# Patient Record
Sex: Male | Born: 1960
Health system: Southern US, Community
[De-identification: ages and names within clinical notes are randomized; demographics above are authoritative.]

## PROBLEM LIST (undated history)

## (undated) DIAGNOSIS — I509 Heart failure, unspecified: Secondary | ICD-10-CM

## (undated) DIAGNOSIS — I2510 Atherosclerotic heart disease of native coronary artery without angina pectoris: Secondary | ICD-10-CM

## (undated) DIAGNOSIS — R739 Hyperglycemia, unspecified: Secondary | ICD-10-CM

## (undated) DIAGNOSIS — I10 Essential (primary) hypertension: Secondary | ICD-10-CM

## (undated) DIAGNOSIS — N182 Chronic kidney disease, stage 2 (mild): Secondary | ICD-10-CM

## (undated) DIAGNOSIS — I82409 Acute embolism and thrombosis of unspecified deep veins of unspecified lower extremity: Secondary | ICD-10-CM

## (undated) DIAGNOSIS — E7800 Pure hypercholesterolemia, unspecified: Secondary | ICD-10-CM

## (undated) HISTORY — DX: Acute embolism and thrombosis of unspecified deep veins of unspecified lower extremity: I82.409

## (undated) HISTORY — DX: Heart failure, unspecified: I50.9

---

## 1979-05-22 DIAGNOSIS — I82409 Acute embolism and thrombosis of unspecified deep veins of unspecified lower extremity: Secondary | ICD-10-CM

## 1979-05-22 HISTORY — DX: Acute embolism and thrombosis of unspecified deep veins of unspecified lower extremity: I82.409

## 1997-09-10 ENCOUNTER — Encounter: Admission: RE | Admit: 1997-09-10 | Discharge: 1997-09-10 | Payer: Self-pay | Admitting: Internal Medicine

## 1998-10-18 ENCOUNTER — Encounter: Admission: RE | Admit: 1998-10-18 | Discharge: 1998-10-18 | Payer: Self-pay | Admitting: Internal Medicine

## 1999-05-11 ENCOUNTER — Encounter: Admission: RE | Admit: 1999-05-11 | Discharge: 1999-05-11 | Payer: Self-pay | Admitting: Internal Medicine

## 2007-04-10 ENCOUNTER — Ambulatory Visit (HOSPITAL_BASED_OUTPATIENT_CLINIC_OR_DEPARTMENT_OTHER): Admission: RE | Admit: 2007-04-10 | Discharge: 2007-04-10 | Payer: Self-pay | Admitting: Orthopedic Surgery

## 2007-04-10 ENCOUNTER — Encounter (INDEPENDENT_AMBULATORY_CARE_PROVIDER_SITE_OTHER): Payer: Self-pay | Admitting: Orthopedic Surgery

## 2007-05-22 HISTORY — PX: KNEE SURGERY: SHX244

## 2009-03-22 ENCOUNTER — Ambulatory Visit: Payer: Self-pay | Admitting: Family Medicine

## 2009-05-19 ENCOUNTER — Ambulatory Visit: Payer: Self-pay | Admitting: Family Medicine

## 2009-05-19 DIAGNOSIS — E785 Hyperlipidemia, unspecified: Secondary | ICD-10-CM | POA: Insufficient documentation

## 2009-05-19 DIAGNOSIS — M2170 Unequal limb length (acquired), unspecified site: Secondary | ICD-10-CM

## 2009-05-19 DIAGNOSIS — F17200 Nicotine dependence, unspecified, uncomplicated: Secondary | ICD-10-CM | POA: Insufficient documentation

## 2009-05-19 DIAGNOSIS — F79 Unspecified intellectual disabilities: Secondary | ICD-10-CM

## 2009-05-19 DIAGNOSIS — F411 Generalized anxiety disorder: Secondary | ICD-10-CM

## 2009-05-19 DIAGNOSIS — I10 Essential (primary) hypertension: Secondary | ICD-10-CM | POA: Insufficient documentation

## 2009-05-19 DIAGNOSIS — F988 Other specified behavioral and emotional disorders with onset usually occurring in childhood and adolescence: Secondary | ICD-10-CM | POA: Insufficient documentation

## 2009-05-19 HISTORY — DX: Nicotine dependence, unspecified, uncomplicated: F17.200

## 2009-05-19 LAB — CONVERTED CEMR LAB
ALT: 30 units/L (ref 0–53)
AST: 28 units/L (ref 0–37)
Albumin: 3.9 g/dL (ref 3.5–5.2)
Eosinophils Relative: 1.5 % (ref 0.0–5.0)
GFR calc non Af Amer: 57.22 mL/min (ref >60)
Glucose, Bld: 103 mg/dL — ABNORMAL HIGH (ref 70–99)
HCT: 46.1 % (ref 39.0–52.0)
Hemoglobin: 15.5 g/dL (ref 13.0–17.0)
Lymphs Abs: 2.3 10*3/uL (ref 0.7–4.0)
Monocytes Relative: 7.4 % (ref 3.0–12.0)
Neutro Abs: 4 10*3/uL (ref 1.4–7.7)
Potassium: 4.4 meq/L (ref 3.5–5.1)
RDW: 12.1 % (ref 11.5–14.6)
Sodium: 140 meq/L (ref 135–145)
TSH: 3.05 microintl units/mL (ref 0.35–5.50)
Total Protein: 7.4 g/dL (ref 6.0–8.3)
VLDL: 23.4 mg/dL (ref 0.0–40.0)
WBC: 7 10*3/uL (ref 4.5–10.5)

## 2009-07-06 ENCOUNTER — Telehealth (INDEPENDENT_AMBULATORY_CARE_PROVIDER_SITE_OTHER): Payer: Self-pay | Admitting: *Deleted

## 2009-07-18 ENCOUNTER — Telehealth (INDEPENDENT_AMBULATORY_CARE_PROVIDER_SITE_OTHER): Payer: Self-pay | Admitting: *Deleted

## 2009-07-18 ENCOUNTER — Encounter (INDEPENDENT_AMBULATORY_CARE_PROVIDER_SITE_OTHER): Payer: Self-pay | Admitting: *Deleted

## 2009-12-26 ENCOUNTER — Encounter (INDEPENDENT_AMBULATORY_CARE_PROVIDER_SITE_OTHER): Payer: Self-pay | Admitting: *Deleted

## 2010-02-16 ENCOUNTER — Ambulatory Visit (HOSPITAL_BASED_OUTPATIENT_CLINIC_OR_DEPARTMENT_OTHER): Admission: RE | Admit: 2010-02-16 | Discharge: 2010-02-16 | Payer: Self-pay | Admitting: Orthopedic Surgery

## 2010-06-20 NOTE — Letter (Signed)
Summary: Discharge Letter  [place] at [facility]  [address] [place], [place] [address]   Phone: [phone]  Fax: [phone]       07/18/2009 MRN: [id number]  [place] [address] [place], [place]  [address]  Dear Mr. Brostrom,   I find it necessary to inform you that I will not be able to provide medical care to you, due to numerous cancelled appointments and inappropriate behavior towards staff.  Delivering timely, professional, and accurate health care is our goal and we are committed to maintaining these standards at our office.  Since your condition requires medical attention and you are dismissed from my personal practice and all physicians at [facility] at [facility], I suggest that you place your self under the care of another physician without delay. If you desire, I will be available for emergency care for 30 days after you receive this letter.  This should give you ample time to select a physician of your choice from the many competent providers in this area. You may want to call the local medical society or [facility]'s physician referral service [phone]) for their assistance in locating a new physician. With your written authorization, I will make a copy of your medical record available to your new physician.   Sincerely,    Laurita Quint, M.D.   Appended Document: Discharge Letter Notifications have been placed in IDX and EMR to reflect Dismissal. Letter sent out by certified mail.  Appended Document: Discharge Letter Receipt returned by USPS verifying delivery of Certified letter. Signed by patient on 07/20/09.

## 2010-06-20 NOTE — Letter (Signed)
Summary: Nadara Eaton letter  [place] at [facility]  [address] [place], [place] [address]   Phone: [phone]  Fax: [phone]       12/26/2009 MRN: [id number]  [place] [address] [place], [place]  [address]  Dear Mr. [facility] [place], and [place] announce the retirement of Arta Silence, M.D., from full-time practice at the [facility] office effective November 17, 2009 and his plans of returning part-time.  It is important to Dr. Hetty Ely and to our practice that you understand that [facility] has seven physicians in our office for your health care needs.  We will continue to offer the same exceptional care that you have today.    Dr. Hetty Ely has spoken to many of you about his plans for retirement and returning part-time in the fall.   We will continue to work with you through the transition to schedule appointments for you in the office and meet the high standards that [place] is committed to.   Again, it is with great pleasure that we share the news that Dr. Hetty Ely will return to [facility] in October of 2011 with a reduced schedule.    If you have any questions, or would like to request an appointment with one of our physicians, please call us at [phone] and press the option for Scheduling an appointment.  We take pleasure in providing you with excellent patient care and look forward to seeing you at your next office visit.  Our [facility] Physicians are:  Tillman Abide, M.D. Laurita Quint, M.D. Roxy Manns, M.D. Kerby Nora, M.D. Hannah Beat, M.D. Ruthe Mannan, M.D. We proudly welcomed Raechel Ache, M.D. and Eustaquio Boyden, M.D. to the practice in July/August 2011.  Sincerely,  [place] [facility]

## 2010-06-20 NOTE — Progress Notes (Signed)
Summary: Dismissal Info  Phone Note Other Incoming   Caller: Clarisa Schools Details for Reason: Pt being dismissed from Ambulance person of Call: Pt was scheduled for appts several times and cancelled.  On one occassion, patient was in the office and was yelling and inappropriate with office staff.  Site manager heard the yelling and the patient being confrontational and stepped out of the back office to speak with the patient.  Patient was removed from front office and scheduling issue was discussed.  Patient was demanding an appointment on a day/time that was not available and it was explained to patient what days/times were available.  Patient's behavior was also inappropriate with the office manager and required extensive explanation of our scheduling guidelines and policy.  Manager talked with physician and offered an appointment and explained that physician stated that the patient could find another physician if this scheduling option was not suitable.   He declined the first available appointment that was offered within the same week due to it not accomodating his schedule and scheduled another appointment time on 1/4.   Patient has since cancelled a total of 3 times and on his most recent cancellation he was demanding and had inappropriate behavior over the telephone with an office staff member and the office team lead.  His behavior continued to escalate and the office team lead had to dismiss the call and reported the behavior to the manager and physician.  The patient called back and demanded his appointment time back.  He claimed he had not called the office and cancelled the appointment and he threatened to come to the office for the appointment time although it had been explained to the patient that the appointment time had already been rescheduled for another patient after he had called earlier and cancelled his original visit time.  Due to the escalated behavior and threats, law enforcement  was called and on site. Protocol has been followed to dismiss the patient from [facility] at [facility].  Initial call taken by: Clarisa Schools,  July 18, 2009 4:30 PM

## 2010-06-20 NOTE — Progress Notes (Signed)
Summary: Pt cancelled appt 3x cpx   Phone Note Call from Patient   Caller: Patient Summary of Call: [name] has scheduled 3 cpx appts and has cancelled each one of them on the same day as the appointments. Pt calls are  always  rude and persistent w/ his request. Pt called on Monday, 07-04-2009, cancelled and cpx appt.  I discussed the no show charge w/ pt, he became very upset. "stated he will go somewhere else before he pay a no show fee".  Pt says the appt was originally scheduede by [employer], pts ins. Appt was rescheduled to 07-06-2009.Marland KitchenPt called today and cancelled again and requested  another appt in April. FYI to Dr. Hetty Ely.Daine Gip  July 06, 2009 12:40 PM  Initial call taken by: Daine Gip,  July 06, 2009 12:40 PM  Follow-up for Phone Call        I will not continue to allow this pt to monopolize my chart and then cancel at the last minute. This pt is no longer welcome in my practice. In addition due to his rude and abusive behavior, he will not be permitted to see anyone else in this office. Follow-up by: Shaune Leeks MD,  July 06, 2009 2:04 PM  Additional Follow-up for Phone Call Additional follow up Details #1::        In the process of Discharging pt per Dr. Hetty Ely request..Daine Gip  July 06, 2009 3:17 PM  Additional Follow-up by: Daine Gip,  July 06, 2009 3:17 PM

## 2010-08-03 LAB — POCT HEMOGLOBIN-HEMACUE: Hemoglobin: 15.2 g/dL (ref 13.0–17.0)

## 2010-08-03 LAB — BASIC METABOLIC PANEL
BUN: 14 mg/dL (ref 6–23)
Creatinine, Ser: 1.22 mg/dL (ref 0.4–1.5)
GFR calc non Af Amer: >60 mL/min (ref >60)

## 2010-10-03 NOTE — Op Note (Signed)
Lonnie Skinner, [place] NO.:  [id number]   MEDICAL RECORD NO.:  [id number]          PATIENT TYPE:  AMB   LOCATION:  DSC                          FACILITY:  [facility]   PHYSICIAN:  Loreta Ave, M.D. DATE OF BIRTH:  [birth year]   DATE OF PROCEDURE:  04/10/2007  DATE OF DISCHARGE:                               OPERATIVE REPORT   PREOPERATIVE DIAGNOSIS:  Left knee anterior meniscal cyst with  underlying lateral meniscus tear.   POSTOPERATIVE DIAGNOSIS:  Left knee anterior meniscal cyst with  underlying lateral meniscus tear with an extremely large extra-articular  cyst extending anterolaterally at the front of the knee and measuring 5-  7 cm in diameter.   PROCEDURE:  Left knee exam under anesthesia, arthroscopy, partial  lateral meniscectomy, open and arthroscopic excision of the meniscal  cyst.   SURGEON:  Loreta Ave, M.D.   ASSISTANT:  Genene Churn. Barry Dienes, P.A.-C.   ANESTHESIA:  General   BLOOD LOSS:  Minimal.   TOURNIQUET TIME:  45 minutes.   SPECIMENS:  None.   CULTURES:  None.   COMPLICATIONS:  None.   DRESSING:  Soft compressive.   DESCRIPTION OF PROCEDURE:  The patient was brought to the operating room  and placed on the operating table in a supine position. After adequate  anesthesia had been obtained, left knee examined.  Full motion with full  extension and full flexion.  There was a very large very palpable cyst  just adjacent to the tibial tubercle off the front of the anterolateral  joint line.  This extended well down over the front of the tibia.  Attention was first turned to the extra-articular component of the cyst.  A longitudinal incision right over the cyst.  Skin and subcutaneous  tissue divided.  Care taken to avoid the anterior compartment and  peroneal nerve.  The cyst was a dumbbell shaped cyst extending out from  the front of the lateral compartment going distally and then wrapping  around the margin of the patellar tendon  deep and superficially.  This  was identified, excised in its entirety, traveling all the way down to  the anterolateral aspect of the knee where it communicated to the front  of the lateral meniscus.  After the entire portion of that cyst was  excised, I made three portals in the knee, one superolateral and one  each medial and lateral parapatellar.  Inflow catheter induced, the knee  distended, the arthroscope induced, the knee inspected.  Articular  cartilage intact patellofemoral joint.  Some grade 2 changes medial  component.  Medial meniscus intact.  Cruciate ligaments intact.  Anterior horn medial meniscus absolutely normal.  Anterolateral meniscus  extensive complex tearing with still some remnants of the cyst within  the knee itself.  There was really nothing salvageable over the entire  anterior half of the lateral meniscus.  Entire anterior half removed,  tapered to meniscus, salvaging the posterior half.  I could put a spinal  needle into the cyst excision directly into this site.  Care was taken  to remove all of the  remnants of the cyst both intra and extra-  articular.  The entire knee examined and no other the findings  appreciated.  Instruments and fluid removed.  I then went back to the  incision and did a closure with Vicryl of the opening where the cyst had  come out anterolaterally.  When that was complete, the portals were  closed with nylon.  The incision was closed with subcutaneous and  subcuticular Vicryl.  Steri-Strips applied.  Sterile compressive  dressing applied.  Tourniquet deflated and removed.  Anesthesia  reversed.  Brought to the recovery room. Tolerated the surgery well, no  complications.      Loreta Ave, M.D.  Electronically Signed     DFM/MEDQ  D:  04/10/2007  T:  04/10/2007  Job:  [id number]

## 2011-02-27 LAB — BASIC METABOLIC PANEL
BUN: 20
Creatinine, Ser: 1.31
GFR calc non Af Amer: 59 — ABNORMAL LOW

## 2011-02-27 LAB — POCT HEMOGLOBIN-HEMACUE: Operator id: 12362

## 2013-05-21 HISTORY — PX: CORONARY STENT PLACEMENT: SHX1402

## 2013-05-21 HISTORY — PX: CORONARY/GRAFT ACUTE MI REVASCULARIZATION: CATH118305

## 2013-06-01 ENCOUNTER — Emergency Department (HOSPITAL_COMMUNITY): Payer: Medicare Other

## 2013-06-01 ENCOUNTER — Encounter (HOSPITAL_COMMUNITY): Payer: Self-pay | Admitting: Emergency Medicine

## 2013-06-01 ENCOUNTER — Inpatient Hospital Stay (HOSPITAL_COMMUNITY)
Admission: EM | Admit: 2013-06-01 | Discharge: 2013-06-03 | DRG: 247 | Disposition: A | Discharge status: Home / Self Care | Payer: Medicare Other | Attending: Internal Medicine | Admitting: Internal Medicine

## 2013-06-01 DIAGNOSIS — N183 Chronic kidney disease, stage 3 unspecified: Secondary | ICD-10-CM | POA: Diagnosis present

## 2013-06-01 DIAGNOSIS — I214 Non-ST elevation (NSTEMI) myocardial infarction: Principal | ICD-10-CM

## 2013-06-01 DIAGNOSIS — I2510 Atherosclerotic heart disease of native coronary artery without angina pectoris: Secondary | ICD-10-CM

## 2013-06-01 DIAGNOSIS — N182 Chronic kidney disease, stage 2 (mild): Secondary | ICD-10-CM

## 2013-06-01 DIAGNOSIS — E785 Hyperlipidemia, unspecified: Secondary | ICD-10-CM | POA: Diagnosis present

## 2013-06-01 DIAGNOSIS — Z955 Presence of coronary angioplasty implant and graft: Secondary | ICD-10-CM

## 2013-06-01 DIAGNOSIS — R079 Chest pain, unspecified: Secondary | ICD-10-CM

## 2013-06-01 DIAGNOSIS — Z7982 Long term (current) use of aspirin: Secondary | ICD-10-CM

## 2013-06-01 DIAGNOSIS — Z8249 Family history of ischemic heart disease and other diseases of the circulatory system: Secondary | ICD-10-CM

## 2013-06-01 DIAGNOSIS — E7800 Pure hypercholesterolemia, unspecified: Secondary | ICD-10-CM | POA: Diagnosis present

## 2013-06-01 DIAGNOSIS — R011 Cardiac murmur, unspecified: Secondary | ICD-10-CM | POA: Diagnosis present

## 2013-06-01 DIAGNOSIS — E876 Hypokalemia: Secondary | ICD-10-CM | POA: Diagnosis not present

## 2013-06-01 DIAGNOSIS — I2119 ST elevation (STEMI) myocardial infarction involving other coronary artery of inferior wall: Secondary | ICD-10-CM

## 2013-06-01 DIAGNOSIS — R7309 Other abnormal glucose: Secondary | ICD-10-CM | POA: Diagnosis present

## 2013-06-01 DIAGNOSIS — Z87891 Personal history of nicotine dependence: Secondary | ICD-10-CM

## 2013-06-01 DIAGNOSIS — F411 Generalized anxiety disorder: Secondary | ICD-10-CM | POA: Diagnosis present

## 2013-06-01 DIAGNOSIS — I10 Essential (primary) hypertension: Secondary | ICD-10-CM | POA: Diagnosis present

## 2013-06-01 DIAGNOSIS — I129 Hypertensive chronic kidney disease with stage 1 through stage 4 chronic kidney disease, or unspecified chronic kidney disease: Secondary | ICD-10-CM | POA: Diagnosis present

## 2013-06-01 DIAGNOSIS — F17200 Nicotine dependence, unspecified, uncomplicated: Secondary | ICD-10-CM | POA: Diagnosis present

## 2013-06-01 HISTORY — DX: Atherosclerotic heart disease of native coronary artery without angina pectoris: I25.10

## 2013-06-01 HISTORY — DX: Pure hypercholesterolemia, unspecified: E78.00

## 2013-06-01 HISTORY — DX: Essential (primary) hypertension: I10

## 2013-06-01 HISTORY — DX: Hyperglycemia, unspecified: R73.9

## 2013-06-01 HISTORY — DX: Chronic kidney disease, stage 2 (mild): N18.2

## 2013-06-01 LAB — PROTIME-INR
INR: 1.05 (ref 0.00–1.49)
PROTHROMBIN TIME: 13.5 s (ref 11.6–15.2)

## 2013-06-01 LAB — BASIC METABOLIC PANEL
BUN: 15 mg/dL (ref 6–23)
CO2: 26 mEq/L (ref 19–32)
Calcium: 9.3 mg/dL (ref 8.4–10.5)
Chloride: 100 mEq/L (ref 96–112)
Creatinine, Ser: 1.33 mg/dL (ref 0.50–1.35)
GFR calc Af Amer: 69 mL/min — ABNORMAL LOW (ref >90)
GFR calc non Af Amer: 60 mL/min — ABNORMAL LOW (ref >90)
Glucose, Bld: 120 mg/dL — ABNORMAL HIGH (ref 70–99)
Potassium: 3.5 mEq/L — ABNORMAL LOW (ref 3.7–5.3)
Sodium: 140 mEq/L (ref 137–147)

## 2013-06-01 LAB — URINALYSIS, ROUTINE W REFLEX MICROSCOPIC
Bilirubin Urine: NEGATIVE
GLUCOSE, UA: NEGATIVE mg/dL
HGB URINE DIPSTICK: NEGATIVE
KETONES UR: NEGATIVE mg/dL
LEUKOCYTES UA: NEGATIVE
Nitrite: NEGATIVE
PH: 7.5 (ref 5.0–8.0)
PROTEIN: NEGATIVE mg/dL
Specific Gravity, Urine: 1.013 (ref 1.005–1.030)
Urobilinogen, UA: 0.2 mg/dL (ref 0.0–1.0)

## 2013-06-01 LAB — CBC
HCT: 45.6 % (ref 39.0–52.0)
Hemoglobin: 16.5 g/dL (ref 13.0–17.0)
MCH: 31.6 pg (ref 26.0–34.0)
MCHC: 36.2 g/dL — ABNORMAL HIGH (ref 30.0–36.0)
MCV: 87.4 fL (ref 78.0–100.0)
Platelets: 256 10*3/uL (ref 150–400)
RBC: 5.22 MIL/uL (ref 4.22–5.81)
RDW: 13.1 % (ref 11.5–15.5)
WBC: 9.7 10*3/uL (ref 4.0–10.5)

## 2013-06-01 LAB — POCT I-STAT TROPONIN I: Troponin i, poc: 0.5 ng/mL (ref 0.00–0.08)

## 2013-06-01 LAB — TROPONIN I: Troponin I: 0.83 ng/mL (ref <0.30)

## 2013-06-01 LAB — APTT: aPTT: 28 seconds (ref 24–37)

## 2013-06-01 MED ORDER — POTASSIUM CHLORIDE CRYS ER 20 MEQ PO TBCR
40.0000 meq | EXTENDED_RELEASE_TABLET | Freq: Once | ORAL | Status: AC
Start: 2013-06-01 — End: 2013-06-01
  Administered 2013-06-01: 40 meq via ORAL
  Filled 2013-06-01: qty 2

## 2013-06-01 MED ORDER — HEPARIN BOLUS VIA INFUSION
4000.0000 [IU] | Freq: Once | INTRAVENOUS | Status: AC
  Administered 2013-06-01 (×2): 4000 [IU] via INTRAVENOUS
  Filled 2013-06-01: qty 4000

## 2013-06-01 MED ORDER — HEPARIN (PORCINE) IN NACL 100-0.45 UNIT/ML-% IJ SOLN
1100.0000 [IU]/h | INTRAMUSCULAR | Status: DC
  Administered 2013-06-01: 1100 [IU]/h via INTRAVENOUS
  Filled 2013-06-01 (×2): qty 250

## 2013-06-01 MED ORDER — ASPIRIN 81 MG PO CHEW
324.0000 mg | CHEWABLE_TABLET | Freq: Once | ORAL | Status: AC
  Administered 2013-06-01: 324 mg via ORAL
  Filled 2013-06-01: qty 4

## 2013-06-01 NOTE — ED Provider Notes (Signed)
CSN: [id number]     Arrival date & time 06/01/13  1704 History   First MD Initiated Contact with Patient 06/01/13 2026     Chief Complaint  Patient presents with  . Chest Pain   (Consider location/radiation/quality/duration/timing/severity/associated sxs/prior Treatment) HPI Lonnie Skinner is a 53 y.o. male who presents emergency department complaining of chest pain. Patient states that he has had intermittent chest pain for the last 5 days. He states he only feels the pain when he goes outside to the mailbox. He admits that he thinks that the cold air is when making his pain worse. He denies walking longer distances than to the mailbox the last 5 days so he is not sure if walking a certain distance makes his pain worse. He denies going anywhere out of the house recently. He states he has had associated shortness of breath with this pain but denies any dizziness, nausea, diaphoresis. Patient states that he has history of hypertension and hypercholesterolemia but he does not take his medications as prescribed. He states 4 days ago when he had chest pressure and it measured his blood pressure and was elevated so he decided to restart his medications then. [facility]. He states he had a stress test done but that was multiple years ago. He denies any current chest pain. He is unable to recall how long the pain lasts. He states it feels like pressure in her center chest. Pt states "I am not sure what it is, but something is just not right."   Past Medical History  Diagnosis Date  . Hypertension   . Hypercholesteremia    History reviewed. No pertinent past surgical history. History reviewed. No pertinent family history. History  Substance Use Topics  . Smoking status: Never Smoker   . Smokeless tobacco: Not on file  . Alcohol Use: No    Review of Systems  Constitutional: Negative for fever and chills.  Respiratory: Positive for chest  tightness and shortness of breath. Negative for cough.   Cardiovascular: Positive for chest pain. Negative for palpitations and leg swelling.  Gastrointestinal: Negative for nausea, vomiting, abdominal pain, diarrhea and abdominal distention.  Genitourinary: Negative for dysuria, urgency, frequency and hematuria.  Musculoskeletal: Negative for arthralgias, myalgias, neck pain and neck stiffness.  Skin: Negative for rash.  Allergic/Immunologic: Negative for immunocompromised state.  Neurological: Negative for dizziness, weakness, light-headedness, numbness and headaches.    Allergies  Review of patient's allergies indicates no known allergies.  Home Medications   Current Outpatient Rx  Name  Route  Sig  Dispense  Refill  . atenolol (TENORMIN) 50 MG tablet   Oral   Take 50 mg by mouth daily.         Marland Kitchen gemfibrozil (LOPID) 600 MG tablet   Oral   Take 600 mg by mouth 2 (two) times daily before a meal.         . hydrochlorothiazide (HYDRODIURIL) 25 MG tablet   Oral   Take 25 mg by mouth daily.          BP 163/97  Pulse 77  Temp(Src) 97.5 F (36.4 C) (Oral)  Resp 16  SpO2 98% Physical Exam  Nursing note and vitals reviewed. Constitutional: He is oriented to person, place, and time. He appears well-developed and well-nourished. No distress.  HENT:  Head: Normocephalic and atraumatic.  Eyes: Conjunctivae are normal.  Neck: Neck supple.  Cardiovascular: Normal rate, regular rhythm and normal  heart sounds.   Pulmonary/Chest: Effort normal. No respiratory distress. He has no wheezes. He has no rales.  Abdominal: Soft. Bowel sounds are normal. He exhibits no distension. There is no tenderness. There is no rebound.  Musculoskeletal: He exhibits no edema.  Neurological: He is alert and oriented to person, place, and time.  Skin: Skin is warm and dry.    ED Course  Procedures (including critical care time) Labs Review Labs Reviewed  CBC - Abnormal; Notable for the  following:    MCHC 36.2 (*)    All other components within normal limits  BASIC METABOLIC PANEL - Abnormal; Notable for the following:    Potassium 3.5 (*)    Glucose, Bld 120 (*)    GFR calc non Af Amer 60 (*)    GFR calc Af Amer 69 (*)    All other components within normal limits  POCT I-STAT TROPONIN I - Abnormal; Notable for the following:    Troponin i, poc 0.50 (*)    All other components within normal limits  TROPONIN I  PROTIME-INR  APTT   Imaging Review Dg Chest 2 View  06/01/2013   CLINICAL DATA:  Chest pain  EXAM: CHEST  2 VIEW  COMPARISON:  None available  FINDINGS: The cardiac and mediastinal silhouettes are within normal limits.  The lungs are normally inflated. A few scattered linear opacity seen within the lingula are most consistent with atelectasis. No airspace consolidation, pleural effusion, or pulmonary edema is identified. There is no pneumothorax.  No acute osseous abnormality identified.  IMPRESSION: Mild lingular atelectasis.  No acute cardiopulmonary process.   Electronically Signed   By: Jeannine Boga M.D.   On: 06/01/2013 18:57    EKG Interpretation   None       MDM   1. Chest pain     9:12 PM Troponin 0.5. Pt is chest pain free. Aspirin ordered, 325mg . Started on heparin bolus and drip per pharmacy.   Discussed with cardiology, will come by and see. Repeat main lab troponin sent.   Filed Vitals:   06/01/13 1713 06/01/13 2045 06/01/13 2058 06/01/13 2100  BP: 163/97 148/99 148/99 151/89  Pulse: 77 65 66 63  Temp: 97.5 F (36.4 C)     TempSrc: Oral     Resp: 16 18 20 15   SpO2: 98% 99% 99% 98%      Renold Genta, PA-C 06/01/13 2309

## 2013-06-01 NOTE — H&P (Signed)
History and Physical  Patient ID: Lonnie Skinner MRN: [id number], DOB: 01-19-1961 Date of Encounter: 06/01/2013, 10:01 PM Primary Physician: No primary provider on file. Primary Cardiologist: Nill   Chief Complaint: Precodial pain  Reason for Admission: Precordial pain   HPI:52 yr old Male with hx of HLD , HTN , FH of CAD, anxiety  here with new onset precordial chest pain   Pt states that about 2 days ago when he was walking in the yard when it was cold he noticed chest pain . He describes this as substernal pressure non radiating that resolved on its own with rest. Today it recurred with exertion in the yard when he was carrying trash outside and decided to come in.  Pt denies SOB , orthopnea, PND , LE edema , Syncope ,claudcation , focal weakness, or bleeding diathesis .  No prior cardiac workup  Is a Veteran and follows at the [place]  Denies smoking, not active at baseline  Not fully compliant with his BP and cholesterol medications  States that both mother and father had heart disease but does not know what age they were dx with CAD      Past Medical History  Diagnosis Date  . Hypertension   . Hypercholesteremia      Surgical History: History reviewed. No pertinent past surgical history.   Home Meds: Prior to Admission medications   Medication Sig Start Date End Date Taking? Authorizing Provider  atenolol (TENORMIN) 50 MG tablet Take 50 mg by mouth daily.   Yes Historical Provider, MD  gemfibrozil (LOPID) 600 MG tablet Take 600 mg by mouth 2 (two) times daily before a meal.   Yes Historical Provider, MD  hydrochlorothiazide (HYDRODIURIL) 25 MG tablet Take 25 mg by mouth daily.   Yes Historical Provider, MD    Allergies: No Known Allergies  History   Social History  . Marital Status: Married    Spouse Name: N/A    Number of Children: N/A  . Years of Education: N/A   Occupational History  . Not on file.   Social History Main Topics  . Smoking status: Never Smoker    . Smokeless tobacco: Not on file  . Alcohol Use: No  . Drug Use: No  . Sexual Activity: Not on file   Other Topics Concern  . Not on file   Social History Narrative  . No narrative on file     History reviewed. No pertinent family history.  Review of Systems:per HPI  Labs:   Lab Results  Component Value Date   WBC 9.7 06/01/2013   HGB 16.5 06/01/2013   HCT 45.6 06/01/2013   MCV 87.4 06/01/2013   PLT 256 06/01/2013    Recent Labs Lab 06/01/13 1718  NA 140  K 3.5*  CL 100  CO2 26  BUN 15  CREATININE 1.33  CALCIUM 9.3  GLUCOSE 120*    Recent Labs  06/01/13 2052  TROPONINI 0.83*   Lab Results  Component Value Date   CHOL 163 05/19/2009   HDL 36.90* 05/19/2009   LDLCALC 103* 05/19/2009   TRIG 117.0 05/19/2009   No results found for this basename: DDIMER    Radiology/Studies:  Dg Chest 2 View  06/01/2013   CLINICAL DATA:  Chest pain  EXAM: CHEST  2 VIEW  COMPARISON:  None available  FINDINGS: The cardiac and mediastinal silhouettes are within normal limits.  The lungs are normally inflated. A few scattered linear opacity seen within the lingula are  most consistent with atelectasis. No airspace consolidation, pleural effusion, or pulmonary edema is identified. There is no pneumothorax.  No acute osseous abnormality identified.  IMPRESSION: Mild lingular atelectasis.  No acute cardiopulmonary process.   Electronically Signed   By: Jeannine Boga M.D.   On: 06/01/2013 18:57     EKG:  NSR, half mm ST depression in V4-V6   Physical Exam: Blood pressure 151/89, pulse 63, temperature 97.5 F (36.4 C), temperature source Oral, resp. rate 15, SpO2 98.00%. General: Well developed, well nourished, in no acute distress. Head: Normocephalic, atraumatic, sclera non-icteric, no xanthomas, nares are without discharge.  Neck: Negative for carotid bruits. JVD not elevated. Lungs: Clear bilaterally to auscultation without wheezes, rales, or rhonchi. Breathing is  unlabored. Heart: RRR with S1 S2. No murmurs, rubs, or gallops appreciated. Abdomen: Soft, non-tender, non-distended with normoactive bowel sounds. No hepatomegaly. No rebound/guarding. No obvious abdominal masses. Msk:  Strength and tone appear normal for age. Extremities: No clubbing or cyanosis. No edema.  Distal pedal pulses are 2+ and equal bilaterally. Neuro: Alert and oriented X 3. No focal deficit. No facial asymmetry. Moves all extremities spontaneously. Psych:  Appears anxious     ASSESSMENT AND PLAN:  NSTE- ACS HTN  HLD  Hypokalemia   Plan  Anticoagulate , start Aspirin , statin , b-blocker  Trend CE , admit to Tele  Give 40PO potassium  Check Hg A1c and lipids  Likely LHC in am  Keep NPO overnight    Signed, Arnoldo Lenis, A M.D  06/01/2013, 10:01 PM

## 2013-06-01 NOTE — ED Notes (Signed)
Troponin results reported to Nurse Calais and Dr.Kohut

## 2013-06-01 NOTE — ED Notes (Addendum)
Presents with intermittent chest pain began today while outside, pain only occurs while outside, located in left side of chest described as pressure, does not radiate, denies SOB, denies nausea. Rest makes pain better, exertion makes pain worse. Denies pain at this time, pain lasted a few minutes.

## 2013-06-01 NOTE — Progress Notes (Signed)
ANTICOAGULATION CONSULT NOTE - Initial Consult  Pharmacy Consult for heparin Indication: chest pain/ACS  No Known Allergies  Patient Measurements:   Heparin Dosing Weight: 85.1kg  Vital Signs: Temp: 97.5 F (36.4 C) (01/12 1713) Temp src: Oral (01/12 1713) BP: 163/97 mmHg (01/12 1713) Pulse Rate: 77 (01/12 1713)  Labs:  Recent Labs  06/01/13 1718 06/01/13 1840  HGB  --  16.5  HCT  --  45.6  PLT  --  256  CREATININE 1.33  --     CrCl is unknown because there is no height on file for the current visit.   Medical History: Past Medical History  Diagnosis Date  . Hypertension   . Hypercholesteremia     Medications:  Infusions:  . heparin    . heparin      Assessment: 76 yom presented to the ED with CP. Troponin is elevated. To start IV heparin for anticoagulation. CBC is WNL. He is not taking any blood thinners PTA.   Goal of Therapy:  Heparin level 0.3-0.7 units/ml Monitor platelets by anticoagulation protocol: Yes   Plan:  1. Heparin bolus 4000 units IV x 1 2. Heparin gtt 1100 units/hr 3. Check a 6 hour heparin level 4. Daily heparin level and CBC  Lenward Able, Rande Lawman 06/01/2013,8:47 PM

## 2013-06-02 ENCOUNTER — Encounter (HOSPITAL_COMMUNITY): Payer: Self-pay | Admitting: Interventional Cardiology

## 2013-06-02 ENCOUNTER — Encounter (HOSPITAL_COMMUNITY)
Admission: EM | Disposition: A | Discharge status: Home / Self Care | Payer: Medicare Other | Source: Home / Self Care | Attending: Internal Medicine

## 2013-06-02 ENCOUNTER — Ambulatory Visit (HOSPITAL_COMMUNITY): Admit: 2013-06-02 | Payer: Medicare Other | Admitting: Interventional Cardiology

## 2013-06-02 DIAGNOSIS — I2119 ST elevation (STEMI) myocardial infarction involving other coronary artery of inferior wall: Secondary | ICD-10-CM | POA: Insufficient documentation

## 2013-06-02 DIAGNOSIS — E785 Hyperlipidemia, unspecified: Secondary | ICD-10-CM

## 2013-06-02 DIAGNOSIS — I10 Essential (primary) hypertension: Secondary | ICD-10-CM

## 2013-06-02 DIAGNOSIS — I214 Non-ST elevation (NSTEMI) myocardial infarction: Secondary | ICD-10-CM

## 2013-06-02 DIAGNOSIS — Z87891 Personal history of nicotine dependence: Secondary | ICD-10-CM

## 2013-06-02 DIAGNOSIS — I2510 Atherosclerotic heart disease of native coronary artery without angina pectoris: Secondary | ICD-10-CM

## 2013-06-02 HISTORY — PX: LEFT HEART CATHETERIZATION WITH CORONARY ANGIOGRAM: SHX5451

## 2013-06-02 HISTORY — PX: PERCUTANEOUS CORONARY STENT INTERVENTION (PCI-S): SHX5485

## 2013-06-02 LAB — TROPONIN I
Troponin I: 1.11 ng/mL (ref <0.30)
Troponin I: 0.8 ng/mL (ref <0.30)
Troponin I: 0.54 ng/mL (ref <0.30)

## 2013-06-02 LAB — CBC
HCT: 43 % (ref 39.0–52.0)
Hemoglobin: 15.6 g/dL (ref 13.0–17.0)
MCH: 30.8 pg (ref 26.0–34.0)
MCHC: 36.3 g/dL — ABNORMAL HIGH (ref 30.0–36.0)
MCV: 84.8 fL (ref 78.0–100.0)
Platelets: 219 10*3/uL (ref 150–400)
RBC: 5.07 MIL/uL (ref 4.22–5.81)
RDW: 13.2 % (ref 11.5–15.5)
WBC: 8.4 10*3/uL (ref 4.0–10.5)

## 2013-06-02 LAB — COMPREHENSIVE METABOLIC PANEL
ALBUMIN: 3.7 g/dL (ref 3.5–5.2)
ALK PHOS: 93 U/L (ref 39–117)
ALT: 27 U/L (ref 0–53)
AST: 24 U/L (ref 0–37)
BILIRUBIN TOTAL: 0.6 mg/dL (ref 0.3–1.2)
BUN: 14 mg/dL (ref 6–23)
CO2: 29 meq/L (ref 19–32)
CREATININE: 1.38 mg/dL — AB (ref 0.50–1.35)
Calcium: 9.1 mg/dL (ref 8.4–10.5)
Chloride: 100 mEq/L (ref 96–112)
GFR calc Af Amer: 66 mL/min — ABNORMAL LOW (ref >90)
GFR calc non Af Amer: 57 mL/min — ABNORMAL LOW (ref >90)
Glucose, Bld: 110 mg/dL — ABNORMAL HIGH (ref 70–99)
Potassium: 4 mEq/L (ref 3.7–5.3)
Sodium: 140 mEq/L (ref 137–147)
Total Protein: 6.9 g/dL (ref 6.0–8.3)

## 2013-06-02 LAB — HEPARIN LEVEL (UNFRACTIONATED): Heparin Unfractionated: 0.35 IU/mL (ref 0.30–0.70)

## 2013-06-02 SURGERY — LEFT HEART CATHETERIZATION WITH CORONARY ANGIOGRAM
Anesthesia: LOCAL

## 2013-06-02 MED ORDER — TIROFIBAN HCL IV 5 MG/100ML
INTRAVENOUS | Status: AC
  Filled 2013-06-02: qty 100

## 2013-06-02 MED ORDER — TICAGRELOR 90 MG PO TABS
ORAL_TABLET | ORAL | Status: AC
  Filled 2013-06-02: qty 1

## 2013-06-02 MED ORDER — LORAZEPAM 2 MG/ML IJ SOLN
0.5000 mg | Freq: Once | INTRAMUSCULAR | Status: AC
  Administered 2013-06-02: 0.5 mg via INTRAVENOUS
  Filled 2013-06-02: qty 1

## 2013-06-02 MED ORDER — MIDAZOLAM HCL 2 MG/2ML IJ SOLN
INTRAMUSCULAR | Status: AC
  Filled 2013-06-02: qty 2

## 2013-06-02 MED ORDER — BIVALIRUDIN 250 MG IV SOLR
INTRAVENOUS | Status: AC
  Filled 2013-06-02: qty 250

## 2013-06-02 MED ORDER — SODIUM CHLORIDE 0.9 % IJ SOLN: 3.0000 mL | INTRAMUSCULAR | Status: DC | PRN

## 2013-06-02 MED ORDER — ASPIRIN 300 MG RE SUPP: 300.0000 mg | RECTAL | Status: DC

## 2013-06-02 MED ORDER — INFLUENZA VAC SPLIT QUAD 0.5 ML IM SUSP
0.5000 mL | INTRAMUSCULAR | Status: AC
  Administered 2013-06-03: 0.5 mL via INTRAMUSCULAR
  Filled 2013-06-02: qty 0.5

## 2013-06-02 MED ORDER — HEPARIN (PORCINE) IN NACL 2-0.9 UNIT/ML-% IJ SOLN
INTRAMUSCULAR | Status: AC
  Filled 2013-06-02: qty 500

## 2013-06-02 MED ORDER — FENTANYL CITRATE 0.05 MG/ML IJ SOLN
INTRAMUSCULAR | Status: AC
  Filled 2013-06-02: qty 2

## 2013-06-02 MED ORDER — SODIUM CHLORIDE 0.9 % IJ SOLN: 3.0000 mL | Freq: Two times a day (BID) | INTRAMUSCULAR | Status: DC

## 2013-06-02 MED ORDER — NITROGLYCERIN 0.4 MG SL SUBL: 0.4000 mg | SUBLINGUAL_TABLET | SUBLINGUAL | Status: DC | PRN

## 2013-06-02 MED ORDER — SODIUM CHLORIDE 0.9 % IV SOLN: 250.0000 mL | INTRAVENOUS | Status: DC | PRN

## 2013-06-02 MED ORDER — ATENOLOL 50 MG PO TABS
50.0000 mg | ORAL_TABLET | Freq: Every day | ORAL | Status: DC
  Administered 2013-06-02: 50 mg via ORAL
  Filled 2013-06-02 (×2): qty 1

## 2013-06-02 MED ORDER — PRASUGREL HCL 10 MG PO TABS
10.0000 mg | ORAL_TABLET | Freq: Every day | ORAL | Status: DC
  Administered 2013-06-03: 10 mg via ORAL
  Filled 2013-06-02: qty 1

## 2013-06-02 MED ORDER — NITROGLYCERIN 0.2 MG/ML ON CALL CATH LAB
INTRAVENOUS | Status: AC
  Filled 2013-06-02: qty 1

## 2013-06-02 MED ORDER — VERAPAMIL HCL 2.5 MG/ML IV SOLN
INTRAVENOUS | Status: AC
  Filled 2013-06-02: qty 2

## 2013-06-02 MED ORDER — ASPIRIN EC 81 MG PO TBEC
81.0000 mg | DELAYED_RELEASE_TABLET | Freq: Every day | ORAL | Status: DC
  Administered 2013-06-02 – 2013-06-03 (×2): 81 mg via ORAL
  Filled 2013-06-02 (×2): qty 1

## 2013-06-02 MED ORDER — ASPIRIN 81 MG PO CHEW: 81.0000 mg | CHEWABLE_TABLET | Freq: Every day | ORAL | Status: DC

## 2013-06-02 MED ORDER — ACETAMINOPHEN 325 MG PO TABS: 650.0000 mg | ORAL_TABLET | ORAL | Status: DC | PRN

## 2013-06-02 MED ORDER — ONDANSETRON HCL 4 MG/2ML IJ SOLN
4.0000 mg | Freq: Four times a day (QID) | INTRAMUSCULAR | Status: DC | PRN
Start: 2013-06-02 — End: 2013-06-03
  Administered 2013-06-02: 4 mg via INTRAVENOUS
  Filled 2013-06-02: qty 2

## 2013-06-02 MED ORDER — SODIUM CHLORIDE 0.9 % IV SOLN: 1.0000 mL/kg/h | INTRAVENOUS | Status: DC

## 2013-06-02 MED ORDER — HEPARIN SODIUM (PORCINE) 1000 UNIT/ML IJ SOLN
INTRAMUSCULAR | Status: AC
  Filled 2013-06-02: qty 1

## 2013-06-02 MED ORDER — SIMVASTATIN 40 MG PO TABS
40.0000 mg | ORAL_TABLET | Freq: Every day | ORAL | Status: DC
  Administered 2013-06-02: 40 mg via ORAL
  Filled 2013-06-02 (×2): qty 1

## 2013-06-02 MED ORDER — ONDANSETRON HCL 4 MG/2ML IJ SOLN: 4.0000 mg | Freq: Four times a day (QID) | INTRAMUSCULAR | Status: DC | PRN

## 2013-06-02 MED ORDER — SODIUM CHLORIDE 0.9 % IJ SOLN
3.0000 mL | INTRAMUSCULAR | Status: DC | PRN
Start: 2013-06-02 — End: 2013-06-02

## 2013-06-02 MED ORDER — HYDROCHLOROTHIAZIDE 25 MG PO TABS
25.0000 mg | ORAL_TABLET | Freq: Every day | ORAL | Status: DC
  Administered 2013-06-02: 25 mg via ORAL
  Filled 2013-06-02 (×2): qty 1

## 2013-06-02 MED ORDER — SODIUM CHLORIDE 0.9 % IV SOLN
1.0000 mL/kg/h | INTRAVENOUS | Status: AC
  Administered 2013-06-02: 22:00:00 1 mL/kg/h via INTRAVENOUS

## 2013-06-02 MED ORDER — ASPIRIN 81 MG PO CHEW: 324.0000 mg | CHEWABLE_TABLET | ORAL | Status: DC

## 2013-06-02 NOTE — Progress Notes (Signed)
ANTICOAGULATION CONSULT NOTE   Pharmacy Consult for heparin Indication: chest pain/ACS  No Known Allergies  Patient Measurements: Height: 5\' 7"  (170.2 cm) Weight: 213 lb 11.2 oz (96.934 kg) IBW/kg (Calculated) : 66.1 Heparin Dosing Weight: 85.1kg  Vital Signs: Temp: 97.4 F (36.3 C) (01/13 0502) Temp src: Oral (01/13 0502) BP: 111/69 mmHg (01/13 0502) Pulse Rate: 59 (01/13 0502)  Labs:  Recent Labs  06/01/13 1718 06/01/13 1840 06/01/13 2052 06/02/13 0050 06/02/13 0336  HGB  --  16.5  --   --   --   HCT  --  45.6  --   --   --   PLT  --  256  --   --   --   APTT  --   --  28  --   --   LABPROT  --   --  13.5  --   --   INR  --   --  1.05  --   --   HEPARINUNFRC  --   --   --   --  0.35  CREATININE 1.33  --   --   --  1.38*  TROPONINI  --   --  0.83* 1.11*  --     Estimated Creatinine Clearance: 68.6 ml/min (by C-G formula based on Cr of 1.38).  Assessment: 53 y.o. male with chest pain for heparin  Goal of Therapy:  Heparin level 0.3-0.7 units/ml Monitor platelets by anticoagulation protocol: Yes   Plan:  Continue Heparin at current rate    Jaysiah Marchetta, Bronson Curb 06/02/2013,5:39 AM

## 2013-06-02 NOTE — Progress Notes (Signed)
Utilization review completed.  

## 2013-06-02 NOTE — CV Procedure (Signed)
PROCEDURE:  Left heart catheterization with selective coronary angiography.  PCI RCA.  INDICATIONS:  NSTEMI  The risks, benefits, and details of the procedure were explained to the patient.  The patient verbalized understanding and wanted to proceed.  Informed written consent was obtained.  PROCEDURE TECHNIQUE:  After Xylocaine anesthesia a 37F slender sheath was placed in the right radial artery with a single anterior needle wall stick.   Right coronary angiography was done using a Judkins R4 guide catheter.  Left coronary angiography was done using a Judkins L3.5 guide catheter.  The circumflex was selectively engage. We attempted using an EBU 3.0 guiding catheter. This was not long enough to reach. The JL 3.0 guide catheter then did selectively engage the LAD. It appeared there is no left main. Left heart catheterization was done using a pigtail catheter.  A TR band was used for hemostasis.   CONTRAST:  Total of 150 cc.  COMPLICATIONS:  None.    HEMODYNAMICS:  Aortic pressure was 121/77; LV pressure was 117/8; LVEDP 13.  There was no gradient between the left ventricle and aorta.    ANGIOGRAPHIC DATA:   The left main coronary artery is absent. There appear to be separate ostia of the LAD and circumflex.  The left anterior descending artery is a large vessel which reaches the apex. In the mid vessel, there is a 50-70% lesion in the mid LAD. There is mild to moderate diffuse disease. There 2 large diagonals which are widely patent. The apical LAD is small but patent.  The left circumflex artery is a medium size vessel. There appears to be some vasospasm proximally. There is a large first marginal which is patent. The second marginal is a larger vessel which branches across the lateral wall. This appears widely patent.  The right coronary artery is a large dominant vessel. In the mid vessel, there is a focal 99% stenosis with visible thrombus. The posterior lateral artery is very large  and widely patent. The posterior descending artery is widely patent.    LEFT VENTRICULOGRAM:  Left ventricular angiogram was not done.  LVEDP was 13 mmHg.  PCI NARRATIVE: A JR 4 guiding catheters u used to engage the RCA. Angiomax was used for anticoagulation. Initially, a pro-water wire was advanced but would not cross the lesion. A Fielder XT density cross the lesion in the mid right coronary artery. A 2.5 x 12 balloon was used to predilate the lesion. A 2.75 x 16 promise drug-eluting stent was then deployed. The stent was post dilated with a 3.25 x 12 noncompliant balloon. There is an excellent angiographic result. There is no residual stenosis. There were several areas of vasospasm during intervention noted in the distal right and posterior lateral artery. Both resolved with intracoronary nitroglycerin.  IMPRESSIONS:  1. Absent left main coronary artery.  There are separate ostia of the LAD and circumflex 2. Moderate lesion in the mid left anterior descending artery.  If the patient fails medical therapy and this was found to be significant, this would be amenable to percutaneous revascularization. 3. Mild disease in the left circumflex artery and its branches. 4. Large, dominant right coronary artery with a 99% mid vessel stenosis which is responsible for his presentation today. This was successfully treated with a 2.75 x 16 promus drug-eluting stent, postdilated to 3.3 mm in diameter. 5. LVEDP 13 mmHg.   RECOMMENDATION:  Continue dual antiplatelet therapy for at least a year. I stressed the importance of preventive  therapy and is taking his antiplatelet therapy to prevent stent thrombosis. Continue aggressive medical therapy and antianginal therapy given the moderate disease in his LAD. If he has refractory symptoms, further ischemia evaluation is warranted.  LV gram not performed to save contrast.  Consider echocardiogram.

## 2013-06-02 NOTE — Interval H&P Note (Signed)
Cath Lab Visit (complete for each Cath Lab visit)  Clinical Evaluation Leading to the Procedure:   ACS: yes  Non-ACS:    Anginal Classification: CCS IV  Anti-ischemic medical therapy: Minimal Therapy (1 class of medications)  Non-Invasive Test Results: No non-invasive testing performed  Prior CABG: No previous CABG      History and Physical Interval Note:  06/02/2013 5:33 PM  Lonnie Skinner  has presented today for surgery, with the diagnosis of cp  The various methods of treatment have been discussed with the patient and family. After consideration of risks, benefits and other options for treatment, the patient has consented to  Procedure(s): LEFT HEART CATHETERIZATION WITH CORONARY ANGIOGRAM (N/A) as a surgical intervention .  The patient's history has been reviewed, patient examined, no change in status, stable for surgery.  I have reviewed the patient's chart and labs.  Questions were answered to the patient's satisfaction.     Tenee Wish S.

## 2013-06-02 NOTE — Progress Notes (Signed)
Subjective: No further CP.  Mother, father and brother all had CABG  Objective: Vital signs in last 24 hours: Temp:  [97.4 F (36.3 C)-98.4 F (36.9 C)] 97.4 F (36.3 C) (01/13 0502) Pulse Rate:  [59-77] 59 (01/13 0502) Resp:  [13-20] 20 (01/13 0502) BP: (111-163)/(69-99) 111/69 mmHg (01/13 0502) SpO2:  [96 %-99 %] 97 % (01/13 0502) Weight:  [213 lb 11.2 oz (96.934 kg)] 213 lb 11.2 oz (96.934 kg) (01/13 0009) Last BM Date: 06/02/13  Medications Current Facility-Administered Medications  Medication Dose Route Frequency Provider Last Rate Last Dose  . acetaminophen (TYLENOL) tablet 650 mg  650 mg Oral Q4H PRN Grafton Folk, MD      . aspirin EC tablet 81 mg  81 mg Oral Daily Grafton Folk, MD   81 mg at 06/02/13 0927  . atenolol (TENORMIN) tablet 50 mg  50 mg Oral Daily Grafton Folk, MD   50 mg at 06/02/13 0927  . heparin ADULT infusion 100 units/mL (25000 units/250 mL)  1,100 Units/hr Intravenous Continuous Rande Lawman Rumbarger, RPH 11 mL/hr at 06/01/13 2109 1,100 Units/hr at 06/01/13 2109  . hydrochlorothiazide (HYDRODIURIL) tablet 25 mg  25 mg Oral Daily Grafton Folk, MD   25 mg at 06/02/13 0927  . nitroGLYCERIN (NITROSTAT) SL tablet 0.4 mg  0.4 mg Sublingual Q5 Min x 3 PRN Grafton Folk, MD      . ondansetron (ZOFRAN) injection 4 mg  4 mg Intravenous Q6H PRN Grafton Folk, MD      . simvastatin (ZOCOR) tablet 40 mg  40 mg Oral q1800 Grafton Folk, MD        PE: General appearance: alert, cooperative and no distress Lungs: clear to auscultation bilaterally Heart: regular rate and rhythm, S1, S2 normal, no murmur, click, rub or gallop Extremities: No LEE Pulses: 2+ and symmetric 2+ right DP 1+ left  Skin: Warm and dry Neurologic: Grossly normal  Lab Results:   Recent Labs  06/01/13 1840 06/02/13 0551  WBC 9.7 8.4  HGB 16.5 15.6  HCT 45.6 43.0  PLT 256 219   BMET  Recent Labs  06/01/13 1718 06/02/13 0336  NA 140 140  K 3.5* 4.0  CL 100 100  CO2 26 29   GLUCOSE 120* 110*  BUN 15 14  CREATININE 1.33 1.38*  CALCIUM 9.3 9.1   PT/INR  Recent Labs  06/01/13 2052  LABPROT 13.5  INR 1.05   Cardiac Panel (last 3 results)  Recent Labs  06/01/13 2052 06/02/13 0050 06/02/13 0551  TROPONINI 0.83* 1.11* 0.80*      Assessment/Plan   Active Problems:   HYPERLIPIDEMIA   Essential hypertension, benign   MURMUR   NSTEMI (non-ST elevated myocardial infarction)   History of tobacco abuse  Plan:  Troponin peaked at 1.11.  Inferior and lateral TWI.   BP and HR stable.  No further CP .  Needs left heart cath today.  Significant family history.  Quit smoking ~8 years ago. He is NPO.  LIpid panel in AM.  IV heparin, ASA, atenolol, zocor, HCTZ.   LOS: 1 day    HAGER, BRYAN 06/02/2013 11:00 AM  The patient was seen, examined and discussed with Tarri Fuller, PA-C and I agree with the above.   53 year old male with very significant FH of CAD, ongoing smoker who presented with typical angina, ECG shows only non-specific changes. Enzymes positive (NSTEMI) max 1.1, No prior cath. We will schedule for a  left cath today, he is NPO and on ASA 81 mg, iv heparin. We will change atenolol to carvedilol 6.25 mg po BID and simvastatin 40 to atorvastatin 40 mg po QHS. LVEF is unknown. If ventriculogram not performed (elevated Crea), we will order an echocardiogram.   Ena Dawley, H 06/02/2013

## 2013-06-02 NOTE — H&P (View-Only) (Signed)
Subjective: No further CP.  Mother, father and brother all had CABG  Objective: Vital signs in last 24 hours: Temp:  [97.4 F (36.3 C)-98.4 F (36.9 C)] 97.4 F (36.3 C) (01/13 0502) Pulse Rate:  [59-77] 59 (01/13 0502) Resp:  [13-20] 20 (01/13 0502) BP: (111-163)/(69-99) 111/69 mmHg (01/13 0502) SpO2:  [96 %-99 %] 97 % (01/13 0502) Weight:  [213 lb 11.2 oz (96.934 kg)] 213 lb 11.2 oz (96.934 kg) (01/13 0009) Last BM Date: 06/02/13  Medications Current Facility-Administered Medications  Medication Dose Route Frequency Provider Last Rate Last Dose  . acetaminophen (TYLENOL) tablet 650 mg  650 mg Oral Q4H PRN Grafton Folk, MD      . aspirin EC tablet 81 mg  81 mg Oral Daily Grafton Folk, MD   81 mg at 06/02/13 0927  . atenolol (TENORMIN) tablet 50 mg  50 mg Oral Daily Grafton Folk, MD   50 mg at 06/02/13 0927  . heparin ADULT infusion 100 units/mL (25000 units/250 mL)  1,100 Units/hr Intravenous Continuous Rande Lawman Rumbarger, RPH 11 mL/hr at 06/01/13 2109 1,100 Units/hr at 06/01/13 2109  . hydrochlorothiazide (HYDRODIURIL) tablet 25 mg  25 mg Oral Daily Grafton Folk, MD   25 mg at 06/02/13 0927  . nitroGLYCERIN (NITROSTAT) SL tablet 0.4 mg  0.4 mg Sublingual Q5 Min x 3 PRN Grafton Folk, MD      . ondansetron (ZOFRAN) injection 4 mg  4 mg Intravenous Q6H PRN Grafton Folk, MD      . simvastatin (ZOCOR) tablet 40 mg  40 mg Oral q1800 Grafton Folk, MD        PE: General appearance: alert, cooperative and no distress Lungs: clear to auscultation bilaterally Heart: regular rate and rhythm, S1, S2 normal, no murmur, click, rub or gallop Extremities: No LEE Pulses: 2+ and symmetric 2+ right DP 1+ left  Skin: Warm and dry Neurologic: Grossly normal  Lab Results:   Recent Labs  06/01/13 1840 06/02/13 0551  WBC 9.7 8.4  HGB 16.5 15.6  HCT 45.6 43.0  PLT 256 219   BMET  Recent Labs  06/01/13 1718 06/02/13 0336  NA 140 140  K 3.5* 4.0  CL 100 100  CO2 26 29   GLUCOSE 120* 110*  BUN 15 14  CREATININE 1.33 1.38*  CALCIUM 9.3 9.1   PT/INR  Recent Labs  06/01/13 2052  LABPROT 13.5  INR 1.05   Cardiac Panel (last 3 results)  Recent Labs  06/01/13 2052 06/02/13 0050 06/02/13 0551  TROPONINI 0.83* 1.11* 0.80*      Assessment/Plan   Active Problems:   HYPERLIPIDEMIA   Essential hypertension, benign   MURMUR   NSTEMI (non-ST elevated myocardial infarction)   History of tobacco abuse  Plan:  Troponin peaked at 1.11.  Inferior and lateral TWI.   BP and HR stable.  No further CP .  Needs left heart cath today.  Significant family history.  Quit smoking ~8 years ago. He is NPO.  LIpid panel in AM.  IV heparin, ASA, atenolol, zocor, HCTZ.   LOS: 1 day    HAGER, BRYAN 06/02/2013 11:00 AM  The patient was seen, examined and discussed with Tarri Fuller, PA-C and I agree with the above.   53 year old male with very significant FH of CAD, ongoing smoker who presented with typical angina, ECG shows only non-specific changes. Enzymes positive (NSTEMI) max 1.1, No prior cath. We will schedule for a  left cath today, he is NPO and on ASA 81 mg, iv heparin. We will change atenolol to carvedilol 6.25 mg po BID and simvastatin 40 to atorvastatin 40 mg po QHS. LVEF is unknown. If ventriculogram not performed (elevated Crea), we will order an echocardiogram.   Chaka Boyson, H 06/02/2013    

## 2013-06-03 ENCOUNTER — Other Ambulatory Visit: Payer: Self-pay | Admitting: Physician Assistant

## 2013-06-03 ENCOUNTER — Encounter (HOSPITAL_COMMUNITY): Payer: Self-pay | Admitting: Physician Assistant

## 2013-06-03 DIAGNOSIS — I2510 Atherosclerotic heart disease of native coronary artery without angina pectoris: Secondary | ICD-10-CM | POA: Insufficient documentation

## 2013-06-03 DIAGNOSIS — N189 Chronic kidney disease, unspecified: Secondary | ICD-10-CM | POA: Insufficient documentation

## 2013-06-03 DIAGNOSIS — I214 Non-ST elevation (NSTEMI) myocardial infarction: Secondary | ICD-10-CM

## 2013-06-03 DIAGNOSIS — R739 Hyperglycemia, unspecified: Secondary | ICD-10-CM | POA: Insufficient documentation

## 2013-06-03 DIAGNOSIS — N182 Chronic kidney disease, stage 2 (mild): Secondary | ICD-10-CM | POA: Insufficient documentation

## 2013-06-03 LAB — BASIC METABOLIC PANEL
BUN: 18 mg/dL (ref 6–23)
CHLORIDE: 96 meq/L (ref 96–112)
CO2: 28 meq/L (ref 19–32)
Calcium: 9.2 mg/dL (ref 8.4–10.5)
Creatinine, Ser: 1.27 mg/dL (ref 0.50–1.35)
GFR calc Af Amer: 73 mL/min — ABNORMAL LOW (ref >90)
GFR calc non Af Amer: 63 mL/min — ABNORMAL LOW (ref >90)
Glucose, Bld: 156 mg/dL — ABNORMAL HIGH (ref 70–99)
Potassium: 3.6 mEq/L — ABNORMAL LOW (ref 3.7–5.3)
Sodium: 134 mEq/L — ABNORMAL LOW (ref 137–147)

## 2013-06-03 LAB — CBC
HCT: 41.6 % (ref 39.0–52.0)
HEMOGLOBIN: 14.9 g/dL (ref 13.0–17.0)
MCH: 31 pg (ref 26.0–34.0)
MCHC: 35.8 g/dL (ref 30.0–36.0)
MCV: 86.7 fL (ref 78.0–100.0)
Platelets: 221 10*3/uL (ref 150–400)
RBC: 4.8 MIL/uL (ref 4.22–5.81)
RDW: 13.1 % (ref 11.5–15.5)
WBC: 8.7 10*3/uL (ref 4.0–10.5)

## 2013-06-03 LAB — POCT ACTIVATED CLOTTING TIME: ACTIVATED CLOTTING TIME: 537 s

## 2013-06-03 LAB — LIPID PANEL
CHOL/HDL RATIO: 5 ratio
Cholesterol: 140 mg/dL (ref 0–200)
HDL: 28 mg/dL — ABNORMAL LOW (ref >39)
LDL Cholesterol: 71 mg/dL (ref 0–99)
Triglycerides: 204 mg/dL — ABNORMAL HIGH (ref <150)
VLDL: 41 mg/dL — ABNORMAL HIGH (ref 0–40)

## 2013-06-03 MED ORDER — ATORVASTATIN CALCIUM 40 MG PO TABS
40.0000 mg | ORAL_TABLET | Freq: Every day | ORAL | Status: DC
  Filled 2013-06-03: qty 1

## 2013-06-03 MED ORDER — ASPIRIN 81 MG PO TBEC: 81.0000 mg | DELAYED_RELEASE_TABLET | Freq: Every day | ORAL | Status: DC

## 2013-06-03 MED ORDER — CARVEDILOL 6.25 MG PO TABS: 6.2500 mg | ORAL_TABLET | Freq: Two times a day (BID) | ORAL | Status: DC

## 2013-06-03 MED ORDER — ATORVASTATIN CALCIUM 40 MG PO TABS: 40.0000 mg | ORAL_TABLET | Freq: Every evening | ORAL | Status: DC

## 2013-06-03 MED ORDER — POTASSIUM CHLORIDE CRYS ER 20 MEQ PO TBCR
20.0000 meq | EXTENDED_RELEASE_TABLET | Freq: Once | ORAL | Status: AC
  Administered 2013-06-03: 10:00:00 20 meq via ORAL
  Filled 2013-06-03: qty 1

## 2013-06-03 MED ORDER — PRASUGREL HCL 10 MG PO TABS: 10.0000 mg | ORAL_TABLET | Freq: Every day | ORAL | Status: DC

## 2013-06-03 MED ORDER — CARVEDILOL 6.25 MG PO TABS
6.2500 mg | ORAL_TABLET | Freq: Two times a day (BID) | ORAL | Status: DC
  Administered 2013-06-03: 10:00:00 6.25 mg via ORAL
  Filled 2013-06-03 (×3): qty 1

## 2013-06-03 MED ORDER — HYDROCHLOROTHIAZIDE 25 MG PO TABS
25.0000 mg | ORAL_TABLET | Freq: Every day | ORAL | Status: DC
  Administered 2013-06-03: 25 mg via ORAL
  Filled 2013-06-03: qty 1

## 2013-06-03 MED ORDER — NITROGLYCERIN 0.4 MG SL SUBL: 0.4000 mg | SUBLINGUAL_TABLET | SUBLINGUAL | Status: DC | PRN

## 2013-06-03 MED FILL — Sodium Chloride IV Soln 0.9%: INTRAVENOUS | Qty: 50 | Status: AC

## 2013-06-03 NOTE — Progress Notes (Signed)
TELEMETRY: Reviewed telemetry pt in NSR: Filed Vitals:   06/02/13 2308 06/03/13 0029 06/03/13 0452 06/03/13 0700  BP: 96/74  145/91 121/77  Pulse: 66  68 63  Temp: 98.2 F (36.8 C)  97.6 F (36.4 C) 97.5 F (36.4 C)  TempSrc: Oral  Oral Oral  Resp: 18  20 20   Height:      Weight:  215 lb 2.7 oz (97.6 kg)    SpO2: 92%  98% 98%    Intake/Output Summary (Last 24 hours) at 06/03/13 0946 Last data filed at 06/03/13 0200  Gross per 24 hour  Intake   1882 ml  Output      0 ml  Net   1882 ml    SUBJECTIVE Feels well this morning. Yesterday developed nausea and diarrhea. Thinks this is because he didn't eat all day then ate a large Sub sandwich. No abdominal pain. Symptoms resolved.  LABS: Basic Metabolic Panel:  Recent Labs  06/02/13 0336 06/03/13 0421  NA 140 134*  K 4.0 3.6*  CL 100 96  CO2 29 28  GLUCOSE 110* 156*  BUN 14 18  CREATININE 1.38* 1.27  CALCIUM 9.1 9.2   Liver Function Tests:  Recent Labs  06/02/13 0336  AST 24  ALT 27  ALKPHOS 93  BILITOT 0.6  PROT 6.9  ALBUMIN 3.7   No results found for this basename: LIPASE, AMYLASE,  in the last 72 hours CBC:  Recent Labs  06/02/13 0551 06/03/13 0421  WBC 8.4 8.7  HGB 15.6 14.9  HCT 43.0 41.6  MCV 84.8 86.7  PLT 219 221   Cardiac Enzymes:  Recent Labs  06/02/13 0050 06/02/13 0551 06/02/13 1151  TROPONINI 1.11* 0.80* 0.54*   Fasting Lipid Panel:  Recent Labs  06/03/13 0441  CHOL 140  HDL 28*  LDLCALC 71  TRIG 204*  CHOLHDL 5.0   Radiology/Studies:  Dg Chest 2 View  06/01/2013   CLINICAL DATA:  Chest pain  EXAM: CHEST  2 VIEW  COMPARISON:  None available  FINDINGS: The cardiac and mediastinal silhouettes are within normal limits.  The lungs are normally inflated. A few scattered linear opacity seen within the lingula are most consistent with atelectasis. No airspace consolidation, pleural effusion, or pulmonary edema is identified. There is no pneumothorax.  No acute osseous  abnormality identified.  IMPRESSION: Mild lingular atelectasis.  No acute cardiopulmonary process.   Electronically Signed   By: Jeannine Boga M.D.   On: 06/01/2013 18:57   Ecg: NSR, ? Septal infarct old.  PHYSICAL EXAM General: Well developed, well nourished, in no acute distress. Head: Normocephalic, atraumatic, sclera non-icteric, no xanthomas, nares are without discharge. Neck: Negative for carotid bruits. JVD not elevated. Lungs: Clear bilaterally to auscultation without wheezes, rales, or rhonchi. Breathing is unlabored. Heart: RRR S1 S2 without murmurs, rubs, or gallops.  Abdomen: Soft, non-tender, non-distended with normoactive bowel sounds. No hepatomegaly. No rebound/guarding. No obvious abdominal masses. Msk:  Strength and tone appears normal for age. Extremities: No clubbing, cyanosis or edema.  Distal pedal pulses are 2+ and equal bilaterally. No radial site hematoma. Neuro: Alert and oriented X 3. Moves all extremities spontaneously. Psych:  Responds to questions appropriately with a normal affect.  ASSESSMENT AND PLAN: 1. NSTEMI. S/p stenting of high grade RCA stenosis. Moderate 50-70% mid LAD. Continue ASA and Effient. If cost is an issue could consider switching to Plavix after one month. Continue dual antiplatelet therapy for at least one year. Stressed importance of compliance.  Will treat LAD medically for now. If he has recurrent chest pain would need ischemic evaluation. Since LV not assessed at Cath will arrange outpatient Echo. Reviewed importance of heart healthy, low sodium diet. Recommend phase 2 cardiac Rehab. Follow up  in 2 weeks.  2. HTN. Will switch atenolol to Coreg 6.25 mg bid. Continue HCTZ.  3. Hyperlipidemia. On gemfibrozil previously. Will DC and start on atorvastatin.   4. Tobacco abuse. Recommend smoking cessation. Patient states he is going to quit.   5. Family history of premature CAD.  6. CKD stage 2-3. Stable post cath.  Active  Problems:   HYPERLIPIDEMIA   Essential hypertension, benign   NSTEMI (non-ST elevated myocardial infarction)   History of tobacco abuse    Signed, Dwain Huhn [name] MD,FACC 06/03/2013 9:55 AM

## 2013-06-03 NOTE — Discharge Summary (Signed)
Discharge Summary   Patient ID: Lonnie Skinner MRN: [id number], DOB/AGE: 11-03-1960 53 y.o. Admit date: 06/01/2013 D/C date:     06/03/2013  Primary Care Provider: No primary provider on file. Primary Cardiologist: [name]  Primary Discharge Diagnoses:  1. CAD/NSTEMI - s/p DES to mRCA (culprit vessel), residual moderate LAD disease (the patient fails medical therapy and this was found to be significant, this would be amenable to PCI) - for outpatient echocardiogram 2. HTN 3. Hyperlipidemia 4. Probable CKD stage II-III 5. Hyperglycemia  Secondary Discharge Diagnoses:  1. Former tobacco abuse  Hospital Course: Mr. Swamy is a 53 y/o M patient followed at the [place] with history of HTN, HL, former tobacco abuse, family history of CAD who presented to [facility] 06/01/2013 with CP and NSTEMI. He has no prior cardiac history. 2 days prior to admission while walking in the yard in the cold, he noticed chest pain described as substernal chest pressure resolving with rest. The pain recurred on day of admission while carrying trash outside thus he presented to the ER. He denied SOB, orthopnea, PND, LE edema, syncope, claudcation, focal weakness, or bleeding diathesis. He had not been fully compliant with BP and cholesterol medications. EKG showed NSR with mild ST depression V4-V6. CXR was nonacute. Initial troponin was elevated at 0.83 and he was admitted for management of NSTEMI - this later peaked at 1.11. He was placed on IV heparin and aspirin. GFR was mildly decreased right around 60, indicating likely diagnosis of CKD stage II-III. He underwent cardiac cath 06/02/13 showing: 1. Absent left main coronary artery. There are separate ostia of the LAD and circumflex 2. Moderate lesion in the mid left anterior descending artery. If the patient fails medical therapy and this was found to be significant, this would be amenable to percutaneous revascularization. 3. Mild disease in the left circumflex  artery and its branches. 4. Large, dominant right coronary artery with a 99% mid vessel stenosis which is responsible for his presentation today. This was successfully treated with a 2.75 x 16 promus drug-eluting stent, postdilated to 3.3 mm in diameter. 5. LVEDP 13 mmHg. LV gram not performed to save contrast. DAPT for at least 1 yr was recommended. He was given 30-day free Rx and regular prescription for Effient. Dr. Irish Lack stressed the importance of preventive therapy and is taking his antiplatelet therapy to prevent stent thrombosis. This was also reinforced by cardiac rehab. The patient tolerated the procedure well. Dr. [name] has seen and examined the patient today and feels he is stable for discharge. Cr remained stable post catheterization. Will plan for outpatient echo to evaluate LV function.  Regarding med changes, gemfibrozil was discontinued in lieu of statin initation - consider outpt f/u LFTs/lipids in 6-8 weeks. We also changed atenolol to Coreg given his kidney disease. The patient was also instructed to f/u PCP for eval of hyperglycemia (a1c not performed this admission, CBG range 110-160).  Discharge Vitals: Blood pressure 121/77, pulse 63, temperature 97.5 F (36.4 C), temperature source Oral, resp. rate 20, height 5\' 7"  (1.702 m), weight 215 lb 2.7 oz (97.6 kg), SpO2 98.00%.  Labs: Lab Results  Component Value Date   WBC 8.7 06/03/2013   HGB 14.9 06/03/2013   HCT 41.6 06/03/2013   MCV 86.7 06/03/2013   PLT 221 06/03/2013     Recent Labs Lab 06/02/13 0336 06/03/13 0421  NA 140 134*  K 4.0 3.6*  CL 100 96  CO2 29 28  BUN 14 18  CREATININE 1.38* 1.27  CALCIUM 9.1 9.2  PROT 6.9  --   BILITOT 0.6  --   ALKPHOS 93  --   ALT 27  --   AST 24  --   GLUCOSE 110* 156*    Recent Labs  06/01/13 2052 06/02/13 0050 06/02/13 0551 06/02/13 1151  TROPONINI 0.83* 1.11* 0.80* 0.54*   Lab Results  Component Value Date   CHOL 140 06/03/2013   HDL 28* 06/03/2013   LDLCALC  71 06/03/2013   TRIG 204* 06/03/2013   No results found for this basename: DDIMER    Diagnostic Studies/Procedures   Dg Chest 2 View  06/01/2013   CLINICAL DATA:  Chest pain  EXAM: CHEST  2 VIEW  COMPARISON:  None available  FINDINGS: The cardiac and mediastinal silhouettes are within normal limits.  The lungs are normally inflated. A few scattered linear opacity seen within the lingula are most consistent with atelectasis. No airspace consolidation, pleural effusion, or pulmonary edema is identified. There is no pneumothorax.  No acute osseous abnormality identified.  IMPRESSION: Mild lingular atelectasis.  No acute cardiopulmonary process.   Electronically Signed   By: Jeannine Boga M.D.   On: 06/01/2013 18:57    Discharge Medications     Medication List    STOP taking these medications       atenolol 50 MG tablet  Commonly known as:  TENORMIN     gemfibrozil 600 MG tablet  Commonly known as:  LOPID      TAKE these medications       aspirin 81 MG EC tablet  Take 1 tablet (81 mg total) by mouth daily.     atorvastatin 40 MG tablet  Commonly known as:  LIPITOR  Take 1 tablet (40 mg total) by mouth every evening.     carvedilol 6.25 MG tablet  Commonly known as:  COREG  Take 1 tablet (6.25 mg total) by mouth 2 (two) times daily with a meal.     hydrochlorothiazide 25 MG tablet  Commonly known as:  HYDRODIURIL  Take 25 mg by mouth daily.     nitroGLYCERIN 0.4 MG SL tablet  Commonly known as:  NITROSTAT  Place 1 tablet (0.4 mg total) under the tongue every 5 (five) minutes as needed for chest pain (up to 3 doses).     prasugrel 10 MG Tabs tablet  Commonly known as:  EFFIENT  Take 1 tablet (10 mg total) by mouth daily.        Disposition   The patient will be discharged in stable condition to home. Discharge Orders   Future Appointments Provider Department Dept Phone   06/10/2013 11:30 AM Burtis Junes, NP [place] Office [phone]    06/18/2013 8:30 AM [facility] Echo Echo [place] [facility] E[facility] [phone]   Future Orders Complete By Expires   Diet - low sodium heart healthy  As directed    Increase activity slowly  As directed    Scheduling Instructions:     No driving for 1 week. No lifting over 10 lbs for 2 weeks. No sexual activity for 2 weeks. Keep procedure site clean & dry. If you notice increased pain, swelling, bleeding or pus, call/return!  You may shower, but no soaking baths/hot tubs/pools for 1 week.     Follow-up Information   Follow up with Truitt Merle, NP. ([place] - 06/10/13 at 11:30am )    Specialty:  Nurse Practitioner  Contact information:   [address]  [place] [address] [phone]       Follow up with [place]. (Heart ultrasound on 06/18/13 at 8:30am)    Specialty:  Cardiology   Contact information:   [address] [place] [address] [phone]      Follow up with Primary Care Provider. (Your blood sugar was borderline elevated in the hospital. Please make sure to follow up with your primary doctor to have this evaluated for diabetes.)       RTW - not applicable. Pt states he is on disability.  Duration of Discharge Encounter: Greater than 30 minutes including physician and PA time.  Signed, Melina Copa PA-C 06/03/2013, 8:58 AM  Patient seen and examined and history reviewed. Agree with above findings and plan. See my rounding note today.  Luana Shu 06/03/2013 9:56 AM

## 2013-06-03 NOTE — Progress Notes (Signed)
Per rep at [facility], tier 3, $45.00, no auth required, patient can use any pharmacy.

## 2013-06-03 NOTE — Care Management Note (Signed)
    Page 1 of 1   06/03/2013     11:03:23 AM   CARE MANAGEMENT NOTE 06/03/2013  Patient:  Lonnie Skinner, Lonnie Skinner   Account Number:  [id number]  Date Initiated:  06/03/2013  Documentation initiated by:  Fuller Mandril  Subjective/Objective Assessment:   52 year old male with  FH of CAD, ongoing smoker who presented with typical angina, ECG shows only non-specific changes. Enzymes positive (NSTEMI)/Home with spouse(Bardsley,CAROLINE [phone])     Action/Plan:   LEFT HEART CATHETERIZATION WITH CORONARY ANGIOGRAM //home ewith self care; benefits check for Effient 10mg  QD.   Anticipated DC Date:  06/03/2013   Anticipated DC Plan:  [place]  CM consult      Choice offered to / List presented to:             Status of service:  Completed, signed off Medicare Important Message given?   (If response is "NO", the following Medicare IM given date fields will be blank) Date Medicare IM given:   Date Additional Medicare IM given:    Discharge Disposition:    Per UR Regulation:    If discussed at Long Length of Stay Meetings, dates discussed:    Comments:  06/03/13 [place], RN, BSN, [employer] [phone] Spoke with pt at bedside regarding benefits check for Effient 10mg  QD.  Pt has brochure with 30 day free card and refill assistance card intact.  Pt utilizes [facility] on [address] for prescription needs.  NCM called pharmacy to confirm availability of medication. Information relayed to pt.  Pt verbalizes importance of filling medication upon discharge.

## 2013-06-03 NOTE — Progress Notes (Signed)
TR BAND REMOVAL  LOCATION:    right radial  DEFLATED PER PROTOCOL:    yes  TIME BAND OFF / DRESSING APPLIED:    00:00   SITE UPON ARRIVAL:    Level 0  SITE AFTER BAND REMOVAL:    Level 0  REVERSE ALLEN'S TEST:       CIRCULATION SENSATION AND MOVEMENT:    Within Normal Limits   yes  COMMENTS:   Pt tolerated removal of TR band without complication. VSS as charted will continue to monitor patient.

## 2013-06-03 NOTE — Progress Notes (Signed)
CARDIAC REHAB PHASE I   PRE:  Rate/Rhythm: 22 SR  BP:  Supine:   Sitting: 114/71  Standing:    SaO2:   MODE:  Ambulation: 1000 ft   POST:  Rate/Rhythm: 85 SR  BP:  Supine:   Sitting: 134/87  Standing:    SaO2:  1010-1120  Pt tolerated ambulation well without c/o of cp or SOB. VS stable. Completed MI and stent education with pt and wife. Pt has a difficult time trying to grasp all of the information and looks to his wife and ask her if she is listening. He seems overwhelmed easily with very much information. Pt was able to recall some of the information with teach back, but his wife was able to recall all of information. Pt agrees to [facility] [place]. CRP in [place], will send referral.  Rodney Langton RN 06/03/2013 11:23 AM

## 2013-06-04 ENCOUNTER — Telehealth: Payer: Self-pay | Admitting: Interventional Cardiology

## 2013-06-04 NOTE — Telephone Encounter (Signed)
Pt's wife notified.

## 2013-06-04 NOTE — Telephone Encounter (Signed)
Yes that is okay.  I would recommend starting with colace 100 mg once to twice daily.  If this didn't help, he could try Senokot-S 1-2 per day.

## 2013-06-04 NOTE — Telephone Encounter (Signed)
Lonnie Skinner, is it okay to tell pt to use OTC constipation medications?

## 2013-06-04 NOTE — Telephone Encounter (Signed)
New message  Patient is constipated and wife needs advise as to what he can take? Please call and advise.

## 2013-06-06 NOTE — ED Provider Notes (Signed)
Medical screening examination/treatment/procedure(s) were performed by non-physician practitioner and as supervising physician I was immediately available for consultation/collaboration.  EKG Interpretation    Date/Time:  Monday June 01 2013 17:10:07 EST Ventricular Rate:  75 PR Interval:  166 QRS Duration: 86 QT Interval:  368 QTC Calculation: 410 R Axis:   52 Text Interpretation:  Normal sinus rhythm Nonspecific ST abnormality Abnormal ECG ED PHYSICIAN INTERPRETATION AVAILABLE IN [facility] Confirmed by TEST, RECORD ([id number]) on 06/03/2013 8:35:00 AM             Virgel Manifold, MD 06/06/13 [phone]

## 2013-06-08 NOTE — Telephone Encounter (Signed)
Spoke w/wife.  She states Mr. Bangs was constipated and took Colace which has helped. She states he says he is straining with BM. Yesterday and today had a bloody stool and when he wipes is bloody.  Does not feel weak. Has had hemorrhoids in past.  Suggested since he has been constipated he probably has strained and caused the hemorrhoid to bleed since he is on Eliquis. Spoke w/Jeremy Warehouse manager) who suggested that he could use Hydrocortisone cream OTC and continue to use stool softeners. Tried to call wife back but had to leave a message with instructions.  Pt is scheduled to see Kathrene Alu on Wed. Lm to call back if has further concerns.

## 2013-06-08 NOTE — Telephone Encounter (Signed)
New message   C/O blood in stool today / yesterday.

## 2013-06-10 ENCOUNTER — Encounter: Payer: Self-pay | Admitting: Nurse Practitioner

## 2013-06-10 ENCOUNTER — Other Ambulatory Visit: Payer: Self-pay | Admitting: *Deleted

## 2013-06-10 ENCOUNTER — Ambulatory Visit (INDEPENDENT_AMBULATORY_CARE_PROVIDER_SITE_OTHER): Payer: Medicare Other | Admitting: Nurse Practitioner

## 2013-06-10 VITALS — BP 130/86 | HR 88 | Ht 67.0 in | Wt 212.4 lb

## 2013-06-10 DIAGNOSIS — R7309 Other abnormal glucose: Secondary | ICD-10-CM

## 2013-06-10 DIAGNOSIS — I214 Non-ST elevation (NSTEMI) myocardial infarction: Secondary | ICD-10-CM

## 2013-06-10 DIAGNOSIS — E785 Hyperlipidemia, unspecified: Secondary | ICD-10-CM

## 2013-06-10 DIAGNOSIS — F419 Anxiety disorder, unspecified: Secondary | ICD-10-CM

## 2013-06-10 DIAGNOSIS — R739 Hyperglycemia, unspecified: Secondary | ICD-10-CM

## 2013-06-10 LAB — HEMOGLOBIN A1C: Hgb A1c MFr Bld: 5.7 % (ref 4.6–6.5)

## 2013-06-10 NOTE — Patient Instructions (Addendum)
We need to arrange for an echocardiogram - come next week as planned  Stay on your current medicines  We need to check labs today  See Dr. [name] in 4 weeks with fasting labs  I will send a note to cardiac rehab - ok to attend  Call the [place] office at [phone] if you have any questions, problems or concerns.

## 2013-06-10 NOTE — Progress Notes (Signed)
Lonnie Skinner Date of Birth: Nov 13, 1960 Medical Record #[id number]  History of Present Illness: Lonnie Skinner is seen back today for a post hospital/TOC visit - seen for Lonnie Skinner. He has HTN, HLD, stage II-III CKD and hyperglycemia. No prior cardiac history. Followed at the [place].   Most recently with CAD/NSTEMI - s/p DES to the mRCA, residual moderate LAD disease (if fails on medical therapy would proceed with PCI). For outpatient echo next week.   Comes back today. Here with his wife. He is doing ok. No chest pain. Not short of breath. Taking his medicines. Wanting to start doing more. He is limited by back and knee pain - disabled from the [employer]. Did have some constipation after he got home - was straining - some blood in the toilet noted. Has stopped now. Thinks his bowels are ok now.   Current Outpatient Prescriptions  Medication Sig Dispense Refill  . aspirin EC 81 MG EC tablet Take 1 tablet (81 mg total) by mouth daily.      Marland Kitchen atorvastatin (LIPITOR) 40 MG tablet Take 1 tablet (40 mg total) by mouth every evening.  30 tablet  6  . carvedilol (COREG) 6.25 MG tablet Take 1 tablet (6.25 mg total) by mouth 2 (two) times daily with a meal.  60 tablet  6  . hydrochlorothiazide (HYDRODIURIL) 25 MG tablet Take 25 mg by mouth daily.      . nitroGLYCERIN (NITROSTAT) 0.4 MG SL tablet Place 1 tablet (0.4 mg total) under the tongue every 5 (five) minutes as needed for chest pain (up to 3 doses).  25 tablet  3  . prasugrel (EFFIENT) 10 MG TABS tablet Take 1 tablet (10 mg total) by mouth daily.  30 tablet  10   No current facility-administered medications for this visit.    No Known Allergies  Past Medical History  Diagnosis Date  . Hypertension   . Hypercholesteremia   . CAD (coronary artery disease)     a. 05/2013: NSTEMI s/p DES to mRCA (culprit vessel), residual moderate LAD disease (the patient fails medical therapy and this was found to be significant, this would be amenable to PCI).  .  Hyperglycemia   . CKD (chronic kidney disease), stage II     Past Surgical History  Procedure Laterality Date  . Coronary stent placement  05/2013    mRCA    History  Smoking status  . Former Smoker  Smokeless tobacco  . Not on file    Comment: Quit ~2007    History  Alcohol Use No    Family History  Problem Relation Age of Onset  . CAD      Multiple family members with CABG    Review of Systems: The review of systems is per the HPI.  All other systems were reviewed and are negative.  Physical Exam: BP 130/86  Pulse 88  Ht 5\' 7"  (1.702 m)  Wt 212 lb 6.4 oz (96.344 kg)  BMI 33.26 kg/m2  SpO2 95% Patient is alert and in no acute distress. Does seem just a little slow mentally. Skin is warm and dry. Color is normal.  HEENT is unremarkable but with missing teeth.  Normocephalic/atraumatic. PERRL. Sclera are nonicteric. Neck is supple. No masses. No JVD. Lungs are clear. Cardiac exam shows a regular rate and rhythm. Abdomen is soft. Extremities are without edema. Gait and ROM are intact. No gross neurologic deficits noted. Right arm looks ok.   LABORATORY DATA: PENDING  Lab Results  Component Value Date   WBC 8.7 06/03/2013   HGB 14.9 06/03/2013   HCT 41.6 06/03/2013   PLT 221 06/03/2013   GLUCOSE 156* 06/03/2013   CHOL 140 06/03/2013   TRIG 204* 06/03/2013   HDL 28* 06/03/2013   LDLCALC 71 06/03/2013   ALT 27 06/02/2013   AST 24 06/02/2013   NA 134* 06/03/2013   K 3.6* 06/03/2013   CL 96 06/03/2013   CREATININE 1.27 06/03/2013   BUN 18 06/03/2013   CO2 28 06/03/2013   TSH 3.05 05/19/2009   INR 1.05 06/01/2013    Lab Results  Component Value Date   TROPONINI 0.54* 06/02/2013     ANGIOGRAPHIC DATA: The left main coronary artery is absent. There appear to be separate ostia of the LAD and circumflex.  The left anterior descending artery is a large vessel which reaches the apex. In the mid vessel, there is a 50-70% lesion in the mid LAD. There is mild to moderate diffuse  disease. There 2 large diagonals which are widely patent. The apical LAD is small but patent.  The left circumflex artery is a medium size vessel. There appears to be some vasospasm proximally. There is a large first marginal which is patent. The second marginal is a larger vessel which branches across the lateral wall. This appears widely patent.  The right coronary artery is a large dominant vessel. In the mid vessel, there is a focal 99% stenosis with visible thrombus. The posterior lateral artery is very large and widely patent. The posterior descending artery is widely patent.  LEFT VENTRICULOGRAM: Left ventricular angiogram was not done. LVEDP was 13 mmHg.  PCI NARRATIVE: A JR 4 guiding catheters u used to engage the RCA. Angiomax was used for anticoagulation. Initially, a pro-water wire was advanced but would not cross the lesion. A Fielder XT density cross the lesion in the mid right coronary artery. A 2.5 x 12 balloon was used to predilate the lesion. A 2.75 x 16 promise drug-eluting stent was then deployed. The stent was post dilated with a 3.25 x 12 noncompliant balloon. There is an excellent angiographic result. There is no residual stenosis. There were several areas of vasospasm during intervention noted in the distal right and posterior lateral artery. Both resolved with intracoronary nitroglycerin.  IMPRESSIONS:  1. Absent left main coronary artery. There are separate ostia of the LAD and circumflex 2. Moderate lesion in the mid left anterior descending artery. If the patient fails medical therapy and this was found to be significant, this would be amenable to percutaneous revascularization. 3. Mild disease in the left circumflex artery and its branches. 4. Large, dominant right coronary artery with a 99% mid vessel stenosis which is responsible for his presentation today. This was successfully treated with a 2.75 x 16 promus drug-eluting stent, postdilated to 3.3 mm in diameter. 5. LVEDP 13  mmHg.  RECOMMENDATION: Continue dual antiplatelet therapy for at least a year. I stressed the importance of preventive therapy and is taking his antiplatelet therapy to prevent stent thrombosis. Continue aggressive medical therapy and antianginal therapy given the moderate disease in his LAD. If he has refractory symptoms, further ischemia evaluation is warranted. LV gram not performed to save contrast. Consider echocardiogram.      Assessment / Plan:  1. CAD/NSTEMI with PCI with DES to the mid RCA with residual moderate LAD disease - I think he is doing well. No symptoms reported. Will try and let him try cardiac rehab. For echo next week  to evaluate his EF.   2. HTN - BP ok with current regimen.  3. CKD - recheck labs today  4. HLD - on statin - will need follow up labs in 4 weeks.   5. ?blood in stool from constipation - recheck lab today. Further disposition to follow if needed.   Patient is agreeable to this plan and will call if any problems develop in the interim.   Burtis Junes, RN, [facility] [address] [place], [place]  [id number] [phone]

## 2013-06-12 ENCOUNTER — Other Ambulatory Visit: Payer: Self-pay

## 2013-06-12 DIAGNOSIS — I2510 Atherosclerotic heart disease of native coronary artery without angina pectoris: Secondary | ICD-10-CM

## 2013-06-12 DIAGNOSIS — I214 Non-ST elevation (NSTEMI) myocardial infarction: Secondary | ICD-10-CM

## 2013-06-12 DIAGNOSIS — I10 Essential (primary) hypertension: Secondary | ICD-10-CM

## 2013-06-18 ENCOUNTER — Other Ambulatory Visit (INDEPENDENT_AMBULATORY_CARE_PROVIDER_SITE_OTHER): Payer: Medicare Other

## 2013-06-18 ENCOUNTER — Ambulatory Visit (HOSPITAL_COMMUNITY): Payer: Medicare Other | Attending: Interventional Cardiology | Admitting: Radiology

## 2013-06-18 ENCOUNTER — Other Ambulatory Visit (HOSPITAL_COMMUNITY): Payer: Self-pay | Admitting: Radiology

## 2013-06-18 DIAGNOSIS — E669 Obesity, unspecified: Secondary | ICD-10-CM | POA: Insufficient documentation

## 2013-06-18 DIAGNOSIS — I219 Acute myocardial infarction, unspecified: Secondary | ICD-10-CM

## 2013-06-18 DIAGNOSIS — I10 Essential (primary) hypertension: Secondary | ICD-10-CM | POA: Insufficient documentation

## 2013-06-18 DIAGNOSIS — I214 Non-ST elevation (NSTEMI) myocardial infarction: Secondary | ICD-10-CM

## 2013-06-18 DIAGNOSIS — I2510 Atherosclerotic heart disease of native coronary artery without angina pectoris: Secondary | ICD-10-CM

## 2013-06-18 DIAGNOSIS — I252 Old myocardial infarction: Secondary | ICD-10-CM | POA: Insufficient documentation

## 2013-06-18 DIAGNOSIS — E785 Hyperlipidemia, unspecified: Secondary | ICD-10-CM | POA: Insufficient documentation

## 2013-06-18 LAB — CBC WITH DIFFERENTIAL/PLATELET
Basophils Absolute: 0 10*3/uL (ref 0.0–0.1)
Basophils Relative: 0.4 % (ref 0.0–3.0)
Eosinophils Absolute: 0.1 10*3/uL (ref 0.0–0.7)
Eosinophils Relative: 1.5 % (ref 0.0–5.0)
HCT: 43.6 % (ref 39.0–52.0)
Hemoglobin: 14.8 g/dL (ref 13.0–17.0)
Lymphocytes Relative: 26.4 % (ref 12.0–46.0)
Lymphs Abs: 2.1 10*3/uL (ref 0.7–4.0)
MCHC: 33.8 g/dL (ref 30.0–36.0)
MCV: 91.5 fl (ref 78.0–100.0)
Monocytes Absolute: 0.6 10*3/uL (ref 0.1–1.0)
Monocytes Relative: 7 % (ref 3.0–12.0)
Neutro Abs: 5.3 10*3/uL (ref 1.4–7.7)
Neutrophils Relative %: 64.7 % (ref 43.0–77.0)
Platelets: 238 10*3/uL (ref 150.0–400.0)
RBC: 4.77 Mil/uL (ref 4.22–5.81)
RDW: 13.2 % (ref 11.5–14.6)
WBC: 8.1 10*3/uL (ref 4.5–10.5)

## 2013-06-18 LAB — BASIC METABOLIC PANEL
BUN: 20 mg/dL (ref 6–23)
CO2: 30 mEq/L (ref 19–32)
Calcium: 9.3 mg/dL (ref 8.4–10.5)
Chloride: 100 mEq/L (ref 96–112)
Creatinine, Ser: 1.4 mg/dL (ref 0.4–1.5)
GFR: 58.71 mL/min — ABNORMAL LOW (ref >60.00)
Glucose, Bld: 94 mg/dL (ref 70–99)
Potassium: 3.6 mEq/L (ref 3.5–5.1)
Sodium: 136 mEq/L (ref 135–145)

## 2013-06-18 NOTE — Progress Notes (Signed)
Echocardiogram performed.  

## 2013-06-18 NOTE — Addendum Note (Signed)
Addended by: Ethel Rana on: 06/18/2013 11:49 AM   Modules accepted: Orders

## 2013-06-19 ENCOUNTER — Telehealth: Payer: Self-pay | Admitting: *Deleted

## 2013-06-22 ENCOUNTER — Telehealth: Payer: Self-pay | Admitting: Interventional Cardiology

## 2013-06-22 NOTE — Telephone Encounter (Signed)
Normal LV function by echo

## 2013-06-23 NOTE — Telephone Encounter (Signed)
Opened in error

## 2013-06-23 NOTE — Telephone Encounter (Signed)
lmtrc

## 2013-06-23 NOTE — Telephone Encounter (Signed)
Pt.notified

## 2013-07-09 ENCOUNTER — Other Ambulatory Visit: Payer: Medicare Other

## 2013-07-09 ENCOUNTER — Ambulatory Visit: Payer: Medicare Other | Admitting: Cardiology

## 2013-07-22 ENCOUNTER — Ambulatory Visit (INDEPENDENT_AMBULATORY_CARE_PROVIDER_SITE_OTHER): Payer: Medicare Other | Admitting: Cardiology

## 2013-07-22 ENCOUNTER — Encounter: Payer: Self-pay | Admitting: Cardiology

## 2013-07-22 ENCOUNTER — Other Ambulatory Visit: Payer: Medicare Other

## 2013-07-22 VITALS — BP 130/90 | HR 80 | Ht 67.0 in | Wt 220.0 lb

## 2013-07-22 DIAGNOSIS — E785 Hyperlipidemia, unspecified: Secondary | ICD-10-CM

## 2013-07-22 LAB — COMPREHENSIVE METABOLIC PANEL
ALT: 56 U/L — ABNORMAL HIGH (ref 0–53)
AST: 32 U/L (ref 0–37)
Albumin: 4.2 g/dL (ref 3.5–5.2)
Alkaline Phosphatase: 113 U/L (ref 39–117)
BUN: 23 mg/dL (ref 6–23)
CO2: 31 mEq/L (ref 19–32)
Calcium: 10 mg/dL (ref 8.4–10.5)
Chloride: 104 mEq/L (ref 96–112)
Creatinine, Ser: 1.4 mg/dL (ref 0.4–1.5)
GFR: 56.75 mL/min — ABNORMAL LOW (ref >60.00)
Glucose, Bld: 109 mg/dL — ABNORMAL HIGH (ref 70–99)
Potassium: 4 mEq/L (ref 3.5–5.1)
Sodium: 149 mEq/L — ABNORMAL HIGH (ref 135–145)
Total Bilirubin: 0.9 mg/dL (ref 0.3–1.2)
Total Protein: 7.6 g/dL (ref 6.0–8.3)

## 2013-07-22 LAB — LIPID PANEL
Cholesterol: 119 mg/dL (ref 0–200)
HDL: 36.4 mg/dL — ABNORMAL LOW (ref >39.00)
LDL Cholesterol: 42 mg/dL (ref 0–99)
Total CHOL/HDL Ratio: 3
Triglycerides: 203 mg/dL — ABNORMAL HIGH (ref 0.0–149.0)
VLDL: 40.6 mg/dL — ABNORMAL HIGH (ref 0.0–40.0)

## 2013-07-22 NOTE — Progress Notes (Signed)
Patient ID: Lonnie Skinner, male   DOB: 1960-12-21, 53 y.o.   MRN: [id number]    History of Present Illness: Lonnie Skinner is seen back today for a post hospital/TOC visit - seen for Dr. [name]. He has HTN, HLD, stage II-III CKD and hyperglycemia. No prior cardiac history. Followed at the [place].   Most recently with CAD/NSTEMI - s/p DES to the mRCA, residual moderate LAD disease (if fails on medical therapy would proceed with PCI). For outpatient echo next week.   Comes back today. Here with his wife. He is doing ok. No chest pain. Not short of breath. Taking his medicines. Wanting to start doing more. He is limited by back and knee pain - disabled from the [employer]. Did have some constipation after he got home - was straining - some blood in the toilet noted. Has stopped now. Thinks his bowels are ok now.   Today is 6 week follow up, he denies chest pain, SOB. He still hasn't started cardiac rehabilitation. He is complaint with his meds and healthy diet.  No Known Allergies  Past Medical History  Diagnosis Date  . Hypertension   . Hypercholesteremia   . CAD (coronary artery disease)     a. 05/2013: NSTEMI s/p DES to mRCA (culprit vessel), residual moderate LAD disease (the patient fails medical therapy and this was found to be significant, this would be amenable to PCI).  . Hyperglycemia   . CKD (chronic kidney disease), stage II     Past Surgical History  Procedure Laterality Date  . Coronary stent placement  05/2013    mRCA    History  Smoking status  . Former Smoker  Smokeless tobacco  . Not on file    Comment: Quit ~2007    History  Alcohol Use No    Family History  Problem Relation Age of Onset  . CAD      Multiple family members with CABG    Review of Systems: The review of systems is per the HPI.  All other systems were reviewed and are negative.  Physical Exam: BP 130/90  Pulse 80  Ht 5\' 7"  (1.702 m)  Wt 220 lb (99.791 kg)  BMI 34.45 kg/m2 Patient is alert and  in no acute distress. Does seem just a little slow mentally. Skin is warm and dry. Color is normal.  HEENT is unremarkable but with missing teeth.  Normocephalic/atraumatic. PERRL. Sclera are nonicteric. Neck is supple. No masses. No JVD. Lungs are clear. Cardiac exam shows a regular rate and rhythm. Abdomen is soft. Extremities are without edema. Gait and ROM are intact. No gross neurologic deficits noted. Right arm looks ok.   LABORATORY DATA: PENDING  Lab Results  Component Value Date   WBC 8.1 06/18/2013   HGB 14.8 06/18/2013   HCT 43.6 06/18/2013   PLT 238.0 06/18/2013   GLUCOSE 94 06/18/2013   CHOL 140 06/03/2013   TRIG 204* 06/03/2013   HDL 28* 06/03/2013   LDLCALC 71 06/03/2013   ALT 27 06/02/2013   AST 24 06/02/2013   NA 136 06/18/2013   K 3.6 06/18/2013   CL 100 06/18/2013   CREATININE 1.4 06/18/2013   BUN 20 06/18/2013   CO2 30 06/18/2013   TSH 3.05 05/19/2009   INR 1.05 06/01/2013   HGBA1C 5.7 06/10/2013    Lab Results  Component Value Date   TROPONINI 0.54* 06/02/2013    ANGIOGRAPHIC DATA: The left main coronary artery is absent. There appear to be separate  ostia of the LAD and circumflex.  The left anterior descending artery is a large vessel which reaches the apex. In the mid vessel, there is a 50-70% lesion in the mid LAD. There is mild to moderate diffuse disease. There 2 large diagonals which are widely patent. The apical LAD is small but patent.  The left circumflex artery is a medium size vessel. There appears to be some vasospasm proximally. There is a large first marginal which is patent. The second marginal is a larger vessel which branches across the lateral wall. This appears widely patent.  The right coronary artery is a large dominant vessel. In the mid vessel, there is a focal 99% stenosis with visible thrombus. The posterior lateral artery is very large and widely patent. The posterior descending artery is widely patent.  LEFT VENTRICULOGRAM: Left ventricular angiogram  was not done. LVEDP was 13 mmHg.  PCI NARRATIVE: A JR 4 guiding catheters u used to engage the RCA. Angiomax was used for anticoagulation. Initially, a pro-water wire was advanced but would not cross the lesion. A Fielder XT density cross the lesion in the mid right coronary artery. A 2.5 x 12 balloon was used to predilate the lesion. A 2.75 x 16 promise drug-eluting stent was then deployed. The stent was post dilated with a 3.25 x 12 noncompliant balloon. There is an excellent angiographic result. There is no residual stenosis. There were several areas of vasospasm during intervention noted in the distal right and posterior lateral artery. Both resolved with intracoronary nitroglycerin.   Echo 06/18/2013 Left ventricle: The cavity size was normal. There was mild focal basal hypertrophy of the septum. Systolic function was normal. The estimated ejection fraction was in the range of 55% to 60%. Wall motion was normal; there were no regional wall motion abnormalities. Features are consistent with a pseudonormal left ventricular filling pattern, with concomitant abnormal relaxation and increased filling pressure (grade 2 diastolic dysfunction).    IMPRESSIONS:  1. Absent left main coronary artery. There are separate ostia of the LAD and circumflex 2. Moderate lesion in the mid left anterior descending artery. If the patient fails medical therapy and this was found to be significant, this would be amenable to percutaneous revascularization. 3. Mild disease in the left circumflex artery and its branches. 4. Large, dominant right coronary artery with a 99% mid vessel stenosis which is responsible for his presentation today. This was successfully treated with a 2.75 x 16 promus drug-eluting stent, postdilated to 3.3 mm in diameter. 5. LVEDP 13 mmHg.  RECOMMENDATION: Continue dual antiplatelet therapy for at least a year. I stressed the importance of preventive therapy and is taking his antiplatelet  therapy to prevent stent thrombosis. Continue aggressive medical therapy and antianginal therapy given the moderate disease in his LAD. If he has refractory symptoms, further ischemia evaluation is warranted. LV gram not performed to save contrast. Consider echocardiogram.      Assessment / Plan:  1. CAD/NSTEMI with PCI with DES to the mid RCA with residual moderate LAD disease - I think he is doing well. No symptoms reported. Will check on status with cardiac rehab so he can start ASAP. Echo showed preserved LVEF, but stage 2 diastolic dysfunction and elevated filling pressures.   2. HTN - BP ok with current regimen.  3. CKD - recheck labs today  4. HLD - on statin - recheck labs today   5. Clearance letter for dental work printed  Follow up in 3 months.    Cooper Stamp,  Jamse Belfast 07/22/2013

## 2013-07-22 NOTE — Patient Instructions (Signed)
**Note De-Identified Carlon Davidson Obfuscation** Your physician recommends that you continue on your current medications as directed. Please refer to the Current Medication list given to you today.  Your physician recommends that you return for lab work in: today  Your physician recommends that you schedule a follow-up appointment in: 3 months

## 2013-07-22 NOTE — Addendum Note (Signed)
Addended by: Dennie Fetters on: 07/22/2013 09:13 AM   Modules accepted: Orders

## 2013-07-28 ENCOUNTER — Ambulatory Visit: Payer: Medicare Other | Admitting: Cardiology

## 2013-07-28 ENCOUNTER — Other Ambulatory Visit: Payer: Medicare Other

## 2013-08-03 ENCOUNTER — Encounter (HOSPITAL_COMMUNITY): Admission: RE | Admit: 2013-08-03 | Payer: Self-pay | Source: Ambulatory Visit

## 2013-08-05 ENCOUNTER — Encounter (HOSPITAL_COMMUNITY): Payer: Self-pay

## 2013-08-07 ENCOUNTER — Encounter (HOSPITAL_COMMUNITY): Payer: Self-pay

## 2013-08-10 ENCOUNTER — Encounter (HOSPITAL_COMMUNITY): Payer: Self-pay

## 2013-08-12 ENCOUNTER — Encounter (HOSPITAL_COMMUNITY): Payer: Self-pay

## 2013-08-13 ENCOUNTER — Telehealth: Payer: Self-pay | Admitting: General Practice

## 2013-08-13 NOTE — Telephone Encounter (Signed)
Pt called to schedule a New patient appt with a PCP.  Pt emphatically stated he only wanted to see a male Dr and needs first available since he has been on the  waitlist. Pt was told there was a slot on the second week of April but he proceeded to say that he did not want that appt since it would be with a male Dr and he only wants a male.  Pt was told that the next day there was appt with a male Dr in the morning, which was the pt's preference, and he confirmed that that worked for him.  Subsequently spent next 10 min explaining how to get to clinic, pt repeatedly asked if we were located inside the [facility] building.  As the call was about to end pt cuts me off and says that he prefers the first appt that was offered, with the male Dr. Unfortunately by this time the slot was already taken and pt became upset saying how could that happen if we had not been on the phone that long, even though it had been 20 min.  Pt kept saying he wanted to see a male Dr and did not understand why the slot was taken.  Pt was informed that the next appt with a male Dr will be the last week in April.  After reiterating date/time of appt and directions three times pt asks why I am being hasty and I try to explain to the pt that I have explained all I can regarding the appt and how to get to clinic and that I have answer the calls that are waiting.  Pt seemed confused and upset with this reasoning.

## 2013-08-14 ENCOUNTER — Encounter (HOSPITAL_COMMUNITY): Payer: Self-pay

## 2013-08-17 ENCOUNTER — Encounter (HOSPITAL_COMMUNITY): Payer: Self-pay

## 2013-08-19 ENCOUNTER — Encounter (HOSPITAL_COMMUNITY): Payer: Self-pay

## 2013-08-21 ENCOUNTER — Encounter (HOSPITAL_COMMUNITY): Payer: Self-pay

## 2013-08-24 ENCOUNTER — Encounter (HOSPITAL_COMMUNITY): Payer: Self-pay

## 2013-08-26 ENCOUNTER — Encounter (HOSPITAL_COMMUNITY): Payer: Self-pay

## 2013-08-28 ENCOUNTER — Encounter (HOSPITAL_COMMUNITY): Payer: Self-pay

## 2013-08-31 ENCOUNTER — Telehealth: Payer: Self-pay | Admitting: General Practice

## 2013-08-31 ENCOUNTER — Encounter (HOSPITAL_COMMUNITY): Payer: Self-pay

## 2013-08-31 NOTE — Telephone Encounter (Signed)
Called to confirm appt pt's new patient appt.  Pt says that he would like to reschedule pt was informed that the next available would be in a couple of months and that we could add him to waitlist in case someone cancels.  Pt was upset and seemed confused.  Pt was advised to call in next week to see if there are nurse practitioner appts available.

## 2013-09-01 ENCOUNTER — Ambulatory Visit: Payer: Medicare Other | Admitting: Internal Medicine

## 2013-09-02 ENCOUNTER — Encounter (HOSPITAL_COMMUNITY): Payer: Self-pay

## 2013-09-04 ENCOUNTER — Encounter (HOSPITAL_COMMUNITY): Payer: Self-pay

## 2013-09-07 ENCOUNTER — Encounter (HOSPITAL_COMMUNITY): Payer: Self-pay

## 2013-09-09 ENCOUNTER — Encounter (HOSPITAL_COMMUNITY): Payer: Self-pay

## 2013-09-11 ENCOUNTER — Encounter (HOSPITAL_COMMUNITY): Payer: Self-pay

## 2013-09-14 ENCOUNTER — Encounter (HOSPITAL_COMMUNITY): Payer: Self-pay

## 2013-09-16 ENCOUNTER — Encounter (HOSPITAL_COMMUNITY): Payer: Self-pay

## 2013-09-18 ENCOUNTER — Encounter (HOSPITAL_COMMUNITY): Payer: Self-pay

## 2013-09-21 ENCOUNTER — Encounter (HOSPITAL_COMMUNITY): Payer: Self-pay

## 2013-09-23 ENCOUNTER — Encounter (HOSPITAL_COMMUNITY): Payer: Self-pay

## 2013-09-25 ENCOUNTER — Encounter (HOSPITAL_COMMUNITY): Payer: Self-pay

## 2013-09-28 ENCOUNTER — Encounter (HOSPITAL_COMMUNITY): Payer: Self-pay

## 2013-09-30 ENCOUNTER — Encounter (HOSPITAL_COMMUNITY): Payer: Self-pay

## 2013-10-02 ENCOUNTER — Encounter (HOSPITAL_COMMUNITY): Payer: Self-pay

## 2013-10-02 ENCOUNTER — Emergency Department (HOSPITAL_COMMUNITY)
Admission: EM | Admit: 2013-10-02 | Discharge: 2013-10-02 | Disposition: A | Discharge status: Home / Self Care | Payer: Medicare Other | Attending: Emergency Medicine | Admitting: Emergency Medicine

## 2013-10-02 ENCOUNTER — Encounter (HOSPITAL_COMMUNITY): Payer: Self-pay | Admitting: Emergency Medicine

## 2013-10-02 ENCOUNTER — Other Ambulatory Visit: Payer: Self-pay

## 2013-10-02 DIAGNOSIS — Z79899 Other long term (current) drug therapy: Secondary | ICD-10-CM | POA: Insufficient documentation

## 2013-10-02 DIAGNOSIS — I252 Old myocardial infarction: Secondary | ICD-10-CM | POA: Insufficient documentation

## 2013-10-02 DIAGNOSIS — Z87891 Personal history of nicotine dependence: Secondary | ICD-10-CM | POA: Insufficient documentation

## 2013-10-02 DIAGNOSIS — E7800 Pure hypercholesterolemia, unspecified: Secondary | ICD-10-CM | POA: Insufficient documentation

## 2013-10-02 DIAGNOSIS — I129 Hypertensive chronic kidney disease with stage 1 through stage 4 chronic kidney disease, or unspecified chronic kidney disease: Secondary | ICD-10-CM | POA: Insufficient documentation

## 2013-10-02 DIAGNOSIS — I2510 Atherosclerotic heart disease of native coronary artery without angina pectoris: Secondary | ICD-10-CM | POA: Insufficient documentation

## 2013-10-02 DIAGNOSIS — I5030 Unspecified diastolic (congestive) heart failure: Secondary | ICD-10-CM | POA: Insufficient documentation

## 2013-10-02 DIAGNOSIS — Z7982 Long term (current) use of aspirin: Secondary | ICD-10-CM | POA: Insufficient documentation

## 2013-10-02 DIAGNOSIS — Y929 Unspecified place or not applicable: Secondary | ICD-10-CM | POA: Insufficient documentation

## 2013-10-02 DIAGNOSIS — W57XXXA Bitten or stung by nonvenomous insect and other nonvenomous arthropods, initial encounter: Secondary | ICD-10-CM | POA: Insufficient documentation

## 2013-10-02 DIAGNOSIS — N182 Chronic kidney disease, stage 2 (mild): Secondary | ICD-10-CM | POA: Insufficient documentation

## 2013-10-02 DIAGNOSIS — S30860A Insect bite (nonvenomous) of lower back and pelvis, initial encounter: Secondary | ICD-10-CM | POA: Insufficient documentation

## 2013-10-02 DIAGNOSIS — Y939 Activity, unspecified: Secondary | ICD-10-CM | POA: Insufficient documentation

## 2013-10-02 DIAGNOSIS — A09 Infectious gastroenteritis and colitis, unspecified: Secondary | ICD-10-CM | POA: Insufficient documentation

## 2013-10-02 DIAGNOSIS — F411 Generalized anxiety disorder: Secondary | ICD-10-CM | POA: Insufficient documentation

## 2013-10-02 DIAGNOSIS — Z7901 Long term (current) use of anticoagulants: Secondary | ICD-10-CM | POA: Insufficient documentation

## 2013-10-02 DIAGNOSIS — Z9861 Coronary angioplasty status: Secondary | ICD-10-CM | POA: Insufficient documentation

## 2013-10-02 LAB — CBC WITH DIFFERENTIAL/PLATELET
BASOS ABS: 0 10*3/uL (ref 0.0–0.1)
BASOS PCT: 0 % (ref 0–1)
EOS PCT: 0 % (ref 0–5)
Eosinophils Absolute: 0 10*3/uL (ref 0.0–0.7)
HCT: 43.7 % (ref 39.0–52.0)
Hemoglobin: 16 g/dL (ref 13.0–17.0)
Lymphocytes Relative: 6 % — ABNORMAL LOW (ref 12–46)
Lymphs Abs: 0.6 10*3/uL — ABNORMAL LOW (ref 0.7–4.0)
MCH: 30.9 pg (ref 26.0–34.0)
MCHC: 36.6 g/dL — AB (ref 30.0–36.0)
MCV: 84.5 fL (ref 78.0–100.0)
MONO ABS: 0.5 10*3/uL (ref 0.1–1.0)
Monocytes Relative: 4 % (ref 3–12)
Neutro Abs: 9.5 10*3/uL — ABNORMAL HIGH (ref 1.7–7.7)
Neutrophils Relative %: 90 % — ABNORMAL HIGH (ref 43–77)
Platelets: 202 10*3/uL (ref 150–400)
RBC: 5.17 MIL/uL (ref 4.22–5.81)
RDW: 13.1 % (ref 11.5–15.5)
WBC: 10.6 10*3/uL — ABNORMAL HIGH (ref 4.0–10.5)

## 2013-10-02 LAB — COMPREHENSIVE METABOLIC PANEL
ALBUMIN: 3.8 g/dL (ref 3.5–5.2)
ALK PHOS: 97 U/L (ref 39–117)
ALT: 41 U/L (ref 0–53)
AST: 32 U/L (ref 0–37)
BUN: 23 mg/dL (ref 6–23)
CO2: 27 mEq/L (ref 19–32)
Calcium: 9.8 mg/dL (ref 8.4–10.5)
Chloride: 100 mEq/L (ref 96–112)
Creatinine, Ser: 1.2 mg/dL (ref 0.50–1.35)
GFR calc Af Amer: 78 mL/min — ABNORMAL LOW (ref >90)
GFR calc non Af Amer: 67 mL/min — ABNORMAL LOW (ref >90)
Glucose, Bld: 115 mg/dL — ABNORMAL HIGH (ref 70–99)
Potassium: 3.7 mEq/L (ref 3.7–5.3)
Sodium: 142 mEq/L (ref 137–147)
Total Bilirubin: 0.9 mg/dL (ref 0.3–1.2)
Total Protein: 7.1 g/dL (ref 6.0–8.3)

## 2013-10-02 LAB — LIPASE, BLOOD: LIPASE: 31 U/L (ref 11–59)

## 2013-10-02 LAB — TROPONIN I: Troponin I: <0.3 ng/mL (ref <0.30)

## 2013-10-02 MED ORDER — SODIUM CHLORIDE 0.9 % IV BOLUS (SEPSIS)
1000.0000 mL | Freq: Once | INTRAVENOUS | Status: AC
  Administered 2013-10-02: 1000 mL via INTRAVENOUS

## 2013-10-02 MED ORDER — KETOROLAC TROMETHAMINE 30 MG/ML IJ SOLN
30.0000 mg | Freq: Once | INTRAMUSCULAR | Status: DC
  Filled 2013-10-02: qty 1

## 2013-10-02 MED ORDER — ONDANSETRON HCL 4 MG/2ML IJ SOLN
4.0000 mg | Freq: Once | INTRAMUSCULAR | Status: AC
  Administered 2013-10-02: 4 mg via INTRAVENOUS
  Filled 2013-10-02: qty 2

## 2013-10-02 MED ORDER — ONDANSETRON HCL 4 MG PO TABS: 4.0000 mg | ORAL_TABLET | Freq: Three times a day (TID) | ORAL | Status: DC | PRN

## 2013-10-02 NOTE — ED Provider Notes (Signed)
CSN: [id number]     Arrival date & time 10/02/13  1037 History   First MD Initiated Contact with Patient 10/02/13 1047     Chief Complaint  Patient presents with  . Nausea  . Emesis     (Consider location/radiation/quality/duration/timing/severity/associated sxs/prior Treatment) Patient is a 53 y.o. male presenting with vomiting.  Emesis Associated symptoms: chills   Associated symptoms: no diarrhea     Lonnie Skinner is a 53 y.o. male PMH HTN, CAD, NSTEMI s/p DES to mRCA on 2/42/68, stage 2 diastolic dysfunction, HLD, CKD 2-3, who presents with a chief complaint of nausea and vomiting.  Patient reports nausea and NBNB vomiting x5 since 4am. His wife is recovering from a similar illness (nausea, vomiting, diarrhea) and his kids had it last week. Endorses subjective chills and fatigue. He denies chest pain, shortness of breath, abdominal pain, diarrhea. He was able to hold down some Gatorade this morning.  He reports that he was bit by a tick yesterday on his right buttocks. His wife helped him remove it and they applied rubbing alcohol. She actually saved the tick and is going to bring it to the ED today for testing (?).    Past Medical History  Diagnosis Date  . Hypertension   . Hypercholesteremia   . CAD (coronary artery disease)     a. 05/2013: NSTEMI s/p DES to mRCA (culprit vessel), residual moderate LAD disease (the patient fails medical therapy and this was found to be significant, this would be amenable to PCI).  . Hyperglycemia   . CKD (chronic kidney disease), stage II    Past Surgical History  Procedure Laterality Date  . Coronary stent placement  05/2013    mRCA   Family History  Problem Relation Age of Onset  . CAD      Multiple family members with CABG   History  Substance Use Topics  . Smoking status: Former Research scientist (life sciences)  . Smokeless tobacco: Not on file     Comment: Quit ~2007  . Alcohol Use: No    Review of Systems  Constitutional: Positive for chills and  fatigue. Negative for fever.  Respiratory: Negative for shortness of breath.   Cardiovascular: Negative for chest pain.  Gastrointestinal: Positive for nausea and vomiting. Negative for diarrhea.  Neurological: Negative for weakness and numbness.  Psychiatric/Behavioral: The patient is nervous/anxious.       Allergies  Review of patient's allergies indicates no known allergies.  Home Medications   Prior to Admission medications   Medication Sig Start Date End Date Taking? Authorizing Provider  aspirin EC 81 MG EC tablet Take 1 tablet (81 mg total) by mouth daily. 06/03/13   Dayna N Dunn, PA-C  atorvastatin (LIPITOR) 40 MG tablet Take 1 tablet (40 mg total) by mouth every evening. 06/03/13   Dayna N Dunn, PA-C  carvedilol (COREG) 6.25 MG tablet Take 1 tablet (6.25 mg total) by mouth 2 (two) times daily with a meal. 06/03/13   Dayna N Dunn, PA-C  hydrochlorothiazide (HYDRODIURIL) 25 MG tablet Take 25 mg by mouth daily.    Historical Provider, MD  nitroGLYCERIN (NITROSTAT) 0.4 MG SL tablet Place 1 tablet (0.4 mg total) under the tongue every 5 (five) minutes as needed for chest pain (up to 3 doses). 06/03/13   Dayna N Dunn, PA-C  prasugrel (EFFIENT) 10 MG TABS tablet Take 1 tablet (10 mg total) by mouth daily. 06/03/13   Dayna N Dunn, PA-C   BP 118/81  Temp(Src) 98.1 F (36.7  C)  Resp 18  SpO2 99% Physical Exam  Constitutional: He is oriented to person, place, and time. He appears well-developed and well-nourished.  Patient is tearful, "I feel so sick"  HENT:  Head: Normocephalic and atraumatic.  Eyes: Conjunctivae and EOM are normal. Pupils are equal, round, and reactive to light.  Neck: Normal range of motion. Neck supple.  Cardiovascular: Normal rate, regular rhythm and normal heart sounds.  Exam reveals no gallop and no friction rub.   No murmur heard. Pulmonary/Chest: Effort normal and breath sounds normal. No respiratory distress. He has no wheezes. He has no rales. He exhibits no  tenderness.  Abdominal: Soft. Bowel sounds are normal. He exhibits no distension and no mass. There is tenderness (Mild diffuse tenderness). There is no rebound and no guarding.  Musculoskeletal: Normal range of motion. He exhibits no edema and no tenderness.  Neurological: He is alert and oriented to person, place, and time. No cranial nerve deficit.  Skin: Skin is warm and dry. Lesion noted.     Psychiatric:  Anxious, tearful.    ED Course  Procedures (including critical care time) Labs Review Labs Reviewed  COMPREHENSIVE METABOLIC PANEL - Abnormal; Notable for the following:    Glucose, Bld 115 (*)    GFR calc non Af Amer 67 (*)    GFR calc Af Amer 78 (*)    All other components within normal limits  CBC WITH DIFFERENTIAL - Abnormal; Notable for the following:    WBC 10.6 (*)    MCHC 36.6 (*)    Neutrophils Relative % 90 (*)    Neutro Abs 9.5 (*)    Lymphocytes Relative 6 (*)    Lymphs Abs 0.6 (*)    All other components within normal limits  LIPASE, BLOOD  TROPONIN I    Imaging Review No results found.   EKG Interpretation None     Rate 90, normal sinus rhythm, no concerning ST or T wave changes.  MDM   Final diagnoses:  Gastroenteritis, infectious    Lonnie Skinner is a 53 y.o. male PMH HTN, CAD, NSTEMI s/p DES to [facility] on 1/95/09, stage 2 diastolic dysfunction, HLD, CKD 2-3, who presents with a chief complaint of nausea and vomiting. AVSS, he is afebrile.  Symptoms most likely represent a viral gastroenteritis given sick contacts at home and short duration of symptoms. He does have a significant cardiac history so we will perform EKG and troponin. Will obtain basic labs including CMP and lipase. We'll give 1 L normal saline bolus, Zofran for nausea, Toradol for pain.  12:30PM EKG is normal. Lab work is unremarkable. Lipase within normal limits. Troponin negative. He has a very mild leukocytosis at 10.6. His wife is now also in the ED for vomiting/diarrhea.  Patient asks if we can send the tick to the lab for testing. I told him we cannot do that, and I do not think his symptoms are related to the tick bite, but I would be happy to provide literature on the warning signs of tick-borne diseases.   He is drinking ice water and has had no vomiting since arrival. Stable for discharge.  Lesly Dukes, MD 10/02/13 [phone]

## 2013-10-02 NOTE — Discharge Instructions (Signed)
It was a pleasure taking care of you. - Please drink lots of fluids. You likely have a viral infection that will get better with time and hydration. - I have prescribed nausea medicine for you to take at home. - Please establish with a PCP, I have provided information above. - I have attached information on tick-borne diseases. I do not think your current symptoms are in any way related to your tick bite yesterday. - If you develop worsening abdominal pain, fever, chills, chest pain, shortness of breath please return to the ED.

## 2013-10-02 NOTE — ED Provider Notes (Signed)
I saw and evaluated the patient, reviewed the resident's note and I agree with the findings and plan. Patient is a 53 year old male who presents with complaints of nausea and vomiting that started this morning. He denies any abdominal pain or diarrhea. He does report that several members of his family are ill in a similar fashion. He also reports having had a tick removed off of his back this morning prior to the onset of symptoms.  On exam, vitals are stable and the patient is afebrile. Head is atraumatic normocephalic neck is supple. Mucous membranes are moist. Heart regular rate and rhythm and lungs are clear. Abdomen is soft, nontender, nondistended. Extremities are without edema. Skin has good turgor.  The patient's presentation, physical exam, and workup are all consistent with a viral gastroenteritis. He was given IV fluids, Toradol, and Zofran and is feeling much better. Laboratory studies offered no alternate diagnosis. He will be discharged to home with when necessary return.   Date: 10/02/2013  Rate: 90  Rhythm: normal sinus rhythm  QRS Axis: normal  Intervals: normal  ST/T Wave abnormalities: normal  Conduction Disutrbances:none  Narrative Interpretation:   Old EKG Reviewed: unchanged      Veryl Speak, MD 10/02/13 1547

## 2013-10-02 NOTE — ED Notes (Signed)
Pt here from home with of n/v , pt states that he was bit by tick yesterday and developed some nausea and vomiting , pt has been able to keep down some Gatorade since his first episode of n/v

## 2013-10-05 ENCOUNTER — Encounter (HOSPITAL_COMMUNITY): Payer: Self-pay

## 2013-10-07 ENCOUNTER — Encounter (HOSPITAL_COMMUNITY): Payer: Self-pay

## 2013-10-08 ENCOUNTER — Telehealth: Payer: Self-pay | Admitting: Cardiology

## 2013-10-08 ENCOUNTER — Ambulatory Visit: Payer: Medicare Other | Admitting: Cardiology

## 2013-10-08 NOTE — Telephone Encounter (Signed)
ERROR// See appt notes for 10/08/2013//SR

## 2013-10-09 ENCOUNTER — Encounter (HOSPITAL_COMMUNITY): Payer: Self-pay

## 2013-10-14 ENCOUNTER — Encounter (HOSPITAL_COMMUNITY): Payer: Self-pay

## 2013-10-16 ENCOUNTER — Encounter (HOSPITAL_COMMUNITY): Payer: Self-pay

## 2013-10-19 ENCOUNTER — Encounter (HOSPITAL_COMMUNITY): Payer: Self-pay

## 2013-10-21 ENCOUNTER — Encounter (HOSPITAL_COMMUNITY): Payer: Self-pay

## 2013-10-23 ENCOUNTER — Encounter (HOSPITAL_COMMUNITY): Payer: Self-pay

## 2013-10-23 ENCOUNTER — Ambulatory Visit: Payer: Medicare Other | Admitting: Cardiology

## 2013-10-26 ENCOUNTER — Encounter (HOSPITAL_COMMUNITY): Payer: Self-pay

## 2013-10-28 ENCOUNTER — Ambulatory Visit: Payer: Medicare Other | Admitting: Cardiology

## 2013-10-28 ENCOUNTER — Encounter (HOSPITAL_COMMUNITY): Payer: Self-pay

## 2013-10-30 ENCOUNTER — Encounter (HOSPITAL_COMMUNITY): Payer: Self-pay

## 2013-11-02 ENCOUNTER — Encounter (HOSPITAL_COMMUNITY): Payer: Self-pay

## 2013-11-04 ENCOUNTER — Encounter (HOSPITAL_COMMUNITY): Payer: Self-pay

## 2013-11-05 ENCOUNTER — Other Ambulatory Visit: Payer: Self-pay

## 2013-11-05 MED ORDER — ATORVASTATIN CALCIUM 40 MG PO TABS: 40.0000 mg | ORAL_TABLET | Freq: Every evening | ORAL | Status: DC

## 2013-11-05 MED ORDER — CARVEDILOL 6.25 MG PO TABS: 6.2500 mg | ORAL_TABLET | Freq: Two times a day (BID) | ORAL | Status: DC

## 2013-11-06 ENCOUNTER — Encounter (HOSPITAL_COMMUNITY): Payer: Self-pay

## 2013-11-09 ENCOUNTER — Encounter (HOSPITAL_COMMUNITY): Payer: Self-pay

## 2013-11-11 ENCOUNTER — Encounter (HOSPITAL_COMMUNITY): Payer: Self-pay

## 2013-11-13 ENCOUNTER — Encounter (HOSPITAL_COMMUNITY): Payer: Self-pay

## 2013-11-16 ENCOUNTER — Encounter (HOSPITAL_COMMUNITY): Payer: Self-pay

## 2013-11-18 ENCOUNTER — Encounter (HOSPITAL_COMMUNITY): Payer: Self-pay

## 2013-11-19 ENCOUNTER — Encounter: Payer: Self-pay | Admitting: Cardiology

## 2013-11-19 ENCOUNTER — Ambulatory Visit (INDEPENDENT_AMBULATORY_CARE_PROVIDER_SITE_OTHER): Payer: Medicare Other | Admitting: Cardiology

## 2013-11-19 VITALS — BP 132/92 | HR 66 | Ht 67.0 in | Wt 218.0 lb

## 2013-11-19 DIAGNOSIS — I2119 ST elevation (STEMI) myocardial infarction involving other coronary artery of inferior wall: Secondary | ICD-10-CM

## 2013-11-19 MED ORDER — ISOSORBIDE MONONITRATE ER 30 MG PO TB24: 30.0000 mg | ORAL_TABLET | Freq: Every day | ORAL | Status: DC

## 2013-11-19 NOTE — Patient Instructions (Signed)
Your physician has recommended you make the following change in your medication:   STOP TAKING YOUR HCTZ NOW   START TAKING IMDUR 30 MG DAILY   Your physician wants you to follow-up in: [place] will receive a reminder letter in the mail two months in advance. If you don't receive a letter, please call our office to schedule the follow-up appointment.

## 2013-11-19 NOTE — Progress Notes (Signed)
Patient ID: Lonnie Skinner, male   DOB: [birth year], 53 y.o.   MRN: [id number]    History of Present Illness: Lonnie Skinner is seen back today for a post hospital/TOC visit - seen for Dr. [name]. He has HTN, HLD, stage II-III CKD and hyperglycemia. No prior cardiac history. Followed at the [place].   Most recently with CAD/NSTEMI - s/p DES to the mRCA, residual moderate LAD disease (if fails on medical therapy would proceed with PCI). For outpatient echo next week.   Comes back today. Here with his wife. He is doing ok. No chest pain. Not short of breath. Taking his medicines. Wanting to start doing more. He is limited by back and knee pain - disabled from the [employer]. Did have some constipation after he got home - was straining - some blood in the toilet noted. Has stopped now. Thinks his bowels are ok now.   Today is 6 week follow up, he denies chest pain, SOB. He still hasn't started cardiac rehabilitation. He is complaint with his meds and healthy diet.  11/19/2013 - the patient never started rehabilitation instead he shows silver sneakers however is not exercising. He denies any chest pain. He has a lot of unhealthy habits including eating fried food, or ice cream and soda. His also complaining of is the bathroom a lot at night.  No Known Allergies  Past Medical History  Diagnosis Date  . Hypertension   . Hypercholesteremia   . CAD (coronary artery disease)     a. 05/2013: NSTEMI s/p DES to mRCA (culprit vessel), residual moderate LAD disease (the patient fails medical therapy and this was found to be significant, this would be amenable to PCI).  . Hyperglycemia   . CKD (chronic kidney disease), stage II     Past Surgical History  Procedure Laterality Date  . Coronary stent placement  05/2013    mRCA    History  Smoking status  . Former Smoker  Smokeless tobacco  . Not on file    Comment: Quit ~2007    History  Alcohol Use No    Family History  Problem Relation Age of Onset  .  CAD      Multiple family members with CABG    Review of Systems: The review of systems is per the HPI.  All other systems were reviewed and are negative.  Physical Exam: BP 132/92  Pulse 66  Ht 5\' 7"  (1.702 m)  Wt 218 lb (98.884 kg)  BMI 34.14 kg/m2 Patient is alert and in no acute distress. Does seem just a little slow mentally. Skin is warm and dry. Color is normal.  HEENT is unremarkable but with missing teeth.  Normocephalic/atraumatic. PERRL. Sclera are nonicteric. Neck is supple. No masses. No JVD. Lungs are clear. Cardiac exam shows a regular rate and rhythm. Abdomen is soft. Extremities are without edema. Gait and ROM are intact. No gross neurologic deficits noted. Right arm looks ok.   LABORATORY DATA: PENDING  Lab Results  Component Value Date   WBC 10.6* 10/02/2013   HGB 16.0 10/02/2013   HCT 43.7 10/02/2013   PLT 202 10/02/2013   GLUCOSE 115* 10/02/2013   CHOL 119 07/22/2013   TRIG 203.0* 07/22/2013   HDL 36.40* 07/22/2013   LDLCALC 42 07/22/2013   ALT 41 10/02/2013   AST 32 10/02/2013   NA 142 10/02/2013   K 3.7 10/02/2013   CL 100 10/02/2013   CREATININE 1.20 10/02/2013   BUN 23 10/02/2013  CO2 27 10/02/2013   TSH 3.05 05/19/2009   INR 1.05 06/01/2013   HGBA1C 5.7 06/10/2013    Lab Results  Component Value Date   TROPONINI <0.30 10/02/2013    ANGIOGRAPHIC DATA: The left main coronary artery is absent. There appear to be separate ostia of the LAD and circumflex.  The left anterior descending artery is a large vessel which reaches the apex. In the mid vessel, there is a 50-70% lesion in the mid LAD. There is mild to moderate diffuse disease. There 2 large diagonals which are widely patent. The apical LAD is small but patent.  The left circumflex artery is a medium size vessel. There appears to be some vasospasm proximally. There is a large first marginal which is patent. The second marginal is a larger vessel which branches across the lateral wall. This appears widely patent.    The right coronary artery is a large dominant vessel. In the mid vessel, there is a focal 99% stenosis with visible thrombus. The posterior lateral artery is very large and widely patent. The posterior descending artery is widely patent.  LEFT VENTRICULOGRAM: Left ventricular angiogram was not done. LVEDP was 13 mmHg.  PCI NARRATIVE: A JR 4 guiding catheters u used to engage the RCA. Angiomax was used for anticoagulation. Initially, a pro-water wire was advanced but would not cross the lesion. A Fielder XT density cross the lesion in the mid right coronary artery. A 2.5 x 12 balloon was used to predilate the lesion. A 2.75 x 16 promise drug-eluting stent was then deployed. The stent was post dilated with a 3.25 x 12 noncompliant balloon. There is an excellent angiographic result. There is no residual stenosis. There were several areas of vasospasm during intervention noted in the distal right and posterior lateral artery. Both resolved with intracoronary nitroglycerin.   Echo 06/18/2013 Left ventricle: The cavity size was normal. There was mild focal basal hypertrophy of the septum. Systolic function was normal. The estimated ejection fraction was in the range of 55% to 60%. Wall motion was normal; there were no regional wall motion abnormalities. Features are consistent with a pseudonormal left ventricular filling pattern, with concomitant abnormal relaxation and increased filling pressure (grade 2 diastolic dysfunction).    IMPRESSIONS:  1. Absent left main coronary artery. There are separate ostia of the LAD and circumflex 2. Moderate lesion in the mid left anterior descending artery. If the patient fails medical therapy and this was found to be significant, this would be amenable to percutaneous revascularization. 3. Mild disease in the left circumflex artery and its branches. 4. Large, dominant right coronary artery with a 99% mid vessel stenosis which is responsible for his presentation  today. This was successfully treated with a 2.75 x 16 promus drug-eluting stent, postdilated to 3.3 mm in diameter. 5. LVEDP 13 mmHg.  RECOMMENDATION: Continue dual antiplatelet therapy for at least a year. I stressed the importance of preventive therapy and is taking his antiplatelet therapy to prevent stent thrombosis. Continue aggressive medical therapy and antianginal therapy given the moderate disease in his LAD. If he has refractory symptoms, further ischemia evaluation is warranted. LV gram not performed to save contrast. Consider echocardiogram.      Assessment / Plan:  1. CAD/NSTEMI with PCI with DES to the mid RCA with residual moderate LAD disease - I think he is doing well. No symptoms reported. Will check on status with cardiac rehab so he can start ASAP. Echo showed preserved LVEF, but stage 2 diastolic dysfunction  and elevated filling pressures.   The patient is asymptomatic, we'll continue the same regimen, we discussed healthy eating habits, and  importance of regular exercise  2. HTN - BP borderline however he hasn't taken his meds today yet. We'll stop hydrochlorothiazide to avoid urination at night. Start finger surgery milligrams daily.  3. CKD - improved with creatinine 1.2, previously 1.4. We are stopping hydrochlorothiazide.  4. HLD - on statin - normal liver enzymes in May 2015.  Follow up in 6 months.    Dorothy Spark 11/19/2013

## 2013-11-23 ENCOUNTER — Encounter (HOSPITAL_COMMUNITY): Payer: Self-pay

## 2013-11-25 ENCOUNTER — Encounter (HOSPITAL_COMMUNITY): Payer: Self-pay

## 2013-11-27 ENCOUNTER — Encounter (HOSPITAL_COMMUNITY): Payer: Self-pay

## 2013-11-30 ENCOUNTER — Encounter (HOSPITAL_COMMUNITY): Payer: Self-pay

## 2013-12-02 ENCOUNTER — Encounter (HOSPITAL_COMMUNITY): Payer: Self-pay

## 2013-12-04 ENCOUNTER — Encounter (HOSPITAL_COMMUNITY): Payer: Self-pay

## 2013-12-07 ENCOUNTER — Encounter (HOSPITAL_COMMUNITY): Payer: Self-pay

## 2013-12-09 ENCOUNTER — Encounter (HOSPITAL_COMMUNITY): Payer: Self-pay

## 2013-12-11 ENCOUNTER — Encounter (HOSPITAL_COMMUNITY): Payer: Self-pay

## 2013-12-14 ENCOUNTER — Encounter (HOSPITAL_COMMUNITY): Payer: Self-pay

## 2013-12-16 ENCOUNTER — Encounter (HOSPITAL_COMMUNITY): Payer: Self-pay

## 2013-12-18 ENCOUNTER — Encounter (HOSPITAL_COMMUNITY): Payer: Self-pay

## 2013-12-21 ENCOUNTER — Encounter (HOSPITAL_COMMUNITY): Payer: Self-pay

## 2013-12-23 ENCOUNTER — Encounter (HOSPITAL_COMMUNITY): Payer: Self-pay

## 2013-12-25 ENCOUNTER — Encounter (HOSPITAL_COMMUNITY): Payer: Self-pay

## 2013-12-28 ENCOUNTER — Encounter (HOSPITAL_COMMUNITY): Payer: Self-pay

## 2013-12-30 ENCOUNTER — Encounter (HOSPITAL_COMMUNITY): Payer: Self-pay

## 2014-01-01 ENCOUNTER — Encounter (HOSPITAL_COMMUNITY): Payer: Self-pay

## 2014-01-04 ENCOUNTER — Encounter (HOSPITAL_COMMUNITY): Payer: Self-pay

## 2014-01-06 ENCOUNTER — Encounter (HOSPITAL_COMMUNITY): Payer: Self-pay

## 2014-01-08 ENCOUNTER — Encounter (HOSPITAL_COMMUNITY): Payer: Self-pay

## 2014-01-11 ENCOUNTER — Encounter (HOSPITAL_COMMUNITY): Payer: Self-pay

## 2014-01-13 ENCOUNTER — Encounter (HOSPITAL_COMMUNITY): Payer: Self-pay

## 2014-01-15 ENCOUNTER — Encounter (HOSPITAL_COMMUNITY): Payer: Self-pay

## 2014-01-18 ENCOUNTER — Encounter (HOSPITAL_COMMUNITY): Payer: Self-pay

## 2014-01-20 ENCOUNTER — Encounter (HOSPITAL_COMMUNITY): Payer: Self-pay

## 2014-01-22 ENCOUNTER — Encounter (HOSPITAL_COMMUNITY): Payer: Self-pay

## 2014-01-27 ENCOUNTER — Encounter (HOSPITAL_COMMUNITY): Payer: Self-pay

## 2014-01-29 ENCOUNTER — Encounter (HOSPITAL_COMMUNITY): Payer: Self-pay

## 2014-02-01 ENCOUNTER — Encounter (HOSPITAL_COMMUNITY): Payer: Self-pay

## 2014-02-03 ENCOUNTER — Encounter (HOSPITAL_COMMUNITY): Payer: Self-pay

## 2014-02-05 ENCOUNTER — Encounter (HOSPITAL_COMMUNITY): Payer: Self-pay

## 2014-02-08 ENCOUNTER — Encounter (HOSPITAL_COMMUNITY): Payer: Self-pay

## 2014-02-10 ENCOUNTER — Encounter (HOSPITAL_COMMUNITY): Payer: Self-pay

## 2014-02-12 ENCOUNTER — Encounter (HOSPITAL_COMMUNITY): Payer: Self-pay

## 2014-02-15 ENCOUNTER — Encounter (HOSPITAL_COMMUNITY): Payer: Self-pay

## 2014-02-17 ENCOUNTER — Encounter (HOSPITAL_COMMUNITY): Payer: Self-pay

## 2014-03-21 ENCOUNTER — Other Ambulatory Visit: Payer: Self-pay

## 2014-03-21 MED ORDER — PRASUGREL HCL 10 MG PO TABS: 10.0000 mg | ORAL_TABLET | Freq: Every day | ORAL | Status: DC

## 2014-04-29 ENCOUNTER — Encounter (HOSPITAL_COMMUNITY): Payer: Self-pay | Admitting: Interventional Cardiology

## 2014-05-07 ENCOUNTER — Ambulatory Visit: Payer: Medicare Other | Admitting: Cardiology

## 2014-05-10 ENCOUNTER — Encounter: Payer: Self-pay | Admitting: Cardiology

## 2014-06-29 ENCOUNTER — Ambulatory Visit: Payer: Self-pay | Admitting: Cardiology

## 2014-07-16 ENCOUNTER — Other Ambulatory Visit: Payer: Self-pay | Admitting: *Deleted

## 2014-07-16 MED ORDER — ATORVASTATIN CALCIUM 40 MG PO TABS: 40.0000 mg | ORAL_TABLET | Freq: Every evening | ORAL | Status: DC

## 2014-07-20 ENCOUNTER — Ambulatory Visit: Payer: Self-pay | Admitting: Cardiology

## 2014-08-11 ENCOUNTER — Ambulatory Visit: Payer: Self-pay | Admitting: Cardiology

## 2014-08-25 ENCOUNTER — Other Ambulatory Visit: Payer: Self-pay | Admitting: *Deleted

## 2014-08-25 MED ORDER — CARVEDILOL 6.25 MG PO TABS: 6.2500 mg | ORAL_TABLET | Freq: Two times a day (BID) | ORAL | Status: DC

## 2014-09-15 ENCOUNTER — Ambulatory Visit (INDEPENDENT_AMBULATORY_CARE_PROVIDER_SITE_OTHER): Payer: Medicare Other | Admitting: Cardiology

## 2014-09-15 ENCOUNTER — Telehealth: Payer: Self-pay | Admitting: *Deleted

## 2014-09-15 ENCOUNTER — Encounter: Payer: Self-pay | Admitting: Cardiology

## 2014-09-15 VITALS — BP 124/80 | HR 63 | Ht 67.0 in | Wt 230.0 lb

## 2014-09-15 DIAGNOSIS — I25119 Atherosclerotic heart disease of native coronary artery with unspecified angina pectoris: Secondary | ICD-10-CM

## 2014-09-15 DIAGNOSIS — I214 Non-ST elevation (NSTEMI) myocardial infarction: Secondary | ICD-10-CM | POA: Diagnosis not present

## 2014-09-15 DIAGNOSIS — I10 Essential (primary) hypertension: Secondary | ICD-10-CM | POA: Diagnosis not present

## 2014-09-15 DIAGNOSIS — E785 Hyperlipidemia, unspecified: Secondary | ICD-10-CM | POA: Diagnosis not present

## 2014-09-15 DIAGNOSIS — R7989 Other specified abnormal findings of blood chemistry: Secondary | ICD-10-CM

## 2014-09-15 LAB — CBC WITH DIFFERENTIAL/PLATELET
Basophils Absolute: 0 10*3/uL (ref 0.0–0.1)
Basophils Relative: 0.3 % (ref 0.0–3.0)
Eosinophils Absolute: 0.2 10*3/uL (ref 0.0–0.7)
Eosinophils Relative: 1.9 % (ref 0.0–5.0)
HCT: 45.4 % (ref 39.0–52.0)
Hemoglobin: 15.6 g/dL (ref 13.0–17.0)
Lymphocytes Relative: 31 % (ref 12.0–46.0)
Lymphs Abs: 2.7 10*3/uL (ref 0.7–4.0)
MCHC: 34.3 g/dL (ref 30.0–36.0)
MCV: 89.3 fl (ref 78.0–100.0)
Monocytes Absolute: 0.7 10*3/uL (ref 0.1–1.0)
Monocytes Relative: 8.3 % (ref 3.0–12.0)
Neutro Abs: 5.1 10*3/uL (ref 1.4–7.7)
Neutrophils Relative %: 58.5 % (ref 43.0–77.0)
Platelets: 232 10*3/uL (ref 150.0–400.0)
RBC: 5.08 Mil/uL (ref 4.22–5.81)
RDW: 13.5 % (ref 11.5–15.5)
WBC: 8.6 10*3/uL (ref 4.0–10.5)

## 2014-09-15 LAB — COMPREHENSIVE METABOLIC PANEL
ALT: 29 U/L (ref 0–53)
AST: 24 U/L (ref 0–37)
Albumin: 4.1 g/dL (ref 3.5–5.2)
Alkaline Phosphatase: 110 U/L (ref 39–117)
BUN: 14 mg/dL (ref 6–23)
CO2: 30 mEq/L (ref 19–32)
Calcium: 9.5 mg/dL (ref 8.4–10.5)
Chloride: 102 mEq/L (ref 96–112)
Creatinine, Ser: 1.71 mg/dL — ABNORMAL HIGH (ref 0.40–1.50)
GFR: 44.49 mL/min — ABNORMAL LOW (ref >60.00)
Glucose, Bld: 101 mg/dL — ABNORMAL HIGH (ref 70–99)
Potassium: 4.2 mEq/L (ref 3.5–5.1)
Sodium: 137 mEq/L (ref 135–145)
Total Bilirubin: 0.8 mg/dL (ref 0.2–1.2)
Total Protein: 6.5 g/dL (ref 6.0–8.3)

## 2014-09-15 LAB — TSH: TSH: 2.16 u[IU]/mL (ref 0.35–4.50)

## 2014-09-15 LAB — LIPID PANEL
Cholesterol: 98 mg/dL (ref 0–200)
HDL: 33.5 mg/dL — ABNORMAL LOW (ref >39.00)
LDL Cholesterol: 41 mg/dL (ref 0–99)
NonHDL: 64.5
Total CHOL/HDL Ratio: 3
Triglycerides: 120 mg/dL (ref 0.0–149.0)
VLDL: 24 mg/dL (ref 0.0–40.0)

## 2014-09-15 NOTE — Telephone Encounter (Signed)
Contacted the pt to inform him that per Dr [name] his cholesterol is good and all the other labs are normal, except for creatinine has worsened.  Informed the pt that per Dr [name] he should avoid NSAIDS, and increase his fluid intake and follow-up with nephrology.  Informed the pt that we will place the nephrology referral in the system and someone from our scheduling dept will contact him to aid in arranging this appt.  Pt verbalized understanding and agrees with this plan.

## 2014-09-15 NOTE — Telephone Encounter (Signed)
-----   Message from Dorothy Spark, MD sent at 09/15/2014  4:35 PM EDT ----- His cholesterol is good and all the other labs are normal, except for Crea that has worsened. He should avoind NSAIDs, increase fluid intake and follow up with nephrology. We will refer.

## 2014-09-15 NOTE — Patient Instructions (Signed)
Your physician recommends that you continue on your current medications as directed. Please refer to the Current Medication list given to you today.     Your physician recommends that you return for lab work in: TODAY----CMET, CBC W DIFF, TSH, LIPIDS    Your physician wants you to follow-up in: [place] will receive a reminder letter in the mail two months in advance. If you don't receive a letter, please call our office to schedule the follow-up appointment.

## 2014-09-15 NOTE — Progress Notes (Signed)
Patient ID: Lonnie Skinner, male   DOB: 04/15/1961, 54 y.o.   MRN: [id number]    History of Present Illness: Lonnie Skinner is seen back today for a post hospital/TOC visit - seen for Dr. [name]. He has HTN, HLD, stage II-III CKD and hyperglycemia. No prior cardiac history. Followed at the [place].   Most recently with CAD/NSTEMI - s/p DES to the mRCA, residual moderate LAD disease (if fails on medical therapy would proceed with PCI). For outpatient echo next week.   Comes back today. Here with his wife. He is doing ok. No chest pain. Not short of breath. Taking his medicines. Wanting to start doing more. He is limited by back and knee pain - disabled from the [employer]. Did have some constipation after he got home - was straining - some blood in the toilet noted. Has stopped now. Thinks his bowels are ok now.   Today is 6 week follow up, he denies chest pain, SOB. He still hasn't started cardiac rehabilitation. He is complaint with his meds and healthy diet.  11/19/2013 - the patient never started rehabilitation instead he shows silver sneakers however is not exercising. He denies any chest pain. He has a lot of unhealthy habits including eating fried food, or ice cream and soda. His also complaining of is the bathroom a lot at night.  09/15/2014 - the patient is coming after 9 months, he states he has changed his diet completely and try to avoid fried food. He has been very compliant to his meds and the ninth any bleeding muscle or joint pain. He doesn't exercise at all and didn't follow with rehabilitation referral. He denies any chest pain or shortness of breath. No LE edema, no orthopnea, no PND.    No Known Allergies  Past Medical History  Diagnosis Date  . Hypertension   . Hypercholesteremia   . CAD (coronary artery disease)     a. 05/2013: NSTEMI s/p DES to mRCA (culprit vessel), residual moderate LAD disease (the patient fails medical therapy and this was found to be significant, this would be  amenable to PCI).  . Hyperglycemia   . CKD (chronic kidney disease), stage II     Past Surgical History  Procedure Laterality Date  . Coronary stent placement  05/2013    mRCA  . Left heart catheterization with coronary angiogram N/A 06/02/2013    Procedure: LEFT HEART CATHETERIZATION WITH CORONARY ANGIOGRAM;  Surgeon: [name], MD;  Location[facility] C[facility] LAB;  Service: Cardiovascular;  Laterality: N/A;  . Percutaneous coronary stent intervention (pci-s)  06/02/2013    Procedure: PERCUTANEOUS CORONARY STENT INTERVENTION (PCI-S);  Surgeon: [name], MD;  Location: [facility] CATH LAB;  Service: Cardiovascular;;    History  Smoking status  . Former Smoker  Smokeless tobacco  . Not on file    Comment: Quit ~2007    History  Alcohol Use No    Family History  Problem Relation Age of Onset  . CAD      Multiple family members with CABG    Review of Systems: The review of systems is per the HPI.  All other systems were reviewed and are negative.  Physical Exam: BP 124/80 mmHg  Pulse 63  Ht 5\' 7"  (1.702 m)  Wt 230 lb (104.327 kg)  BMI 36.01 kg/m2 Patient is alert and in no acute distress. Does seem just a little slow mentally. Skin is warm and dry. Color is normal.  HEENT is unremarkable but with missing  teeth.  Normocephalic/atraumatic. PERRL. Sclera are nonicteric. Neck is supple. No masses. No JVD. Lungs are clear. Cardiac exam shows a regular rate and rhythm. Abdomen is soft. Extremities are without edema. Gait and ROM are intact. No gross neurologic deficits noted. Right arm looks ok.   LABORATORY DATA: PENDING  Lab Results  Component Value Date   WBC 10.6* 10/02/2013   HGB 16.0 10/02/2013   HCT 43.7 10/02/2013   PLT 202 10/02/2013   GLUCOSE 115* 10/02/2013   CHOL 119 07/22/2013   TRIG 203.0* 07/22/2013   HDL 36.40* 07/22/2013   LDLCALC 42 07/22/2013   ALT 41 10/02/2013   AST 32 10/02/2013   NA 142 10/02/2013   K 3.7 10/02/2013   CL 100 10/02/2013    CREATININE 1.20 10/02/2013   BUN 23 10/02/2013   CO2 27 10/02/2013   TSH 3.05 05/19/2009   INR 1.05 06/01/2013   HGBA1C 5.7 06/10/2013    Lab Results  Component Value Date   TROPONINI <0.30 10/02/2013    ANGIOGRAPHIC DATA: The left main coronary artery is absent. There appear to be separate ostia of the LAD and circumflex.  The left anterior descending artery is a large vessel which reaches the apex. In the mid vessel, there is a 50-70% lesion in the mid LAD. There is mild to moderate diffuse disease. There 2 large diagonals which are widely patent. The apical LAD is small but patent.  The left circumflex artery is a medium size vessel. There appears to be some vasospasm proximally. There is a large first marginal which is patent. The second marginal is a larger vessel which branches across the lateral wall. This appears widely patent.  The right coronary artery is a large dominant vessel. In the mid vessel, there is a focal 99% stenosis with visible thrombus. The posterior lateral artery is very large and widely patent. The posterior descending artery is widely patent.  LEFT VENTRICULOGRAM: Left ventricular angiogram was not done. LVEDP was 13 mmHg.  PCI NARRATIVE: A JR 4 guiding catheters u used to engage the RCA. Angiomax was used for anticoagulation. Initially, a pro-water wire was advanced but would not cross the lesion. A Fielder XT density cross the lesion in the mid right coronary artery. A 2.5 x 12 balloon was used to predilate the lesion. A 2.75 x 16 promise drug-eluting stent was then deployed. The stent was post dilated with a 3.25 x 12 noncompliant balloon. There is an excellent angiographic result. There is no residual stenosis. There were several areas of vasospasm during intervention noted in the distal right and posterior lateral artery. Both resolved with intracoronary nitroglycerin.   Echo 06/18/2013 Left ventricle: The cavity size was normal. There was mild focal basal  hypertrophy of the septum. Systolic function was normal. The estimated ejection fraction was in the range of 55% to 60%. Wall motion was normal; there were no regional wall motion abnormalities. Features are consistent with a pseudonormal left ventricular filling pattern, with concomitant abnormal relaxation and increased filling pressure (grade 2 diastolic dysfunction).    IMPRESSIONS:  1. Absent left main coronary artery. There are separate ostia of the LAD and circumflex 2. Moderate lesion in the mid left anterior descending artery. If the patient fails medical therapy and this was found to be significant, this would be amenable to percutaneous revascularization. 3. Mild disease in the left circumflex artery and its branches. 4. Large, dominant right coronary artery with a 99% mid vessel stenosis which is responsible for his presentation  today. This was successfully treated with a 2.75 x 16 promus drug-eluting stent, postdilated to 3.3 mm in diameter. 5. LVEDP 13 mmHg.  RECOMMENDATION: Continue dual antiplatelet therapy for at least a year. I stressed the importance of preventive therapy and is taking his antiplatelet therapy to prevent stent thrombosis. Continue aggressive medical therapy and antianginal therapy given the moderate disease in his LAD. If he has refractory symptoms, further ischemia evaluation is warranted. LV gram not performed to save contrast. Consider echocardiogram.       Assessment / Plan:  1. CAD/NSTEMI with PCI with DES to the mid RCA with residual moderate LAD disease - asymptomatic, needs to increase exercise capacity, explained. No symptoms reported. Echo showed preserved LVEF, but stage 2 diastolic dysfunction and elevated filling pressures.   The patient is asymptomatic, we'll continue the same regimen, we discussed healthy eating habits, and  importance of regular exercise, encouraged to continue using Effient as long as he can tolerate.   2. HTN - BP  controlled  3. CKD - improved with creatinine 1.2, previously 1.4. We are stopping hydrochlorothiazide.  4. HLD - on statin - normal liver enzymes in May 2015.  Follow up in 1 year, check CMP, CBC, lipids today.     Dorothy Spark 09/15/2014

## 2014-09-16 ENCOUNTER — Telehealth: Payer: Self-pay | Admitting: Cardiology

## 2014-09-16 NOTE — Telephone Encounter (Signed)
Contacted the pt to ask him answer any questions and concerns regarding his referral to nephrology.  Pt states he will also ask his [place] doctor if this referral is appropriate or not.  Pt also states he is increasing his fluid intake to more water to improve kidney function.  Pt agrees with referral to nephrology and verbalized understanding of referral.  Will send [facility] a message to follow-up with the referral and scheduling this for the pt.

## 2014-09-27 ENCOUNTER — Other Ambulatory Visit: Payer: Self-pay | Admitting: *Deleted

## 2014-09-27 MED ORDER — CARVEDILOL 6.25 MG PO TABS: 6.2500 mg | ORAL_TABLET | Freq: Two times a day (BID) | ORAL | Status: DC

## 2014-09-28 ENCOUNTER — Other Ambulatory Visit: Payer: Self-pay | Admitting: *Deleted

## 2014-09-28 ENCOUNTER — Other Ambulatory Visit: Payer: Self-pay

## 2014-09-28 MED ORDER — CARVEDILOL 6.25 MG PO TABS: 6.2500 mg | ORAL_TABLET | Freq: Two times a day (BID) | ORAL | Status: DC

## 2014-09-28 MED ORDER — PRASUGREL HCL 10 MG PO TABS: 10.0000 mg | ORAL_TABLET | Freq: Every day | ORAL | Status: DC

## 2014-10-08 ENCOUNTER — Telehealth: Payer: Self-pay | Admitting: Cardiology

## 2014-10-08 NOTE — Telephone Encounter (Signed)
[facility] calling to notify us that the patient declined the referral that  Dr. [name] sent to [facility]  and he said he would see some @ the [place].

## 2014-10-12 NOTE — Telephone Encounter (Signed)
Will route this message to Dr [name] for further review of denial.

## 2015-01-13 ENCOUNTER — Other Ambulatory Visit: Payer: Self-pay | Admitting: Cardiology

## 2015-05-16 ENCOUNTER — Other Ambulatory Visit: Payer: Self-pay | Admitting: Cardiology

## 2015-05-20 ENCOUNTER — Emergency Department (INDEPENDENT_AMBULATORY_CARE_PROVIDER_SITE_OTHER)
Admission: EM | Admit: 2015-05-20 | Discharge: 2015-05-20 | Disposition: A | Discharge status: Home / Self Care | Payer: Medicare Other | Source: Home / Self Care

## 2015-05-20 ENCOUNTER — Encounter (HOSPITAL_COMMUNITY): Payer: Self-pay | Admitting: Emergency Medicine

## 2015-05-20 DIAGNOSIS — K047 Periapical abscess without sinus: Secondary | ICD-10-CM | POA: Diagnosis not present

## 2015-05-20 MED ORDER — AMOXICILLIN-POT CLAVULANATE 875-125 MG PO TABS: 1.0000 | ORAL_TABLET | Freq: Two times a day (BID) | ORAL | Status: DC

## 2015-05-20 MED ORDER — HYDROCODONE-ACETAMINOPHEN 5-325 MG PO TABS: 1.0000 | ORAL_TABLET | Freq: Four times a day (QID) | ORAL | Status: DC | PRN

## 2015-05-20 NOTE — Discharge Instructions (Signed)
Dental Caries °Dental caries is tooth decay. This decay can cause a hole in teeth (cavity) that can get bigger and deeper over time. °HOME CARE °· Brush and floss your teeth. Do this at least two times a day. °· Use a fluoride toothpaste. °· Use a mouth rinse if told by your dentist or doctor. °· Eat less sugary and starchy foods. Drink less sugary drinks. °· Avoid snacking often on sugary and starchy foods. Avoid sipping often on sugary drinks. °· Keep regular checkups and cleanings with your dentist. °· Use fluoride supplements if told by your dentist or doctor. °· Allow fluoride to be applied to teeth if told by your dentist or doctor. °  °This information is not intended to replace advice given to you by your health care provider. Make sure you discuss any questions you have with your health care provider. °  °Document Released: 02/14/2008 Document Revised: 05/28/2014 Document Reviewed: 05/09/2012 °Elsevier Interactive Patient Education ©2016 Elsevier Inc. ° °

## 2015-05-20 NOTE — ED Provider Notes (Signed)
CSN: [id number]     Arrival date & time 05/20/15  1946 History   None    Chief Complaint  Patient presents with  . Dental Pain   (Consider location/radiation/quality/duration/timing/severity/associated sxs/prior Treatment) HPI History obtained from patient:   LOCATION:right upper jaw SEVERITY:5 DURATION:3 weeks CONTEXT:"rotten teeth" dental appointment jan 4. QUALITY: MODIFYING FACTORS: was seen at the [place] 2 weeks ago, had one tooth pulled. Needs several others removed.  ASSOCIATED SYMPTOMS:pain into left ear TIMING:constant    Past Medical History  Diagnosis Date  . Hypertension   . Hypercholesteremia   . CAD (coronary artery disease)     a. 05/2013: NSTEMI s/p DES to mRCA (culprit vessel), residual moderate LAD disease (the patient fails medical therapy and this was found to be significant, this would be amenable to PCI).  . Hyperglycemia   . CKD (chronic kidney disease), stage II    Past Surgical History  Procedure Laterality Date  . Coronary stent placement  05/2013    mRCA  . Left heart catheterization with coronary angiogram N/A 06/02/2013    Procedure: LEFT HEART CATHETERIZATION WITH CORONARY ANGIOGRAM;  Surgeon: [name], MD;  Location: [facility] CATH LAB;  Service: Cardiovascular;  Laterality: N/A;  . Percutaneous coronary stent intervention (pci-s)  06/02/2013    Procedure: PERCUTANEOUS CORONARY STENT INTERVENTION (PCI-S);  Surgeon: [name], MD;  Location: [facility] CATH LAB;  Service: Cardiovascular;;   Family History  Problem Relation Age of Onset  . CAD      Multiple family members with CABG   Social History  Substance Use Topics  . Smoking status: Former Research scientist (life sciences)  . Smokeless tobacco: None     Comment: Quit ~2007  . Alcohol Use: No    Review of Systems ROS +'ve dental pain  Denies: HEADACHE, NAUSEA, ABDOMINAL PAIN, CHEST PAIN, CONGESTION, DYSURIA, SHORTNESS OF BREATH  Allergies  Review of patient's allergies indicates no known  allergies.  Home Medications   Prior to Admission medications   Medication Sig Start Date End Date Taking? Authorizing Provider  amoxicillin-clavulanate (AUGMENTIN) 875-125 MG tablet Take 1 tablet by mouth every 12 (twelve) hours. 05/20/15   Konrad Felix, PA  aspirin EC 81 MG EC tablet Take 1 tablet (81 mg total) by mouth daily. 06/03/13   Dayna N Dunn, PA-C  atorvastatin (LIPITOR) 40 MG tablet TAKE 1 TABLET BY MOUTH EVERY EVENING 05/17/15   Dorothy Spark, MD  carvedilol (COREG) 6.25 MG tablet Take 1 tablet (6.25 mg total) by mouth 2 (two) times daily with a meal. 09/28/14   Dorothy Spark, MD  HYDROcodone-acetaminophen (NORCO/VICODIN) 5-325 MG tablet Take 1 tablet by mouth every 6 (six) hours as needed for moderate pain. 05/20/15   Konrad Felix, PA  isosorbide mononitrate (IMDUR) 30 MG 24 hr tablet TAKE 1 TABLET BY MOUTH EVERY DAY 01/13/15   Dorothy Spark, MD  nitroGLYCERIN (NITROSTAT) 0.4 MG SL tablet Place 1 tablet (0.4 mg total) under the tongue every 5 (five) minutes as needed for chest pain (up to 3 doses). 06/03/13   Dayna N Dunn, PA-C  ondansetron (ZOFRAN) 4 MG tablet Take 1 tablet (4 mg total) by mouth every 8 (eight) hours as needed for nausea or vomiting. 10/02/13   Pollie Friar, MD  prasugrel (EFFIENT) 10 MG TABS tablet Take 1 tablet (10 mg total) by mouth daily. 09/28/14   Dorothy Spark, MD   Meds Ordered and Administered this Visit  Medications - No data to display  BP  164/105 mmHg  Pulse 80  Temp(Src) 98.6 F (37 C) (Oral)  Resp 16  SpO2 99% No data found.   Physical Exam  Constitutional: He is oriented to person, place, and time. He appears well-developed and well-nourished.  HENT:  Head: Normocephalic and atraumatic.  Right Ear: External ear normal.  Left Ear: External ear normal.  Mouth/Throat: Dental caries present.    Neck: Normal range of motion. Neck supple.  Pulmonary/Chest: Effort normal.  Lymphadenopathy:    He has no cervical  adenopathy.  Neurological: He is alert and oriented to person, place, and time.  Skin: Skin is warm and dry.  Nursing note and vitals reviewed.   ED Course  Procedures (including critical care time)  Labs Review Labs Reviewed - No data to display  Imaging Review No results found.   Visual Acuity Review  Right Eye Distance:   Left Eye Distance:   Bilateral Distance:    Right Eye Near:   Left Eye Near:    Bilateral Near:         MDM   1. Dental infection    Patient is advised to continue home symptomatic treatment. Prescription for augmentin and norco  sent pharmacy patient has indicated. Patient is advised that if there are new or worsening symptoms or attend the emergency department, or contact primary care provider. Instructions of care provided discharged home in stable condition.  THIS NOTE WAS GENERATED USING A VOICE RECOGNITION SOFTWARE PROGRAM. ALL REASONABLE EFFORTS  WERE MADE TO PROOFREAD THIS DOCUMENT FOR ACCURACY.     Konrad Felix, [place] 05/20/15 2025

## 2015-05-20 NOTE — ED Notes (Signed)
The patient presented to the [facility] with a complaint of dental pain x 1 month.

## 2015-08-18 ENCOUNTER — Other Ambulatory Visit: Payer: Self-pay | Admitting: Cardiology

## 2015-09-29 ENCOUNTER — Other Ambulatory Visit: Payer: Self-pay | Admitting: Cardiology

## 2015-10-27 ENCOUNTER — Other Ambulatory Visit: Payer: Self-pay | Admitting: Cardiology

## 2015-10-28 ENCOUNTER — Telehealth: Payer: Self-pay | Admitting: Cardiology

## 2015-10-28 NOTE — Telephone Encounter (Signed)
Lonnie Skinner is calling from [employer] to authorization on a quanity change. It needs to be dispense in the seal manufacturer container which comes in a bottle of 30. Please call   Thanks

## 2015-10-28 NOTE — Telephone Encounter (Signed)
Ben needed the verbal ok to dispense 30 pills of the pts med Effient, vs 15 pills.  Suezanne Jacquet states this is because of the dispense in the seal manufacturer container, which holds 30 pills of Effient.  Hilton Sinclair the verbal ok to change dispense amount, per Dr [name].  Ben verbalized understanding and gracious for all the assistance provided.

## 2015-12-26 ENCOUNTER — Other Ambulatory Visit: Payer: Self-pay | Admitting: Cardiology

## 2015-12-28 ENCOUNTER — Other Ambulatory Visit: Payer: Self-pay | Admitting: *Deleted

## 2015-12-28 ENCOUNTER — Other Ambulatory Visit: Payer: Self-pay | Admitting: Cardiology

## 2015-12-28 MED ORDER — CARVEDILOL 6.25 MG PO TABS: 6.2500 mg | ORAL_TABLET | Freq: Two times a day (BID) | ORAL | 0 refills | Status: DC

## 2016-02-01 ENCOUNTER — Other Ambulatory Visit: Payer: Self-pay | Admitting: Cardiology

## 2016-02-10 ENCOUNTER — Encounter: Payer: Self-pay | Admitting: Nurse Practitioner

## 2016-02-12 ENCOUNTER — Other Ambulatory Visit: Payer: Self-pay | Admitting: Cardiology

## 2016-02-13 ENCOUNTER — Ambulatory Visit (INDEPENDENT_AMBULATORY_CARE_PROVIDER_SITE_OTHER): Payer: Medicare Other | Admitting: Nurse Practitioner

## 2016-02-13 ENCOUNTER — Encounter: Payer: Self-pay | Admitting: Nurse Practitioner

## 2016-02-13 ENCOUNTER — Encounter (INDEPENDENT_AMBULATORY_CARE_PROVIDER_SITE_OTHER): Payer: Self-pay

## 2016-02-13 VITALS — BP 160/100 | HR 70 | Wt 231.4 lb

## 2016-02-13 DIAGNOSIS — I10 Essential (primary) hypertension: Secondary | ICD-10-CM

## 2016-02-13 DIAGNOSIS — E785 Hyperlipidemia, unspecified: Secondary | ICD-10-CM

## 2016-02-13 DIAGNOSIS — I259 Chronic ischemic heart disease, unspecified: Secondary | ICD-10-CM | POA: Diagnosis not present

## 2016-02-13 LAB — CBC
HCT: 45.7 % (ref 38.5–50.0)
Hemoglobin: 16.2 g/dL (ref 13.2–17.1)
MCH: 31.1 pg (ref 27.0–33.0)
MCHC: 35.4 g/dL (ref 32.0–36.0)
MCV: 87.7 fL (ref 80.0–100.0)
MPV: 10 fL (ref 7.5–12.5)
Platelets: 233 10*3/uL (ref 140–400)
RBC: 5.21 MIL/uL (ref 4.20–5.80)
RDW: 13 % (ref 11.0–15.0)
WBC: 10.1 10*3/uL (ref 3.8–10.8)

## 2016-02-13 LAB — HEPATIC FUNCTION PANEL
ALT: 31 U/L (ref 9–46)
AST: 23 U/L (ref 10–35)
Albumin: 4.1 g/dL (ref 3.6–5.1)
Alkaline Phosphatase: 92 U/L (ref 40–115)
Bilirubin, Direct: 0.1 mg/dL (ref <=0.2)
Indirect Bilirubin: 0.5 mg/dL (ref 0.2–1.2)
Total Bilirubin: 0.6 mg/dL (ref 0.2–1.2)
Total Protein: 7.1 g/dL (ref 6.1–8.1)

## 2016-02-13 LAB — BASIC METABOLIC PANEL
BUN: 20 mg/dL (ref 7–25)
CO2: 29 mmol/L (ref 20–31)
Calcium: 10.1 mg/dL (ref 8.6–10.3)
Chloride: 103 mmol/L (ref 98–110)
Creat: 1.38 mg/dL — ABNORMAL HIGH (ref 0.70–1.33)
Glucose, Bld: 90 mg/dL (ref 65–99)
Potassium: 5.3 mmol/L (ref 3.5–5.3)
Sodium: 138 mmol/L (ref 135–146)

## 2016-02-13 LAB — LIPID PANEL
Cholesterol: 113 mg/dL — ABNORMAL LOW (ref 125–200)
HDL: 37 mg/dL — ABNORMAL LOW (ref >=40)
LDL Cholesterol: 55 mg/dL (ref <130)
Total CHOL/HDL Ratio: 3.1 Ratio (ref <=5.0)
Triglycerides: 107 mg/dL (ref <150)
VLDL: 21 mg/dL (ref <30)

## 2016-02-13 MED ORDER — CARVEDILOL 12.5 MG PO TABS: 12.5000 mg | ORAL_TABLET | Freq: Two times a day (BID) | ORAL | 3 refills | Status: DC

## 2016-02-13 MED ORDER — PRASUGREL HCL 10 MG PO TABS: 10.0000 mg | ORAL_TABLET | Freq: Every day | ORAL | 11 refills | Status: DC

## 2016-02-13 NOTE — Progress Notes (Signed)
CARDIOLOGY OFFICE NOTE  Date:  02/13/2016    [name] Date of Birth: 01/25/1961 Medical Record [id number]  PCP:  [facility]  Cardiologist:  [name]  Chief Complaint  Patient presents with  . Coronary Artery Disease    Seen for Dr. [name]    History of Present Illness: Lonnie Skinner is a 55 y.o. male who presents today for a follow up visit. This is an approximate 6 month check. Seen for Dr. [name].   He has HTN, HLD, stage II-III CKD and hyperglycemia. Followed at the [facility] typically.   Admitted back in early 2015 with CAD/NSTEMI - s/p DES to the mRCA, residual moderate LAD disease (if fails on medical therapy would proceed with PCI). He has typically not followed CV risk factor modification.   Last seen back in April of 2016 and he was felt to be doing ok.   Comes back today. Here with his wife. Says he is here for his yearly check up.  Notes swelling in his feet but does not seem really bothered by it. Not short of breath. Getting way too much salt. Eating chili out of a can, hotdogs, [employer] breakfast bowls, etc. Does not elevate his legs. No recent labs. No smoking. No chest pain. Needs his medicines refilled.   Past Medical History:  Diagnosis Date  . CAD (coronary artery disease)    a. 05/2013: NSTEMI s/p DES to mRCA (culprit vessel), residual moderate LAD disease (the patient fails medical therapy and this was found to be significant, this would be amenable to PCI).  . CKD (chronic kidney disease), stage II   . Hypercholesteremia   . Hyperglycemia   . Hypertension     Past Surgical History:  Procedure Laterality Date  . CORONARY STENT PLACEMENT  05/2013   mRCA  . LEFT HEART CATHETERIZATION WITH CORONARY ANGIOGRAM N/A 06/02/2013   Procedure: LEFT HEART CATHETERIZATION WITH CORONARY ANGIOGRAM;  Surgeon: [name], MD;  Location: [facility] CATH LAB;  Service: Cardiovascular;  Laterality: N/A;  . PERCUTANEOUS CORONARY STENT INTERVENTION  (PCI-S)  06/02/2013   Procedure: PERCUTANEOUS CORONARY STENT INTERVENTION (PCI-S);  Surgeon: [name], MD;  Location: [facility] CATH LAB;  Service: Cardiovascular;;     Medications: Current Outpatient Prescriptions  Medication Sig Dispense Refill  . aspirin EC 81 MG EC tablet Take 1 tablet (81 mg total) by mouth daily.    Marland Kitchen atorvastatin (LIPITOR) 40 MG tablet TAKE 1 TABLET BY MOUTH EVERY EVENING 90 tablet 0  . carvedilol (COREG) 12.5 MG tablet Take 1 tablet (12.5 mg total) by mouth 2 (two) times daily. 180 tablet 3  . HYDROcodone-acetaminophen (NORCO/VICODIN) 5-325 MG tablet Take 1 tablet by mouth every 6 (six) hours as needed for moderate pain. 20 tablet 0  . isosorbide mononitrate (IMDUR) 30 MG 24 hr tablet TAKE 1 TABLET BY MOUTH EVERY DAY 90 tablet 0  . nitroGLYCERIN (NITROSTAT) 0.4 MG SL tablet Place 1 tablet (0.4 mg total) under the tongue every 5 (five) minutes as needed for chest pain (up to 3 doses). 25 tablet 3  . ondansetron (ZOFRAN) 4 MG tablet Take 1 tablet (4 mg total) by mouth every 8 (eight) hours as needed for nausea or vomiting. 20 tablet 0  . prasugrel (EFFIENT) 10 MG TABS tablet Take 1 tablet (10 mg total) by mouth daily. 30 tablet 11   No current facility-administered medications for this visit.     Allergies: No Known Allergies  Social History: The  patient  reports that he has quit smoking. He has never used smokeless tobacco. He reports that he does not drink alcohol or use drugs.   Family History: The patient's family history is not on file.   Review of Systems: Please see the history of present illness.   Otherwise, the review of systems is positive for none.   All other systems are reviewed and negative.   Physical Exam: VS:  BP (!) 160/100   Pulse 70   Wt 231 lb 6.4 oz (105 kg)   BMI 36.24 kg/m  .  BMI Body mass index is 36.24 kg/m.  Wt Readings from Last 3 Encounters:  02/13/16 231 lb 6.4 oz (105 kg)  09/15/14 230 lb (104.3 kg)  11/19/13 218 lb  (98.9 kg)    General: Obese male who is alert and in no acute distress.  HEENT: Normal but with very poor dentition.  Neck: Supple, no JVD, carotid bruits, or masses noted.  Cardiac: Regular rate and rhythm. No murmurs, rubs, or gallops. +1 edema.  Respiratory:  Lungs are clear to auscultation bilaterally with normal work of breathing.  GI: Soft and nontender.  MS: No deformity or atrophy. Gait and ROM intact.  Skin: Warm and dry. Color is normal.  Neuro:  Strength and sensation are intact and no gross focal deficits noted.  Psych: Alert, appropriate and with normal affect.   LABORATORY DATA:  EKG:  EKG is ordered today. This show NSR.   Lab Results  Component Value Date   WBC 8.6 09/15/2014   HGB 15.6 09/15/2014   HCT 45.4 09/15/2014   PLT 232.0 09/15/2014   GLUCOSE 101 (H) 09/15/2014   CHOL 98 09/15/2014   TRIG 120.0 09/15/2014   HDL 33.50 (L) 09/15/2014   LDLCALC 41 09/15/2014   ALT 29 09/15/2014   AST 24 09/15/2014   NA 137 09/15/2014   K 4.2 09/15/2014   CL 102 09/15/2014   CREATININE 1.71 (H) 09/15/2014   BUN 14 09/15/2014   CO2 30 09/15/2014   TSH 2.16 09/15/2014   INR 1.05 06/01/2013   HGBA1C 5.7 06/10/2013    BNP (last 3 results) No results for input(s): BNP in the last 8760 hours.  ProBNP (last 3 results) No results for input(s): PROBNP in the last 8760 hours.   Other Studies Reviewed Today: ANGIOGRAPHIC DATA: The left main coronary artery is absent. There appear to be separate ostia of the LAD and circumflex.  The left anterior descending artery is a large vessel which reaches the apex. In the mid vessel, there is a 50-70% lesion in the mid LAD. There is mild to moderate diffuse disease. There 2 large diagonals which are widely patent. The apical LAD is small but patent.  The left circumflex artery is a medium size vessel. There appears to be some vasospasm proximally. There is a large first marginal which is patent. The second marginal is a larger  vessel which branches across the lateral wall. This appears widely patent.  The right coronary artery is a large dominant vessel. In the mid vessel, there is a focal 99% stenosis with visible thrombus. The posterior lateral artery is very large and widely patent. The posterior descending artery is widely patent.  LEFT VENTRICULOGRAM: Left ventricular angiogram was not done. LVEDP was 13 mmHg.  PCI NARRATIVE: A JR 4 guiding catheters u used to engage the RCA. Angiomax was used for anticoagulation. Initially, a pro-water wire was advanced but would not cross the lesion. A Johnnye Lana  XT density cross the lesion in the mid right coronary artery. A 2.5 x 12 balloon was used to predilate the lesion. A 2.75 x 16 promise drug-eluting stent was then deployed. The stent was post dilated with a 3.25 x 12 noncompliant balloon. There is an excellent angiographic result. There is no residual stenosis. There were several areas of vasospasm during intervention noted in the distal right and posterior lateral artery. Both resolved with intracoronary nitroglycerin.   Echo 06/18/2013 Left ventricle: The cavity size was normal. There was mild focal basal hypertrophy of the septum. Systolic function was normal. The estimated ejection fraction was in the range of 55% to 60%. Wall motion was normal; there were no regional wall motion abnormalities. Features are consistent with a pseudonormal left ventricular filling pattern, with concomitant abnormal relaxation and increased filling pressure (grade 2 diastolic dysfunction).    IMPRESSIONS:  1. Absent left main coronary artery. There are separate ostia of the LAD and circumflex 2. Moderate lesion in the mid left anterior descending artery. If the patient fails medical therapy and this was found to be significant, this would be amenable to percutaneous revascularization. 3. Mild disease in the left circumflex artery and its branches. 4. Large, dominant right coronary  artery with a 99% mid vessel stenosis which is responsible for his presentation today. This was successfully treated with a 2.75 x 16 promus drug-eluting stent, postdilated to 3.3 mm in diameter. 5. LVEDP 13 mmHg.  RECOMMENDATION: Continue dual antiplatelet therapy for at least a year. I stressed the importance of preventive therapy and is taking his antiplatelet therapy to prevent stent thrombosis. Continue aggressive medical therapy and antianginal therapy given the moderate disease in his LAD. If he has refractory symptoms, further ischemia evaluation is warranted. LV gram not performed to save contrast. Consider echocardiogram.    Assessment/Plan: 1. CAD/NSTEMI with PCI with DES to the mid RCA with residual moderate LAD disease from 05/2013 - asymptomatic - recommended CV risk factor modification - does not really seem motivated to make changes.   2. Diastolic dysfunction - has preserved LVEF, but stage 2 diastolic dysfunction and elevated filling pressures. Needs salt restriction and good BP control.   3. HTN - BP not controlled - increasing the Coreg to 12.5 mg BID  4. CKD - rechecking lab today  5. HLD - on statin - rechecking lab today.  Current medicines are reviewed with the patient today.  The patient does not have concerns regarding medicines other than what has been noted above.  The following changes have been made:  See above.  Labs/ tests ordered today include:    Orders Placed This Encounter  Procedures  . Basic metabolic panel  . CBC  . Hepatic function panel  . Lipid panel  . EKG 12-Lead     Disposition:   FU with Dr. [name] in about 3 months.   Patient is agreeable to this plan and will call if any problems develop in the interim.   Signed: Burtis Junes, RN, ANP-C 02/13/2016 3:32 PM  [facility] [address] [place], [place]  [address] Phone: [phone] Fax: [phone]

## 2016-02-13 NOTE — Patient Instructions (Addendum)
.  We will be checking the following labs today - BMET, CBC, HPF and lipids   Medication Instructions:    Continue with your current medicines. BUT  I have increased the Coreg to 12.5 mg twice a day - this is at the drug store  I have refilled the Effient today.     Testing/Procedures To Be Arranged:  N/A  Follow-Up:   See Dr. [name] in 3 months    Other Special Instructions:   Try to eat a little less and move a little more.     If you need a refill on your cardiac medications before your next appointment, please call your pharmacy.   Call the [place] office at [phone] if you have any questions, problems or concerns.

## 2016-02-14 ENCOUNTER — Other Ambulatory Visit: Payer: Self-pay | Admitting: Cardiology

## 2016-05-02 ENCOUNTER — Encounter: Payer: Self-pay | Admitting: Cardiology

## 2016-05-10 ENCOUNTER — Ambulatory Visit (INDEPENDENT_AMBULATORY_CARE_PROVIDER_SITE_OTHER): Payer: Medicare Other | Admitting: Cardiology

## 2016-05-10 ENCOUNTER — Encounter: Payer: Self-pay | Admitting: Cardiology

## 2016-05-10 VITALS — BP 128/74 | HR 78 | Ht 67.0 in | Wt 225.0 lb

## 2016-05-10 DIAGNOSIS — I2510 Atherosclerotic heart disease of native coronary artery without angina pectoris: Secondary | ICD-10-CM

## 2016-05-10 DIAGNOSIS — I10 Essential (primary) hypertension: Secondary | ICD-10-CM

## 2016-05-10 DIAGNOSIS — I5033 Acute on chronic diastolic (congestive) heart failure: Secondary | ICD-10-CM | POA: Diagnosis not present

## 2016-05-10 DIAGNOSIS — E782 Mixed hyperlipidemia: Secondary | ICD-10-CM | POA: Diagnosis not present

## 2016-05-10 LAB — CBC WITH DIFFERENTIAL/PLATELET
Basophils Absolute: 0 cells/uL (ref 0–200)
Basophils Relative: 0 %
Eosinophils Absolute: 465 cells/uL (ref 15–500)
Eosinophils Relative: 5 %
HCT: 44.7 % (ref 38.5–50.0)
Hemoglobin: 15.4 g/dL (ref 13.2–17.1)
Lymphocytes Relative: 28 %
Lymphs Abs: 2604 cells/uL (ref 850–3900)
MCH: 30.8 pg (ref 27.0–33.0)
MCHC: 34.5 g/dL (ref 32.0–36.0)
MCV: 89.4 fL (ref 80.0–100.0)
MPV: 10.1 fL (ref 7.5–12.5)
Monocytes Absolute: 744 cells/uL (ref 200–950)
Monocytes Relative: 8 %
Neutro Abs: 5487 cells/uL (ref 1500–7800)
Neutrophils Relative %: 59 %
Platelets: 250 10*3/uL (ref 140–400)
RBC: 5 MIL/uL (ref 4.20–5.80)
RDW: 13.8 % (ref 11.0–15.0)
WBC: 9.3 10*3/uL (ref 3.8–10.8)

## 2016-05-10 LAB — COMPREHENSIVE METABOLIC PANEL
ALT: 40 U/L (ref 9–46)
AST: 28 U/L (ref 10–35)
Albumin: 3.9 g/dL (ref 3.6–5.1)
Alkaline Phosphatase: 106 U/L (ref 40–115)
BUN: 15 mg/dL (ref 7–25)
CO2: 30 mmol/L (ref 20–31)
Calcium: 9.2 mg/dL (ref 8.6–10.3)
Chloride: 103 mmol/L (ref 98–110)
Creat: 1.34 mg/dL — ABNORMAL HIGH (ref 0.70–1.33)
Glucose, Bld: 103 mg/dL — ABNORMAL HIGH (ref 65–99)
Potassium: 4.6 mmol/L (ref 3.5–5.3)
Sodium: 138 mmol/L (ref 135–146)
Total Bilirubin: 0.4 mg/dL (ref 0.2–1.2)
Total Protein: 6.3 g/dL (ref 6.1–8.1)

## 2016-05-10 MED ORDER — FUROSEMIDE 40 MG PO TABS: 40.0000 mg | ORAL_TABLET | Freq: Every day | ORAL | 3 refills | Status: DC

## 2016-05-10 NOTE — Patient Instructions (Signed)
Medication Instructions:   START TAKING LASIX 40 MG ONCE DAILY   Labwork:  TODAY--CMET, CBC W DIFF, AND BNP    Follow-Up:  2 WEEKS WITH AN EXTENDER     If you need a refill on your cardiac medications before your next appointment, please call your pharmacy.

## 2016-05-10 NOTE — Progress Notes (Signed)
CARDIOLOGY OFFICE NOTE  Date:  05/14/2016    [name] Date of Birth: [birth year] Medical Record #[id number]  PCP:  [facility]  Cardiologist:  [name]  Chief complain: DOE  History of Present Illness: Lonnie Skinner is a 55 y.o. male who presents today for a follow up visit. This is an approximate 6 month check. Seen for Dr. [name].   He has HTN, HLD, stage II-III CKD and hyperglycemia. Followed at the [facility].   Admitted back in early 2015 with CAD/NSTEMI - s/p DES to the mRCA, residual moderate LAD disease (if fails on medical therapy would proceed with PCI). He has typically not followed CV risk factor modification.   05/10/2016 - 3 months follow up, the patient states that he has noticed worsening DOE, and LE edema, no chest pain.  He has noticed PND for the last week. No palpitations or syncope.    Past Medical History:  Diagnosis Date  . CAD (coronary artery disease)    a. 05/2013: NSTEMI s/p DES to mRCA (culprit vessel), residual moderate LAD disease (the patient fails medical therapy and this was found to be significant, this would be amenable to PCI).  . CKD (chronic kidney disease), stage II   . Hypercholesteremia   . Hyperglycemia   . Hypertension     Past Surgical History:  Procedure Laterality Date  . CORONARY STENT PLACEMENT  05/2013   mRCA  . LEFT HEART CATHETERIZATION WITH CORONARY ANGIOGRAM N/A 06/02/2013   Procedure: LEFT HEART CATHETERIZATION WITH CORONARY ANGIOGRAM;  Surgeon: [name], MD;  Location: [facility] CATH LAB;  Service: Cardiovascular;  Laterality: N/A;  . PERCUTANEOUS CORONARY STENT INTERVENTION (PCI-S)  06/02/2013   Procedure: PERCUTANEOUS CORONARY STENT INTERVENTION (PCI-S);  Surgeon: [name], MD;  Location: [facility] CATH LAB;  Service: Cardiovascular;;     Medications: Current Outpatient Prescriptions  Medication Sig Dispense Refill  . ALPRAZolam (XANAX) 0.25 MG tablet Take 0.25 mg by mouth at bedtime  as needed for anxiety.    Marland Kitchen amoxicillin-clavulanate (AUGMENTIN) 500-125 MG tablet Take 1 tablet by mouth 2 (two) times daily.    Marland Kitchen aspirin EC 81 MG EC tablet Take 1 tablet (81 mg total) by mouth daily.    Marland Kitchen atorvastatin (LIPITOR) 40 MG tablet TAKE 1 TABLET BY MOUTH EVERY EVENING 90 tablet 3  . carvedilol (COREG) 12.5 MG tablet Take 1 tablet (12.5 mg total) by mouth 2 (two) times daily. 180 tablet 3  . isosorbide mononitrate (IMDUR) 30 MG 24 hr tablet TAKE 1 TABLET BY MOUTH EVERY DAY 90 tablet 0  . nitroGLYCERIN (NITROSTAT) 0.4 MG SL tablet Place 1 tablet (0.4 mg total) under the tongue every 5 (five) minutes as needed for chest pain (up to 3 doses). 25 tablet 3  . Omega-3 Fatty Acids (OMEGA-3 FISH OIL PO) Take by mouth as needed.    . prasugrel (EFFIENT) 10 MG TABS tablet Take 1 tablet (10 mg total) by mouth daily. 30 tablet 11  . furosemide (LASIX) 40 MG tablet Take 1 tablet (40 mg total) by mouth daily. 90 tablet 3  . HYDROcodone-acetaminophen (NORCO/VICODIN) 5-325 MG tablet Take 1 tablet by mouth every 6 (six) hours as needed for moderate pain. (Patient not taking: Reported on 05/10/2016) 20 tablet 0  . ondansetron (ZOFRAN) 4 MG tablet Take 1 tablet (4 mg total) by mouth every 8 (eight) hours as needed for nausea or vomiting. (Patient not taking: Reported on 05/10/2016) 20 tablet 0   No  current facility-administered medications for this visit.     Allergies: No Known Allergies  Social History: The patient  reports that he has quit smoking. He has never used smokeless tobacco. He reports that he does not drink alcohol or use drugs.   Family History: The patient's family history is not on file.   Review of Systems: Please see the history of present illness.   Otherwise, the review of systems is positive for none.   All other systems are reviewed and negative.   Physical Exam: VS:  BP 128/74   Pulse 78   Ht '5\' 7"'  (1.702 m)   Wt 225 lb (102.1 kg)   SpO2 97%   BMI 35.24 kg/m  .  BMI  Body mass index is 35.24 kg/m.  Wt Readings from Last 3 Encounters:  05/10/16 225 lb (102.1 kg)  02/13/16 231 lb 6.4 oz (105 kg)  09/15/14 230 lb (104.3 kg)    General: Obese male who is alert and in no acute distress.  HEENT: Normal but with very poor dentition.  Neck: Supple, no JVD, carotid bruits, or masses noted.  Cardiac: Regular rate and rhythm. No murmurs, rubs, or gallops. +1 edema.  Respiratory:  Lungs are clear to auscultation bilaterally with normal work of breathing.  GI: Soft and nontender.  MS: No deformity or atrophy. Gait and ROM intact.  Skin: Warm and dry. Color is normal.  Neuro:  Strength and sensation are intact and no gross focal deficits noted.  Psych: Alert, appropriate and with normal affect.   LABORATORY DATA:  EKG:  EKG is ordered today. This show NSR.   Lab Results  Component Value Date   WBC 9.3 05/10/2016   HGB 15.4 05/10/2016   HCT 44.7 05/10/2016   PLT 250 05/10/2016   GLUCOSE 103 (H) 05/10/2016   CHOL 113 (L) 02/13/2016   TRIG 107 02/13/2016   HDL 37 (L) 02/13/2016   LDLCALC 55 02/13/2016   ALT 40 05/10/2016   AST 28 05/10/2016   NA 138 05/10/2016   K 4.6 05/10/2016   CL 103 05/10/2016   CREATININE 1.34 (H) 05/10/2016   BUN 15 05/10/2016   CO2 30 05/10/2016   TSH 2.16 09/15/2014   INR 1.05 06/01/2013   HGBA1C 5.7 06/10/2013    BNP (last 3 results)  Recent Labs  05/10/16 1121  BNP 10.0    ProBNP (last 3 results) No results for input(s): PROBNP in the last 8760 hours.   Other Studies Reviewed Today: ANGIOGRAPHIC DATA: The left main coronary artery is absent. There appear to be separate ostia of the LAD and circumflex.  The left anterior descending artery is a large vessel which reaches the apex. In the mid vessel, there is a 50-70% lesion in the mid LAD. There is mild to moderate diffuse disease. There 2 large diagonals which are widely patent. The apical LAD is small but patent.  The left circumflex artery is a medium  size vessel. There appears to be some vasospasm proximally. There is a large first marginal which is patent. The second marginal is a larger vessel which branches across the lateral wall. This appears widely patent.  The right coronary artery is a large dominant vessel. In the mid vessel, there is a focal 99% stenosis with visible thrombus. The posterior lateral artery is very large and widely patent. The posterior descending artery is widely patent.  LEFT VENTRICULOGRAM: Left ventricular angiogram was not done. LVEDP was 13 mmHg.  PCI NARRATIVE: A JR  4 guiding catheters u used to engage the RCA. Angiomax was used for anticoagulation. Initially, a pro-water wire was advanced but would not cross the lesion. A Fielder XT density cross the lesion in the mid right coronary artery. A 2.5 x 12 balloon was used to predilate the lesion. A 2.75 x 16 promise drug-eluting stent was then deployed. The stent was post dilated with a 3.25 x 12 noncompliant balloon. There is an excellent angiographic result. There is no residual stenosis. There were several areas of vasospasm during intervention noted in the distal right and posterior lateral artery. Both resolved with intracoronary nitroglycerin.   Echo 06/18/2013 Left ventricle: The cavity size was normal. There was mild focal basal hypertrophy of the septum. Systolic function was normal. The estimated ejection fraction was in the range of 55% to 60%. Wall motion was normal; there were no regional wall motion abnormalities. Features are consistent with a pseudonormal left ventricular filling pattern, with concomitant abnormal relaxation and increased filling pressure (grade 2 diastolic dysfunction).    IMPRESSIONS:  1. Absent left main coronary artery. There are separate ostia of the LAD and circumflex 2. Moderate lesion in the mid left anterior descending artery. If the patient fails medical therapy and this was found to be significant, this would be amenable  to percutaneous revascularization. 3. Mild disease in the left circumflex artery and its branches. 4. Large, dominant right coronary artery with a 99% mid vessel stenosis which is responsible for his presentation today. This was successfully treated with a 2.75 x 16 promus drug-eluting stent, postdilated to 3.3 mm in diameter. 5. LVEDP 13 mmHg.  RECOMMENDATION: Continue dual antiplatelet therapy for at least a year. I stressed the importance of preventive therapy and is taking his antiplatelet therapy to prevent stent thrombosis. Continue aggressive medical therapy and antianginal therapy given the moderate disease in his LAD. If he has refractory symptoms, further ischemia evaluation is warranted. LV gram not performed to save contrast. Consider echocardiogram.    Assessment/Plan:  1. DOE - acute on chronic diastolic dysfunction  - start Lasix 40 mg po Daily - check BNP, CMP, CBC today - follow up in 2 weeks  2. CAD/NSTEMI with PCI with DES to the mid RCA with residual moderate LAD disease from 05/2013 - asymptomatic - recommended CV risk factor modification - does not really seem motivated to make changes.   3. HTN - BP controlled   4. CKD - rechecking lab today  5. HLD - on statin - rechecking lab today.  Current medicines are reviewed with the patient today.  The patient does not have concerns regarding medicines other than what has been noted above.  The following changes have been made:  See above.  Labs/ tests ordered today include:    Orders Placed This Encounter  Procedures  . Comp Met (CMET)  . CBC w/Diff  . B Nat Peptide   Patient is agreeable to this plan and will call if any problems develop in the interim.   Signed: Ena Dawley, MD 05/14/2016 3:10 PM  [facility] [address] [place], [place]  [address] Phone: [phone] Fax: [phone]

## 2016-05-11 LAB — BRAIN NATRIURETIC PEPTIDE: Brain Natriuretic Peptide: 10 pg/mL (ref <100)

## 2016-05-22 ENCOUNTER — Telehealth: Payer: Self-pay | Admitting: Cardiology

## 2016-05-22 NOTE — Telephone Encounter (Signed)
Pt calling to ask should he see Cecilie Kicks NP, as scheduled for this Thursday 05/24/16 at 1030, as ordered by Dr [name] at his previous OV with her.  Pt states that all his labs Dr [name] did at that last OV came back normal.  Informed the pt that he should keep his scheduled appt with Mickel Baas on 05/24/16 at 45, for Dr [name] wanted him to follow with her, for recent med changes she made at his last OV.  Pt verbalized understanding and agrees with this plan.  Pt states he will keep his appt scheduled with Mickel Baas for 05/24/16 at 1030. Pt gracious for all the assistance provided.

## 2016-05-22 NOTE — Telephone Encounter (Signed)
New message      Pt states that his labs were normal and he want to know if he needs to keep his follow up appt.  He was scheduled with the PA. (he wanted to see the doctor)

## 2016-05-23 NOTE — Progress Notes (Signed)
Cardiology Office Note   Date:  05/24/2016   ID:  Lonnie Skinner, DOB 1960-06-10, MRN [id number]  PCP:  [facility]  Cardiologist:  Dr. [name]    Chief Complaint  Patient presents with  . Edema    pt states in feet when he dangles his feet       History of Present Illness: Lonnie Skinner is a 56 y.o. male who presents for follow up for edema  He has HTN, HLD, stage II-III CKD and hyperglycemia. Followed at the [facility] typically.   Admitted back in early 2015 with CAD/NSTEMI - s/p DES to the mRCA, residual moderate LAD disease (if fails on medical therapy would proceed with PCI). He has typically not followed CV risk factor modification.   05/10/2016 - 3 months follow up, the patient stated that he has noticed worsening DOE, and LE edema, no chest pain.  He has noticed PND for the last week. No palpitations or syncope. Saw Dr. [name] on the 21 st of Dec.  Lasix 40 mg daily added,   Today he denies chest pain or SOB, he stated he has not had SOB.  His edema is improved- he has on heavy clothes today and wt is only down 1 lb.   He is followed by DR, Redmond Pulling at [facility] for leg wound on antibiotic and Dr. Romero Liner for IM.   His rt lower leg is slowly improving.   Now on augmentin 875/125.         Past Medical History:  Diagnosis Date  . CAD (coronary artery disease)    a. 05/2013: NSTEMI s/p DES to mRCA (culprit vessel), residual moderate LAD disease (the patient fails medical therapy and this was found to be significant, this would be amenable to PCI).  . CKD (chronic kidney disease), stage II   . Hypercholesteremia   . Hyperglycemia   . Hypertension     Past Surgical History:  Procedure Laterality Date  . CORONARY STENT PLACEMENT  05/2013   mRCA  . LEFT HEART CATHETERIZATION WITH CORONARY ANGIOGRAM N/A 06/02/2013   Procedure: LEFT HEART CATHETERIZATION WITH CORONARY ANGIOGRAM;  Surgeon: [name], MD;  Location: [facility] CATH LAB;  Service: Cardiovascular;   Laterality: N/A;  . PERCUTANEOUS CORONARY STENT INTERVENTION (PCI-S)  06/02/2013   Procedure: PERCUTANEOUS CORONARY STENT INTERVENTION (PCI-S);  Surgeon: [name], MD;  Location: [facility] CATH LAB;  Service: Cardiovascular;;     Current Outpatient Prescriptions  Medication Sig Dispense Refill  . ALPRAZolam (XANAX) 0.25 MG tablet Take 0.25 mg by mouth at bedtime as needed for anxiety.    Marland Kitchen amoxicillin-clavulanate (AUGMENTIN) 500-125 MG tablet Take 1 tablet by mouth 2 (two) times daily.    Marland Kitchen aspirin EC 81 MG EC tablet Take 1 tablet (81 mg total) by mouth daily.    Marland Kitchen atorvastatin (LIPITOR) 40 MG tablet TAKE 1 TABLET BY MOUTH EVERY EVENING 90 tablet 3  . furosemide (LASIX) 40 MG tablet Take 1 tablet (40 mg total) by mouth daily. 90 tablet 3  . HYDROcodone-acetaminophen (NORCO/VICODIN) 5-325 MG tablet Take 1 tablet by mouth every 6 (six) hours as needed for moderate pain. 20 tablet 0  . isosorbide mononitrate (IMDUR) 30 MG 24 hr tablet TAKE 1 TABLET BY MOUTH EVERY DAY 90 tablet 0  . nitroGLYCERIN (NITROSTAT) 0.4 MG SL tablet Place 1 tablet (0.4 mg total) under the tongue every 5 (five) minutes as needed for chest pain (up to 3 doses). 25 tablet 3  .  Omega-3 Fatty Acids (OMEGA-3 FISH OIL PO) Take by mouth as needed.    . ondansetron (ZOFRAN) 4 MG tablet Take 1 tablet (4 mg total) by mouth every 8 (eight) hours as needed for nausea or vomiting. 20 tablet 0  . prasugrel (EFFIENT) 10 MG TABS tablet Take 1 tablet (10 mg total) by mouth daily. 30 tablet 11  . carvedilol (COREG) 12.5 MG tablet Take 1 tablet (12.5 mg total) by mouth 2 (two) times daily. 180 tablet 3   No current facility-administered medications for this visit.     Allergies:   Patient has no known allergies.    Social History:  The patient  reports that he has quit smoking. He has never used smokeless tobacco. He reports that he does not drink alcohol or use drugs.   Family History:  The patient's family history includes Cancer  in his father; Heart attack in his brother and mother; Heart disease in his brother and mother; Heart failure in his mother; Stroke in his mother.    ROS:  General:no colds or fevers, some weight changes Skin:no rashes + RLL ulcer HEENT:no blurred vision, no congestion CV:see HPI PUL:see HPI GI:no diarrhea constipation or melena, no indigestion GU:no hematuria, no dysuria MS:no joint pain, no claudication Neuro:no syncope, no lightheadedness Endo:no diabetes, no thyroid disease  Wt Readings from Last 3 Encounters:  05/24/16 224 lb 12.8 oz (102 kg)  05/10/16 225 lb (102.1 kg)  02/13/16 231 lb 6.4 oz (105 kg)     PHYSICAL EXAM: VS:  BP 124/72   Pulse 74   Ht 5\' 7"  (1.702 m)   Wt 224 lb 12.8 oz (102 kg)   SpO2 98%   BMI 35.21 kg/m  , BMI Body mass index is 35.21 kg/m. General:Pleasant affect, NAD Skin:Warm and dry, brisk capillary refill HEENT:normocephalic, sclera clear, mucus membranes moist Neck:supple, no JVD, no bruits  Heart:S1S2 RRR without murmur, gallup, rub or click Lungs:clear without rales, rhonchi, or wheezes [id number], non tender, + BS, do not palpate liver spleen or masses Ext:1+ to trace lower ext edema,  2+ radial pulses Neuro:alert and oriented X 3, MAE, follows commands, + facial symmetry    EKG:  EKG is NOT ordered today.    Recent Labs: 05/10/2016: ALT 40; Brain Natriuretic Peptide 10.0; BUN 15; Creat 1.34; Hemoglobin 15.4; Platelets 250; Potassium 4.6; Sodium 138    Lipid Panel    Component Value Date/Time   CHOL 113 (L) 02/13/2016 1540   TRIG 107 02/13/2016 1540   HDL 37 (L) 02/13/2016 1540   CHOLHDL 3.1 02/13/2016 1540   VLDL 21 02/13/2016 1540   LDLCALC 55 02/13/2016 1540       Other studies Reviewed: Additional studies/ records that were reviewed today include:  06/18/13  ECHO. Study Conclusions  Left ventricle: The cavity size was normal. There was mild focal basal hypertrophy of the septum. Systolic function was normal. The  estimated ejection fraction was in the range of 55% to 60%. Wall motion was normal; there were no regional wall motion abnormalities. Features are consistent with a pseudonormal left ventricular filling pattern, with concomitant abnormal relaxation and increased filling pressure (grade 2 diastolic dysfunction).  Impressions:  - No prior study for comparison.  ASSESSMENT AND PLAN:   1. Edema  - continue Lasix 40 mg po Daily - follow up in 3 months -recheck BMP to check Cr and K+    2. CAD/NSTEMI with PCI with DES to the mid RCA with residual moderate LAD disease  from 05/2013 - asymptomatic - recommended CV risk factor modification - does not really seem motivated to make changes.  We reviewed diet again today and need to decrease salt  3. HTN - BP controlled   4. CKD - rechecking lab today  5. HLD - on statin - check in Sept was good with LDL at 55  Current medicines are reviewed with the patient today.  The patient Has no concerns regarding medicines.  The following changes have been made:  See above Labs/ tests ordered today include:see above  Disposition:   FU:  see above  Signed, Cecilie Kicks, NP  05/24/2016 11:03 AM    [address], [place], [place], [place] Phone: [phone]; Fax: [phone]

## 2016-05-24 ENCOUNTER — Ambulatory Visit: Payer: Medicare Other | Admitting: Cardiology

## 2016-05-24 ENCOUNTER — Ambulatory Visit (INDEPENDENT_AMBULATORY_CARE_PROVIDER_SITE_OTHER): Payer: Medicare Other | Admitting: Cardiology

## 2016-05-24 ENCOUNTER — Encounter: Payer: Self-pay | Admitting: Cardiology

## 2016-05-24 VITALS — BP 124/72 | HR 74 | Ht 67.0 in | Wt 224.8 lb

## 2016-05-24 DIAGNOSIS — I5033 Acute on chronic diastolic (congestive) heart failure: Secondary | ICD-10-CM

## 2016-05-24 DIAGNOSIS — Z79899 Other long term (current) drug therapy: Secondary | ICD-10-CM | POA: Diagnosis not present

## 2016-05-24 DIAGNOSIS — R600 Localized edema: Secondary | ICD-10-CM

## 2016-05-24 DIAGNOSIS — I25119 Atherosclerotic heart disease of native coronary artery with unspecified angina pectoris: Secondary | ICD-10-CM | POA: Diagnosis not present

## 2016-05-24 DIAGNOSIS — E782 Mixed hyperlipidemia: Secondary | ICD-10-CM

## 2016-05-24 DIAGNOSIS — I2510 Atherosclerotic heart disease of native coronary artery without angina pectoris: Secondary | ICD-10-CM

## 2016-05-24 NOTE — Patient Instructions (Addendum)
Medication Instructions:    Your physician recommends that you continue on your current medications as directed. Please refer to the Current Medication list given to you today.  --- If you need a refill on your cardiac medications before your next appointment, please call your pharmacy. ---  Labwork:  BMET today  Testing/Procedures:  None ordered  Follow-Up:  Your physician recommends that you schedule a follow-up appointment in: 3 months with Dr. [name].  Thank you for choosing [facility]!!

## 2016-05-25 LAB — BASIC METABOLIC PANEL
BUN / CREAT RATIO: 13 (ref 9–20)
BUN: 17 mg/dL (ref 6–24)
CO2: 26 mmol/L (ref 18–29)
CREATININE: 1.3 mg/dL — AB (ref 0.76–1.27)
Calcium: 9.6 mg/dL (ref 8.7–10.2)
Chloride: 99 mmol/L (ref 96–106)
GFR calc Af Amer: 71 mL/min/{1.73_m2} (ref >59)
GFR calc non Af Amer: 61 mL/min/{1.73_m2} (ref >59)
GLUCOSE: 94 mg/dL (ref 65–99)
POTASSIUM: 4.8 mmol/L (ref 3.5–5.2)
SODIUM: 141 mmol/L (ref 134–144)

## 2016-07-15 ENCOUNTER — Other Ambulatory Visit: Payer: Self-pay | Admitting: Cardiology

## 2016-07-19 ENCOUNTER — Telehealth: Payer: Self-pay | Admitting: Cardiology

## 2016-07-19 NOTE — Telephone Encounter (Signed)
Pt states he wanted to let Dr [name] know that he will soon be following up with a wound care specialist at [facility], for bilateral leg infections, and ulcers.  Pt states that he is undergoing wound care now, but will be going to a specialist in the near future.  Informed the pt that I will pass this information along to Dr [name], and we are appreciative for him sharing this with [place].  Pt verbalized understanding.

## 2016-07-19 NOTE — Telephone Encounter (Signed)
New message  Pt voiced someone called but there are no encounters for this pts conversation.  Please f/u

## 2016-10-08 ENCOUNTER — Ambulatory Visit: Payer: Medicare Other | Admitting: Physician Assistant

## 2017-01-04 ENCOUNTER — Ambulatory Visit (INDEPENDENT_AMBULATORY_CARE_PROVIDER_SITE_OTHER): Payer: Medicare Other | Admitting: Family Medicine

## 2017-01-04 ENCOUNTER — Encounter: Payer: Self-pay | Admitting: Family Medicine

## 2017-01-04 VITALS — BP 125/85 | HR 66 | Temp 97.7°F | Ht 67.0 in | Wt 216.0 lb

## 2017-01-04 DIAGNOSIS — I10 Essential (primary) hypertension: Secondary | ICD-10-CM | POA: Diagnosis not present

## 2017-01-04 DIAGNOSIS — F411 Generalized anxiety disorder: Secondary | ICD-10-CM | POA: Diagnosis not present

## 2017-01-04 DIAGNOSIS — I25119 Atherosclerotic heart disease of native coronary artery with unspecified angina pectoris: Secondary | ICD-10-CM

## 2017-01-04 DIAGNOSIS — Z8619 Personal history of other infectious and parasitic diseases: Secondary | ICD-10-CM

## 2017-01-04 DIAGNOSIS — Z0000 Encounter for general adult medical examination without abnormal findings: Secondary | ICD-10-CM | POA: Diagnosis not present

## 2017-01-04 DIAGNOSIS — E782 Mixed hyperlipidemia: Secondary | ICD-10-CM | POA: Diagnosis not present

## 2017-01-04 DIAGNOSIS — I872 Venous insufficiency (chronic) (peripheral): Secondary | ICD-10-CM | POA: Diagnosis not present

## 2017-01-04 MED ORDER — TRIAMCINOLONE ACETONIDE 0.1 % EX CREA: 1.0000 "application " | TOPICAL_CREAM | Freq: Two times a day (BID) | CUTANEOUS | 0 refills | Status: DC

## 2017-01-04 NOTE — Assessment & Plan Note (Signed)
Obtained CBC, CMP, HbA1c, and Hep C antibody since he is a new patient and we have no recent labs. Will follow up in 3 months unless labs are abnormal.

## 2017-01-04 NOTE — Patient Instructions (Addendum)
It was great to meet you today! Thank you for letting me participate in your care!  Today, we discussed your lower leg swelling and redness. It is most likely due to the veins in your legs not working as well as they should to move blood out of your legs. Please use the compression stockings you have at home and wear them everyday. You do not need to wear them at night. Elevate your legs at night and when you rest at home.  I prescribed a topical corticosteroid to help with the itching call Desonate. Please use it as needed.  Please return if your leg swelling worsens or you notice new and concerning symptoms such as shortness of breath, difficulty breathing, chest pain, fever, chills, or loss of consciousness.  Be well, Harolyn Rutherford, DO PGY-1, [facility]

## 2017-01-04 NOTE — Assessment & Plan Note (Signed)
" >>  ASSESSMENT AND PLAN FOR ESSENTIAL HYPERTENSION, BENIGN WRITTEN ON 01/04/2017  5:02 PM BY Makeda Peeks, DO  BP well controlled at today's visit. Patient had medication with him to verify what he was taking.   Continue Carvedilol  12.5mg . "

## 2017-01-04 NOTE — Assessment & Plan Note (Signed)
BP well controlled at today's visit. Patient had medication with him to verify what he was taking.   Continue Carvedilol 12.5mg .

## 2017-01-04 NOTE — Assessment & Plan Note (Addendum)
Patient has compression stockings at home but has never used them. Educated the patient on the importance of using the compression stockings to prevent swelling of his legs. Educated patient that reducing the swelling will reduce venous stasis that cause ulcers. Educated patient on elevating his feet at night and when he sits down.  Prescribed Kenalog to help with his right leg itching that he says was not present today but does occur sometimes when his leg swells.  Patient expressed agreement and understanding of the plan.

## 2017-01-04 NOTE — Assessment & Plan Note (Addendum)
Patient is being followed by Cardiology regularly. Counseled patient to continue seeing Cardiology as recommended by them.  Continue Isosorbide mononitate 30mg  as needed for angina prophylaxis. Continue Aspirin 81mg  Continue Effient 10mg 

## 2017-01-04 NOTE — Progress Notes (Signed)
Subjective: Chief Complaint  Patient presents with  . Establish Care     HPI: Lonnie Skinner is a 57 y.o. presenting to clinic today to discuss the following:  1 Establish care 2 Leg swelling  Lonnie Skinner has a PMH of Diastolic CHF grade II, CAD, MI s/p stent x 2, HTN, Hyperlipidemia, CKD stage II, Osteoarthitis of the left knee, and Anxiety.  Today he states he is having leg swelling in both his legs and his right leg sometimes itches and is discolored. He has no other complaints, has no pain, and states they have been swollen for the past 8 months. He was previously going to the [place] but wanted a to go to a clinic where he could see the same doctor every time. He was a patient here more than 4 years ago.  He denies any pain, fevers, chills, recent sick contacts, chest pain, SOB, nausea, vomiting, abdominal pain, diarrhea, or constipation.   Health Maintenance: none     ROS noted in HPI.   Past Medical, Surgical, Social, and Family History Reviewed & Updated per EMR.   Pertinent Historical Findings include:   History  Smoking Status  . Former Smoker  Smokeless Tobacco  . Never Used    Comment: Quit ~2007      Objective: BP 125/85 (BP Location: Left Arm, Patient Position: Sitting, Cuff Size: Normal)   Pulse 66   Temp 97.7 F (36.5 C) (Oral)   Ht 5\' 7"  (1.702 m)   Wt 216 lb (98 kg)   SpO2 98%   BMI 33.83 kg/m  Vitals and nursing notes reviewed  Physical Exam  Gen: Alert and Oriented x 3, NAD HEENT: Normocephalic, atraumatic, PERRLA, EOMI, TM visible with good light reflex, non-swollen, non-erythematous turbinates, normal pharyngeal mucosa Neck: trachea midline, no thyroidmegaly, no LAD Resp: CTAB, no wheezing, rales, or rhonchi, comfortable work of breathing CV: RRR, no murmurs, normal S1, S2 split, +2 pulses dorsalis pedis bilaterally Abd: non-distended, non-tender, soft, +bs in all four quadrants, no hepatosplenomegaly MSK: FROM in all four  extremities Ext: no clubbing, cyanosis, +2 lower extremity edema Skin: right lower leg is erythematous from mid tibia to the ankle with a few healed skin lesions that have scabbed over. Bilateral dry and scaly skin on the feet. Otherwise, warm, dry, intact, no rashes.   No results found for this or any previous visit (from the past 72 hour(s)).  Assessment/Plan:  Essential hypertension, benign BP well controlled at today's visit. Patient had medication with him to verify what he was taking.   Continue Carvedilol 12.5mg .  CAD (coronary artery disease) Patient is being followed by Cardiology regularly. Counseled patient to continue seeing Cardiology as recommended by them.  Continue Isosorbide mononitate 30mg  as needed for angina prophylaxis. Continue Aspirin 81mg  Continue Effient 10mg   Hyperlipidemia Patient last Lipid panel was on 02/15/2016. Will repeat his lipid panel at next visit to ensure his cholesterol is well controlled. Patient had medication with him to verify what he was taking.   Continue Atorvastatin 40mg .  Generalized anxiety disorder Patient takes Alprazolam 25mg  BID. Will counsel patient more on addiction potential of benzodiazepines at next visit.  Hx of tinea cruris Patient reports history of tinea cruris that he successfully treated with OTC topical cream. He is not having an outbreak today. Informed patient he could make an appointment if it occurs again and we can prescribe him a medication to treat it.  Encounter for medical examination to establish care Obtained  CBC, CMP, HbA1c, and Hep C antibody since he is a new patient and we have no recent labs. Will follow up in 3 months unless labs are abnormal.  Chronic venous insufficiency Patient has compression stockings at home but has never used them. Educated the patient on the importance of using the compression stockings to prevent swelling of his legs. Educated patient that reducing the swelling will reduce  venous stasis that cause ulcers. Educated patient on elevating his feet at night and when he sits down.  Prescribed Kenalog to help with his right leg itching that he says was not present today but does occur sometimes when his leg swells.  Patient expressed agreement and understanding of the plan.    Please see problem based Assessment and Plan PATIENT EDUCATION PROVIDED: See AVS    Diagnosis and plan along with any newly prescribed medication(s) were discussed in detail with this patient today. The patient verbalized understanding and agreed with the plan. Patient advised if symptoms worsen return to clinic or ER.   Health Maintainance:   Orders Placed This Encounter  Procedures  . CBC with Differential  . CMP and Liver  . Hemoglobin A1C  . Hepatitis C Antibody    Meds ordered this encounter  Medications  . triamcinolone cream (KENALOG) 0.1 %    Sig: Apply 1 application topically 2 (two) times daily.    Dispense:  30 g    Refill:  0     Lonnie Rutherford, DO 01/04/2017, 4:55 PM PGY-1, [place]

## 2017-01-04 NOTE — Assessment & Plan Note (Signed)
Patient reports history of tinea cruris that he successfully treated with OTC topical cream. He is not having an outbreak today. Informed patient he could make an appointment if it occurs again and we can prescribe him a medication to treat it.

## 2017-01-04 NOTE — Assessment & Plan Note (Signed)
Patient takes Alprazolam 25mg  BID. Will counsel patient more on addiction potential of benzodiazepines at next visit.

## 2017-01-04 NOTE — Assessment & Plan Note (Signed)
Patient last Lipid panel was on 02/15/2016. Will repeat his lipid panel at next visit to ensure his cholesterol is well controlled. Patient had medication with him to verify what he was taking.   Continue Atorvastatin 40mg .

## 2017-01-05 LAB — CMP AND LIVER
ALT: 19 IU/L (ref 0–44)
AST: 19 IU/L (ref 0–40)
Albumin: 4.1 g/dL (ref 3.5–5.5)
Alkaline Phosphatase: 100 IU/L (ref 39–117)
BILIRUBIN TOTAL: 0.6 mg/dL (ref 0.0–1.2)
BUN: 22 mg/dL (ref 6–24)
Bilirubin, Direct: 0.19 mg/dL (ref 0.00–0.40)
CALCIUM: 9.5 mg/dL (ref 8.7–10.2)
CO2: 25 mmol/L (ref 20–29)
CREATININE: 1.37 mg/dL — AB (ref 0.76–1.27)
Chloride: 103 mmol/L (ref 96–106)
GFR, EST AFRICAN AMERICAN: 66 mL/min/{1.73_m2} (ref >59)
GFR, EST NON AFRICAN AMERICAN: 57 mL/min/{1.73_m2} — AB (ref >59)
GLUCOSE: 84 mg/dL (ref 65–99)
Potassium: 4.4 mmol/L (ref 3.5–5.2)
Sodium: 140 mmol/L (ref 134–144)
Total Protein: 6.7 g/dL (ref 6.0–8.5)

## 2017-01-05 LAB — CBC WITH DIFFERENTIAL/PLATELET
BASOS ABS: 0 10*3/uL (ref 0.0–0.2)
BASOS: 1 %
EOS (ABSOLUTE): 0.2 10*3/uL (ref 0.0–0.4)
EOS: 3 %
HEMATOCRIT: 40.9 % (ref 37.5–51.0)
HEMOGLOBIN: 14.4 g/dL (ref 13.0–17.7)
IMMATURE GRANS (ABS): 0 10*3/uL (ref 0.0–0.1)
Immature Granulocytes: 0 %
LYMPHS ABS: 2 10*3/uL (ref 0.7–3.1)
LYMPHS: 31 %
MCH: 31 pg (ref 26.6–33.0)
MCHC: 35.2 g/dL (ref 31.5–35.7)
MCV: 88 fL (ref 79–97)
MONOCYTES: 9 %
Monocytes Absolute: 0.6 10*3/uL (ref 0.1–0.9)
NEUTROS ABS: 3.8 10*3/uL (ref 1.4–7.0)
Neutrophils: 56 %
Platelets: 212 10*3/uL (ref 150–379)
RBC: 4.65 x10E6/uL (ref 4.14–5.80)
RDW: 14.2 % (ref 12.3–15.4)
WBC: 6.5 10*3/uL (ref 3.4–10.8)

## 2017-01-05 LAB — HEMOGLOBIN A1C
Est. average glucose Bld gHb Est-mCnc: 108 mg/dL
Hgb A1c MFr Bld: 5.4 % (ref 4.8–5.6)

## 2017-04-01 ENCOUNTER — Ambulatory Visit (INDEPENDENT_AMBULATORY_CARE_PROVIDER_SITE_OTHER): Payer: Medicare Other | Admitting: Nurse Practitioner

## 2017-04-01 ENCOUNTER — Encounter: Payer: Self-pay | Admitting: Nurse Practitioner

## 2017-04-01 ENCOUNTER — Other Ambulatory Visit: Payer: Self-pay | Admitting: Cardiology

## 2017-04-01 VITALS — BP 150/100 | HR 79 | Ht 68.0 in | Wt 216.8 lb

## 2017-04-01 DIAGNOSIS — I5032 Chronic diastolic (congestive) heart failure: Secondary | ICD-10-CM

## 2017-04-01 DIAGNOSIS — E782 Mixed hyperlipidemia: Secondary | ICD-10-CM

## 2017-04-01 DIAGNOSIS — I10 Essential (primary) hypertension: Secondary | ICD-10-CM

## 2017-04-01 DIAGNOSIS — I2510 Atherosclerotic heart disease of native coronary artery without angina pectoris: Secondary | ICD-10-CM

## 2017-04-01 NOTE — Progress Notes (Signed)
CARDIOLOGY OFFICE NOTE  Date:  04/01/2017    Lonnie Skinner Date of Birth: [birth year] Medical Record #[id number]  PCP:  Nuala Alpha, DO  Cardiologist:  [name]  Chief Complaint  Patient presents with  . Coronary Artery Disease  . Hypertension  . Hyperlipidemia    10 month check - seen for Dr. [name]    History of Present Illness: Lonnie Skinner is a 56 y.o. male who presents today for a 10 month visit. Seen for Dr. [name].   He has CAD, HTN, HLD, stage II-III CKD and hyperglycemia. Followed at the [facility] typically.   Admitted back in early 2015 with CAD/NSTEMI - s/p DES to the mRCA, residual moderate LAD disease (if fails on medical therapy would proceed with PCI). He has typically not followed CV risk factor modification.   Last seen by Dr. [name] back in December of 2017 - some worsening DOE/edema - Lasix was added.   Last seen here by Cecilie Kicks, NP - was doing well. Swelling had improved.      Comes in today. Here with his wife. Says he is doing ok. It's his birthday today. Was here for a check up.  He has not had any medicines today yet - typically takes after eating and has not eaten yet today. BP is up. No chest pain. Not short of breath. Wearing support stockings now and this helps control the swelling - but he is wearing all day and all night. Still does some eating out. His lipids are typically checked thru the [place].   Past Medical History:  Diagnosis Date  . CAD (coronary artery disease)    a. 05/2013: NSTEMI s/p DES to mRCA (culprit vessel), residual moderate LAD disease (the patient fails medical therapy and this was found to be significant, this would be amenable to PCI).  . CHF (congestive heart failure) ([place])   . CKD (chronic kidney disease), stage II   . DVT (deep venous thrombosis) ([place]) 1981  . Hypercholesteremia   . Hyperglycemia   . Hypertension     Past Surgical History:  Procedure Laterality Date  . CORONARY STENT PLACEMENT  05/2013   mRCA   . CORONARY/GRAFT ACUTE MI REVASCULARIZATION  2015  . KNEE SURGERY Left 2009     Medications: Current Meds  Medication Sig  . ALPRAZolam (XANAX) 0.25 MG tablet Take 0.25 mg by mouth at bedtime as needed for anxiety.  Marland Kitchen aspirin EC 81 MG EC tablet Take 1 tablet (81 mg total) by mouth daily.  Marland Kitchen atorvastatin (LIPITOR) 40 MG tablet TAKE 1 TABLET BY MOUTH EVERY EVENING  . carvedilol (COREG) 12.5 MG tablet Take 1 tablet (12.5 mg total) by mouth 2 (two) times daily.  . isosorbide mononitrate (IMDUR) 30 MG 24 hr tablet TAKE 1 TABLET BY MOUTH EVERY DAY  . nitroGLYCERIN (NITROSTAT) 0.4 MG SL tablet Place 1 tablet (0.4 mg total) under the tongue every 5 (five) minutes as needed for chest pain (up to 3 doses).  . prasugrel (EFFIENT) 10 MG TABS tablet Take 1 tablet (10 mg total) by mouth daily.  Marland Kitchen triamcinolone cream (KENALOG) 0.1 % Apply 1 application topically 2 (two) times daily.  . [DISCONTINUED] amoxicillin-clavulanate (AUGMENTIN) 500-125 MG tablet Take 1 tablet by mouth 2 (two) times daily.  . [DISCONTINUED] HYDROcodone-acetaminophen (NORCO/VICODIN) 5-325 MG tablet Take 1 tablet by mouth every 6 (six) hours as needed for moderate pain.  . [DISCONTINUED] ondansetron (ZOFRAN) 4 MG tablet Take 1 tablet (4 mg total) by  mouth every 8 (eight) hours as needed for nausea or vomiting.     Allergies: No Known Allergies  Social History: The patient  reports that he has quit smoking. he has never used smokeless tobacco. He reports that he does not drink alcohol or use drugs.   Family History: The patient's family history includes CAD in his unknown relative; Cancer in his father; Heart attack in his brother and mother; Heart disease in his brother and mother; Heart failure in his mother; Stroke in his mother.   Review of Systems: Please see the history of present illness.   Otherwise, the review of systems is positive for none.   All other systems are reviewed and negative.   Physical Exam: VS:  BP  (!) 150/100 (BP Location: Left Arm, Patient Position: Sitting, Cuff Size: Normal)   Pulse 79   Ht 5\' 8"  (1.727 m)   Wt 216 lb 12.8 oz (98.3 kg)   SpO2 95% Comment: at rest  BMI 32.96 kg/m  .  BMI Body mass index is 32.96 kg/m.  Wt Readings from Last 3 Encounters:  04/01/17 216 lb 12.8 oz (98.3 kg)  01/04/17 216 lb (98 kg)  05/24/16 224 lb 12.8 oz (102 kg)   BP is 140/90 by me.   General: Alert. Looks older than his stated age. He is in no acute distress.  Pretty talkative - loud voice.  HEENT: Normal with missing teeth.  Neck: Supple, no JVD, carotid bruits, or masses noted.  Cardiac: Regular rate and rhythm. No murmurs, rubs, or gallops. No edema. He has his support stockings on bilaterally.   Respiratory:  Lungs are clear to auscultation bilaterally with normal work of breathing.  GI: Soft and nontender.  MS: No deformity or atrophy. Gait and ROM intact.  Skin: Warm and dry. Color is normal.  Neuro:  Strength and sensation are intact and no gross focal deficits noted.  Psych: Alert, appropriate and with normal affect.   LABORATORY DATA:  EKG:  EKG is not ordered today.   Lab Results  Component Value Date   WBC 6.5 01/04/2017   HGB 14.4 01/04/2017   HCT 40.9 01/04/2017   PLT 212 01/04/2017   GLUCOSE 84 01/04/2017   CHOL 113 (L) 02/13/2016   TRIG 107 02/13/2016   HDL 37 (L) 02/13/2016   LDLCALC 55 02/13/2016   ALT 19 01/04/2017   AST 19 01/04/2017   NA 140 01/04/2017   K 4.4 01/04/2017   CL 103 01/04/2017   CREATININE 1.37 (H) 01/04/2017   BUN 22 01/04/2017   CO2 25 01/04/2017   TSH 2.16 09/15/2014   INR 1.05 06/01/2013   HGBA1C 5.4 01/04/2017     BNP (last 3 results) Recent Labs    05/10/16 1121  BNP 10.0    ProBNP (last 3 results) No results for input(s): PROBNP in the last 8760 hours.   Other Studies Reviewed Today:  06/18/13  ECHO Study Conclusions  Left ventricle: The cavity size was normal. There was mild focal basal hypertrophy of the  septum. Systolic function was normal. The estimated ejection fraction was in the range of 55% to 60%. Wall motion was normal; there were no regional wall motion abnormalities. Features are consistent with a pseudonormal left ventricular filling pattern, with concomitant abnormal relaxation and increased filling pressure (grade 2 diastolic dysfunction).  Impressions:  - No prior study for comparison.  ASSESSMENT AND PLAN:   1. Chronic diastolic HF - weight is down. Swelling is controlled with  support stockings and reducing salt.   2. CAD/NSTEMI with PCI with DES to the mid RCA with residual moderate LAD disease from 05/2013 - no active chest pain.   3. HTN - no medicines yet today - recheck is a little better - encouraged him to take his medicines regularly. BP with PCP back in August was ok.   4. CKD - most recent lab noted.   5. HLD - on statin - his lipids are checked thru the [place].    Current medicines are reviewed with the patient today.  The patient does not have concerns regarding medicines other than what has been noted above.  The following changes have been made:  See above.  Labs/ tests ordered today include:   No orders of the defined types were placed in this encounter.    Disposition:   FU with Dr. [name] in one year.   Patient is agreeable to this plan and will call if any problems develop in the interim.   SignedTruitt Merle, NP  04/01/2017 1:48 PM  [facility] [address] [place], [place]  [address] Phone: [phone] Fax: [phone]

## 2017-04-01 NOTE — Patient Instructions (Addendum)
We will be checking the following labs today - NONE   Medication Instructions:    Continue with your current medicines.     Testing/Procedures To Be Arranged:  N/A  Follow-Up:   See Dr. [name] in a year.     Other Special Instructions:   N/A    If you need a refill on your cardiac medications before your next appointment, please call your pharmacy.   Call the [place] office at [phone] if you have any questions, problems or concerns.

## 2017-04-10 ENCOUNTER — Ambulatory Visit: Payer: Medicare Other | Admitting: Family Medicine

## 2017-04-18 ENCOUNTER — Ambulatory Visit (INDEPENDENT_AMBULATORY_CARE_PROVIDER_SITE_OTHER): Payer: Medicare Other | Admitting: Family Medicine

## 2017-04-18 ENCOUNTER — Encounter: Payer: Self-pay | Admitting: Family Medicine

## 2017-04-18 ENCOUNTER — Other Ambulatory Visit: Payer: Self-pay

## 2017-04-18 VITALS — BP 158/98 | HR 71 | Temp 98.3°F | Ht 68.0 in | Wt 219.0 lb

## 2017-04-18 DIAGNOSIS — B356 Tinea cruris: Secondary | ICD-10-CM

## 2017-04-18 DIAGNOSIS — I10 Essential (primary) hypertension: Secondary | ICD-10-CM | POA: Diagnosis not present

## 2017-04-18 DIAGNOSIS — I872 Venous insufficiency (chronic) (peripheral): Secondary | ICD-10-CM

## 2017-04-18 MED ORDER — CLOTRIMAZOLE-BETAMETHASONE 1-0.05 % EX CREA: 1.0000 "application " | TOPICAL_CREAM | Freq: Two times a day (BID) | CUTANEOUS | 0 refills | Status: DC

## 2017-04-18 NOTE — Assessment & Plan Note (Signed)
Patient states he gets intermittent episodes of itching in the groin and buttocks region. He states he bought some OTC cream from [facility] for jock itch and it improves his symptoms but comes back from time to time. He states the cream is expensive.  I have prescribed some clotrimazole cream to see if he can get this cheaper. Per patient, had no active rash or symptoms today but informed patient to return if symptoms return without help from cream.

## 2017-04-18 NOTE — Patient Instructions (Addendum)
It was great to see you again today! Thank you for letting me participate in your care!  Today, we discussed your leg swelling. It has much improved since you began wearing your compression stockings. Please continue to wear these stockings and use the triamcinolone cream as needed for relief from itching of your lower legs.  I have prescribed you clotrimazole cream for your recurrent tinea cruris (jock itch). Please use it as prescribed.   Please call the office with any concerns.  Be well, Harolyn Rutherford, DO PGY-1, [facility]

## 2017-04-18 NOTE — Assessment & Plan Note (Signed)
Patient was hypertensive today and not below goal. However, this was one reading and has been well controlled in the past.   Will reevaluate at next visit and if still elevated will have patient keep BP log and consider adding an additional medication.

## 2017-04-18 NOTE — Progress Notes (Signed)
Subjective: Chief Complaint  Patient presents with  . routine office visit     HPI: Lonnie Skinner is a 56 y.o. presenting to clinic today to discuss the following:  1 Follow up for evaluation of improvement in lower extremity edema 2 Jock itch advise  Mr Lonnie Skinner is a 56y/o male with a PMH of CAD s/p stent placement in 2015, HTN, HLD, and hx of tinea cruris presents today for follow up for his lower extremity edema. He states that he got his compression stockings and has been wearing them as directed. He reports that he has noticed much improvement in his leg swelling and that his right lower leg redness has improved.  He denies headached, congestion, chest pain, SOB, abdominal pain, nausea, vomiting, diarrhea, or constipation.  Health Maintenance: not addressed at this visit     ROS noted in HPI.   Past Medical, Surgical, Social, and Family History Reviewed & Updated per EMR.   Pertinent Historical Findings include:   Social History   Tobacco Use  Smoking Status Former Smoker  Smokeless Tobacco Never Used  Tobacco Comment   Quit ~2007      Objective: BP (!) 158/98   Pulse 71   Temp 98.3 F (36.8 C) (Oral)   Ht 5\' 8"  (1.727 m)   Wt 219 lb (99.3 kg)   SpO2 98%   BMI 33.30 kg/m  Vitals and nursing notes reviewed  Physical Exam Gen: Alert and Oriented x 3, NAD CV: RRR, no murmurs, normal S1, S2 split, +2 pulses posterior tibialis bilaterally Resp: CTAB, no wheezing, rales, or rhonchi, comfortable work of breathing Abd: non-distended, non-tender, soft, +bs in all four quadrants MSK: FROM in all four extremities Ext: no clubbing, cyanosis, trace edema bilaterally with minimal erythema of the right lower extremity below the knee. No uclers. Skin: warm, dry, intact, no rashes  No results found for this or any previous visit (from the past 72 hour(s)).  Assessment/Plan:  Chronic venous insufficiency Lower extremity edema much improved with compliance  in wearing compression stocking. Patient overall states itching has gotten better as well.  Tinea cruris Patient states he gets intermittent episodes of itching in the groin and buttocks region. He states he [facility]ock [facility]ch and it improves his symptoms but comes back from time to time. He states the cream is expensive.  I have prescribed some clotrimazole cream to see if he can get this cheaper. Per patient, had no active rash or symptoms today but informed patient to return if symptoms return without help from cream.  Essential hypertension, benign Patient was hypertensive today and not below goal. However, this was one reading and has been well controlled in the past.   Will reevaluate at next visit and if still elevated will have patient keep BP log and consider adding an additional medication.     PATIENT EDUCATION PROVIDED: See AVS    Diagnosis and plan along with any newly prescribed medication(s) were discussed in detail with this patient today. The patient verbalized understanding and agreed with the plan. Patient advised if symptoms worsen return to clinic or ER.   Health Maintainance:   No orders of the defined types were placed in this encounter.   Meds ordered this encounter  Medications  . clotrimazole-betamethasone (LOTRISONE) cream    Sig: Apply 1 application topically 2 (two) times daily.    Dispense:  30 g    Refill:  0  Harolyn Rutherford, DO 04/18/2017, 2:47 PM PGY-1, [place]

## 2017-04-18 NOTE — Assessment & Plan Note (Signed)
" >>  ASSESSMENT AND PLAN FOR ESSENTIAL HYPERTENSION, BENIGN WRITTEN ON 04/18/2017  4:41 PM BY Kashae Carstens, DO  Patient was hypertensive today and not below goal. However, this was one reading and has been well controlled in the past.   Will reevaluate at next visit and if still elevated will have patient keep BP log and consider adding an additional medication. "

## 2017-04-18 NOTE — Assessment & Plan Note (Signed)
Lower extremity edema much improved with compliance in wearing compression stocking. Patient overall states itching has gotten better as well.

## 2017-04-26 ENCOUNTER — Telehealth: Payer: Self-pay | Admitting: *Deleted

## 2017-04-26 DIAGNOSIS — I5033 Acute on chronic diastolic (congestive) heart failure: Secondary | ICD-10-CM

## 2017-04-26 NOTE — Telephone Encounter (Signed)
Dr. [name], please review this message.  Should he continue taking Effient or should this be discontinued? Pt is being very argumentative with our refill dept over this. Cecille Rubin last saw the pt.  Thanks

## 2017-04-26 NOTE — Telephone Encounter (Signed)
We can switch to Plavix or aspirin alone.

## 2017-04-26 NOTE — Telephone Encounter (Signed)
ERROR

## 2017-04-26 NOTE — Telephone Encounter (Signed)
Attempted to call the pt back to endorse recommendations per Dr [name], and no answer with no VM set-up.

## 2017-04-26 NOTE — Telephone Encounter (Signed)
Lonnie Skinner called today with concerns about continuing to take his Effient. After seeing his PCP, Dr. Garlan Fillers, Fam Med on 04/18/17 who told him to speak with Dr. [name] about the continued use of Effient. I was on the for with Lonnie Skinner well over 20 min about this situation. I told him I would route a message to you as well as Cecille Rubin since she was the last one to see him. I told him that you or Cecille Rubin will be giving him a call back.

## 2017-05-01 MED ORDER — FUROSEMIDE 40 MG PO TABS: 40.0000 mg | ORAL_TABLET | Freq: Every day | ORAL | 3 refills | Status: DC

## 2017-05-01 MED ORDER — NITROGLYCERIN 0.4 MG SL SUBL: 0.4000 mg | SUBLINGUAL_TABLET | SUBLINGUAL | 3 refills | Status: DC | PRN

## 2017-05-01 MED ORDER — CARVEDILOL 12.5 MG PO TABS: 12.5000 mg | ORAL_TABLET | Freq: Two times a day (BID) | ORAL | 3 refills | Status: DC

## 2017-05-01 MED ORDER — CLOPIDOGREL BISULFATE 75 MG PO TABS: 75.0000 mg | ORAL_TABLET | Freq: Every day | ORAL | 3 refills | Status: DC

## 2017-05-01 NOTE — Telephone Encounter (Signed)
I spoke with pt and gave him information from Dr. [name]. He is currently taking ASA 81 mg daily and will continue ( he is out of Effient and has not been taking recently).   He is asking if Dr [name] wants him to take Clopidogrel. Will forward to Dr. [name] for clarification.  Also needs refills of Coreg, Lasix and [facility] [address]  Will send in. He would like Dr. Garlan Fillers notified once decision on clopidogrel has been made.

## 2017-05-01 NOTE — Telephone Encounter (Signed)
I would add clopidogrel 75 mg po daily.

## 2017-05-01 NOTE — Telephone Encounter (Signed)
Pt notified. [facility] [address].  Will forward phone note to Dr. Garlan Fillers to let him know per pt request.

## 2017-05-01 NOTE — Telephone Encounter (Signed)
Pt called and was very upset because he has not heard back from [place] regarding rather or not he needs to take his effient , He states that he is out of the medication so he has not been taking it . Please advise so we can get back with patient ASAP.

## 2017-05-01 NOTE — Telephone Encounter (Signed)
We can switch to Plavix or aspirin alone

## 2017-10-16 ENCOUNTER — Other Ambulatory Visit: Payer: Self-pay | Admitting: Family Medicine

## 2017-11-05 ENCOUNTER — Other Ambulatory Visit: Payer: Self-pay | Admitting: Family Medicine

## 2017-11-17 ENCOUNTER — Other Ambulatory Visit: Payer: Self-pay | Admitting: Cardiology

## 2017-11-19 ENCOUNTER — Encounter

## 2018-05-16 ENCOUNTER — Telehealth: Payer: Self-pay | Admitting: Cardiology

## 2018-05-16 NOTE — Telephone Encounter (Signed)
Returned Lonnie Skinner's call from the [place].  Per Nena Polio, she isn't available. Left a detailed message in regards to message below, provided fax # and call back #@.  Marland Kitchen

## 2018-05-16 NOTE — Telephone Encounter (Signed)
New message   Per Tychelle she needs an updated medication list. Need to know if blood thinner  has been stopped and if so what medication took it's place. You can fax medication list to [phone].

## 2018-05-23 ENCOUNTER — Ambulatory Visit (INDEPENDENT_AMBULATORY_CARE_PROVIDER_SITE_OTHER): Payer: Medicare Other | Admitting: Family Medicine

## 2018-05-23 ENCOUNTER — Encounter: Payer: Self-pay | Admitting: Family Medicine

## 2018-05-23 ENCOUNTER — Other Ambulatory Visit: Payer: Self-pay

## 2018-05-23 VITALS — BP 100/48 | HR 72 | Temp 97.7°F | Wt 201.1 lb

## 2018-05-23 DIAGNOSIS — L03032 Cellulitis of left toe: Secondary | ICD-10-CM | POA: Insufficient documentation

## 2018-05-23 MED ORDER — CEPHALEXIN 500 MG PO CAPS: 1000.0000 mg | ORAL_CAPSULE | Freq: Three times a day (TID) | ORAL | 0 refills | Status: AC

## 2018-05-23 MED ORDER — HYDROCODONE-ACETAMINOPHEN 10-325 MG PO TABS: 1.0000 | ORAL_TABLET | Freq: Four times a day (QID) | ORAL | 0 refills | Status: DC | PRN

## 2018-05-23 NOTE — Assessment & Plan Note (Signed)
Paronychia sounds chronic from his history, but is extremely inflamed today, so he may have an acute on chronic infection.  He may need his toenail removed in the future, but we will treat his current infection to try to reduce the inflammation he currently has.  We will treat with Keflex 1000 mg 3 times daily for 10 days.  Also recommended that patient elevate his foot and ice it.  Prescribed Norco 10-325 mg for pain relief in the short-term.  Asked patient to teach back these instructions to make sure they were clear to him.  He is aware that he needs to return in about 7 to 10 days for follow-up of his infection.

## 2018-05-23 NOTE — Progress Notes (Signed)
     Subjective:    Lonnie Skinner - 58 y.o. male MRN [id number]  Date of birth: 08-16-1960  CC:  Lonnie Skinner is here for pain in his L great toe.  HPI: Toe pain - started in August - has used clotrimazole and hydrogen peroxide but his toe seems to be getting worse - has tried to keep it as clean possible and clipped his toe nail yesterday - isn't sure if there has been any pustular drainage, but says that it bleeds frequently - cannot identify any alleviating or exacerbating factors - denies fevers, chills, body aches Health Maintenance:  Health Maintenance Due  Topic Date Due  . Hepatitis C Screening  1961-01-06  . HIV Screening  04/02/1975  . TETANUS/TDAP  04/02/1979  . COLONOSCOPY  04/01/2010    -  reports that he has quit smoking. He has never used smokeless tobacco. - Review of Systems: Per HPI. - Past Medical History: Patient Active Problem List   Diagnosis Date Noted  . Paronychia of great toe of left foot 05/23/2018  . Tinea cruris 04/18/2017  . Hx of tinea cruris 01/04/2017  . Encounter for medical examination to establish care 01/04/2017  . Chronic venous insufficiency 01/04/2017  . Acute on chronic diastolic CHF (congestive heart failure) ([place]) 05/10/2016  . CAD (coronary artery disease)   . Hyperglycemia   . CKD (chronic kidney disease), stage II   . History of tobacco abuse 06/02/2013  . Acute myocardial infarction of other inferior wall, initial episode of care   . NSTEMI (non-ST elevated myocardial infarction) ([place]) 06/01/2013  . Hyperlipidemia 05/19/2009  . Generalized anxiety disorder 05/19/2009  . NICOTINE ADDICTION 05/19/2009  . ADD 05/19/2009  . MENTAL RETARDATION 05/19/2009  . Essential hypertension, benign 05/19/2009  . UNEQUAL LEG LENGTH 05/19/2009   - Medications: reviewed and updated   Objective:   Physical Exam BP (!) 100/48   Pulse 72   Temp 97.7 F (36.5 C) (Oral)   Wt 201 lb 2 oz (91.2 kg)   SpO2 98%   BMI 30.58 kg/m  Gen:  NAD, alert, cooperative with exam, well-appearing, mental delay is apparent HEENT: NCAT, clear conjunctiva, poor dentition CV: RRR, good S1/S2, no murmur, 1+ pitting edema on bilateral ankles Resp: CTABL, no wheezes, non-labored Extremities: Left great toe with significant erythema surrounding entire border of toenail with hypertrophic tissue on the lateral side of the toenail.  Toe is exquisitely tender to light palpation.  No fluctuance palpated or pus pocket visualized.          Assessment & Plan:   Paronychia of great toe of left foot Paronychia sounds chronic from his history, but is extremely inflamed today, so he may have an acute on chronic infection.  He may need his toenail removed in the future, but we will treat his current infection to try to reduce the inflammation he currently has.  We will treat with Keflex 1000 mg 3 times daily for 10 days.  Also recommended that patient elevate his foot and ice it.  Prescribed Norco 10-325 mg for pain relief in the short-term.  Asked patient to teach back these instructions to make sure they were clear to him.  He is aware that he needs to return in about 7 to 10 days for follow-up of his infection.    Maia Breslow, M.D. 05/23/2018, 5:00 PM PGY-2, [place]

## 2018-05-23 NOTE — Patient Instructions (Addendum)
It was nice meeting you today [name]!  You have an infection of your toe which should improve with the antibiotic we are giving you, called cephalexin.  Please take 2 pills of this medication 3 times per day for 10 days.  We are also giving you a medicine called Norco for your pain.  Please take it as you need it every 6 hours, or less frequently if you are not in pain.  We need to see you again in about 7 to 10 days to follow-up on how your toe is doing.  If you have any questions or concerns, please feel free to call the clinic.   Be well,  Dr. Shan Levans

## 2018-06-02 ENCOUNTER — Encounter: Payer: Self-pay | Admitting: Family Medicine

## 2018-06-02 ENCOUNTER — Ambulatory Visit (INDEPENDENT_AMBULATORY_CARE_PROVIDER_SITE_OTHER): Payer: Medicare Other | Admitting: Family Medicine

## 2018-06-02 ENCOUNTER — Other Ambulatory Visit: Payer: Self-pay

## 2018-06-02 VITALS — BP 130/70 | HR 64 | Temp 98.3°F | Wt 201.6 lb

## 2018-06-02 DIAGNOSIS — Z114 Encounter for screening for human immunodeficiency virus [HIV]: Secondary | ICD-10-CM

## 2018-06-02 DIAGNOSIS — Z1159 Encounter for screening for other viral diseases: Secondary | ICD-10-CM | POA: Diagnosis not present

## 2018-06-02 DIAGNOSIS — L03032 Cellulitis of left toe: Secondary | ICD-10-CM | POA: Diagnosis not present

## 2018-06-02 DIAGNOSIS — I10 Essential (primary) hypertension: Secondary | ICD-10-CM

## 2018-06-02 DIAGNOSIS — I25119 Atherosclerotic heart disease of native coronary artery with unspecified angina pectoris: Secondary | ICD-10-CM | POA: Diagnosis not present

## 2018-06-02 MED ORDER — MUPIROCIN 2 % EX OINT: 1.0000 "application " | TOPICAL_OINTMENT | Freq: Two times a day (BID) | CUTANEOUS | 0 refills | Status: DC

## 2018-06-02 NOTE — Patient Instructions (Addendum)
It was wonderful to see you today.  Thank you for choosing [facility].   Please call [phone] with any questions about today's appointment.  Please be sure to schedule follow up at the front  desk before you leave today.   Dorris Singh, MD  Family Medicine   Please call if you have worsening pain, drainage, or fevers. If the redness progresses, please give [place] a call    Instructions for Left Toe: - Wash foot in warm soapy water twice a day - Wear a CLEAN DRY sock every day - You do not have to wear a sock at night - Use Bactroban ointment TWICE per day for the next week  I have referred you to Podiatry---in the future, I would recommend evaluation for peripheral vascular disease

## 2018-06-02 NOTE — Progress Notes (Signed)
  Patient Name: Lonnie Skinner Date of Birth: 1961/05/20 Date of Visit: 06/02/18 PCP: Nuala Alpha, DO  Chief Complaint: follow up for paronychia   Subjective: Lonnie Skinner is a pleasant 58 y.o. year old gentleman with medical history significant for CAD, venous stasis, tobacco abuse (quit 2007), intellectual disability,  And CKD presenting with concerns of a foot infection. The patient was seen on 05/23/2018 by Dr. Shan Levans for paronychia of the left great toe, lateral worse than medial. He has been taking Cephalexin--- per notes 1,000 mg TID. He reports improvement in redness and drainage. His pain is minimal, he has not used any Norco. He is eating and drinking well. No fevers, chills, nausea, vomiting. He has been washing his foot once per day, no local wound care. He has been wearing wet loafers (purchased by his wife) for several days.  Of note, the patient is under considerable stress. His wife sustained a hip fracture earlier this year. His mother is also ill. He reports he feels overwhelmed at times.   ROS:  ROS As above.   I have reviewed the patient's medical, surgical, family, and social history as appropriate.   Vitals:   06/02/18 0920  BP: 130/70  Pulse: 64  Temp: 98.3 F (36.8 C)  SpO2: 98%   Filed Weights   06/02/18 0920  Weight: 201 lb 9.6 oz (91.4 kg)   HEENT: Sclera anicteric. Dentition is poor. Appears well hydrated. Neck: Supple Cardiac: Regular rate and rhythm. Normal S1/S2. No murmurs, rubs, or gallops appreciated. Lungs: Clear bilaterally to ascultation.   Bilateral great toe in ingrown nails on bilateral (medial/lateral) sides of nail fold,  No abscess over left great toe on medial or lateral side Minimal erythema of left great toe Bilateral feet are wet, malodorous       Lonnie Skinner was seen today for follow-up.  Diagnoses and all orders for this visit:  Paronychia of great toe, left -     Ambulatory referral to Podiatry -     mupirocin ointment  (BACTROBAN) 2 %; Place 1 application into the nose 2 (two) times daily for 1 week  - Recommended warm soaks twice daily, then application of above -  Lateral side is draining today, no role for incision and drainage (already draining)  - Medial side has more chronic component, likely needs avulsion of nail  - Reviewed strict signs and symptoms and reasons to return to care  - Discussed proper foot hygiene at length  - The patient has numerous stressors in his family, he may benefit from an evaluation for peripheral vascular disease in the future--- including referral to Vascular Surgery (has chronic venous stasis on right leg, suspect component of arterial disease contributing to nail disease on left). Consider in future/at follow up.   Screening for HIV (human immunodeficiency virus) -     HIV antibody (with reflex)  Encounter for hepatitis C screening test for low risk patient -     Hepatitis c antibody (reflex)  Essential hypertension, benign -     Basic Metabolic Panel  Coronary artery disease involving native coronary artery of native heart with angina pectoris ([place]) -     Lipid Panel  Dorris Singh, MD  Family Medicine Teaching Service

## 2018-06-03 ENCOUNTER — Encounter: Payer: Self-pay | Admitting: Family Medicine

## 2018-06-03 LAB — LIPID PANEL
CHOLESTEROL TOTAL: 106 mg/dL (ref 100–199)
Chol/HDL Ratio: 2.9 ratio (ref 0.0–5.0)
HDL: 36 mg/dL — AB (ref >39)
LDL Calculated: 52 mg/dL (ref 0–99)
TRIGLYCERIDES: 91 mg/dL (ref 0–149)
VLDL CHOLESTEROL CAL: 18 mg/dL (ref 5–40)

## 2018-06-03 LAB — BASIC METABOLIC PANEL
BUN/Creatinine Ratio: 9 (ref 9–20)
BUN: 10 mg/dL (ref 6–24)
CHLORIDE: 100 mmol/L (ref 96–106)
CO2: 23 mmol/L (ref 20–29)
Calcium: 9.6 mg/dL (ref 8.7–10.2)
Creatinine, Ser: 1.12 mg/dL (ref 0.76–1.27)
GFR calc non Af Amer: 72 mL/min/{1.73_m2} (ref >59)
GFR, EST AFRICAN AMERICAN: 83 mL/min/{1.73_m2} (ref >59)
GLUCOSE: 90 mg/dL (ref 65–99)
Potassium: 4.2 mmol/L (ref 3.5–5.2)
Sodium: 137 mmol/L (ref 134–144)

## 2018-06-03 LAB — HIV ANTIBODY (ROUTINE TESTING W REFLEX): HIV Screen 4th Generation wRfx: NONREACTIVE

## 2018-06-03 LAB — HEPATITIS C ANTIBODY (REFLEX): HCV Ab: <0.1 s/co ratio (ref 0.0–0.9)

## 2018-06-03 LAB — HCV COMMENT:

## 2018-06-03 NOTE — Progress Notes (Signed)
Normal BMET, LDL at goal. HIV/HCV negative. Generic letter sent with normal results.

## 2018-06-07 ENCOUNTER — Other Ambulatory Visit: Payer: Self-pay | Admitting: Cardiology

## 2018-06-07 DIAGNOSIS — I5033 Acute on chronic diastolic (congestive) heart failure: Secondary | ICD-10-CM

## 2018-06-10 ENCOUNTER — Ambulatory Visit: Payer: Medicare Other | Admitting: Family Medicine

## 2018-06-10 ENCOUNTER — Other Ambulatory Visit: Payer: Self-pay | Admitting: Cardiology

## 2018-06-10 DIAGNOSIS — I5033 Acute on chronic diastolic (congestive) heart failure: Secondary | ICD-10-CM

## 2018-06-11 ENCOUNTER — Ambulatory Visit: Payer: Medicare Other | Admitting: Podiatrist

## 2018-06-18 ENCOUNTER — Ambulatory Visit: Payer: Medicare Other | Admitting: Podiatry

## 2018-06-30 ENCOUNTER — Ambulatory Visit: Payer: Medicare Other | Admitting: Family Medicine

## 2018-06-30 ENCOUNTER — Ambulatory Visit: Payer: Medicare Other | Admitting: Podiatry

## 2018-07-07 ENCOUNTER — Encounter: Payer: Self-pay | Admitting: Podiatry

## 2018-07-07 ENCOUNTER — Ambulatory Visit (INDEPENDENT_AMBULATORY_CARE_PROVIDER_SITE_OTHER): Payer: Medicare Other | Admitting: Podiatry

## 2018-07-07 VITALS — BP 155/92 | HR 62 | Resp 16

## 2018-07-07 DIAGNOSIS — L600 Ingrowing nail: Secondary | ICD-10-CM

## 2018-07-07 DIAGNOSIS — L309 Dermatitis, unspecified: Secondary | ICD-10-CM

## 2018-07-07 DIAGNOSIS — L03032 Cellulitis of left toe: Secondary | ICD-10-CM | POA: Diagnosis not present

## 2018-07-07 NOTE — Progress Notes (Signed)
   Subjective:    Patient ID: Lonnie Skinner, male    DOB: 09-07-1960, 58 y.o.   MRN: [id number]  HPI    Review of Systems  All other systems reviewed and are negative.      Objective:   Physical Exam        Assessment & Plan:

## 2018-07-07 NOTE — Patient Instructions (Signed)

## 2018-07-09 NOTE — Progress Notes (Signed)
Subjective:   Patient ID: Lonnie Skinner, male   DOB: 58 y.o.   MRN: [id number]   HPI Patient presents with significant infection of the left big toenail and stated that he took antibiotics which were helpful but it is still very bad and he was referred here.  States is been going on for 2 months and patient does not currently smokes and would like to be more active but cannot wear shoe gear currently   Review of Systems  All other systems reviewed and are negative.       Objective:  Physical Exam Vitals signs and nursing note reviewed.  Constitutional:      Appearance: He is well-developed.  Pulmonary:     Effort: Pulmonary effort is normal.  Musculoskeletal: Normal range of motion.  Skin:    General: Skin is warm.  Neurological:     Mental Status: He is alert.     Neurovascular status intact muscle strength is adequate range of motion within normal limits with patient noted to have significant drainage of the medial lateral border of the hallux left that is localized with no proximal edema erythema drainage or systemic signs of infection.  There is moderate necrotic tissue and it is painful when palpated but it is localized in its nature with patient found to have good digital perfusion and well oriented x3     Assessment:  Significant paronychia infection both medial and lateral border of the left hallux localized in nature with no indications of proximal extension     Plan:  H&P and education concerning condition rendered to patient.  Today I infiltrated the left hallux 60 mg like Marcaine mixture and using sterile instrumentation I did a paronychia excision x2 removing both medial and lateral border along with abscess tissue necrotic tissue and created channels for drainage.  There was no active drainage to culture currently and it is superficial so I do not think we would get any good answers and at this point I instructed on the importance of soaks and not wearing shoe gear.   Patient will be seen back to recheck and was given strict instructions to call us if any redness or systemic signs of infection were to occur and if any systemic infection were to occur to go straight to the emergency room where he will most likely require IV antibiotics

## 2018-10-30 ENCOUNTER — Ambulatory Visit (INDEPENDENT_AMBULATORY_CARE_PROVIDER_SITE_OTHER): Payer: Medicare Other | Admitting: Family Medicine

## 2018-10-30 ENCOUNTER — Other Ambulatory Visit: Payer: Self-pay

## 2018-10-30 ENCOUNTER — Other Ambulatory Visit: Payer: Self-pay | Admitting: Family Medicine

## 2018-10-30 VITALS — BP 138/80 | HR 78 | Wt 212.2 lb

## 2018-10-30 DIAGNOSIS — F411 Generalized anxiety disorder: Secondary | ICD-10-CM

## 2018-10-30 DIAGNOSIS — I10 Essential (primary) hypertension: Secondary | ICD-10-CM

## 2018-10-30 DIAGNOSIS — I5033 Acute on chronic diastolic (congestive) heart failure: Secondary | ICD-10-CM

## 2018-10-30 DIAGNOSIS — I25119 Atherosclerotic heart disease of native coronary artery with unspecified angina pectoris: Secondary | ICD-10-CM

## 2018-10-30 MED ORDER — ALPRAZOLAM 0.25 MG PO TABS: 0.2500 mg | ORAL_TABLET | Freq: Two times a day (BID) | ORAL | 0 refills | Status: DC | PRN

## 2018-10-30 MED ORDER — ASPIRIN 81 MG PO TBEC: 81.0000 mg | DELAYED_RELEASE_TABLET | Freq: Every day | ORAL | Status: DC

## 2018-10-30 MED ORDER — ISOSORBIDE MONONITRATE ER 30 MG PO TB24: 30.0000 mg | ORAL_TABLET | Freq: Every day | ORAL | 0 refills | Status: DC

## 2018-10-30 MED ORDER — ATORVASTATIN CALCIUM 40 MG PO TABS: 40.0000 mg | ORAL_TABLET | Freq: Every evening | ORAL | 0 refills | Status: DC

## 2018-10-30 MED ORDER — CARVEDILOL 12.5 MG PO TABS: 12.5000 mg | ORAL_TABLET | Freq: Two times a day (BID) | ORAL | 0 refills | Status: DC

## 2018-10-30 MED ORDER — CLOPIDOGREL BISULFATE 75 MG PO TABS: 75.0000 mg | ORAL_TABLET | Freq: Every day | ORAL | 3 refills | Status: DC

## 2018-10-30 MED ORDER — FUROSEMIDE 40 MG PO TABS: 40.0000 mg | ORAL_TABLET | Freq: Every day | ORAL | 0 refills | Status: DC

## 2018-10-30 NOTE — Assessment & Plan Note (Signed)
Patient on Alprazolam 0.25mg  TID prn from [place]. We discussed eventually weaning off this medication as it is not good for chronic anxiety and has abuse/addiction potential. Patient is mentally impaired which does make these discussion more challenging. - Refill Alprazolam 0.25mg  BID prn, by next visit decrease to once per day PRN as goal - Start Sertraline 25mg  at next visit and then increase to 50mg  after 1 week.

## 2018-10-30 NOTE — Assessment & Plan Note (Signed)
" >>  ASSESSMENT AND PLAN FOR ESSENTIAL HYPERTENSION, BENIGN WRITTEN ON 10/30/2018 10:54 AM BY Esty Ahuja, DO  BP well controlled today. - Cont Carvediolol  12.5mg  BID daily - cont Furosemide  40mg  daily "

## 2018-10-30 NOTE — Patient Instructions (Signed)
It was great to see you today! Thank you for letting me participate in your care!  Today, we discussed your current medications and I have given you refills on all the ones listed on this paper. Please take them as prescribed. I want you to be seen by a Cardiologist soon so I have sent in a referral to your old Cardiologist and if they are truly no longer open we will refer you to a new one.  Be well, Harolyn Rutherford, DO PGY-2, [facility]

## 2018-10-30 NOTE — Assessment & Plan Note (Signed)
BP well controlled today. - Cont Carvediolol  12.5mg  BID daily - cont Furosemide 40mg  daily

## 2018-10-30 NOTE — Assessment & Plan Note (Signed)
Will send in referral first to Dr. Francesca O[place] office to help him get in contact with them and get an appointment if possible. If not, referral to a new Cardiologist. - Cont ASA 81mg  daily - Cont Plavix 75mg  daily - Cont Atrovastatin 40mg  daily

## 2018-10-30 NOTE — Progress Notes (Signed)
Subjective: Chief Complaint  Patient presents with  . Medication Refill     HPI: Lonnie Skinner is a 58 y.o. presenting to clinic today to discuss the following:  Medication Refills Patient has been going to Cardiology with Dr. Ottie Glazier. He states he has called her office to schedule a visit but only gets a voicemail stating her office is "permanently closed". As a result he has been unable to have his regular Cardiology check up and is in need of medication refills typically prescribed by his Cardiologist.  Pain in Feet Patient was seen and treated for paronychia of both great toes at [facility]. They have healed well. He has remaining onychomycosis but uses OTC treatment. He still has some pain in his feet he says "sometimes" but report today they feel well and has no pain. Given history of CAD and foot problems will do ABI today to evaluate for PAD.  Anxiety Chronic issue for which he was given Alprazolam 0.25 to be taken TID PRN by his doctor at the [place]. His doctor at the [place] has since retired and he now wants me to manage his anxiety and wants a refill on alprazolam. We discussed this is not a good chronic medication and we will slowly wean him off of this medication and start him on different medications to better control his anxiety.  ROS noted in HPI.   Past Medical, Surgical, Social, and Family History Reviewed & Updated per EMR.   Pertinent Historical Findings include:   Social History   Tobacco Use  Smoking Status Former Smoker  Smokeless Tobacco Never Used  Tobacco Comment   Quit ~2007    Objective: BP 138/80   Pulse 78   SpO2 95%  Vitals and nursing notes reviewed  Physical Exam Gen: Alert and Oriented x 3, NAD HEENT: Normocephalic, atraumatic CV: RRR, 2/6 systolic murmur, normal S1, S2 split Resp: CTAB, no wheezing, rales, or rhonchi, comfortable work of breathing Ext: no clubbing, cyanosis, or pitting edema Skin: warm, dry,  intact, on lower shin has a mild erythematous chronic appearing rash, consistent with chronic LE edema  Assessment/Plan:  CAD (coronary artery disease) Will send in referral first to Dr. Francesca Oman office to help him get in contact with them and get an appointment if possible. If not, referral to a new Cardiologist. - Cont ASA 81mg  daily - Cont Plavix 75mg  daily - Cont Atrovastatin 40mg  daily  Essential hypertension, benign BP well controlled today. - Cont Carvediolol  12.5mg  BID daily - cont Furosemide 40mg  daily  Generalized anxiety disorder Patient on Alprazolam 0.25mg  TID prn from [place]. We discussed eventually weaning off this medication as it is not good for chronic anxiety and has abuse/addiction potential. Patient is mentally impaired which does make these discussion more challenging. - Refill Alprazolam 0.25mg  BID prn, by next visit decrease to once per day PRN as goal - Start Sertraline 25mg  at next visit and then increase to 50mg  after 1 week.    PATIENT EDUCATION PROVIDED: See AVS    Diagnosis and plan along with any newly prescribed medication(s) were discussed in detail with this patient today. The patient verbalized understanding and agreed with the plan. Patient advised if symptoms worsen return to clinic or ER.    Orders Placed This Encounter  Procedures  . Ambulatory referral to Cardiology    Referral Priority:   Routine    Referral Type:   Consultation    Referral Reason:  Specialty Services Required    Requested Specialty:   Cardiology    Number of Visits Requested:   1  . POCT ABI Screening Pilot No Charge    This Order is for screening for the ABI Pilot.  It is a no charge exam.     Meds ordered this encounter  Medications  . clopidogrel (PLAVIX) 75 MG tablet    Sig: Take 1 tablet (75 mg total) by mouth daily.    Dispense:  90 tablet    Refill:  3  . DISCONTD: carvedilol (COREG) 12.5 MG tablet    Sig: Take 1 tablet (12.5 mg total) by mouth 2 (two) times  daily. Please schedule overdue appt for future refills 1st attempt.    Dispense:  60 tablet    Refill:  0    Please call our office to schedule overdue appt 1st attempt [phone].  Marland Kitchen DISCONTD: atorvastatin (LIPITOR) 40 MG tablet    Sig: Take 1 tablet (40 mg total) by mouth every evening. Please schedule overdue appt for future refills. 1st attempt.    Dispense:  30 tablet    Refill:  0    Please call our office to schedule overdue appt 1st attempt [phone].  Marland Kitchen DISCONTD: furosemide (LASIX) 40 MG tablet    Sig: Take 1 tablet (40 mg total) by mouth daily. Please schedule overdue appt for future refills 1st attempt.    Dispense:  30 tablet    Refill:  0    Please call our office to schedule overdue appt for future refills. 1st attempt [phone].  Marland Kitchen DISCONTD: isosorbide mononitrate (IMDUR) 30 MG 24 hr tablet    Sig: Take 1 tablet (30 mg total) by mouth daily. Please schedule overdue appt for future refills 1st attempt.    Dispense:  30 tablet    Refill:  0    Please call our office to schedule overdue appt for future refills 1st attempt [phone].  Marland Kitchen aspirin 81 MG EC tablet    Sig: Take 1 tablet (81 mg total) by mouth daily.    Dispense:  30 tablet  . ALPRAZolam (XANAX) 0.25 MG tablet    Sig: Take 1 tablet (0.25 mg total) by mouth 2 (two) times daily as needed for anxiety.    Dispense:  60 tablet    Refill:  0     Tim Garlan Fillers, DO 10/30/2018, 9:07 AM PGY-2 [place]

## 2018-10-31 ENCOUNTER — Telehealth: Payer: Self-pay | Admitting: Physician Assistant

## 2018-10-31 NOTE — Telephone Encounter (Signed)
New Message     Pt is wondering if he can be seen in the office      Please call back

## 2018-10-31 NOTE — Telephone Encounter (Signed)
Called and spoke to the patient. He does not want to come into office. He wants to keep virtual appointment already scheduled.     Virtual Visit Pre-Appointment Phone Call TELEPHONE CALL NOTE  STEPHENSON CICHY has been deemed a candidate for a follow-up tele-health visit to limit community exposure during the Covid-19 pandemic. I spoke with the patient via phone to ensure availability of phone/video source, confirm preferred email & phone number, and discuss instructions and expectations.  I reminded [name] to be prepared with any vital sign and/or heart rhythm information that could potentially be obtained via home monitoring, at the time of his visit. I reminded [name] to expect a phone call prior to his visit.  Patient agrees to consent below.  Cleon Gustin, RN 10/31/2018 2:46 PM   FULL LENGTH CONSENT FOR TELE-HEALTH VISIT   I hereby voluntarily request, consent and authorize [facility] and its employed or contracted physicians, physician assistants, nurse practitioners or other licensed health care professionals (the Practitioner), to provide me with telemedicine health care services (the "Services") as deemed necessary by the treating Practitioner. I acknowledge and consent to receive the Services by the Practitioner via telemedicine. I understand that the telemedicine visit will involve communicating with the Practitioner through live audiovisual communication technology and the disclosure of certain medical information by electronic transmission. I acknowledge that I have been given the opportunity to request an in-person assessment or other available alternative prior to the telemedicine visit and am voluntarily participating in the telemedicine visit.  I understand that I have the right to withhold or withdraw my consent to the use of telemedicine in the course of my care at any time, without affecting my right to future care or treatment, and that the Practitioner or  I may terminate the telemedicine visit at any time. I understand that I have the right to inspect all information obtained and/or recorded in the course of the telemedicine visit and may receive copies of available information for a reasonable fee.  I understand that some of the potential risks of receiving the Services via telemedicine include:  Marland Kitchen Delay or interruption in medical evaluation due to technological equipment failure or disruption; . Information transmitted may not be sufficient (e.g. poor resolution of images) to allow for appropriate medical decision making by the Practitioner; and/or  . In rare instances, security protocols could fail, causing a breach of personal health information.  Furthermore, I acknowledge that it is my responsibility to provide information about my medical history, conditions and care that is complete and accurate to the best of my ability. I acknowledge that Practitioner's advice, recommendations, and/or decision may be based on factors not within their control, such as incomplete or inaccurate data provided by me or distortions of diagnostic images or specimens that may result from electronic transmissions. I understand that the practice of medicine is not an exact science and that Practitioner makes no warranties or guarantees regarding treatment outcomes. I acknowledge that I will receive a copy of this consent concurrently upon execution via email to the email address I last provided but may also request a printed copy by calling the office of [place].    I understand that my insurance will be billed for this visit.   I have read or had this consent read to me. . I understand the contents of this consent, which adequately explains the benefits and risks of the Services being provided via telemedicine.  . I have been  provided ample opportunity to ask questions regarding this consent and the Services and have had my questions answered to my satisfaction. .  I give my informed consent for the services to be provided through the use of telemedicine in my medical care  By participating in this telemedicine visit I agree to the above.

## 2018-11-03 NOTE — Progress Notes (Signed)
Virtual Visit via Telephone Note   This visit type was conducted due to national recommendations for restrictions regarding the COVID-19 Pandemic (e.g. social distancing) in an effort to limit this patient's exposure and mitigate transmission in our community.  Due to his co-morbid illnesses, this patient is at least at moderate risk for complications without adequate follow up.  This format is felt to be most appropriate for this patient at this time.  The patient did not have access to video technology/had technical difficulties with video requiring transitioning to audio format only (telephone).  All issues noted in this document were discussed and addressed.  No physical exam could be performed with this format.  Please refer to the patient's chart for his  consent to telehealth for Lonnie Skinner.   Date:  11/04/2018   ID:  Lonnie Skinner, DOB 1960-10-28, MRN [id number]  Patient Location: Home Provider Location: Home  PCP:  Nuala Alpha, DO  Cardiologist:  Ena Dawley, MD   Electrophysiologist:  None   Evaluation Performed:  Follow-Up Visit  Chief Complaint:   F/u   History of Present Illness:    Lonnie Skinner is a 58 y.o. male with history of CAD status post NSTEMI 2015 treated with DES to the mid RCA with residual LAD disease if fails on medical therapy would proceed with PCI.  Also has history of hypertension, chronic diastolic CHF, HLD, CKD stage II-III, hyperglycemia.  Typically followed at the [place].  Patient is mentally impaired.  Last seen by a provider in our office 03/2017 BP was high but he had not taken his medicines that day.  Patient says he takes care of his 5 yr old mother and his wife who broke her leg and has complications. Denies chest pain, shortness of breath, dizziness, edema or presyncope.    The patient does not have symptoms concerning for COVID-19 infection (fever, chills, cough, or new shortness of breath).    Past Medical History:  Diagnosis  Date  . CAD (coronary artery disease)    a. 05/2013: NSTEMI s/p DES to mRCA (culprit vessel), residual moderate LAD disease (the patient fails medical therapy and this was found to be significant, this would be amenable to PCI).  . CHF (congestive heart failure) ([place])   . CKD (chronic kidney disease), stage II   . DVT (deep venous thrombosis) ([place]) 1981  . Hypercholesteremia   . Hyperglycemia   . Hypertension    Past Surgical History:  Procedure Laterality Date  . CORONARY STENT PLACEMENT  05/2013   mRCA  . CORONARY/GRAFT ACUTE MI REVASCULARIZATION  2015  . KNEE SURGERY Left 2009  . LEFT HEART CATHETERIZATION WITH CORONARY ANGIOGRAM N/A 06/02/2013   Procedure: LEFT HEART CATHETERIZATION WITH CORONARY ANGIOGRAM;  Surgeon: [name], MD;  Location: [facility] C[facility];  Service: Cardiovascular;  Laterality: N/A;  . PERCUTANEOUS CORONARY STENT INTERVENTION (PCI-S)  06/02/2013   Procedure: PERCUTANEOUS CORONARY STENT INTERVENTION (PCI-S);  Surgeon: [name], MD;  Location: [facility] CATH LAB;  Service: Cardiovascular;;     Current Meds  Medication Sig  . ALPRAZolam (XANAX) 0.25 MG tablet Take 1 tablet (0.25 mg total) by mouth 2 (two) times daily as needed for anxiety.  Marland Kitchen aspirin 81 MG EC tablet Take 1 tablet (81 mg total) by mouth daily.  Marland Kitchen atorvastatin (LIPITOR) 40 MG tablet TAKE 1 TABLET BY MOUTH EVERY EVENING  . carvedilol (COREG) 12.5 MG tablet TAKE 1 TABLET BY MOUTH TWICE DAILY  . clopidogrel (PLAVIX) 75 MG  tablet Take 1 tablet (75 mg total) by mouth daily.  . clotrimazole-betamethasone (LOTRISONE) cream APPLY TO THE AFFECTED AREA TWICE DAILY  . furosemide (LASIX) 40 MG tablet TAKE 1 TABLET BY MOUTH DAILY  . isosorbide mononitrate (IMDUR) 30 MG 24 hr tablet TAKE 1 TABLET BY MOUTH DAILY, PLEASE SCHEDULE OVERDUE APPOINTMENT FOR FUTURE REFILLS 1ST ATTEMPT  . mupirocin ointment (BACTROBAN) 2 % Place 1 application into the nose 2 (two) times daily.  . nitroGLYCERIN (NITROSTAT) 0.4 MG  SL tablet Place 1 tablet (0.4 mg total) under the tongue every 5 (five) minutes as needed for chest pain (up to 3 doses).  . triamcinolone cream (KENALOG) 0.1 % APPLY TOPICALLY TO THE AFFECTED AREA TWICE DAILY     Allergies:   Patient has no known allergies.   Social History   Tobacco Use  . Smoking status: Former Research scientist (life sciences)  . Smokeless tobacco: Never Used  . Tobacco comment: Quit ~2007  Substance Use Topics  . Alcohol use: No  . Drug use: No     Family Hx: The patient's family history includes CAD in an other family member; Cancer in his father; Heart attack in his brother and mother; Heart disease in his brother and mother; Heart failure in his mother; Stroke in his mother.  ROS:   Please see the history of present illness.      All other systems reviewed and are negative.   Prior CV studies:   The following studies were reviewed today:   06/18/13  ECHO Study Conclusions  Left ventricle: The cavity size was normal. There was mild focal basal hypertrophy of the septum. Systolic function was normal. The estimated ejection fraction was in the range of 55% to 60%. Wall motion was normal; there were no regional wall motion abnormalities. Features are consistent with a pseudonormal left ventricular filling pattern, with concomitant abnormal relaxation and increased filling pressure (grade 2 diastolic dysfunction).  Impressions:  - No prior study for comparison.     Labs/Other Tests and Data Reviewed:    EKG:  No ECG reviewed.  Recent Labs: 06/02/2018: BUN 10; Creatinine, Ser 1.12; Potassium 4.2; Sodium 137   Recent Lipid Panel Lab Results  Component Value Date/Time   CHOL 106 06/02/2018 10:07 AM   TRIG 91 06/02/2018 10:07 AM   HDL 36 (L) 06/02/2018 10:07 AM   CHOLHDL 2.9 06/02/2018 10:07 AM   CHOLHDL 3.1 02/13/2016 03:40 PM   LDLCALC 52 06/02/2018 10:07 AM    Wt Readings from Last 3 Encounters:  11/04/18 212 lb (96.2 kg)  10/30/18 212 lb 3.2 oz (96.3 kg)   06/02/18 201 lb 9.6 oz (91.4 kg)     Objective:    Vital Signs:  Ht 5\' 8"  (1.727 m)   Wt 212 lb (96.2 kg)   BMI 32.23 kg/m   BP 138/80 Pulse 78 on 10/30/18 at PCP  VITAL SIGNS:  reviewed  ASSESSMENT & PLAN:    1. CAD post end STEMI treated with DES to the mid RCA 2015 with residual moderate LAD disease-on Plavix aspirin, carvedilol and atorvastatin-no angina 2. Essential hypertension-BP controlled at PCP 3. Chronic diastolic CHF on Lasix 40 mg daily-no edema or symptoms 4. CKD creatinine 1.12-05/2018 5. Hyperlipidemia DL 52 06/02/2018 6. Anxiety-asking for refills on Alprazolam which he just received from Dr. Garlan Skinner on 10/30/18. I told him we won't refill this medication and he needs to go through Dr. Garlan Skinner.  COVID-19 Education: The signs and symptoms of COVID-19 were discussed with the patient and  how to seek care for testing (follow up with PCP or arrange E-visit).   The importance of social distancing was discussed today.  Time:   Today, I have spent 22 minutes with the patient with telehealth technology discussing the above problems.     Medication Adjustments/Labs and Tests Ordered: Current medicines are reviewed at length with the patient today.  Concerns regarding medicines are outlined above.   Tests Ordered: No orders of the defined types were placed in this encounter.   Medication Changes: No orders of the defined types were placed in this encounter.   Follow Up:  In Person in 6 month(s) Dr. [name]  Signed, Ermalinda Barrios, PA-C  11/04/2018 9:50 AM    [place]

## 2018-11-04 ENCOUNTER — Other Ambulatory Visit: Payer: Self-pay

## 2018-11-04 ENCOUNTER — Telehealth (INDEPENDENT_AMBULATORY_CARE_PROVIDER_SITE_OTHER): Payer: Medicare Other | Admitting: Physician Assistant

## 2018-11-04 ENCOUNTER — Encounter: Payer: Self-pay | Admitting: Physician Assistant

## 2018-11-04 VITALS — Ht 68.0 in | Wt 212.0 lb

## 2018-11-04 DIAGNOSIS — N182 Chronic kidney disease, stage 2 (mild): Secondary | ICD-10-CM

## 2018-11-04 DIAGNOSIS — I1310 Hypertensive heart and chronic kidney disease without heart failure, with stage 1 through stage 4 chronic kidney disease, or unspecified chronic kidney disease: Secondary | ICD-10-CM | POA: Diagnosis not present

## 2018-11-04 DIAGNOSIS — I2510 Atherosclerotic heart disease of native coronary artery without angina pectoris: Secondary | ICD-10-CM | POA: Diagnosis not present

## 2018-11-04 DIAGNOSIS — E785 Hyperlipidemia, unspecified: Secondary | ICD-10-CM | POA: Diagnosis not present

## 2018-11-04 DIAGNOSIS — F419 Anxiety disorder, unspecified: Secondary | ICD-10-CM

## 2018-11-04 DIAGNOSIS — I10 Essential (primary) hypertension: Secondary | ICD-10-CM

## 2018-11-04 NOTE — Patient Instructions (Signed)
Medication Instructions:  Your physician recommends that you continue on your current medications as directed. Please refer to the Current Medication list given to you today.  If you need a refill on your cardiac medications before your next appointment, please call your pharmacy.   Lab work: None Ordered  If you have labs (blood work) drawn today and your tests are completely normal, you will receive your results only by: . MyChart Message (if you have MyChart) OR . A paper copy in the mail If you have any lab test that is abnormal or we need to change your treatment, we will call you to review the results.  Testing/Procedures: None ordered  Follow-Up: At [facility], you and your health needs are our priority.  As part of our continuing mission to provide you with exceptional heart care, we have created designated Provider Care Teams.  These Care Teams include your primary Cardiologist (physician) and Advanced Practice Providers (APPs -  Physician Assistants and Nurse Practitioners) who all work together to provide you with the care you need, when you need it. . You will need a follow up appointment in 6 months.  Please call our office 2 months in advance to schedule this appointment.  You may see Katarina Nelson, MD or one of the following Advanced Practice Providers on your designated Care Team:   . Brittainy Simmons, PA-C . Dayna Dunn, PA-C . Michele Lenze, PA-C  Any Other Special Instructions Will Be Listed Below (If Applicable).    

## 2018-11-19 ENCOUNTER — Other Ambulatory Visit: Payer: Self-pay

## 2018-11-19 ENCOUNTER — Ambulatory Visit: Payer: Medicare Other

## 2018-11-19 ENCOUNTER — Encounter: Payer: Self-pay | Admitting: Family Medicine

## 2018-11-19 ENCOUNTER — Ambulatory Visit (INDEPENDENT_AMBULATORY_CARE_PROVIDER_SITE_OTHER): Payer: Medicare Other | Admitting: Family Medicine

## 2018-11-19 DIAGNOSIS — L089 Local infection of the skin and subcutaneous tissue, unspecified: Secondary | ICD-10-CM | POA: Diagnosis not present

## 2018-11-19 DIAGNOSIS — W57XXXA Bitten or stung by nonvenomous insect and other nonvenomous arthropods, initial encounter: Secondary | ICD-10-CM | POA: Insufficient documentation

## 2018-11-19 DIAGNOSIS — S20361A Insect bite (nonvenomous) of right front wall of thorax, initial encounter: Secondary | ICD-10-CM

## 2018-11-19 MED ORDER — TETANUS-DIPHTH-ACELL PERTUSSIS 5-2.5-18.5 LF-MCG/0.5 IM SUSP: 0.5000 mL | Freq: Once | INTRAMUSCULAR | 0 refills | Status: AC

## 2018-11-19 MED ORDER — DOXYCYCLINE HYCLATE 100 MG PO TABS: 200.0000 mg | ORAL_TABLET | Freq: Once | ORAL | 0 refills | Status: AC

## 2018-11-19 NOTE — Assessment & Plan Note (Signed)
Patient has a small localized rash in the area of a previous tick bite.  Patient very concerned about possibility of Lyme disease or other infection.  Although there is low prevalence of Lyme disease in [place], given that patient had tick bite for more than 36 hours, that initially started within 72 hours we decided to give him the prophylactic one-time dose of 200 mg doxycycline.  Also gave patient Tdap prescription for him to get at the pharmacy.  Advised patient to return to clinic if he experience any of the symptoms discussed earlier such as fever, rash, arthralgia.

## 2018-11-19 NOTE — Patient Instructions (Signed)
It was nice to see you today,  I do not think you have Lyme disease from this tick bite.  Lyme disease is still rare in [place], but since the numbers of Lyme's are increasing year to year I am going to prescribe you a one-time dose of prophylactic doxycycline.  Take the 2 pills once and that should prevent you from getting Lyme if this was in fact a take that carried the disease.  We have also prescribed you a tetanus shot, as you is been more than 10 years since her last tetanus shot and tetanus can sometimes be spread through tick bites.  You will need to get this shot at your pharmacy using the printed prescription that I provided for you.  If you start to develop fevers, rash on the rest of your body, joint pain, headache, or other unusual symptoms please call back to the office to schedule another appointment.   Have a great day  Clemetine Marker

## 2018-11-19 NOTE — Progress Notes (Signed)
   [facility] Phone: [phone]   cc: Insect bite, rash  Subjective:  Tick bite: Patient said his wife noticed a tick under his right arm yesterday.  Prior to that he had been having itching there for approximately 2 days, but he thought it was a "mole".  His wife tried to remove the tick with alcohol unsuccessfully.  His daughter then came in attempted and was successful but the tick burst on removal.  They were unable to identify the tick type.  Patient says he has itching in that area and he put some triamcinolone cream on it.  Patient is very concerned about getting Lyme disease and wanted to come to the office to "treat it before got worse."  Patient has not been out in the woods lately, but has been mowing his yard.  He also has a dog that goes out to use the bathroom.  Otherwise no known possible exposure.  He does not have fever, arthralgia, headache, rash other than at the site of infection.  ROS: See HPI for pertinent positives and negatives  Past Medical History  Family history reviewed for today's visit. No changes.  Objective: BP 140/80   Pulse 78   Temp (!) 97.4 F (36.3 C) (Axillary)   SpO2 98%  Gen: NAD, alert and oriented, cooperative with exam CV: normal rate, regular rhythm. No murmurs, no rubs.  Resp: LCTAB, no wheezes, crackles. normal work of breathing GI: nontender to palpation, BS present, no guarding or organomegaly Neuro: No facial palsy Skin: Patient has a small area of erythema surrounding a central eschar where he had a tick bite near the right axilla.  Nontender to palpation, but with some mild induration.  Not warm to touch.  See picture below Psych: Patient appeared very anxious regards to possible diagnosis of Lyme disease.  Assessment/Plan: Tick bite Patient has a small localized rash in the area of a previous tick bite.  Patient very concerned about possibility of Lyme disease or other infection.  Although there is low  prevalence of Lyme disease in [place], given that patient had tick bite for more than 36 hours, that initially started within 72 hours we decided to give him the prophylactic one-time dose of 200 mg doxycycline.  Also gave patient Tdap prescription for him to get at the pharmacy.  Advised patient to return to clinic if he experience any of the symptoms discussed earlier such as fever, rash, arthralgia.  Clemetine Marker, MD PGY-2

## 2019-02-04 ENCOUNTER — Other Ambulatory Visit: Payer: Self-pay | Admitting: Family Medicine

## 2019-04-22 ENCOUNTER — Ambulatory Visit (INDEPENDENT_AMBULATORY_CARE_PROVIDER_SITE_OTHER): Payer: Medicare Other | Admitting: Family Medicine

## 2019-04-22 ENCOUNTER — Other Ambulatory Visit: Payer: Self-pay

## 2019-04-22 VITALS — BP 130/80 | HR 74 | Ht 68.0 in | Wt 208.5 lb

## 2019-04-22 DIAGNOSIS — F411 Generalized anxiety disorder: Secondary | ICD-10-CM | POA: Diagnosis not present

## 2019-04-22 DIAGNOSIS — Z23 Encounter for immunization: Secondary | ICD-10-CM

## 2019-04-22 NOTE — Progress Notes (Signed)
   Subjective:    Patient ID: Lonnie Skinner, male    DOB: 03/04/1961, 58 y.o.   MRN: [id number]   CC: Jury duty form  HPI:  Lonnie Skinner is a 58 year old male with history of generalized anxiety disorder, depression, and cognitive delay presenting to the clinic regarding a call to jury duty.  The patient received notice in October 2020 that he must report for jury duty 04/29/2019.  As the patient is the primary caregiver for his wife who is currently bedbound, he is asking for a supportive note from his physician to have him medically excused from serving jury duty.  Smoking status reviewed -former smoker, quit 2007  Review of Systems: No fevers, headaches, vision changes, chest pain, shortness of breath, nausea, vomiting, abdominal pain, constipation, diarrhea.   Objective:  BP 130/80   Pulse 74   Ht 5\' 8"  (1.727 m)   Wt 208 lb 8 oz (94.6 kg)   SpO2 98%   BMI 31.70 kg/m  Vitals and nursing note reviewed  Physical exam: General: well nourished, no apparent distress Cardiac: RRR, clear S1 and S2, no murmurs appreciated Respiratory: Normal work of breathing Abdomen: soft, nontender, nondistended, no masses or organomegaly. Bowel sounds present Neuro: alert and oriented, no focal deficits  Assessment & Plan:  Medical excused from serving jury duty: With the patient's help, the following was written on his behalf:  "Lonnie Skinner is a patient of mine at the [facility]. I am writing to ask that you excuse him from jury duty for the following reasons:  1. 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  2. Generalized Anxiety Disorder: the patient suffers from and is treated for a generalized anxiety disorder. His anxiety makes it difficult for him to function in a stressful environment, which could include a court room. 3. Mr. Barfield is the sole caregiver and provider for his wife, who is bedbound. He does  not have resources to obtain temporary help in treating and caring for his wife.  4. Additionally, the patient has a history of learning disability and Cognitive Delay since childhood. 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 Even as an adult he continues to have difficulty understanding and comprehending complex information."  Return if symptoms worsen or fail to improve.   Dr. Milus Banister [facility], PGY-2

## 2019-04-23 ENCOUNTER — Encounter: Payer: Self-pay | Admitting: Family Medicine

## 2019-04-25 DIAGNOSIS — Z23 Encounter for immunization: Secondary | ICD-10-CM | POA: Insufficient documentation

## 2019-06-02 ENCOUNTER — Other Ambulatory Visit: Payer: Self-pay | Admitting: *Deleted

## 2019-06-03 MED ORDER — ISOSORBIDE MONONITRATE ER 30 MG PO TB24: ORAL_TABLET | ORAL | 1 refills | Status: DC

## 2019-06-03 MED ORDER — CARVEDILOL 12.5 MG PO TABS: 12.5000 mg | ORAL_TABLET | Freq: Two times a day (BID) | ORAL | 1 refills | Status: DC

## 2019-06-30 NOTE — Progress Notes (Addendum)
Virtual Visit via Telephone Note   This visit type was conducted due to national recommendations for restrictions regarding the COVID-19 Pandemic (e.g. social distancing) in an effort to limit this patient's exposure and mitigate transmission in our community.  Due to his co-morbid illnesses, this patient is at least at moderate risk for complications without adequate follow up.  This format is felt to be most appropriate for this patient at this time.  The patient did not have access to video technology/had technical difficulties with video requiring transitioning to audio format only (telephone).  All issues noted in this document were discussed and addressed.  No physical exam could be performed with this format.  Please refer to the patient's chart for his  consent to telehealth for [facility].   Date:  07/02/2019   ID:  Lonnie Skinner, DOB 1960/08/06, MRN [id number]  Patient Location: Home Provider Location: Home  PCP:  Nuala Alpha, DO  Cardiologist:  Ena Dawley, MD   Electrophysiologist:  None   Evaluation Performed:  Follow-Up Visit  Chief Complaint:   F/u  After 6 months  History of Present Illness:    Lonnie Skinner is a 59 y.o. male with history of CAD status post NSTEMI 2015 treated with DES to the mid RCA with residual LAD disease if fails on medical therapy would proceed with PCI.  Also has history of hypertension, chronic diastolic CHF, HLD, CKD stage II-III, hyperglycemia.  Typically followed at the [place].  Patient is mentally impaired.  07/02/2019 -the patient has been doing well, he is 48 year old mom passed away in 04-29-19, he continues to take care of his wife who needed multiple surgeries last year.  He is very active all day and denies any chest pain or shortness of breath.  He has been compliant with his medications and has no side effects including no myalgias.  He is being followed by his primary care physician where he also gets his blood work done.   He denies any lower extremity edema no orthopnea proximal nocturnal dyspnea.  The patient does not have symptoms concerning for COVID-19 infection (fever, chills, cough, or new shortness of breath).   Past Medical History:  Diagnosis Date  . CAD (coronary artery disease)    a. 05/2013: NSTEMI s/p DES to mRCA (culprit vessel), residual moderate LAD disease (the patient fails medical therapy and this was found to be significant, this would be amenable to PCI).  . CHF (congestive heart failure) ([place])   . CKD (chronic kidney disease), stage II   . DVT (deep venous thrombosis) ([place]) 1981  . Hypercholesteremia   . Hyperglycemia   . Hypertension    Past Surgical History:  Procedure Laterality Date  . CORONARY STENT PLACEMENT  05/2013   mRCA  . CORONARY/GRAFT ACUTE MI REVASCULARIZATION  2015  . KNEE SURGERY Left 2009  . LEFT HEART CATHETERIZATION WITH CORONARY ANGIOGRAM N/A 06/02/2013   Procedure: LEFT HEART CATHETERIZATION WITH CORONARY ANGIOGRAM;  Surgeon: [name], MD;  Location: [facility] CATH LAB;  Service: Cardiovascular;  Laterality: N/A;  . PERCUTANEOUS CORONARY STENT INTERVENTION (PCI-S)  06/02/2013   Procedure: PERCUTANEOUS CORONARY STENT INTERVENTION (PCI-S);  Surgeon: [name], MD;  Location: [facility] CATH LAB;  Service: Cardiovascular;;    Current Meds  Medication Sig  . ALPRAZolam (XANAX) 0.25 MG tablet TAKE 1 TABLET(0.25 MG) BY MOUTH TWICE DAILY AS NEEDED FOR ANXIETY  . aspirin 81 MG EC tablet Take 1 tablet (81 mg total) by mouth daily.  Marland Kitchen  atorvastatin (LIPITOR) 40 MG tablet TAKE 1 TABLET BY MOUTH EVERY EVENING  . carvedilol (COREG) 12.5 MG tablet Take 1 tablet (12.5 mg total) by mouth 2 (two) times daily.  . clopidogrel (PLAVIX) 75 MG tablet Take 1 tablet (75 mg total) by mouth daily.  . clotrimazole-betamethasone (LOTRISONE) cream APPLY TO THE AFFECTED AREA TWICE DAILY  . furosemide (LASIX) 40 MG tablet TAKE 1 TABLET BY MOUTH DAILY  . isosorbide mononitrate (IMDUR) 30  MG 24 hr tablet TAKE 1 TABLET BY MOUTH DAILY, PLEASE SCHEDULE OVERDUE APPOINTMENT FOR FUTURE REFILLS 1ST ATTEMPT  . nitroGLYCERIN (NITROSTAT) 0.4 MG SL tablet Place 1 tablet (0.4 mg total) under the tongue every 5 (five) minutes as needed for chest pain (up to 3 doses).  . triamcinolone cream (KENALOG) 0.1 % APPLY TOPICALLY TO THE AFFECTED AREA TWICE DAILY     Allergies:   Patient has no known allergies.   Social History   Tobacco Use  . Smoking status: Former Research scientist (life sciences)  . Smokeless tobacco: Never Used  . Tobacco comment: Quit ~2007  Substance Use Topics  . Alcohol use: No  . Drug use: No    Family Hx: The patient's family history includes CAD in an other family member; Cancer in his father; Heart attack in his brother and mother; Heart disease in his brother and mother; Heart failure in his mother; Stroke in his mother.  ROS:   Please see the history of present illness.      All other systems reviewed and are negative.  Prior CV studies:   The following studies were reviewed today:   06/18/13  ECHO Study Conclusions  Left ventricle: The cavity size was normal. There was mild focal basal hypertrophy of the septum. Systolic function was normal. The estimated ejection fraction was in the range of 55% to 60%. Wall motion was normal; there were no regional wall motion abnormalities. Features are consistent with a pseudonormal left ventricular filling pattern, with concomitant abnormal relaxation and increased filling pressure (grade 2 diastolic dysfunction).  Impressions: - No prior study for comparison.    Labs/Other Tests and Data Reviewed:    EKG:  No ECG reviewed.  Recent Labs: No results found for requested labs within last 8760 hours.   Recent Lipid Panel Lab Results  Component Value Date/Time   CHOL 106 06/02/2018 10:07 AM   TRIG 91 06/02/2018 10:07 AM   HDL 36 (L) 06/02/2018 10:07 AM   CHOLHDL 2.9 06/02/2018 10:07 AM   CHOLHDL 3.1 02/13/2016 03:40 PM    LDLCALC 52 06/02/2018 10:07 AM   Wt Readings from Last 3 Encounters:  04/22/19 208 lb 8 oz (94.6 kg)  11/04/18 212 lb (96.2 kg)  10/30/18 212 lb 3.2 oz (96.3 kg)    Objective:    Vital Signs:  There were no vitals taken for this visit.   VITAL SIGNS:  reviewed   ASSESSMENT & PLAN:    1. CAD post NSTEMI treated with DES to the mid RCA 2015 with residual moderate LAD disease-on Plavix aspirin, carvedilol and atorvastatin-no angina, no ischemic work-up needed at this point we will continue the same management. 2. Essential hypertension-he does not have a blood pressure cuff we will get him 1. 3. Chronic diastolic CHF on Lasix 40 mg daily-no edema or symptoms, no edema 4. CKD creatinine normal last year we will obtain new labs from his PCP  5. hyperlipidemia atorvastatin is tolerated well.   COVID-19 Education: The signs and symptoms of COVID-19 were discussed with the  patient and how to seek care for testing (follow up with PCP or arrange E-visit).   The importance of social distancing was discussed today.  Time:   Today, I have spent 22 minutes with the patient with telehealth technology discussing the above problems.    Medication Adjustments/Labs and Tests Ordered: Current medicines are reviewed at length with the patient today.  Concerns regarding medicines are outlined above.   Tests Ordered: No orders of the defined types were placed in this encounter.  Medication Changes: No orders of the defined types were placed in this encounter.  Follow Up:  In Person in 6 month(s) Dr. [name]  Signed, Ena Dawley, MD  07/02/2019 11:09 AM    [place]

## 2019-07-02 ENCOUNTER — Telehealth (INDEPENDENT_AMBULATORY_CARE_PROVIDER_SITE_OTHER): Payer: Medicare Other | Admitting: Cardiology

## 2019-07-02 ENCOUNTER — Telehealth: Payer: Self-pay | Admitting: Licensed Clinical Social Worker

## 2019-07-02 ENCOUNTER — Other Ambulatory Visit: Payer: Self-pay

## 2019-07-02 ENCOUNTER — Encounter: Payer: Self-pay | Admitting: Cardiology

## 2019-07-02 DIAGNOSIS — N189 Chronic kidney disease, unspecified: Secondary | ICD-10-CM

## 2019-07-02 DIAGNOSIS — I2510 Atherosclerotic heart disease of native coronary artery without angina pectoris: Secondary | ICD-10-CM | POA: Diagnosis not present

## 2019-07-02 DIAGNOSIS — I5032 Chronic diastolic (congestive) heart failure: Secondary | ICD-10-CM

## 2019-07-02 DIAGNOSIS — I130 Hypertensive heart and chronic kidney disease with heart failure and stage 1 through stage 4 chronic kidney disease, or unspecified chronic kidney disease: Secondary | ICD-10-CM | POA: Diagnosis not present

## 2019-07-02 DIAGNOSIS — Z955 Presence of coronary angioplasty implant and graft: Secondary | ICD-10-CM

## 2019-07-02 DIAGNOSIS — I10 Essential (primary) hypertension: Secondary | ICD-10-CM

## 2019-07-02 DIAGNOSIS — E785 Hyperlipidemia, unspecified: Secondary | ICD-10-CM

## 2019-07-02 DIAGNOSIS — I252 Old myocardial infarction: Secondary | ICD-10-CM | POA: Diagnosis not present

## 2019-07-02 DIAGNOSIS — N182 Chronic kidney disease, stage 2 (mild): Secondary | ICD-10-CM

## 2019-07-02 NOTE — Telephone Encounter (Signed)
CSW referred to assist patient with obtaining a BP cuff. CSW contacted patient to inform cuff will be delivered to home. Patient grateful for support and assistance. CSW available as needed. Jackie Abir Eroh, LCSW, CCSW-MCS [phone]  

## 2019-07-02 NOTE — Patient Instructions (Signed)
Medication Instructions:   Your physician recommends that you continue on your current medications as directed. Please refer to the Current Medication list given to you today.  *If you need a refill on your cardiac medications before your next appointment, please call your pharmacy*    Follow-Up: At [facility], you and your health needs are our priority.  As part of our continuing mission to provide you with exceptional heart care, we have created designated Provider Care Teams.  These Care Teams include your primary Cardiologist (physician) and Advanced Practice Providers (APPs -  Physician Assistants and Nurse Practitioners) who all work together to provide you with the care you need, when you need it.  Your next appointment:   6 month(s)  The format for your next appointment:   In Person  Provider:   Katarina Nelson, MD    

## 2019-09-08 ENCOUNTER — Other Ambulatory Visit: Payer: Self-pay | Admitting: *Deleted

## 2019-09-08 MED ORDER — ALPRAZOLAM 0.25 MG PO TABS: ORAL_TABLET | ORAL | 0 refills | Status: DC

## 2019-10-15 ENCOUNTER — Telehealth: Payer: Self-pay | Admitting: Family Medicine

## 2019-10-15 NOTE — Telephone Encounter (Signed)
Viewed handicapped application and placed in PCP's box for completion.  Lonnie Skinner, [place]

## 2019-10-15 NOTE — Telephone Encounter (Signed)
Pt dropped off a handicap sticker form to be filled out. I have placed this in the Red team folder at the front desk. jw

## 2019-10-22 NOTE — Telephone Encounter (Signed)
Called and informed patient to pick up paperwork for handicapped sticker.  Lonnie Skinner, [place]

## 2019-10-23 ENCOUNTER — Telehealth: Payer: Self-pay | Admitting: Family Medicine

## 2019-10-23 NOTE — Telephone Encounter (Signed)
Attempted to call patient. No answer, unable to leave VM.  If calls back, Dr. Garlan Fillers is out and I am covering.  Please try to find out if he needs to be seen right away, or if he wants to talk to Dr. Garlan Fillers when he gets back in a week.  Thanks

## 2019-10-23 NOTE — Telephone Encounter (Signed)
Pt is requesting doctor to call him to discuss whether he need to come for an appointment  Ph# [phone]

## 2019-10-26 NOTE — Telephone Encounter (Signed)
Attempted to call pt and no answer or voicemail set up. Will try again later. Lonnie Skinner Holter, CMA

## 2019-10-27 NOTE — Telephone Encounter (Signed)
Spoke to patient and all issues are resolved.  Lonnie Skinner, [place]

## 2020-01-19 ENCOUNTER — Other Ambulatory Visit: Payer: Self-pay | Admitting: *Deleted

## 2020-01-19 DIAGNOSIS — I5033 Acute on chronic diastolic (congestive) heart failure: Secondary | ICD-10-CM

## 2020-01-20 MED ORDER — ATORVASTATIN CALCIUM 40 MG PO TABS: 40.0000 mg | ORAL_TABLET | Freq: Every evening | ORAL | 0 refills | Status: DC

## 2020-01-20 MED ORDER — FUROSEMIDE 40 MG PO TABS: 40.0000 mg | ORAL_TABLET | Freq: Every day | ORAL | 0 refills | Status: DC

## 2020-01-20 MED ORDER — CLOPIDOGREL BISULFATE 75 MG PO TABS: 75.0000 mg | ORAL_TABLET | Freq: Every day | ORAL | 0 refills | Status: DC

## 2020-01-20 NOTE — Telephone Encounter (Signed)
Needs appointment

## 2020-01-28 NOTE — Telephone Encounter (Signed)
Contacted pt about this and assisted in getting him an appointment scheduled also mailed a reminder of this appointment to his home.Lonnie Skinner, CMA

## 2020-02-12 ENCOUNTER — Other Ambulatory Visit: Payer: Self-pay | Admitting: *Deleted

## 2020-02-12 NOTE — Telephone Encounter (Signed)
Last Rx from June.  No appointment since Dec.  Low risk for withdrawal given 60 tabs given in June on PMP only.  Needs appointment before Rx can be filled.

## 2020-02-25 ENCOUNTER — Ambulatory Visit: Payer: Medicare Other | Admitting: Family Medicine

## 2020-03-09 ENCOUNTER — Other Ambulatory Visit: Payer: Self-pay

## 2020-03-09 ENCOUNTER — Ambulatory Visit (INDEPENDENT_AMBULATORY_CARE_PROVIDER_SITE_OTHER): Payer: Medicare Other | Admitting: Family Medicine

## 2020-03-09 ENCOUNTER — Encounter: Payer: Self-pay | Admitting: Family Medicine

## 2020-03-09 VITALS — BP 110/70 | HR 71 | Ht 68.0 in | Wt 205.2 lb

## 2020-03-09 DIAGNOSIS — I10 Essential (primary) hypertension: Secondary | ICD-10-CM | POA: Diagnosis not present

## 2020-03-09 DIAGNOSIS — I25119 Atherosclerotic heart disease of native coronary artery with unspecified angina pectoris: Secondary | ICD-10-CM

## 2020-03-09 DIAGNOSIS — Z1211 Encounter for screening for malignant neoplasm of colon: Secondary | ICD-10-CM

## 2020-03-09 DIAGNOSIS — F411 Generalized anxiety disorder: Secondary | ICD-10-CM

## 2020-03-09 DIAGNOSIS — Z23 Encounter for immunization: Secondary | ICD-10-CM

## 2020-03-09 MED ORDER — ALPRAZOLAM 0.25 MG PO TABS: ORAL_TABLET | ORAL | 0 refills | Status: DC

## 2020-03-09 NOTE — Progress Notes (Addendum)
    SUBJECTIVE:   CHIEF COMPLAINT / HPI:   Hypertension Current regimen: Carvedilol 12.5 mg twice daily, Lasix 40 mg daily, Imdur BP at home, doesn't check Denies any chest pain, shortness of breath, leg swelling Last BMP was on June 02, 2018, were within normal limits at that time Last lipid panel was also at this time, LDL was 52  CAD, chronic diastolic CHF NSTEMI in 2426 treated with DES to mid RCA with residual LAD Last visit with Dr. [name] was on 07/02/2019, no changes were made at that time He was advised to follow-up in 6 months visit not yet done so Currently on Lipitor 40 mg daily, last lipid panel Also on Plavix, baby aspirin, Imdur, nitro as needed, beta-blocker per above  Anxiety Patient reports that he has significant anxiety and has a history of using Xanax sparingly He reports that he does not want to try other medications that he wants to stay on his current medication as this has been working well for him He is very concerned that this medication will not be refilled for him  Patient reports that he had a colonoscopy over 10 years ago in the [place] system. Patient is due for colonoscopy.   PERTINENT  PMH / PSH: HTN, NSTEMI with CAD and stent, chronic diastolic CHF, HLD, GAD  OBJECTIVE:   BP 110/70   Pulse 71   Ht 5\' 8"  (1.727 m)   Wt 205 lb 4 oz (93.1 kg)   SpO2 99%   BMI 31.21 kg/m    Physical Exam:  General: 59 y.o. male in NAD Cardio: RRR Lungs: CTAB, no wheezing, no rhonchi, no crackles, no IWOB on RA Skin: warm and dry Extremities: No edema, chronic stasis dermatitis with hyperpigmentation bilateral lower extremities   ASSESSMENT/PLAN:   Essential hypertension, benign BP today is well controlled.  We will continue with his current regimen.  He follows with cardiology, advised him to follow-up with them as soon as possible as he was due for follow-up in August.  No changes to his regimen today.  We will obtain a BMP as he has not had one in over  a year.  CAD (coronary artery disease) He is currently on appropriate therapy.  He should continue on these medications.  Follows with Dr. [name], he was supposed to follow-up in August, advised him to make an appointment for this is soon as possible.  We will obtain a lipid panel today.  Generalized anxiety disorder PMP reviewed, no red flags.  Patient is insistent that he stay on his Xanax.  He would most likely benefit from controller therapy for anxiety, but does use it very sparingly.  For this reason, okay to go ahead and refill.  He was advised that he needs to be seen every 3 months if this will be continued.  Of note, patient does have intellectual disability and this may be contributing to having these discussions.   Patient was educated on the importance of COVID vaccine, he is concerned that he will not feel well at this.  He wants to proceed with the flu shot today, will come back if he decides to have a Covid vaccination.  He was given information about scheduling a colonoscopy and advised to do so.  Lonnie Skinner, [place]

## 2020-03-09 NOTE — Assessment & Plan Note (Signed)
" >>  ASSESSMENT AND PLAN FOR ESSENTIAL HYPERTENSION, BENIGN WRITTEN ON 03/09/2020  4:31 PM BY Khanh Cordner J, DO  BP today is well controlled.  We will continue with his current regimen.  He follows with cardiology, advised him to follow-up with them as soon as possible as he was due for follow-up in August.  No changes to his regimen today.  We will obtain a BMP as he has not had one in over a year. "

## 2020-03-09 NOTE — Patient Instructions (Addendum)
Thank you for coming to see me today. It was a pleasure. Today we talked about:   We will get some labs today.  If they are abnormal or we need to do something about them, I will call you.  If they are normal, I will send you a message on MyChart (if it is active) or a letter in the mail.  If you don't hear from [place] in 2 weeks, please call the office at the number below.  You are due for a colonoscopy.  Please use the form that we have given you to schedule this at your convenience.   Please follow-up with me in 6 months.  If you have any questions or concerns, please do not hesitate to call the office at [phone].  Best,   [place] Constable, DO

## 2020-03-09 NOTE — Assessment & Plan Note (Signed)
BP today is well controlled.  We will continue with his current regimen.  He follows with cardiology, advised him to follow-up with them as soon as possible as he was due for follow-up in August.  No changes to his regimen today.  We will obtain a BMP as he has not had one in over a year.

## 2020-03-09 NOTE — Assessment & Plan Note (Signed)
He is currently on appropriate therapy.  He should continue on these medications.  Follows with Dr. [name], he was supposed to follow-up in August, advised him to make an appointment for this is soon as possible.  We will obtain a lipid panel today.

## 2020-03-09 NOTE — Assessment & Plan Note (Addendum)
PMP reviewed, no red flags.  Patient is insistent that he stay on his Xanax.  He would most likely benefit from controller therapy for anxiety, but does use it very sparingly.  For this reason, okay to go ahead and refill.  He was advised that he needs to be seen every 3 months if this will be continued.  Of note, patient does have intellectual disability and this may be contributing to having these discussions.

## 2020-03-10 ENCOUNTER — Encounter: Payer: Self-pay | Admitting: Family Medicine

## 2020-03-10 LAB — LIPID PANEL
Chol/HDL Ratio: 2.6 ratio (ref 0.0–5.0)
Cholesterol, Total: 102 mg/dL (ref 100–199)
HDL: 39 mg/dL — ABNORMAL LOW (ref >39)
LDL Chol Calc (NIH): 45 mg/dL (ref 0–99)
Triglycerides: 90 mg/dL (ref 0–149)
VLDL Cholesterol Cal: 18 mg/dL (ref 5–40)

## 2020-03-10 LAB — BASIC METABOLIC PANEL
BUN/Creatinine Ratio: 12 (ref 9–20)
BUN: 17 mg/dL (ref 6–24)
CO2: 28 mmol/L (ref 20–29)
Calcium: 9.4 mg/dL (ref 8.7–10.2)
Chloride: 102 mmol/L (ref 96–106)
Creatinine, Ser: 1.41 mg/dL — ABNORMAL HIGH (ref 0.76–1.27)
GFR calc Af Amer: 63 mL/min/{1.73_m2} (ref >59)
GFR calc non Af Amer: 54 mL/min/{1.73_m2} — ABNORMAL LOW (ref >59)
Glucose: 84 mg/dL (ref 65–99)
Potassium: 4.3 mmol/L (ref 3.5–5.2)
Sodium: 141 mmol/L (ref 134–144)

## 2020-04-18 ENCOUNTER — Encounter: Payer: Self-pay | Admitting: Gastroenterology

## 2020-05-04 ENCOUNTER — Ambulatory Visit: Payer: Medicare Other | Admitting: Gastroenterology

## 2020-05-30 ENCOUNTER — Ambulatory Visit: Payer: Medicare Other | Admitting: Gastroenterology

## 2020-06-09 ENCOUNTER — Telehealth: Payer: Self-pay

## 2020-06-09 NOTE — Telephone Encounter (Signed)
Patient calls nurse line reporting feeling unwell. Patient could not give me a specific symptom, "just feel bad." Patient denied fever, chills, cough, body aches, chest pain, SOB, fatigue or NVD. Patient was speaking normally and is home with is wife. Patient did report he called the ambulance and they just left. Patient stated they told him to stay hydrated today and that he did not need to go to ED and to call his PCP. I offered him an apt for this am, however he declined and said it was too cold. Reiterated fluids and to rest and to call if he needs an apt. Patient verbalized understanding.

## 2020-06-09 NOTE — Telephone Encounter (Signed)
Agree with below.  Patient should be seen if feeling unwell.

## 2020-06-14 ENCOUNTER — Emergency Department (HOSPITAL_COMMUNITY): Payer: Medicare Other

## 2020-06-14 ENCOUNTER — Emergency Department (HOSPITAL_COMMUNITY)
Admission: EM | Admit: 2020-06-14 | Discharge: 2020-06-15 | Disposition: A | Discharge status: Home / Self Care | Payer: Medicare Other | Attending: Emergency Medicine | Admitting: Emergency Medicine

## 2020-06-14 DIAGNOSIS — R5381 Other malaise: Secondary | ICD-10-CM | POA: Diagnosis not present

## 2020-06-14 DIAGNOSIS — I10 Essential (primary) hypertension: Secondary | ICD-10-CM | POA: Insufficient documentation

## 2020-06-14 DIAGNOSIS — R410 Disorientation, unspecified: Secondary | ICD-10-CM | POA: Diagnosis not present

## 2020-06-14 DIAGNOSIS — R059 Cough, unspecified: Secondary | ICD-10-CM | POA: Diagnosis not present

## 2020-06-14 DIAGNOSIS — U071 COVID-19: Secondary | ICD-10-CM | POA: Diagnosis not present

## 2020-06-14 DIAGNOSIS — Z20822 Contact with and (suspected) exposure to covid-19: Secondary | ICD-10-CM

## 2020-06-14 DIAGNOSIS — R69 Illness, unspecified: Secondary | ICD-10-CM | POA: Diagnosis not present

## 2020-06-14 DIAGNOSIS — J029 Acute pharyngitis, unspecified: Secondary | ICD-10-CM | POA: Diagnosis present

## 2020-06-14 LAB — COMPREHENSIVE METABOLIC PANEL
ALT: 42 U/L (ref 0–44)
AST: 45 U/L — ABNORMAL HIGH (ref 15–41)
Albumin: 3.6 g/dL (ref 3.5–5.0)
Alkaline Phosphatase: 81 U/L (ref 38–126)
Anion gap: 13 (ref 5–15)
CO2: 24 mmol/L (ref 22–32)
Calcium: 9 mg/dL (ref 8.9–10.3)
Chloride: 102 mmol/L (ref 98–111)
Creatinine, Ser: 1.17 mg/dL (ref 0.61–1.24)
GFR, Estimated: >60 mL/min (ref >60)
Potassium: 3.6 mmol/L (ref 3.5–5.1)
Sodium: 139 mmol/L (ref 135–145)
Total Bilirubin: 1.5 mg/dL — ABNORMAL HIGH (ref 0.3–1.2)
Total Protein: 7.2 g/dL (ref 6.5–8.1)

## 2020-06-14 LAB — URINALYSIS, ROUTINE W REFLEX MICROSCOPIC
Bilirubin Urine: NEGATIVE
Glucose, UA: NEGATIVE mg/dL
Hgb urine dipstick: NEGATIVE
Ketones, ur: 80 mg/dL — AB
Leukocytes,Ua: NEGATIVE
Nitrite: NEGATIVE
Protein, ur: 30 mg/dL — AB
Specific Gravity, Urine: 1.03 (ref 1.005–1.030)
pH: 5 (ref 5.0–8.0)

## 2020-06-14 LAB — CBC WITH DIFFERENTIAL/PLATELET
Basophils Absolute: 0 10*3/uL (ref 0.0–0.1)
Basophils Relative: 0 %
Eosinophils Relative: 0 %
HCT: 46.6 % (ref 39.0–52.0)
Hemoglobin: 16.6 g/dL (ref 13.0–17.0)
Immature Granulocytes: 0 %
Lymphs Abs: 1.1 10*3/uL (ref 0.7–4.0)
MCH: 31.4 pg (ref 26.0–34.0)
MCHC: 35.6 g/dL (ref 30.0–36.0)
Monocytes Absolute: 0.5 10*3/uL (ref 0.1–1.0)
Monocytes Relative: 13 %
Neutro Abs: 2.1 10*3/uL (ref 1.7–7.7)
Neutrophils Relative %: 57 %
Platelets: 157 10*3/uL (ref 150–400)
RBC: 5.29 MIL/uL (ref 4.22–5.81)
RDW: 12.2 % (ref 11.5–15.5)
WBC: 3.7 10*3/uL — ABNORMAL LOW (ref 4.0–10.5)
nRBC: 0 % (ref 0.0–0.2)

## 2020-06-14 MED ORDER — ONDANSETRON HCL 4 MG/2ML IJ SOLN
4.0000 mg | Freq: Once | INTRAMUSCULAR | Status: AC
  Administered 2020-06-15: 4 mg via INTRAVENOUS
  Filled 2020-06-14: qty 2

## 2020-06-14 MED ORDER — SODIUM CHLORIDE 0.9 % IV BOLUS
500.0000 mL | Freq: Once | INTRAVENOUS | Status: AC
  Administered 2020-06-15: 500 mL via INTRAVENOUS

## 2020-06-14 NOTE — ED Triage Notes (Signed)
Pt arrives from home via [facility] for c/o "feeling sick" states he has had a sore throat, diarrhea and cough for the past few days. Ems reports wife is sick as well. Pt poor historian.

## 2020-06-14 NOTE — ED Notes (Signed)
Pt complaint of numbness on his right hand and chest pain

## 2020-06-14 NOTE — ED Provider Notes (Addendum)
[facility] EMERGENCY DEPARTMENT Provider Note   CSN: [id number] Arrival date & time: 06/14/20  1208     History Chief Complaint  Patient presents with   Sore Throat   Cough    Lonnie Skinner is a 60 y.o. male.  The history is provided by the patient.  Sore Throat This is a new problem. The current episode started more than 2 days ago. The problem occurs constantly. The problem has not changed since onset.Pertinent negatives include no chest pain, no abdominal pain, no headaches and no shortness of breath. Associated symptoms comments: Cough, no appetite . Nothing aggravates the symptoms. Nothing relieves the symptoms. He has tried nothing for the symptoms. The treatment provided no relief.  Cough Cough characteristics:  Non-productive Severity:  Moderate Onset quality:  Gradual Duration:  3 days Timing:  Intermittent Progression:  Unchanged Chronicity:  New Context: sick contacts and upper respiratory infection   Context comment:  Wife with same, they are unvaccinated  Relieved by:  Nothing Worsened by:  Nothing Ineffective treatments:  None tried Associated symptoms: no chest pain, no fever, no headaches and no shortness of breath   Risk factors: no chemical exposure   Patient here with covid symptoms for 3 days, wife with same both unvaccinated.  Wife is currently being admitted with a positive covid test.  Stated he has no appetite      Past Medical History:  Diagnosis Date   Hypertension    MI (myocardial infarction) ([place])     There are no problems to display for this patient.   History reviewed. No pertinent surgical history.      History reviewed. No pertinent family history.     Home Medications Prior to Admission medications   Not on File    Allergies    Patient has no known allergies.  Review of Systems   Review of Systems  Constitutional: Negative for fever.  HENT: Negative for congestion.   Eyes: Negative for visual  disturbance.  Respiratory: Positive for cough. Negative for shortness of breath.   Cardiovascular: Negative for chest pain.  Gastrointestinal: Negative for abdominal pain.  Genitourinary: Negative for difficulty urinating.  Musculoskeletal: Negative for arthralgias.  Skin: Negative for color change.  Neurological: Negative for headaches.  Psychiatric/Behavioral: Negative for agitation.  All other systems reviewed and are negative.   Physical Exam Updated Vital Signs BP 115/76    Pulse 83    Temp 98.7 F (37.1 C) (Oral)    Resp 18    SpO2 98%   Physical Exam Vitals and nursing note reviewed.  Constitutional:      General: He is not in acute distress.    Appearance: Normal appearance.  HENT:     Head: Normocephalic and atraumatic.     Nose: Nose normal.  Eyes:     Conjunctiva/sclera: Conjunctivae normal.     Pupils: Pupils are equal, round, and reactive to light.  Cardiovascular:     Rate and Rhythm: Normal rate and regular rhythm.     Pulses: Normal pulses.     Heart sounds: Normal heart sounds.  Pulmonary:     Effort: Pulmonary effort is normal.     Breath sounds: Normal breath sounds.  Abdominal:     General: Abdomen is flat. Bowel sounds are normal.     Palpations: Abdomen is soft.     Tenderness: There is no abdominal tenderness. There is no guarding.  Musculoskeletal:        General: Normal  range of motion.     Cervical back: Normal range of motion and neck supple. No rigidity.  Skin:    General: Skin is warm and dry.     Capillary Refill: Capillary refill takes less than 2 seconds.  Neurological:     General: No focal deficit present.     Mental Status: He is alert and oriented to person, place, and time.     Deep Tendon Reflexes: Reflexes normal.  Psychiatric:        Mood and Affect: Mood normal.        Behavior: Behavior normal.     ED Results / Procedures / Treatments   Labs (all labs ordered are listed, but only abnormal results are  displayed) Results for orders placed or performed during the hospital encounter of 06/14/20  Comprehensive metabolic panel  Result Value Ref Range   Sodium 139 135 - 145 mmol/L   Potassium 3.6 3.5 - 5.1 mmol/L   Chloride 102 98 - 111 mmol/L   CO2 24 22 - 32 mmol/L   Glucose, Bld 102 (H) 70 - 99 mg/dL   BUN 17 6 - 20 mg/dL   Creatinine, Ser 1.17 0.61 - 1.24 mg/dL   Calcium 9.0 8.9 - 10.3 mg/dL   Total Protein 7.2 6.5 - 8.1 g/dL   Albumin 3.6 3.5 - 5.0 g/dL   AST 45 (H) 15 - 41 U/L   ALT 42 0 - 44 U/L   Alkaline Phosphatase 81 38 - 126 U/L   Total Bilirubin 1.5 (H) 0.3 - 1.2 mg/dL   GFR, Estimated >60 >60 mL/min   Anion gap 13 5 - 15  CBC with Differential  Result Value Ref Range   WBC 3.7 (L) 4.0 - 10.5 K/uL   RBC 5.29 4.22 - 5.81 MIL/uL   Hemoglobin 16.6 13.0 - 17.0 g/dL   HCT 46.6 39.0 - 52.0 %   MCV 88.1 80.0 - 100.0 fL   MCH 31.4 26.0 - 34.0 pg   MCHC 35.6 30.0 - 36.0 g/dL   RDW 12.2 11.5 - 15.5 %   Platelets 157 150 - 400 K/uL   nRBC 0.0 0.0 - 0.2 %   Neutrophils Relative % 57 %   Neutro Abs 2.1 1.7 - 7.7 K/uL   Lymphocytes Relative 30 %   Lymphs Abs 1.1 0.7 - 4.0 K/uL   Monocytes Relative 13 %   Monocytes Absolute 0.5 0.1 - 1.0 K/uL   Eosinophils Relative 0 %   Eosinophils Absolute 0.0 0.0 - 0.5 K/uL   Basophils Relative 0 %   Basophils Absolute 0.0 0.0 - 0.1 K/uL   Immature Granulocytes 0 %   Abs Immature Granulocytes 0.01 0.00 - 0.07 K/uL  Urinalysis, Routine w reflex microscopic Urine, Clean Catch  Result Value Ref Range   Color, Urine AMBER (A) YELLOW   APPearance CLEAR CLEAR   Specific Gravity, Urine 1.030 1.005 - 1.030   pH 5.0 5.0 - 8.0   Glucose, UA NEGATIVE NEGATIVE mg/dL   Hgb urine dipstick NEGATIVE NEGATIVE   Bilirubin Urine NEGATIVE NEGATIVE   Ketones, ur 80 (A) NEGATIVE mg/dL   Protein, ur 30 (A) NEGATIVE mg/dL   Nitrite NEGATIVE NEGATIVE   Leukocytes,Ua NEGATIVE NEGATIVE   RBC / HPF 0-5 0 - 5 RBC/hpf   WBC, UA 0-5 0 - 5 WBC/hpf    Bacteria, UA RARE (A) NONE SEEN   Mucus PRESENT    DG Chest Portable 1 View  Result Date: 06/14/2020 CLINICAL DATA:  Cough.  Chills. EXAM: PORTABLE CHEST 1 VIEW COMPARISON:  No prior. FINDINGS: Mediastinum hilar structures normal. Heart size normal. Lung volumes. Mild left base infiltrate cannot be excluded. No pleural effusion or pneumothorax. Heart size normal. Degenerative change thoracic spine. IMPRESSION: Low lung volumes. Mild left base infiltrate cannot be excluded. Electronically Signed   By: Marcello Moores  Register   On: 06/14/2020 12:50    Radiology DG Chest Portable 1 View  Result Date: 06/14/2020 CLINICAL DATA:  Cough.  Chills. EXAM: PORTABLE CHEST 1 VIEW COMPARISON:  No prior. FINDINGS: Mediastinum hilar structures normal. Heart size normal. Lung volumes. Mild left base infiltrate cannot be excluded. No pleural effusion or pneumothorax. Heart size normal. Degenerative change thoracic spine. IMPRESSION: Low lung volumes. Mild left base infiltrate cannot be excluded. Electronically Signed   By: Marcello Moores  Register   On: 06/14/2020 12:50    Procedures Procedures   Medications Ordered in ED Medications  ondansetron (ZOFRAN) injection 4 mg (has no administration in time range)  sodium chloride 0.9 % bolus 500 mL (has no administration in time range)    ED Course  I have reviewed the triage vital signs and the nursing notes.  Pertinent labs & imaging results that were available during my care of the patient were reviewed by me and considered in my medical decision making (see chart for details).   Patient has covid symptoms.  Given nausea medication and PO challenged.  Well appearing and vital signs are normal.  There is no indication for admission at this time.  Strict return precautions given.      Lonnie Skinner was evaluated in Emergency Department on 06/14/2020 for the symptoms described in the history of present illness. He was evaluated in the context of the global COVID-19 pandemic,  which necessitated consideration that the patient might be at risk for infection with the SARS-CoV-2 virus that causes COVID-19. Institutional protocols and algorithms that pertain to the evaluation of patients at risk for COVID-19 are in a state of rapid change based on information released by regulatory bodies including the CDC and federal and state organizations. These policies and algorithms were followed during the patient's care in the ED.  Final Clinical Impression(s) / ED Diagnoses Return for intractable cough, coughing up blood, fevers > 100.4 unrelieved by medication, shortness of breath, intractable vomiting, chest pain, shortness of breath, weakness, numbness, changes in speech, facial asymmetry, abdominal pain, passing out, Inability to tolerate liquids or food, cough, altered mental status or any concerns. No signs of systemic illness or infection. The patient is nontoxic-appearing on exam and vital signs are within normal limits.  I have reviewed the triage vital signs and the nursing notes. Pertinent labs & imaging results that were available during my care of the patient were reviewed by me and considered in my medical decision making (see chart for details). After history, exam, and medical workup I feel the patient has been appropriately medically screened and is safe for discharge home. Pertinent diagnoses were discussed with the patient. Patient was given return precautions.      Ariyanah Aguado, MD 06/14/20 [place], Laramie Meissner, MD 06/15/20 [id number]    Veatrice Kells, MD 06/15/20 [id number]

## 2020-06-15 LAB — SARS CORONAVIRUS 2 (TAT 6-24 HRS): SARS Coronavirus 2: POSITIVE — AB

## 2020-06-15 MED ORDER — SODIUM CHLORIDE 0.9 % IV BOLUS
500.0000 mL | Freq: Once | INTRAVENOUS | Status: AC
  Administered 2020-06-15: 500 mL via INTRAVENOUS

## 2020-06-15 NOTE — ED Notes (Signed)
Patient verbalizes understanding of discharge instructions. Opportunity for questioning and answers were provided. Armband removed by staff, pt discharged from ED via wheelchair. Pt was ambulatory from bed to wheelchair. Pt was provided with bus ticket and placed in the lobby to wait for the bus to run in the morning.

## 2020-06-15 NOTE — Discharge Instructions (Signed)
Person Under Monitoring Name: Lonnie Skinner  Location: [place] [address]   Infection Prevention Recommendations for Individuals Confirmed to have, or Being Evaluated for, 2019 Novel Coronavirus (COVID-19) Infection Who Receive Care at Home  Individuals who are confirmed to have, or are being evaluated for, COVID-19 should follow the prevention steps below until a healthcare provider or local or state health department says they can return to normal activities.  Stay home except to get medical care You should restrict activities outside your home, except for getting medical care. Do not go to work, school, or public areas, and do not use public transportation or taxis.  Call ahead before visiting your doctor Before your medical appointment, call the healthcare provider and tell them that you have, or are being evaluated for, COVID-19 infection. This will help the healthcare providers office take steps to keep other people from getting infected. Ask your healthcare provider to call the local or state health department.  Monitor your symptoms Seek prompt medical attention if your illness is worsening (e.g., difficulty breathing). Before going to your medical appointment, call the healthcare provider and tell them that you have, or are being evaluated for, COVID-19 infection. Ask your healthcare provider to call the local or state health department.  Wear a facemask You should wear a facemask that covers your nose and mouth when you are in the same room with other people and when you visit a healthcare provider. People who live with or visit you should also wear a facemask while they are in the same room with you.  Separate yourself from other people in your home As much as possible, you should stay in a different room from other people in your home. Also, you should use a separate bathroom, if available.  Avoid sharing household items You should not  share dishes, drinking glasses, cups, eating utensils, towels, bedding, or other items with other people in your home. After using these items, you should wash them thoroughly with soap and water.  Cover your coughs and sneezes Cover your mouth and nose with a tissue when you cough or sneeze, or you can cough or sneeze into your sleeve. Throw used tissues in a lined trash can, and immediately wash your hands with soap and water for at least 20 seconds or use an alcohol-based hand rub.  Wash your [facility] your hands often and thoroughly with soap and water for at least 20 seconds. You can use an alcohol-based hand sanitizer if soap and water are not available and if your hands are not visibly dirty. Avoid touching your eyes, nose, and mouth with unwashed hands.   Prevention Steps for Caregivers and Household Members of Individuals Confirmed to have, or Being Evaluated for, COVID-19 Infection Being Cared for in the Home  If you live with, or provide care at home for, a person confirmed to have, or being evaluated for, COVID-19 infection please follow these guidelines to prevent infection:  Follow healthcare providers instructions Make sure that you understand and can help the patient follow any healthcare provider instructions for all care.  Provide for the patients basic needs You should help the patient with basic needs in the home and provide support for getting groceries, prescriptions, and other personal needs.  Monitor the patients symptoms If they are getting sicker, call his or her medical provider and tell them that the patient has, or is being evaluated for, COVID-19 infection. This will help the healthcare providers office  take steps to keep other people from getting infected. Ask the healthcare provider to call the local or state health department.  Limit the number of people who have contact with the patient If possible, have only one caregiver for the  patient. Other household members should stay in another home or place of residence. If this is not possible, they should stay in another room, or be separated from the patient as much as possible. Use a separate bathroom, if available. Restrict visitors who do not have an essential need to be in the home.  Keep older adults, very young children, and other sick people away from the patient Keep older adults, very young children, and those who have compromised immune systems or chronic health conditions away from the patient. This includes people with chronic heart, lung, or kidney conditions, diabetes, and cancer.  Ensure good ventilation Make sure that shared spaces in the home have good air flow, such as from an air conditioner or an opened window, weather permitting.  Wash your hands often Wash your hands often and thoroughly with soap and water for at least 20 seconds. You can use an alcohol based hand sanitizer if soap and water are not available and if your hands are not visibly dirty. Avoid touching your eyes, nose, and mouth with unwashed hands. Use disposable paper towels to dry your hands. If not available, use dedicated cloth towels and replace them when they become wet.  Wear a facemask and gloves Wear a disposable facemask at all times in the room and gloves when you touch or have contact with the patients blood, body fluids, and/or secretions or excretions, such as sweat, saliva, sputum, nasal mucus, vomit, urine, or feces.  Ensure the mask fits over your nose and mouth tightly, and do not touch it during use. Throw out disposable facemasks and gloves after using them. Do not reuse. Wash your hands immediately after removing your facemask and gloves. If your personal clothing becomes contaminated, carefully remove clothing and launder. Wash your hands after handling contaminated clothing. Place all used disposable facemasks, gloves, and other waste in a lined container before  disposing them with other household waste. Remove gloves and wash your hands immediately after handling these items.  Do not share dishes, glasses, or other household items with the patient Avoid sharing household items. You should not share dishes, drinking glasses, cups, eating utensils, towels, bedding, or other items with a patient who is confirmed to have, or being evaluated for, COVID-19 infection. After the person uses these items, you should wash them thoroughly with soap and water.  Wash laundry thoroughly Immediately remove and wash clothes or bedding that have blood, body fluids, and/or secretions or excretions, such as sweat, saliva, sputum, nasal mucus, vomit, urine, or feces, on them. Wear gloves when handling laundry from the patient. Read and follow directions on labels of laundry or clothing items and detergent. In general, wash and dry with the warmest temperatures recommended on the label.  Clean all areas the individual has used often Clean all touchable surfaces, such as counters, tabletops, doorknobs, bathroom fixtures, toilets, phones, keyboards, tablets, and bedside tables, every day. Also, clean any surfaces that may have blood, body fluids, and/or secretions or excretions on them. Wear gloves when cleaning surfaces the patient has come in contact with. Use a diluted bleach solution (e.g., dilute bleach with 1 part bleach and 10 parts water) or a household disinfectant with a label that says EPA-registered for coronaviruses. To make a bleach  solution at home, add 1 tablespoon of bleach to 1 quart (4 cups) of water. For a larger supply, add  cup of bleach to 1 gallon (16 cups) of water. Read labels of cleaning products and follow recommendations provided on product labels. Labels contain instructions for safe and effective use of the cleaning product including precautions you should take when applying the product, such as wearing gloves or eye protection and making sure you  have good ventilation during use of the product. Remove gloves and wash hands immediately after cleaning.  Monitor yourself for signs and symptoms of illness Caregivers and household members are considered close contacts, should monitor their health, and will be asked to limit movement outside of the home to the extent possible. Follow the monitoring steps for close contacts listed on the symptom monitoring form.   ? If you have additional questions, contact your local health department or call the epidemiologist on call at [phone] (available 24/7). ? This guidance is subject to change. For the most up-to-date guidance from [facility], please refer to their website: [email or web address]

## 2020-06-15 NOTE — ED Notes (Signed)
Pt tolerate fluids well   

## 2020-06-16 ENCOUNTER — Other Ambulatory Visit: Payer: Self-pay

## 2020-06-16 ENCOUNTER — Emergency Department (HOSPITAL_COMMUNITY): Payer: Medicare Other

## 2020-06-16 ENCOUNTER — Emergency Department (HOSPITAL_COMMUNITY)
Admission: EM | Admit: 2020-06-16 | Discharge: 2020-06-17 | Disposition: A | Discharge status: Home / Self Care | Payer: Medicare Other | Attending: Emergency Medicine | Admitting: Emergency Medicine

## 2020-06-16 ENCOUNTER — Emergency Department (HOSPITAL_COMMUNITY)
Admission: EM | Admit: 2020-06-16 | Discharge: 2020-06-16 | Disposition: A | Discharge status: Home / Self Care | Payer: Medicare Other | Source: Home / Self Care | Attending: Emergency Medicine | Admitting: Emergency Medicine

## 2020-06-16 DIAGNOSIS — R55 Syncope and collapse: Secondary | ICD-10-CM | POA: Insufficient documentation

## 2020-06-16 DIAGNOSIS — R4182 Altered mental status, unspecified: Secondary | ICD-10-CM | POA: Insufficient documentation

## 2020-06-16 DIAGNOSIS — Z7982 Long term (current) use of aspirin: Secondary | ICD-10-CM | POA: Diagnosis not present

## 2020-06-16 DIAGNOSIS — R4020 Unspecified coma: Secondary | ICD-10-CM | POA: Diagnosis not present

## 2020-06-16 DIAGNOSIS — Z955 Presence of coronary angioplasty implant and graft: Secondary | ICD-10-CM | POA: Insufficient documentation

## 2020-06-16 DIAGNOSIS — N182 Chronic kidney disease, stage 2 (mild): Secondary | ICD-10-CM | POA: Insufficient documentation

## 2020-06-16 DIAGNOSIS — U071 COVID-19: Secondary | ICD-10-CM

## 2020-06-16 DIAGNOSIS — Z5321 Procedure and treatment not carried out due to patient leaving prior to being seen by health care provider: Secondary | ICD-10-CM | POA: Insufficient documentation

## 2020-06-16 DIAGNOSIS — I130 Hypertensive heart and chronic kidney disease with heart failure and stage 1 through stage 4 chronic kidney disease, or unspecified chronic kidney disease: Secondary | ICD-10-CM | POA: Insufficient documentation

## 2020-06-16 DIAGNOSIS — G9340 Encephalopathy, unspecified: Secondary | ICD-10-CM | POA: Diagnosis not present

## 2020-06-16 DIAGNOSIS — R410 Disorientation, unspecified: Secondary | ICD-10-CM | POA: Diagnosis not present

## 2020-06-16 DIAGNOSIS — Z79899 Other long term (current) drug therapy: Secondary | ICD-10-CM | POA: Diagnosis not present

## 2020-06-16 DIAGNOSIS — I5033 Acute on chronic diastolic (congestive) heart failure: Secondary | ICD-10-CM | POA: Diagnosis not present

## 2020-06-16 DIAGNOSIS — R0602 Shortness of breath: Secondary | ICD-10-CM | POA: Insufficient documentation

## 2020-06-16 DIAGNOSIS — R52 Pain, unspecified: Secondary | ICD-10-CM | POA: Diagnosis not present

## 2020-06-16 DIAGNOSIS — I2510 Atherosclerotic heart disease of native coronary artery without angina pectoris: Secondary | ICD-10-CM | POA: Diagnosis not present

## 2020-06-16 DIAGNOSIS — R404 Transient alteration of awareness: Secondary | ICD-10-CM | POA: Diagnosis not present

## 2020-06-16 DIAGNOSIS — I10 Essential (primary) hypertension: Secondary | ICD-10-CM | POA: Diagnosis not present

## 2020-06-16 DIAGNOSIS — Z87891 Personal history of nicotine dependence: Secondary | ICD-10-CM | POA: Insufficient documentation

## 2020-06-16 LAB — ETHANOL: Alcohol, Ethyl (B): <10 mg/dL (ref <10)

## 2020-06-16 LAB — COMPREHENSIVE METABOLIC PANEL
ALT: 41 U/L (ref 0–44)
AST: 43 U/L — ABNORMAL HIGH (ref 15–41)
Albumin: 3.2 g/dL — ABNORMAL LOW (ref 3.5–5.0)
Alkaline Phosphatase: 68 U/L (ref 38–126)
Anion gap: 10 (ref 5–15)
BUN: 13 mg/dL (ref 6–20)
CO2: 28 mmol/L (ref 22–32)
Calcium: 8.8 mg/dL — ABNORMAL LOW (ref 8.9–10.3)
Chloride: 103 mmol/L (ref 98–111)
Creatinine, Ser: 1.17 mg/dL (ref 0.61–1.24)
GFR, Estimated: >60 mL/min (ref >60)
Glucose, Bld: 101 mg/dL — ABNORMAL HIGH (ref 70–99)
Potassium: 5.1 mmol/L (ref 3.5–5.1)
Sodium: 141 mmol/L (ref 135–145)
Total Bilirubin: 1.6 mg/dL — ABNORMAL HIGH (ref 0.3–1.2)
Total Protein: 6.1 g/dL — ABNORMAL LOW (ref 6.5–8.1)

## 2020-06-16 LAB — I-STAT CHEM 8, ED
BUN: 17 mg/dL (ref 6–20)
Calcium, Ion: 1.1 mmol/L — ABNORMAL LOW (ref 1.15–1.40)
Chloride: 103 mmol/L (ref 98–111)
Creatinine, Ser: 1.1 mg/dL (ref 0.61–1.24)
Glucose, Bld: 98 mg/dL (ref 70–99)
HCT: 40 % (ref 39.0–52.0)
Hemoglobin: 13.6 g/dL (ref 13.0–17.0)
Potassium: 4.7 mmol/L (ref 3.5–5.1)
Sodium: 141 mmol/L (ref 135–145)
TCO2: 30 mmol/L (ref 22–32)

## 2020-06-16 LAB — URINALYSIS, ROUTINE W REFLEX MICROSCOPIC
Bilirubin Urine: NEGATIVE
Glucose, UA: NEGATIVE mg/dL
Hgb urine dipstick: NEGATIVE
Ketones, ur: NEGATIVE mg/dL
Leukocytes,Ua: NEGATIVE
Nitrite: NEGATIVE
Protein, ur: NEGATIVE mg/dL
Specific Gravity, Urine: 1.001 — ABNORMAL LOW (ref 1.005–1.030)
pH: 7 (ref 5.0–8.0)

## 2020-06-16 LAB — RAPID URINE DRUG SCREEN, HOSP PERFORMED
Amphetamines: NOT DETECTED
Barbiturates: NOT DETECTED
Benzodiazepines: NOT DETECTED
Cocaine: NOT DETECTED
Opiates: NOT DETECTED
Tetrahydrocannabinol: NOT DETECTED

## 2020-06-16 LAB — CBC
HCT: 41.7 % (ref 39.0–52.0)
Hemoglobin: 14.2 g/dL (ref 13.0–17.0)
MCH: 30.5 pg (ref 26.0–34.0)
MCHC: 34.1 g/dL (ref 30.0–36.0)
MCV: 89.5 fL (ref 80.0–100.0)
Platelets: 84 10*3/uL — ABNORMAL LOW (ref 150–400)
RBC: 4.66 MIL/uL (ref 4.22–5.81)
RDW: 12.3 % (ref 11.5–15.5)
WBC: 4.6 10*3/uL (ref 4.0–10.5)
nRBC: 0 % (ref 0.0–0.2)

## 2020-06-16 LAB — DIFFERENTIAL
Abs Immature Granulocytes: 0.02 10*3/uL (ref 0.00–0.07)
Basophils Absolute: 0 10*3/uL (ref 0.0–0.1)
Basophils Relative: 0 %
Eosinophils Absolute: 0.1 10*3/uL (ref 0.0–0.5)
Eosinophils Relative: 1 %
Immature Granulocytes: 0 %
Lymphocytes Relative: 37 %
Lymphs Abs: 1.7 10*3/uL (ref 0.7–4.0)
Monocytes Absolute: 0.3 10*3/uL (ref 0.1–1.0)
Monocytes Relative: 7 %
Neutro Abs: 2.5 10*3/uL (ref 1.7–7.7)
Neutrophils Relative %: 55 %

## 2020-06-16 LAB — APTT: aPTT: 20 seconds — ABNORMAL LOW (ref 24–36)

## 2020-06-16 LAB — PROTIME-INR
INR: 1.1 (ref 0.8–1.2)
Prothrombin Time: 13.7 seconds (ref 11.4–15.2)

## 2020-06-16 NOTE — Consult Note (Signed)
Neurology Consultation Reason for Consult: Code stroke Requesting Physician: Lavenia Atlas  CC: AMS  History is obtained from: Chart review and son given AMS   HPI: Lonnie Skinner is a 60 y.o. male with a PMHx of COVID + this week, HTN, HLD, CdHF, CAD with NSTEMI with PCI right CA in 05/2013, GAD, ADD, CKD II, remote tobacco abuse quit in 2007, DVT, and chronic venous insufficiency. Pt was seen in the ED overnight on 1.25.22 with sx of URI and found to be COVID +. He has not been vaccinated. He was found to be stable and d/c'd home.    Today, patient came back to ED via EMS. Per son, patient has been sick for 10 days or so, with decreased appetite and feeling blah. This am, at home, son states he was acting normally and was not confused. Son went to make a phone call. After son returned from call, son found patient on the floor, jerking, and unable to speak. Son states patient was awake during the event but was "glazed over". Patient was confused. No incontinence, jerking of head, post ictal period, or tongue biting noted. Son called 911. Son denies any hx of seizures. Son can not relate the time of this incident nor the last seen well time. States his father is usually mentally astute and this confusion is totally new.   I asked the son about any mental handicap because patient seems so childlike on exam. Son denies, but chart review showed note of mental impairment. Of note, patient's wife is in the hospital now with COVID (admitted overnight with a large stroke, for which husband was called overnight to consent).   On chart note of 10/30/18, PCP was trying to wean him off of Alprazolam and started Sertraline. Unclear if he is still taking this medication.   LKW: unknown tPA given?: No, out of window.  MRS: 1  ROS: Unable to obtain due to altered mental status. Patient just states he is sick.   Past Medical History:  Diagnosis Date  . CAD (coronary artery disease)    a. 05/2013: NSTEMI s/p DES  to mRCA (culprit vessel), residual moderate LAD disease (the patient fails medical therapy and this was found to be significant, this would be amenable to PCI).  . CHF (congestive heart failure) ([place])   . CKD (chronic kidney disease), stage II   . DVT (deep venous thrombosis) ([place]) 1981  . Hypercholesteremia   . Hyperglycemia   . Hypertension     Family History  Problem Relation Age of Onset  . CAD Other        Multiple family members with CABG  . Heart disease Mother        CABG  . Heart attack Mother   . Stroke Mother   . Heart failure Mother   . Cancer Father   . Heart disease Brother        cabg  . Heart attack Brother     Social History:  reports that he has quit smoking. He has never used smokeless tobacco. He reports that he does not drink alcohol and does not use drugs.  Exam: Current vital signs: BP (!) 134/102   Pulse 76   Temp 97.9 F (36.6 C) (Oral)   Resp 15   SpO2 95%  Vital signs in last 24 hours: Temp:  [97.9 F (36.6 C)-98.7 F (37.1 C)] 97.9 F (36.6 C) (01/27 1402) Pulse Rate:  [56-76] 76 (01/27 1615) Resp:  [14-19] 15 (  01/27 1615) BP: (128-155)/(76-102) 134/102 (01/27 1615) SpO2:  [95 %-99 %] 95 % (01/27 1615)   Physical Exam  Constitutional: Appears well-developed and well-nourished. Child like traits on exam. Psych: Confused, cooperative, labile affect, very concerned that he might be having a stroke.  Eyes: No scleral injection HENT: No OP obstruction, extremely poor dentition MSK: no joint deformities.  Cardiovascular: Normal rate and regular rhythm.  Respiratory: Effort normal, non-labored breathing GI: Soft.  No distension. There is no tenderness.  Skin: WDI  1a Level of Conscious: 0 1b LOC Questions: 2 1c LOC Commands: 0 2 Best Gaze: 0 3 Visual: 0 4 Facial Palsy: 0 5a Motor Arm - left: 0 5b Motor Arm - Right: 0 6a Motor Leg - Left: 0 6b Motor Leg - Right: 0 7 Limb Ataxia: 0 8 Sensory: 0 9 Best Language: 0 10 Dysarthria:  0 11 Extinct. and Inatten: 0 TOTAL: 2   Neuro: Mental Status: Patient is awake, alert. Does not know his name, age, place, time, or situation. He is very easily distracted. Patient is unable to give a clear and coherent history. No signs of aphasia or neglect. No dysarthria. He perseverates. Unable to name or repeat phrases. His comprehension is delayed and he does not follow all commands.  Cranial Nerves: II: Visual Fields are full. Pupils are equal, round, and reactive to light.   III,IV, VI: EOMI without ptosis  V: Facial sensation is symmetric to light touch. VII: Facial movement is symmetric.  VIII: hearing is intact to voice X: Uvula elevates symmetrically XI: Shoulder shrug is symmetric. XII: tongue is midline without atrophy or fasciculations.  Motor: Tone is notable for mild paratonia. Bulk is normal. 5/5 strength was present in all four extremities.  Sensory: Sensation is symmetric to light touch in the arms and legs. Deep Tendon Reflexes: 3+ and symmetric in the biceps and patellae.  Plantars: Toes are downgoing bilaterally.  Cerebellar: No tremors. Unable to perform FNF due to poor attention.   I have reviewed labs in epic and the results pertinent to this consultation are: Na 141, K 4.7, creat 1.10, Hgb 13.6, WBCC 4.6, INR 1.1, aPTT 13.7, Glucose 98, UDS negative.   I have reviewed the images obtained: CT head 1. No acute finding by CT. One could question asymmetric low-density in the left pons, but this may relate to beam hardening. 2. ASPECTS is 10.  Assessment: 60 yo male known COVID + who presented to ED via EMS today for AMS. Patient has been feeling sick x 10 days with poor po intake. However, patient was in that usual state of health until this am, when son found him on the floor with jerking activity with post confusion noted by son at home. No hx of seizures per son. CT head negative for acute finding. Stat EEG and MRI brain to follow. The differential  diagnoses are AMS due to COVID, other infection, metabolic derangement, seizure, and stroke. Also, do not know if patient continues to take Benzos and if he could have had a withdrawal seizure. His UDS is neg for Benzo.  His stroke risk factors include CAD, HLD, HTN, hx tobacco abuse.   Impression:  1. AMS 2. Hx of developmental delay contributing to exam?  3. Possible acute stress reaction given wife's recent stroke  Recommendations: 1. EEG, routine 2. Seizure precautions.  3. MRI brain without contrast. 4. Cardiac, infectious and metabolic workup per ED Given concern for transient loss of consciousness, patient should be counseled to report  this episode to the [facility], not drive until cleared by the [facility], and to be cautious with any activities in which a sudden loss of consciousness would be detrimental to life/limb (swimming alone, taking a tub bath alone, holding a small child/infant without someone else nearby, etc.)  If  MRI brain and EEG are negative, patient needs no further neurological workup at this time and can have an outpatient neurology follow-up.   Patient seen by Clance Boll, NP and Lesleigh Noe MD-PhD. Note/plan to be edited as needed by MD.  [facility] [phone]  Attending Neurologist's note:  I personally saw this patient, gathering history, performing a full neurologic examination, reviewing relevant labs, personally reviewing relevant imaging including CT head, and formulated the assessment and plan, adding the note above for completeness and clarity to accurately reflect my thoughts  Lesleigh Noe MD-PhD [facility] [phone]

## 2020-06-16 NOTE — Code Documentation (Signed)
Stroke Response Nurse Documentation Code Documentation  Lonnie Skinner is a 60 y.o. male arriving to [facility] ED via [facility] on 06/16/2020 with past medical hx of COVID+. Code stroke was activated by EDP. Patient from home where he was LKW at 1115 per son and now complaining of acute onset of confusion. Pt was generalized weak this morning. Around 1115, pt was noted to be lying on the floor confused not making sense per son. Called EMS.   Stroke team at the bedside on patient arrival. Labs drawn and patient cleared for CT by [place], [place]. Patient to CT with team. NIHSS 1, see documentation for details and code stroke times. Patient with disoriented on exam. The following imaging was completed:  CT.  Patient is not a candidate for tPA due to being outside window. Care/Plan: q2 mNIHSS/VS. Bedside handoff with ED RN [place].    Kathrin Greathouse  Stroke Response RN

## 2020-06-16 NOTE — ED Provider Notes (Signed)
[facility] EMERGENCY DEPARTMENT Provider Note   CSN: [id number] Arrival date & time: 06/16/20  1356     History Chief Complaint  Patient presents with  . Altered Mental Status  . Covid Positive    Lonnie Skinner is a 60 y.o. male with PMH of HTN, HLD, CHF, CAD, and DVT who presents to the ED via EMS for altered mental status in the context of COVID-19 diagnosis.  NOTE: There was an issue with the EMR and his past medical history and chart was unavailable for a period of time after his arrival to the ED via EMS.  He was reportedly altered and there was not any accurate information in his chart for contacting family members to corroborate story.    I reviewed patient's medical record and he was evaluated on 06/14/2020 for 3days URI symptoms.  Patient and wife are both unvaccinated.  Patient reportedly is already admitted to the hospital.  Patient was nontoxic-appearing and basic laboratory work-up and imaging was obtained and unremarkable.  He was discharged home.  On my examination, patient is a particularly poor historian.  He states that he just "feels sick".  He is moderately answering questions appropriately is simply confused versus simply feeling poorly in the setting of COVID-19 infection.  Suspected he was perhaps confused in the context of COVID-19 fever, but his temperature here in the ED is within normal months.  Vital signs are overall reassuring.  Level 5 Caveat due to AMS.  I spoke with patient's son, Lonnie Skinner, who reports that patient has been sick for approximately 10 to 14 days.  However, aside from feeling poorly, he was not expressing any focal neurologic deficits.  However, this morning around 11:15 AM, he noticed that this patient was on the ground in the hallway, possible fall.  He was "shaking" and moaning incomprehensibly.  No tongue-biting or incontinence, but patient became confused.  No hx seizure disorder.  Son states that while he had been ill  previously, he had not been expressing any confusion or change in his mental status.  This was a significant deviation from his baseline.  He also states that he is unsure as to whether or not patient has been taking his Plavix medication.  He does have a history of DVT.  Of note, his wife is currently in the ICU for ischemic stroke with paraplegia.  HPI     Past Medical History:  Diagnosis Date  . CAD (coronary artery disease)    a. 05/2013: NSTEMI s/p DES to mRCA (culprit vessel), residual moderate LAD disease (the patient fails medical therapy and this was found to be significant, this would be amenable to PCI).  . CHF (congestive heart failure) ([place])   . CKD (chronic kidney disease), stage II   . DVT (deep venous thrombosis) ([place]) 1981  . Hypercholesteremia   . Hyperglycemia   . Hypertension     Patient Active Problem List   Diagnosis Date Noted  . Chronic venous insufficiency 01/04/2017  . Acute on chronic diastolic CHF (congestive heart failure) ([place]) 05/10/2016  . CAD (coronary artery disease)   . CKD (chronic kidney disease), stage II   . History of tobacco abuse 06/02/2013  . Acute myocardial infarction of other inferior wall, initial episode of care   . NSTEMI (non-ST elevated myocardial infarction) ([place]) 06/01/2013  . Hyperlipidemia 05/19/2009  . Generalized anxiety disorder 05/19/2009  . NICOTINE ADDICTION 05/19/2009  . ADD 05/19/2009  . Essential hypertension, benign 05/19/2009  .  UNEQUAL LEG LENGTH 05/19/2009    Past Surgical History:  Procedure Laterality Date  . CORONARY STENT PLACEMENT  05/2013   mRCA  . CORONARY/GRAFT ACUTE MI REVASCULARIZATION  2015  . KNEE SURGERY Left 2009  . LEFT HEART CATHETERIZATION WITH CORONARY ANGIOGRAM N/A 06/02/2013   Procedure: LEFT HEART CATHETERIZATION WITH CORONARY ANGIOGRAM;  Surgeon: [name], MD;  Location: [facility] CATH LAB;  Service: Cardiovascular;  Laterality: N/A;  . PERCUTANEOUS CORONARY STENT INTERVENTION (PCI-S)   06/02/2013   Procedure: PERCUTANEOUS CORONARY STENT INTERVENTION (PCI-S);  Surgeon: [name], MD;  Location: [facility] CATH LAB;  Service: Cardiovascular;;       Family History  Problem Relation Age of Onset  . CAD Other        Multiple family members with CABG  . Heart disease Mother        CABG  . Heart attack Mother   . Stroke Mother   . Heart failure Mother   . Cancer Father   . Heart disease Brother        cabg  . Heart attack Brother     Social History   Tobacco Use  . Smoking status: Former Research scientist (life sciences)  . Smokeless tobacco: Never Used  . Tobacco comment: Quit ~2007  Substance Use Topics  . Alcohol use: No  . Drug use: No    Home Medications Prior to Admission medications   Medication Sig Start Date End Date Taking? Authorizing Provider  ALPRAZolam (XANAX) 0.25 MG tablet TAKE 1 TABLET(0.25 MG) BY MOUTH TWICE DAILY AS NEEDED FOR ANXIETY 03/09/20   Meccariello, Bernita Raisin, DO  aspirin 81 MG EC tablet Take 1 tablet (81 mg total) by mouth daily. 10/30/18   Nuala Alpha, DO  atorvastatin (LIPITOR) 40 MG tablet Take 1 tablet (40 mg total) by mouth every evening. 01/20/20   Meccariello, Bernita Raisin, DO  carvedilol (COREG) 12.5 MG tablet Take 1 tablet (12.5 mg total) by mouth 2 (two) times daily. 06/03/19   Nuala Alpha, DO  clopidogrel (PLAVIX) 75 MG tablet Take 1 tablet (75 mg total) by mouth daily. 01/20/20   Meccariello, Bernita Raisin, DO  clotrimazole-betamethasone (LOTRISONE) cream APPLY TO THE AFFECTED AREA TWICE DAILY 11/05/17   Nuala Alpha, DO  furosemide (LASIX) 40 MG tablet Take 1 tablet (40 mg total) by mouth daily. 01/20/20   Meccariello, Bernita Raisin, DO  isosorbide mononitrate (IMDUR) 30 MG 24 hr tablet TAKE 1 TABLET BY MOUTH DAILY, PLEASE SCHEDULE OVERDUE APPOINTMENT FOR FUTURE REFILLS 1ST ATTEMPT 06/03/19   Nuala Alpha, DO  nitroGLYCERIN (NITROSTAT) 0.4 MG SL tablet Place 1 tablet (0.4 mg total) under the tongue every 5 (five) minutes as needed for chest pain (up to  3 doses). 05/01/17   Dorothy Spark, MD  triamcinolone cream (KENALOG) 0.1 % APPLY TOPICALLY TO THE AFFECTED AREA TWICE DAILY 10/16/17   Nuala Alpha, DO    Allergies    Patient has no known allergies.  Review of Systems   Review of Systems  Unable to perform ROS: Mental status change    Physical Exam Updated Vital Signs BP (!) 142/83   Pulse (!) 58   Temp 97.9 F (36.6 C) (Oral)   Resp 17   SpO2 95%   Physical Exam Vitals and nursing note reviewed. Exam conducted with a chaperone present.  Constitutional:      General: He is not in acute distress.    Appearance: He is not toxic-appearing.  HENT:     Head: Normocephalic and atraumatic.  Mouth/Throat:     Comments: No facial asymmetry or obvious droop. Eyes:     General: No scleral icterus.    Extraocular Movements: Extraocular movements intact.     Conjunctiva/sclera: Conjunctivae normal.     Pupils: Pupils are equal, round, and reactive to light.     Comments: No obvious nystagmus.  PERRL and EOM intact.  Cardiovascular:     Rate and Rhythm: Normal rate and regular rhythm.     Pulses: Normal pulses.  Pulmonary:     Effort: Pulmonary effort is normal. No respiratory distress.     Breath sounds: Normal breath sounds. No wheezing.     Comments: CTA bilaterally.  No increased work of breathing.  No tachypnea.  95% on RA. Musculoskeletal:        General: Normal range of motion.     Cervical back: Normal range of motion. No rigidity.     Comments: Can move all extremities.  Skin:    General: Skin is dry.     Capillary Refill: Capillary refill takes less than 2 seconds.  Neurological:     General: No focal deficit present.     Mental Status: He is alert and oriented to person, place, and time.     GCS: GCS eye subscore is 4. GCS verbal subscore is 5. GCS motor subscore is 6.     Cranial Nerves: No cranial nerve deficit.     Motor: No weakness.     Comments: CN II through XII grossly intact.  Patient  reluctant to participate in full neuro exam.  Psychiatric:        Mood and Affect: Mood normal.     Comments: Anxious appearing.  States that he does not remember at times, but then remembers.  Waxing and waning mentation.     ED Results / Procedures / Treatments   Labs (all labs ordered are listed, but only abnormal results are displayed) Labs Reviewed  APTT - Abnormal; Notable for the following components:      Result Value   aPTT 20 (*)    All other components within normal limits  CBC - Abnormal; Notable for the following components:   Platelets 84 (*)    All other components within normal limits  COMPREHENSIVE METABOLIC PANEL - Abnormal; Notable for the following components:   Glucose, Bld 101 (*)    Calcium 8.8 (*)    Total Protein 6.1 (*)    Albumin 3.2 (*)    AST 43 (*)    Total Bilirubin 1.6 (*)    All other components within normal limits  URINALYSIS, ROUTINE W REFLEX MICROSCOPIC - Abnormal; Notable for the following components:   Color, Urine STRAW (*)    Specific Gravity, Urine 1.001 (*)    All other components within normal limits  I-STAT CHEM 8, ED - Abnormal; Notable for the following components:   Calcium, Ion 1.10 (*)    All other components within normal limits  ETHANOL  PROTIME-INR  DIFFERENTIAL  RAPID URINE DRUG SCREEN, HOSP PERFORMED  AMMONIA    EKG None  Radiology CT HEAD CODE STROKE WO CONTRAST  Result Date: 06/16/2020 CLINICAL DATA:  Code stroke. Dysarthria. Altered mental status. Confusion. EXAM: CT HEAD WITHOUT CONTRAST TECHNIQUE: Contiguous axial images were obtained from the base of the skull through the vertex without intravenous contrast. COMPARISON:  None. FINDINGS: Brain: Cerebral hemispheres appear normal. No focal cerebellar finding. One could question asymmetric low-density in the left pons, but this may relate to beam  hardening. No hemorrhage, hydrocephalus or extra-axial collection Vascular: No abnormal vascular finding. Skull: Normal  Sinuses/Orbits: Clear/normal Other: None ASPECTS ([place] Stroke Program Early CT Score) - Ganglionic level infarction (caudate, lentiform nuclei, internal capsule, insula, M1-M3 cortex): 7 - Supraganglionic infarction (M4-M6 cortex): 3 Total score (0-10 with 10 being normal): 10 IMPRESSION: 1. No acute finding by CT. One could question asymmetric low-density in the left pons, but this may relate to beam hardening. 2. ASPECTS is 10. 3. These results were communicated to Dr. Curly Shores at 3:44 pmon 1/27/2022by text page via the [facility] messaging system. Electronically Signed   By: Nelson Chimes M.D.   On: 06/16/2020 15:45    Procedures Procedures   Medications Ordered in ED Medications - No data to display  ED Course  I have reviewed the triage vital signs and the nursing notes.  Pertinent labs & imaging results that were available during my care of the patient were reviewed by me and considered in my medical decision making (see chart for details).  Clinical Course as of 06/16/20 1858  Thu Jun 16, 2020  1746 I once again spoke with Dr. Curly Shores regarding this patient. She had noticed intellectual impairment in his prior records, but given son's report that this was a significant deviation from his baseline, she has concern for acute stress reaction (wife in ICU for stroke) versus benzodiazapine withdrawal.  She also has MRI ordered for continued stroke work-up.  She did tell that she will place an order for EEG here in the ED.  She anticipates that if both the MRI and EEG are unremarkable, he is reasonable for discharge home and continued outpatient follow-up. [GG]  1826 I spoke with Lonnie Skinner, his son, who understands that patient will be discharged if MRI and EEG are negative.  He is reassured by the negative work-up thus far.   [GG]    Clinical Course User Index [GG] Corena Herter, PA-C   MDM Rules/Calculators/A&P                          LONZY BABIARZ was evaluated in Emergency Department on  06/16/2020 for the symptoms described in the history of present illness. He was evaluated in the context of the global COVID-19 pandemic, which necessitated consideration that the patient might be at risk for infection with the SARS-CoV-2 virus that causes COVID-19. Institutional protocols and algorithms that pertain to the evaluation of patients at risk for COVID-19 are in a state of rapid change based on information released by regulatory bodies including the CDC and federal and state organizations. These policies and algorithms were followed during the patient's care in the ED.  I personally reviewed patient's medical chart and all notes from triage and staff during today's encounter. I have also ordered and reviewed all labs and imaging that I felt to be medically necessary in the evaluation of this patient's complaints and with consideration of their with their physical exam. If needed, translation services were available and utilized.   Patient appears to be encephalopathic in context of COVID-19 infection.  No fevers that could explain his confusion.  Given that his deviation and mentation/baseline according to son occurred in the past 4.5 hours, will initiate code stroke and consult with neurology.  While my physical exam was not particularly alarming, he is reluctant to participate in full neuro exam and I appreciate input from specialist.  Dr. Dina Rich evaluated patient and agrees with plan.   I spoke  with Dr. Curly Shores, neuro hospitalist, who states that his neurologic exam was largely benign and low suspicion for LVO.  We will proceed with laboratory work-up to assess for possible explanation of his confusion and altered mental status.  If laboratory work-up is reassuring, she will likely recommend hospitalist admission for further work-up including MRI and EEG.  Given chronicity and lack of focal deficits, she states that patient is not a TPA candidate we can cancel code stroke.  Laboratory work-up is  personally reviewed and entirely unremarkable.  UDS was negative.  No significant electrolyte derangement or renal impairment.  Ammonia is added, but low suspicion that is the cause of his confusion here today.  He remains afebrile.  Does not appear to be in any acute distress.  Renal function intact.  I once again spoke with Dr. Curly Shores regarding this patient. She had noticed intellectual impairment in his prior records, but given son's report that this was a significant deviation from his baseline, she has concern for acute stress reaction (wife in ICU for stroke) versus benzodiazapine withdrawal.  She also has MRI ordered for continued stroke work-up.  She did tell that she will place an order for EEG here in the ED.  She anticipates that if both the MRI and EEG are unremarkable, he is reasonable for discharge home and continued outpatient follow-up.   I spoke with Lonnie Skinner, his son, who understands that patient will be discharged if MRI and EEG are negative.  He is reassured by the negative work-up thus far.    At shift change care was transferred to [facility] PA who will follow pending studies, re-evaluate, and determine disposition.     Final Clinical Impression(s) / ED Diagnoses Final diagnoses:  Confusion  COVID-19    Rx / DC Orders ED Discharge Orders    None       Corena Herter, PA-C 06/16/20 1858    Lorelle Gibbs, DO 06/16/20 1933

## 2020-06-16 NOTE — ED Notes (Signed)
Pt to MRI via stretcher.

## 2020-06-16 NOTE — ED Triage Notes (Signed)
Pt BIB [facility] from home. Pt discharged yesterday with COVID. PT had a near syncopal episode at home and is confused. When asking pt who he is pt states I don't know. Pt VS stable, 100% on RA. Pt denies pain but states he cant breathe. SPO2 is 98% on RA.

## 2020-06-16 NOTE — ED Notes (Signed)
Patient transported to MRI 

## 2020-06-16 NOTE — Procedures (Signed)
Patient Name: Lonnie Skinner  MRN: [id number]  Epilepsy Attending: Lora Havens  Referring Physician/Provider: Dr Lesleigh Noe Date: 06/16/2020 Duration: 22.53 mins  Patient history: 60 yo male known COVID + who presented to ED via EMS today for AMS. Patient has been feeling sick x 10 days with poor po intake. However, patient was in that usual state of health until this am, when son found him on the floor with jerking activity with post confusion noted by son at home. EEG to evaluate for seizure  Level of alertness: Awake  AEDs during EEG study: None  Technical aspects: This EEG study was done with scalp electrodes positioned according to the 10-20 International system of electrode placement. Electrical activity was acquired at a sampling rate of 500Hz  and reviewed with a high frequency filter of 70Hz  and a low frequency filter of 1Hz . EEG data were recorded continuously and digitally stored.   Description: The posterior dominant rhythm consists of 8 Hz activity of moderate voltage (25-35 uV) seen predominantly in posterior head regions, symmetric and reactive to eye opening and eye closing. EEG showed intermittent generalized 3 to 6 Hz theta-delta slowing. Hyperventilation and photic stimulation were not performed.     ABNORMALITY -Intermittent slow, generalized  IMPRESSION: This study is suggestive of mild diffuse encephalopathy, nonspecific etiology. No seizures or epileptiform discharges were seen throughout the recording.  Lonnie Skinner

## 2020-06-16 NOTE — Progress Notes (Signed)
EEG complete - results pending 

## 2020-06-16 NOTE — ED Notes (Signed)
Son has been called to come get the pt.

## 2020-06-16 NOTE — ED Provider Notes (Incomplete)
  [facility] EMERGENCY DEPARTMENT Provider Note   CSN: [id number] Arrival date & time: 06/16/20  1155     History Chief Complaint  Patient presents with  . Altered Mental Status  . Covid Positive    Lonnie Skinner is a 60 y.o. male with PMH of HTN and MI who presents the ED via EMS for confusion and near syncopal episode in context of COVID-19.  On my examination, patient is limited in his ability to provide a good history.  He states that he was told that he is positive for COVID-19.  His chart is not.  He updated and the secretary states that it is currently being merged.  I cannot see the encounter from yesterday, but he did have his discharge papers with him.  He also states that he has never lived in [place], but his current chart is reflecting an address in [place] and a point of contact different from his wife.   HPI     Past Medical History:  Diagnosis Date  . Hypertension   . MI (myocardial infarction) ([place])     There are no problems to display for this patient.   History reviewed. No pertinent surgical history.     History reviewed. No pertinent family history.     Home Medications Prior to Admission medications   Not on File    Allergies    Patient has no known allergies.  Review of Systems   Review of Systems  Physical Exam Updated Vital Signs BP 129/82   Pulse 64   Temp 98.7 F (37.1 C) (Oral)   Resp 15   SpO2 97%   Physical Exam  ED Results / Procedures / Treatments   Labs (all labs ordered are listed, but only abnormal results are displayed) Labs Reviewed - No data to display  EKG None  Radiology No results found.  Procedures Procedures {Remember to document critical care time when appropriate:1}  Medications Ordered in ED Medications - No data to display  ED Course  I have reviewed the triage vital signs and the nursing notes.  Pertinent labs & imaging results that were available during my care  of the patient were reviewed by me and considered in my medical decision making (see chart for details).    MDM Rules/Calculators/A&P                          *** Final Clinical Impression(s) / ED Diagnoses Final diagnoses:  None    Rx / DC Orders ED Discharge Orders    None

## 2020-06-16 NOTE — Discharge Instructions (Addendum)
Your MRI and your EEG both were unremarkable.  You can be discharged home and can follow-up with your doctor next week for recheck

## 2020-06-16 NOTE — ED Provider Notes (Signed)
I assumed care of patient at shift change from previous team, please see their note for full H&P. Briefly patient is here for concern of altered mental status.  Concern for stroke versus side effects from Covid.  Plan is to follow-up on EEG and MRI.  If those are normal and reassuring anticipate patient will be able to discharge home. EEG returned without epileptic abnormalities.  MRI is still pending at this time.  At shift change care was transferred to Dr. Roderic Palau who will follow pending studies, re-evaulate and determine disposition.      Ollen Gross 06/16/20 2142    Milton Ferguson, MD 06/16/20 2340

## 2020-06-22 ENCOUNTER — Ambulatory Visit (INDEPENDENT_AMBULATORY_CARE_PROVIDER_SITE_OTHER): Payer: Medicare Other | Admitting: Family Medicine

## 2020-06-22 VITALS — BP 100/60 | HR 88

## 2020-06-22 DIAGNOSIS — F4321 Adjustment disorder with depressed mood: Secondary | ICD-10-CM | POA: Diagnosis not present

## 2020-06-22 DIAGNOSIS — R031 Nonspecific low blood-pressure reading: Secondary | ICD-10-CM | POA: Diagnosis not present

## 2020-06-22 DIAGNOSIS — U071 COVID-19: Secondary | ICD-10-CM | POA: Insufficient documentation

## 2020-06-22 NOTE — Progress Notes (Signed)
Subjective:   Patient ID: Lonnie Skinner    DOB: [birth year], 60 y.o. male   MRN: [id number]  Lonnie Skinner is a 60 y.o. male with a history of acute MI, CHF, CAD, chronic venous insufficiency, hypertension, CKD, ADD, GAD, history of tobacco abuse, hyperlipidemia, nicotine addiction here for continued Covid symptoms  Cough, Congestion: Patient and his son report to [facility] today for thorough evaluation after testing positive for COVID-19 on 1/25. They note that both patient and his wife tested positive for COVID, however she had a complication and passed away on 09-17-2022. Patient had an episode of fall and AMS on 1/27 requiring ED evaluation. His work up was overall unremarkable including UA, UDS, CMP, CBC, EEG, MRI, CT head. Him and his son note today that his symptoms have overall improved. He has intermittent mild dry cough and some congestion. His appetite has returned and he has been drinking plenty of fluids. He denies any fevers, chills, LE swelling, pleuritic chest pain, hemoptysis, SOB, nausea, vomiting, diarrhea. Denies lightheadedness/dizziness.   Review of Systems:  Per HPI.   Objective:   BP 100/60   Pulse 88   SpO2 94%   Repeat Vitals taken by Dr. Tarry Kos: BP 106/74, 95-96%, HR 78-80 Repeat Vitals taken by Dr. Tarry Kos: BP 97/74, O2 99-100%, HR 84 Vitals and nursing note reviewed.  General: pleasant older male, appears older than stated age, sitting comfortably in exam chair, well nourished, well developed, in no acute distress with non-toxic appearance HEENT: normocephalic, atraumatic, moist mucous membranes, oropharynx clear without erythema or exudate, TM normal bilaterally, swollen erythematous moist turbinates with some crusting appreciated on the left CV: regular rate and rhythm without murmurs, rubs, or gallops, no lower extremity edema, 2+ radial and pedal pulses bilaterally Lungs: clear to auscultation bilaterally with normal work of breathing on room air, speaking in full  sentences Skin: warm, dry, chronic dry hyperpigmented brown scaling on left anterior lower shin, normal skin turgor   Extremities: warm and well perfused, normal tone MSK:  gait normal Neuro: Alert and oriented, speech normal  Assessment & Plan:   COVID-19 virus infection Acute. Diagnosed on 1/25. ED evaluation for acute AMS and fall on 1/27 with no clear cause of AMS identified including no acute abnormalities identified on EEG, MRI, or CT scan. Has been improving slowly. Overall, exam reassuring today. O2 sats >95% on room air. Has remained afebrile with no signs of acute bacterial infection. Soft blood pressures today but asymptomatic. Tolerating PO. Low concern for PE.  - Provided reassurance  - Plan as below regarding soft blood pressures - continue to monitor symptoms. Discussed strict return precautions - Okay to end isolation on 06/24/20 as they prepare for wife's funeral   Low blood pressure, not hypotension Acute. Soft blood pressures today but asymptomatic. Does not appear overly hypovolemic on exam. Has been compliant with blood pressures.  - Recommended cutting Coreg in half BID. Continue this as long as blood pressures are <130/80. Return to full tablet BID if >130/80.  - monitor blood pressures at home daily - Continue Imdur. - Hold Lasix tomorrow, then restart  - ensure adequate hydration - strict return precautions discussed - recommended follow up with PCP in 1-2 weeks for continued monitoring   Grieving Lost wife to complications from [id number] on 06/17/20. Patient and son tearful during exam.  Provided support and recommended follow up if any further support could be provided in this difficult time.   No orders of the defined types  were placed in this encounter.  No orders of the defined types were placed in this encounter.   Mina Marble, DO PGY-3, [facility] 06/22/2020 9:13 PM

## 2020-06-22 NOTE — Assessment & Plan Note (Addendum)
Acute. Diagnosed on 1/25. ED evaluation for acute AMS and fall on 1/27 with no clear cause of AMS identified including no acute abnormalities identified on EEG, MRI, or CT scan. Has been improving slowly. Overall, exam reassuring today. O2 sats >95% on room air. Has remained afebrile with no signs of acute bacterial infection. Soft blood pressures today but asymptomatic. Tolerating PO. Low concern for PE.  - Provided reassurance  - Plan as below regarding soft blood pressures - continue to monitor symptoms. Discussed strict return precautions - Okay to end isolation on 06/24/20 as they prepare for wife's funeral

## 2020-06-22 NOTE — Patient Instructions (Signed)
It was a pleasure to see you today!  Thank you for choosing [facility] for your primary care.   Our plans for today were:  Please check blood pressure daily.   Cut the Carvedilol (Coreg) in half and take twice a day.   Continue taking half a Carvedilol twice a day as long as his blood pressure is <130/80. Once his blood pressure is above >130/80, restart taking the full dose of Carvedilol twice a day. Continue to monitor blood pressure to make sure it doesn't get too low   Be sure to take the Alprazolam (Xanax) ONLY as needed for anxiety.   Drink plenty of water to help improve your blood pressure  Follow up with your PCP (Dr. Sandi Carne) in 1-2 weeks to ensure things are still going well. Sooner if blood pressure is persistently low. Sooner if his blood pressure stay below 100/70.   Come back to clinic or report to ED if his cough worsens (becomes productive), develops shortness of breath, changes in his cognition, lightheadedness/dizziness, coughing up blood, unable to stay well hydrated, or fever  He may end isolation on Friday, 06/24/20  Again, I am really sorry for your loss. Please let us know if there is anything we can do to make things easier for you.  Thank you for allowing me to take care of you.   Best Wishes,   Mina Marble, DO

## 2020-06-22 NOTE — Assessment & Plan Note (Signed)
Acute. Soft blood pressures today but asymptomatic. Does not appear overly hypovolemic on exam. Has been compliant with blood pressures.  - Recommended cutting Coreg in half BID. Continue this as long as blood pressures are <130/80. Return to full tablet BID if >130/80.  - monitor blood pressures at home daily - Continue Imdur. - Hold Lasix tomorrow, then restart  - ensure adequate hydration - strict return precautions discussed - recommended follow up with PCP in 1-2 weeks for continued monitoring

## 2020-06-22 NOTE — Assessment & Plan Note (Signed)
Lost wife to complications from [id number] on 06/17/20. Patient and son tearful during exam.  Provided support and recommended follow up if any further support could be provided in this difficult time.

## 2020-07-27 ENCOUNTER — Ambulatory Visit (INDEPENDENT_AMBULATORY_CARE_PROVIDER_SITE_OTHER): Payer: Medicare Other | Admitting: Cardiology

## 2020-07-27 ENCOUNTER — Other Ambulatory Visit: Payer: Self-pay

## 2020-07-27 ENCOUNTER — Encounter: Payer: Self-pay | Admitting: Cardiology

## 2020-07-27 VITALS — BP 140/90 | HR 65 | Ht 68.0 in | Wt 195.0 lb

## 2020-07-27 DIAGNOSIS — N182 Chronic kidney disease, stage 2 (mild): Secondary | ICD-10-CM

## 2020-07-27 DIAGNOSIS — I10 Essential (primary) hypertension: Secondary | ICD-10-CM | POA: Diagnosis not present

## 2020-07-27 DIAGNOSIS — Z01812 Encounter for preprocedural laboratory examination: Secondary | ICD-10-CM | POA: Diagnosis not present

## 2020-07-27 DIAGNOSIS — E785 Hyperlipidemia, unspecified: Secondary | ICD-10-CM

## 2020-07-27 DIAGNOSIS — I25118 Atherosclerotic heart disease of native coronary artery with other forms of angina pectoris: Secondary | ICD-10-CM

## 2020-07-27 LAB — CBC
Hematocrit: 42 % (ref 37.5–51.0)
Hemoglobin: 14.3 g/dL (ref 13.0–17.7)
MCH: 31.1 pg (ref 26.6–33.0)
MCHC: 34 g/dL (ref 31.5–35.7)
MCV: 91 fL (ref 79–97)
Platelets: 210 10*3/uL (ref 150–450)
RBC: 4.6 x10E6/uL (ref 4.14–5.80)
RDW: 13.7 % (ref 11.6–15.4)
WBC: 7.3 10*3/uL (ref 3.4–10.8)

## 2020-07-27 LAB — BASIC METABOLIC PANEL
BUN/Creatinine Ratio: 18 (ref 10–24)
BUN: 20 mg/dL (ref 8–27)
CO2: 25 mmol/L (ref 20–29)
Calcium: 9.3 mg/dL (ref 8.6–10.2)
Chloride: 104 mmol/L (ref 96–106)
Creatinine, Ser: 1.14 mg/dL (ref 0.76–1.27)
Glucose: 95 mg/dL (ref 65–99)
Potassium: 4.7 mmol/L (ref 3.5–5.2)
Sodium: 142 mmol/L (ref 134–144)
eGFR: 74 mL/min/{1.73_m2} (ref >59)

## 2020-07-27 NOTE — Patient Instructions (Signed)
Medication Instructions:  Your physician recommends that you continue on your current medications as directed. Please refer to the Current Medication list given to you today.  *If you need a refill on your cardiac medications before your next appointment, please call your pharmacy*   Lab Work: Pre procedure labs today: BMET & CBC If you have labs (blood work) drawn today and your tests are completely normal, you will receive your results only by: Marland Kitchen MyChart Message (if you have MyChart) OR . A paper copy in the mail If you have any lab test that is abnormal or we need to change your treatment, we will call you to review the results.   Testing/Procedures: Your physician has requested that you have a cardiac catheterization. Cardiac catheterization is used to diagnose and/or treat various heart conditions. Doctors may recommend this procedure for a number of different reasons. The most common reason is to evaluate chest pain. Chest pain can be a symptom of coronary artery disease (CAD), and cardiac catheterization can show whether plaque is narrowing or blocking your heart's arteries. This procedure is also used to evaluate the valves, as well as measure the blood flow and oxygen levels in different parts of your heart. For further information please visit [email or web address]tn. Please follow instructions below located under "other instructions".   Follow-Up: At [facility], you and your health needs are our priority.  As part of our continuing mission to provide you with exceptional heart care, we have created designated Provider Care Teams.  These Care Teams include your primary Cardiologist (physician) and Advanced Practice Providers (APPs -  Physician Assistants and Nurse Practitioners) who all work together to provide you with the care you need, when you need it.   Your next appointment:   To be determined after cath  The format for your next appointment:   In Person  Provider:    Gwyndolyn Kaufman, MD    Thank you for choosing [facility]!!   [phone]   Other Instructions   COVID TEST-- On 07/30/2020 @ 10:30 am - You will go to [place] for your Covid testing.   This is a drive thru test site, stay in your car and the nurse team will come to your car to test you.  After you are tested please go home and self quarantine until the day of your procedure.      [place] OFFICE [place], [place] [address] Dept: [phone] Loc: [phone]  Lonnie Skinner  07/27/2020  You are scheduled for a Cardiac Catheterization on Tuesday, March 15 with Dr. Peter [name].  1. Please arrive at the [facility] (Main Entrance A) at [facility]: [address] [place], [place] [address] at 8:30 AM (This time is two hours before your procedure to ensure your preparation). Free valet parking service is available.   Special note: Every effort is made to have your procedure done on time. Please understand that emergencies sometimes delay scheduled procedures.  2. Diet: Nothing to eat or drink after midnight the night before procedure.    3. Labs: Today.  You do not need to be fasting.  4. Medication instructions in preparation for your procedure:   Contrast Allergy: No  Hold all your medications the morning of EXCEPT the medications below: On the morning of your procedure, take your Aspirin and Plavix/Clopidogrel and any morning medicines NOT listed above.  You  may use sips of water.  5. Plan for one night stay--bring personal belongings. 6. Bring a current list of your medications and current insurance cards. 7. You MUST have a responsible person to drive you home. 8. Someone MUST be with you the first 24 hours after you arrive home or your discharge will be delayed. 9. Please wear clothes that are easy to get on and off and wear  slip-on shoes.  Thank you for allowing [place] to care for you!   -- [facility]     Coronary Angiogram With Stent Coronary angiogram with stent placement is a procedure to widen or open a narrow blood vessel of the heart (coronary artery). Arteries may become blocked by cholesterol buildup (plaques) in the lining of the artery wall. When a coronary artery becomes partially blocked, blood flow to that area decreases. This may lead to chest pain or a heart attack (myocardial infarction). A stent is a small piece of metal that looks like mesh or spring. Stent placement may be done as treatment after a heart attack, or to prevent a heart attack if a blocked artery is found by a coronary angiogram. Let your health care provider know about:  Any allergies you have, including allergies to medicines or contrast dye.  All medicines you are taking, including vitamins, herbs, eye drops, creams, and over-the-counter medicines.  Any problems you or family members have had with anesthetic medicines.  Any blood disorders you have.  Any surgeries you have had.  Any medical conditions you have, including kidney problems or kidney failure.  Whether you are pregnant or may be pregnant.  Whether you are breastfeeding. What are the risks? Generally, this is a safe procedure. However, serious problems may occur, including:  Damage to nearby structures or organs, such as the heart, blood vessels, or kidneys.  A return of blockage.  Bleeding, infection, or bruising at the insertion site.  A collection of blood under the skin (hematoma) at the insertion site.  A blood clot in another part of the body.  Allergic reaction to medicines or dyes.  Bleeding into the abdomen (retroperitoneal bleeding).  Stroke (rare).  Heart attack (rare). What happens before the procedure? Staying hydrated Follow instructions from your health care provider about hydration, which may  include:  Up to 2 hours before the procedure - you may continue to drink clear liquids, such as water, clear fruit juice, black coffee, and plain tea.   Eating and drinking restrictions Follow instructions from your health care provider about eating and drinking, which may include:  8 hours before the procedure - stop eating heavy meals or foods, such as meat, fried foods, or fatty foods.  6 hours before the procedure - stop eating light meals or foods, such as toast or cereal.  2 hours before the procedure - stop drinking clear liquids. Medicines Ask your health care provider about:  Changing or stopping your regular medicines. This is especially important if you are taking diabetes medicines or blood thinners.  Taking medicines such as aspirin and ibuprofen. These medicines can thin your blood. Do not take these medicines unless your health care provider tells you to take them. ? Generally, aspirin is recommended before a thin tube, called a catheter, is passed through a blood vessel and inserted into the heart (cardiac catheterization).  Taking over-the-counter medicines, vitamins, herbs, and supplements. General instructions  Do not use any products that contain nicotine or tobacco for at least 4 weeks before  the procedure. These products include cigarettes, e-cigarettes, and chewing tobacco. If you need help quitting, ask your health care provider.  Plan to have someone take you home from the hospital or clinic.  If you will be going home right after the procedure, plan to have someone with you for 24 hours.  You may have tests and imaging procedures.  Ask your health care provider: ? How your insertion site will be marked. Ask which artery will be used for the procedure. ? What steps will be taken to help prevent infection. These may include:  Removing hair at the insertion site.  Washing skin with a germ-killing soap.  Taking antibiotic medicine. What happens during the  procedure?  An IV will be inserted into one of your veins.  Electrodes may be placed on your chest to monitor your heart rate during the procedure.  You will be given one or more of the following: ? A medicine to help you relax (sedative). ? A medicine to numb the area (local anesthetic) for catheter insertion.  A small incision will be made for catheter insertion.  The catheter will be inserted into an artery using a guide wire. The location may be in your groin, your wrist, or the fold of your arm (near your elbow).  An X-ray procedure (fluoroscopy) will be used to help guide the catheter to the opening of the heart arteries.  A dye will be injected into the catheter. X-rays will be taken. The dye helps to show where any narrowing or blockages are located in the arteries.  Tell your health care provider if you have chest pain or trouble breathing.  A tiny wire will be guided to the blocked spot, and a balloon will be inflated to make the artery wider.  The stent will be expanded to crush the plaques into the wall of the vessel. The stent will hold the area open and improve the blood flow. Most stents have a drug coating to reduce the risk of the stent narrowing over time.  The artery may be made wider using a drill, laser, or other tools that remove plaques.  The catheter will be removed when the blood flow improves. The stent will stay where it was placed, and the lining of the artery will grow over it.  A bandage (dressing) will be placed on the insertion site. Pressure will be applied to stop bleeding.  The IV will be removed. This procedure may vary among health care providers and hospitals.   What happens after the procedure?  Your blood pressure, heart rate, breathing rate, and blood oxygen level will be monitored until you leave the hospital or clinic.  If the procedure is done through the leg, you will lie flat in bed for a few hours or for as long as told by your health  care provider. You will be instructed not to bend or cross your legs.  The insertion site and the pulse in your foot or wrist will be checked often.  You may have more blood tests, X-rays, and a test that records the electrical activity of your heart (electrocardiogram, or ECG).  Do not drive for 24 hours if you were given a sedative during your procedure. Summary  Coronary angiogram with stent placement is a procedure to widen or open a narrowed coronary artery. This is done to treat heart problems.  Before the procedure, let your health care provider know about all the medical conditions and surgeries you have or have had.  This is a safe procedure. However, some problems may occur, including damage to nearby structures or organs, bleeding, blood clots, or allergies.  Follow your health care provider's instructions about eating, drinking, medicines, and other lifestyle changes, such as quitting tobacco use before the procedure. This information is not intended to replace advice given to you by your health care provider. Make sure you discuss any questions you have with your health care provider. Document Revised: 11/26/2018 Document Reviewed: 11/26/2018 Elsevier Patient Education  [place].

## 2020-07-27 NOTE — H&P (View-Only) (Signed)
Cardiology Office Note:    Date:  07/27/2020   ID:  Lonnie Skinner, DOB March 04, 1961, MRN [id number]  PCP:  Cleophas Dunker, [facility]  Cardiologist:  Ena Dawley, MD  Advanced Practice Provider:  No care team member to display Electrophysiologist:  None    Referring MD: Cleophas Dunker, *    History of Present Illness:    Lonnie Skinner is a 60 y.o. male with a hx of CAD/NSTEMI s/p DES to [facility] with residual moderate LAD disease in 3810, chronic diastolic heart failure, HTN, HLD, CKD who was previously followed seen by Dr. [name] who now returns to clinic for follow-up.  Admitted back in early 2015 with CAD/NSTEMI - s/p DES to the mRCA, residual moderate LAD disease (if fails on medical therapy would proceed with PCI). He has typically not followed CV risk factor modification.   Last saw Dr. [name] on 07/02/19, where he was doing well. No changes in his medications at that time.   Today, the patient states that he has been grieving the loss of his wife who developed COVID-19 pneumonia and stroke in 05/2020. After this, the patient became weak, dehydrated and fell. Was seen in the ER and was given IV fluid with improvement. He also noticed that he has been having chest discomfort with associated shortness of breath when taking the dog outside to walk. Has been ongoing for the past couple of months. Goes away with rest. No symptoms with rest. No significant LE edema, orthopnea, PND, palpitations, diaphoresis, nausea. He is not sure what his medications are but states that he fills and takes all his medications as prescribed.   History is difficult to obtain, however, family is involved in his care and will be available to help coordinate medications and catheterization.   Past Medical History:  Diagnosis Date  . CAD (coronary artery disease)    a. 05/2013: NSTEMI s/p DES to mRCA (culprit vessel), residual moderate LAD disease (the patient fails  medical therapy and this was found to be significant, this would be amenable to PCI).  . CHF (congestive heart failure) ([place])   . CKD (chronic kidney disease), stage II   . DVT (deep venous thrombosis) ([place]) 1981  . Hypercholesteremia   . Hyperglycemia   . Hypertension     Past Surgical History:  Procedure Laterality Date  . CORONARY STENT PLACEMENT  05/2013   mRCA  . CORONARY/GRAFT ACUTE MI REVASCULARIZATION  2015  . KNEE SURGERY Left 2009  . LEFT HEART CATHETERIZATION WITH CORONARY ANGIOGRAM N/A 06/02/2013   Procedure: LEFT HEART CATHETERIZATION WITH CORONARY ANGIOGRAM;  Surgeon: [name], MD;  Location: [facility] CATH LAB;  Service: Cardiovascular;  Laterality: N/A;  . PERCUTANEOUS CORONARY STENT INTERVENTION (PCI-S)  06/02/2013   Procedure: PERCUTANEOUS CORONARY STENT INTERVENTION (PCI-S);  Surgeon: [name], MD;  Location[facility] C[facility] LAB;  Service: Cardiovascular;;    Current Medications: Current Meds  Medication Sig  . ALPRAZolam (XANAX) 0.25 MG tablet TAKE 1 TABLET(0.25 MG) BY MOUTH TWICE DAILY AS NEEDED FOR ANXIETY  . aspirin 81 MG EC tablet Take 1 tablet (81 mg total) by mouth daily.  Marland Kitchen atorvastatin (LIPITOR) 40 MG tablet Take 1 tablet (40 mg total) by mouth every evening.  . carvedilol (COREG) 12.5 MG tablet Take 1 tablet (12.5 mg total) by mouth 2 (two) times daily.  . clopidogrel (PLAVIX) 75 MG tablet Take 1 tablet (75 mg total) by mouth daily.  . clotrimazole-betamethasone (  LOTRISONE) cream APPLY TO THE AFFECTED AREA TWICE DAILY  . furosemide (LASIX) 40 MG tablet Take 1 tablet (40 mg total) by mouth daily.  . isosorbide mononitrate (IMDUR) 30 MG 24 hr tablet TAKE 1 TABLET BY MOUTH DAILY, PLEASE SCHEDULE OVERDUE APPOINTMENT FOR FUTURE REFILLS 1ST ATTEMPT  . nitroGLYCERIN (NITROSTAT) 0.4 MG SL tablet Place 1 tablet (0.4 mg total) under the tongue every 5 (five) minutes as needed for chest pain (up to 3 doses).  . triamcinolone cream (KENALOG) 0.1 % APPLY TOPICALLY  TO THE AFFECTED AREA TWICE DAILY     Allergies:   Patient has no known allergies.   Social History   Socioeconomic History  . Marital status: Married    Spouse name: Not on file  . Number of children: Not on file  . Years of education: Not on file  . Highest education level: Not on file  Occupational History    Comment: Disabled  Tobacco Use  . Smoking status: Former Research scientist (life sciences)  . Smokeless tobacco: Never Used  . Tobacco comment: Quit ~2007  Substance and Sexual Activity  . Alcohol use: No  . Drug use: No  . Sexual activity: Not Currently  Other Topics Concern  . Not on file  Social History Narrative  . Not on file   Social Determinants of Health   Financial Resource Strain: Not on file  Food Insecurity: Not on file  Transportation Needs: Not on file  Physical Activity: Not on file  Stress: Not on file  Social Connections: Not on file     Family History: The patient's family history includes CAD in an other family member; Cancer in his father; Heart attack in his brother and mother; Heart disease in his brother and mother; Heart failure in his mother; Stroke in his mother.  ROS:   Please see the history of present illness.    Review of Systems  Constitutional: Negative for chills and fever.  HENT: Negative for hearing loss.   Eyes: Negative for blurred vision and redness.  Respiratory: Positive for shortness of breath.   Cardiovascular: Positive for chest pain. Negative for palpitations, orthopnea, claudication, leg swelling and PND.  Gastrointestinal: Negative for melena, nausea and vomiting.  Genitourinary: Negative for dysuria and flank pain.  Neurological: Negative for dizziness and loss of consciousness.  Endo/Heme/Allergies: Negative for polydipsia.  Psychiatric/Behavioral: Positive for depression.    EKGs/Labs/Other Studies Reviewed:    The following studies were reviewed today: 06/18/13 ECHO Study Conclusions  Left ventricle: The cavity size was  normal. There was mild focal basal hypertrophy of the septum. Systolic function was normal. The estimated ejection fraction was in the range of 55% to 60%. Wall motion was normal; there were no regional wall motion abnormalities. Features are consistent with a pseudonormal left ventricular filling pattern, with concomitant abnormal relaxation and increased filling pressure (grade 2 diastolic dysfunction).  Impressions:  - No prior study for comparison.  ANGIOGRAPHIC DATA:The left main coronary artery is absent. There appear to be separate ostia of the LAD and circumflex.  The left anterior descending artery is a large vessel which reaches the apex. In the mid vessel, there is a 50-70% lesion in the mid LAD. There is mild to moderate diffuse disease. There 2 large diagonals which are widely patent. The apical LAD is small but patent.  The left circumflex artery is a medium size vessel. There appears to be some vasospasm proximally. There is a large first marginal which is patent. The second marginal is a  larger vessel which branches across the lateral wall. This appears widely patent.  The right coronary artery is a large dominant vessel. In the mid vessel, there is a focal 99% stenosis with visible thrombus. The posterior lateral artery is very large and widely patent. The posterior descending artery is widely patent.  LEFT VENTRICULOGRAM:Left ventricular angiogram was not done. LVEDP was 13 mmHg.  PCI NARRATIVE: A JR 4 guiding catheters u used to engage the RCA. Angiomax was used for anticoagulation. Initially, a pro-water wire was advanced but would not cross the lesion. A Fielder XT density cross the lesion in the mid right coronary artery. A 2.5 x 12 balloon was used to predilate the lesion. A 2.75 x 16 promise drug-eluting stent was then deployed. The stent was post dilated with a 3.25 x 12 noncompliant balloon. There is an excellent angiographic result. There is no residual stenosis. There  were several areas of vasospasm during intervention noted in the distal right and posterior lateral artery. Both resolved with intracoronary nitroglycerin.  EKG:  EKG 06/17/20 NSR with early r-wave progression   Recent Labs: 06/16/2020: ALT 41; BUN 17; Creatinine, Ser 1.10; Hemoglobin 13.6; Platelets 84; Potassium 4.7; Sodium 141  Recent Lipid Panel    Component Value Date/Time   CHOL 102 03/09/2020 1459   TRIG 90 03/09/2020 1459   HDL 39 (L) 03/09/2020 1459   CHOLHDL 2.6 03/09/2020 1459   CHOLHDL 3.1 02/13/2016 1540   VLDL 21 02/13/2016 1540   LDLCALC 45 03/09/2020 1459     Risk Assessment/Calculations:       Physical Exam:    VS:  BP 140/90 (BP Location: Left Arm, Patient Position: Sitting, Cuff Size: Normal)   Pulse 65   Ht 5\' 8"  (1.727 m)   Wt 195 lb (88.5 kg)   SpO2 98%   BMI 29.65 kg/m     Wt Readings from Last 3 Encounters:  07/27/20 195 lb (88.5 kg)  03/09/20 205 lb 4 oz (93.1 kg)  04/22/19 208 lb 8 oz (94.6 kg)     GEN: Comfortable, NAD HEENT: Normal NECK: No JVD; No carotid bruits CARDIAC: RRR, no murmurs, rubs, gallops RESPIRATORY:  Clear to auscultation without rales, wheezing or rhonchi  ABDOMEN: Soft, non-tender, non-distended MUSCULOSKELETAL:  Trace edema. Chronic venous stasis changes. SKIN: Warm and dry NEUROLOGIC:  Alert and oriented x 3. Tangential in thought process but answering questions appropriately PSYCHIATRIC:  Tangential in thought process  ASSESSMENT:    1. Coronary artery disease of native artery of native heart with stable angina pectoris ([place])   2. Pre-procedure lab exam   3. Essential hypertension   4. CKD (chronic kidney disease) stage 2, GFR 60-89 ml/min   5. Hyperlipidemia, unspecified hyperlipidemia type    PLAN:    In order of problems listed above:  #Known CAD with NSTEMI treated with DES to the mid RCA 2015  #Exertional Chest Pain: Patient with history of NSTEMIn in 2015 s/p DES to mid-RCA with known residual  moderate LAD disease. Now with chest pressure when walking the dog with associated shortness of breath. Symptoms resolve with sitting down and resting and do not occur while at rest. Concern for interval progression of known CAD. Will proceed with coronary angiography. Extensive time spent with the patient today regarding plan for cath and coordinating with family. -Plan for coronary angiography -Continue plavix 75mg  daily -Continue aspirin 81mg  -Continue carvedilol  12.5mg  BID -Continue atorvastatin 40mg  daily -Continue imdur 30mg  daily  #Essential hypertension: Elevated today. Does not  monitor at home.  -Continue coreg 12.5mg  BID -Continue imdur 30mg  daily -Will see in follow-up and adjust medications as needed post-cath  #Chronic diastolic CHF: Stable and euvolemic on examination. No significant LE edema. -Continue lasix 40mg  daily -Plan for ischemic work-up as above  #CKD: Cr 1.1 with GFR >60 on most recent labs. -Judicious with contrast during cath  #HLD: -Continue atorvastatin 40mg  as above   Shared Decision Making/Informed Consent The risks [stroke (1 in 1000), death (1 in 1000), kidney failure [usually temporary] (1 in 500), bleeding (1 in 200), allergic reaction [possibly serious] (1 in 200)], benefits (diagnostic support and management of coronary artery disease) and alternatives of a cardiac catheterization were discussed in detail with Mr. Eidem and he is willing to proceed.   Medication Adjustments/Labs and Tests Ordered: Current medicines are reviewed at length with the patient today.  Concerns regarding medicines are outlined above.  Orders Placed This Encounter  Procedures  . Basic metabolic panel  . CBC   No orders of the defined types were placed in this encounter.   Patient Instructions   Medication Instructions:  Your physician recommends that you continue on your current medications as directed. Please refer to the Current Medication list given to you  today.  *If you need a refill on your cardiac medications before your next appointment, please call your pharmacy*   Lab Work: Pre procedure labs today: BMET & CBC If you have labs (blood work) drawn today and your tests are completely normal, you will receive your results only by: Marland Kitchen MyChart Message (if you have MyChart) OR . A paper copy in the mail If you have any lab test that is abnormal or we need to change your treatment, we will call you to review the results.   Testing/Procedures: Your physician has requested that you have a cardiac catheterization. Cardiac catheterization is used to diagnose and/or treat various heart conditions. Doctors may recommend this procedure for a number of different reasons. The most common reason is to evaluate chest pain. Chest pain can be a symptom of coronary artery disease (CAD), and cardiac catheterization can show whether plaque is narrowing or blocking your heart's arteries. This procedure is also used to evaluate the valves, as well as measure the blood flow and oxygen levels in different parts of your heart. For further information please visit HugeFiesta[facility][email or web address]. Please follow instructions below located under "other instructions".   Follow-Up: At [facility], you and your health needs are our priority.  As part of our continuing mission to provide you with exceptional heart care, we have created designated Provider Care Teams.  These Care Teams include your primary Cardiologist (physician) and Advanced Practice Providers (APPs -  Physician Assistants and Nurse Practitioners) who all work together to provide you with the care you need, when you need it.   Your next appointment:   To be determined after cath  The format for your next appointment:   In Person  Provider:   Gwyndolyn Kaufman, MD    Thank you for choosing [facility]!!   [phone]   Other Instructions   COVID TEST-- On 07/30/2020 @ 10:30 am - You will go to  [place] for your Covid testing.   This is a drive thru test site, stay in your car and the nurse team will come to your car to test you.  After you are tested please go home and self quarantine until the day of your procedure.  [place] OFFICE [place], [place] [address] Dept: [phone] Loc: [phone]  TOA MIA  07/27/2020  You are scheduled for a Cardiac Catheterization on Tuesday, March 15 with Dr. Peter [name].  1. Please arrive at the [facility] (Main Entrance A) at [facility]: [address] [place], [place] [address] at 8:30 AM (This time is two hours before your procedure to ensure your preparation). Free valet parking service is available.   Special note: Every effort is made to have your procedure done on time. Please understand that emergencies sometimes delay scheduled procedures.  2. Diet: Nothing to eat or drink after midnight the night before procedure.    3. Labs: Today.  You do not need to be fasting.  4. Medication instructions in preparation for your procedure:   Contrast Allergy: No  Hold all your medications the morning of EXCEPT the medications below: On the morning of your procedure, take your Aspirin and Plavix/Clopidogrel and any morning medicines NOT listed above.  You may use sips of water.  5. Plan for one night stay--bring personal belongings. 6. Bring a current list of your medications and current insurance cards. 7. You MUST have a responsible person to drive you home. 8. Someone MUST be with you the first 24 hours after you arrive home or your discharge will be delayed. 9. Please wear clothes that are easy to get on and off and wear slip-on shoes.  Thank you for allowing [place] to care for you!   -- [facility]     Coronary Angiogram With Stent Coronary angiogram  with stent placement is a procedure to widen or open a narrow blood vessel of the heart (coronary artery). Arteries may become blocked by cholesterol buildup (plaques) in the lining of the artery wall. When a coronary artery becomes partially blocked, blood flow to that area decreases. This may lead to chest pain or a heart attack (myocardial infarction). A stent is a small piece of metal that looks like mesh or spring. Stent placement may be done as treatment after a heart attack, or to prevent a heart attack if a blocked artery is found by a coronary angiogram. Let your health care provider know about:  Any allergies you have, including allergies to medicines or contrast dye.  All medicines you are taking, including vitamins, herbs, eye drops, creams, and over-the-counter medicines.  Any problems you or family members have had with anesthetic medicines.  Any blood disorders you have.  Any surgeries you have had.  Any medical conditions you have, including kidney problems or kidney failure.  Whether you are pregnant or may be pregnant.  Whether you are breastfeeding. What are the risks? Generally, this is a safe procedure. However, serious problems may occur, including:  Damage to nearby structures or organs, such as the heart, blood vessels, or kidneys.  A return of blockage.  Bleeding, infection, or bruising at the insertion site.  A collection of blood under the skin (hematoma) at the insertion site.  A blood clot in another part of the body.  Allergic reaction to medicines or dyes.  Bleeding into the abdomen (retroperitoneal bleeding).  Stroke (rare).  Heart attack (rare). What happens before the procedure? Staying hydrated Follow instructions from your health care provider about hydration, which may include:  Up to 2 hours before the procedure - you may continue to drink clear liquids, such as water,  clear fruit juice, black coffee, and plain tea.   Eating and  drinking restrictions Follow instructions from your health care provider about eating and drinking, which may include:  8 hours before the procedure - stop eating heavy meals or foods, such as meat, fried foods, or fatty foods.  6 hours before the procedure - stop eating light meals or foods, such as toast or cereal.  2 hours before the procedure - stop drinking clear liquids. Medicines Ask your health care provider about:  Changing or stopping your regular medicines. This is especially important if you are taking diabetes medicines or blood thinners.  Taking medicines such as aspirin and ibuprofen. These medicines can thin your blood. Do not take these medicines unless your health care provider tells you to take them. ? Generally, aspirin is recommended before a thin tube, called a catheter, is passed through a blood vessel and inserted into the heart (cardiac catheterization).  Taking over-the-counter medicines, vitamins, herbs, and supplements. General instructions  Do not use any products that contain nicotine or tobacco for at least 4 weeks before the procedure. These products include cigarettes, e-cigarettes, and chewing tobacco. If you need help quitting, ask your health care provider.  Plan to have someone take you home from the hospital or clinic.  If you will be going home right after the procedure, plan to have someone with you for 24 hours.  You may have tests and imaging procedures.  Ask your health care provider: ? How your insertion site will be marked. Ask which artery will be used for the procedure. ? What steps will be taken to help prevent infection. These may include:  Removing hair at the insertion site.  Washing skin with a germ-killing soap.  Taking antibiotic medicine. What happens during the procedure?  An IV will be inserted into one of your veins.  Electrodes may be placed on your chest to monitor your heart rate during the procedure.  You will be  given one or more of the following: ? A medicine to help you relax (sedative). ? A medicine to numb the area (local anesthetic) for catheter insertion.  A small incision will be made for catheter insertion.  The catheter will be inserted into an artery using a guide wire. The location may be in your groin, your wrist, or the fold of your arm (near your elbow).  An X-ray procedure (fluoroscopy) will be used to help guide the catheter to the opening of the heart arteries.  A dye will be injected into the catheter. X-rays will be taken. The dye helps to show where any narrowing or blockages are located in the arteries.  Tell your health care provider if you have chest pain or trouble breathing.  A tiny wire will be guided to the blocked spot, and a balloon will be inflated to make the artery wider.  The stent will be expanded to crush the plaques into the wall of the vessel. The stent will hold the area open and improve the blood flow. Most stents have a drug coating to reduce the risk of the stent narrowing over time.  The artery may be made wider using a drill, laser, or other tools that remove plaques.  The catheter will be removed when the blood flow improves. The stent will stay where it was placed, and the lining of the artery will grow over it.  A bandage (dressing) will be placed on the insertion site. Pressure will be applied to stop bleeding.  The IV will be removed. This procedure may vary among health care providers and hospitals.   What happens after the procedure?  Your blood pressure, heart rate, breathing rate, and blood oxygen level will be monitored until you leave the hospital or clinic.  If the procedure is done through the leg, you will lie flat in bed for a few hours or for as long as told by your health care provider. You will be instructed not to bend or cross your legs.  The insertion site and the pulse in your foot or wrist will be checked often.  You may have  more blood tests, X-rays, and a test that records the electrical activity of your heart (electrocardiogram, or ECG).  Do not drive for 24 hours if you were given a sedative during your procedure. Summary  Coronary angiogram with stent placement is a procedure to widen or open a narrowed coronary artery. This is done to treat heart problems.  Before the procedure, let your health care provider know about all the medical conditions and surgeries you have or have had.  This is a safe procedure. However, some problems may occur, including damage to nearby structures or organs, bleeding, blood clots, or allergies.  Follow your health care provider's instructions about eating, drinking, medicines, and other lifestyle changes, such as quitting tobacco use before the procedure. This information is not intended to replace advice given to you by your health care provider. Make sure you discuss any questions you have with your health care provider. Document Revised: 11/26/2018 Document Reviewed: 11/26/2018 Elsevier Patient Education  2021 [place].      Signed, Freada Bergeron, MD  07/27/2020 10:51 AM    [place]

## 2020-07-27 NOTE — Progress Notes (Signed)
Cardiology Office Note:    Date:  07/27/2020   ID:  Lonnie Skinner, DOB March 14, 1961, MRN [id number]  PCP:  Cleophas Dunker, [facility]  Cardiologist:  Ena Dawley, MD  Advanced Practice Provider:  No care team member to display Electrophysiologist:  None    Referring MD: Cleophas Dunker, *    History of Present Illness:    Lonnie Skinner is a 60 y.o. male with a hx of CAD/NSTEMI s/p DES to [facility] with residual moderate LAD disease in 3329, chronic diastolic heart failure, HTN, HLD, CKD who was previously followed seen by Dr. [name] who now returns to clinic for follow-up.  Admitted back in early 2015 with CAD/NSTEMI - s/p DES to the mRCA, residual moderate LAD disease (if fails on medical therapy would proceed with PCI). He has typically not followed CV risk factor modification.   Last saw Dr. [name] on 07/02/19, where he was doing well. No changes in his medications at that time.   Today, the patient states that he has been grieving the loss of his wife who developed COVID-19 pneumonia and stroke in 05/2020. After this, the patient became weak, dehydrated and fell. Was seen in the ER and was given IV fluid with improvement. He also noticed that he has been having chest discomfort with associated shortness of breath when taking the dog outside to walk. Has been ongoing for the past couple of months. Goes away with rest. No symptoms with rest. No significant LE edema, orthopnea, PND, palpitations, diaphoresis, nausea. He is not sure what his medications are but states that he fills and takes all his medications as prescribed.   History is difficult to obtain, however, family is involved in his care and will be available to help coordinate medications and catheterization.   Past Medical History:  Diagnosis Date  . CAD (coronary artery disease)    a. 05/2013: NSTEMI s/p DES to mRCA (culprit vessel), residual moderate LAD disease (the patient fails  medical therapy and this was found to be significant, this would be amenable to PCI).  . CHF (congestive heart failure) ([place])   . CKD (chronic kidney disease), stage II   . DVT (deep venous thrombosis) ([place]) 1981  . Hypercholesteremia   . Hyperglycemia   . Hypertension     Past Surgical History:  Procedure Laterality Date  . CORONARY STENT PLACEMENT  05/2013   mRCA  . CORONARY/GRAFT ACUTE MI REVASCULARIZATION  2015  . KNEE SURGERY Left 2009  . LEFT HEART CATHETERIZATION WITH CORONARY ANGIOGRAM N/A 06/02/2013   Procedure: LEFT HEART CATHETERIZATION WITH CORONARY ANGIOGRAM;  Surgeon: [name], MD;  Location: [facility] CATH LAB;  Service: Cardiovascular;  Laterality: N/A;  . PERCUTANEOUS CORONARY STENT INTERVENTION (PCI-S)  06/02/2013   Procedure: PERCUTANEOUS CORONARY STENT INTERVENTION (PCI-S);  Surgeon: [name], MD;  Location: [facility] CATH LAB;  Service: Cardiovascular;;    Current Medications: Current Meds  Medication Sig  . ALPRAZolam (XANAX) 0.25 MG tablet TAKE 1 TABLET(0.25 MG) BY MOUTH TWICE DAILY AS NEEDED FOR ANXIETY  . aspirin 81 MG EC tablet Take 1 tablet (81 mg total) by mouth daily.  Marland Kitchen atorvastatin (LIPITOR) 40 MG tablet Take 1 tablet (40 mg total) by mouth every evening.  . carvedilol (COREG) 12.5 MG tablet Take 1 tablet (12.5 mg total) by mouth 2 (two) times daily.  . clopidogrel (PLAVIX) 75 MG tablet Take 1 tablet (75 mg total) by mouth daily.  . clotrimazole-betamethasone (  LOTRISONE) cream APPLY TO THE AFFECTED AREA TWICE DAILY  . furosemide (LASIX) 40 MG tablet Take 1 tablet (40 mg total) by mouth daily.  . isosorbide mononitrate (IMDUR) 30 MG 24 hr tablet TAKE 1 TABLET BY MOUTH DAILY, PLEASE SCHEDULE OVERDUE APPOINTMENT FOR FUTURE REFILLS 1ST ATTEMPT  . nitroGLYCERIN (NITROSTAT) 0.4 MG SL tablet Place 1 tablet (0.4 mg total) under the tongue every 5 (five) minutes as needed for chest pain (up to 3 doses).  . triamcinolone cream (KENALOG) 0.1 % APPLY TOPICALLY  TO THE AFFECTED AREA TWICE DAILY     Allergies:   Patient has no known allergies.   Social History   Socioeconomic History  . Marital status: Married    Spouse name: Not on file  . Number of children: Not on file  . Years of education: Not on file  . Highest education level: Not on file  Occupational History    Comment: Disabled  Tobacco Use  . Smoking status: Former Research scientist (life sciences)  . Smokeless tobacco: Never Used  . Tobacco comment: Quit ~2007  Substance and Sexual Activity  . Alcohol use: No  . Drug use: No  . Sexual activity: Not Currently  Other Topics Concern  . Not on file  Social History Narrative  . Not on file   Social Determinants of Health   Financial Resource Strain: Not on file  Food Insecurity: Not on file  Transportation Needs: Not on file  Physical Activity: Not on file  Stress: Not on file  Social Connections: Not on file     Family History: The patient's family history includes CAD in an other family member; Cancer in his father; Heart attack in his brother and mother; Heart disease in his brother and mother; Heart failure in his mother; Stroke in his mother.  ROS:   Please see the history of present illness.    Review of Systems  Constitutional: Negative for chills and fever.  HENT: Negative for hearing loss.   Eyes: Negative for blurred vision and redness.  Respiratory: Positive for shortness of breath.   Cardiovascular: Positive for chest pain. Negative for palpitations, orthopnea, claudication, leg swelling and PND.  Gastrointestinal: Negative for melena, nausea and vomiting.  Genitourinary: Negative for dysuria and flank pain.  Neurological: Negative for dizziness and loss of consciousness.  Endo/Heme/Allergies: Negative for polydipsia.  Psychiatric/Behavioral: Positive for depression.    EKGs/Labs/Other Studies Reviewed:    The following studies were reviewed today: 06/18/13 ECHO Study Conclusions  Left ventricle: The cavity size was  normal. There was mild focal basal hypertrophy of the septum. Systolic function was normal. The estimated ejection fraction was in the range of 55% to 60%. Wall motion was normal; there were no regional wall motion abnormalities. Features are consistent with a pseudonormal left ventricular filling pattern, with concomitant abnormal relaxation and increased filling pressure (grade 2 diastolic dysfunction).  Impressions:  - No prior study for comparison.  ANGIOGRAPHIC DATA:The left main coronary artery is absent. There appear to be separate ostia of the LAD and circumflex.  The left anterior descending artery is a large vessel which reaches the apex. In the mid vessel, there is a 50-70% lesion in the mid LAD. There is mild to moderate diffuse disease. There 2 large diagonals which are widely patent. The apical LAD is small but patent.  The left circumflex artery is a medium size vessel. There appears to be some vasospasm proximally. There is a large first marginal which is patent. The second marginal is a  larger vessel which branches across the lateral wall. This appears widely patent.  The right coronary artery is a large dominant vessel. In the mid vessel, there is a focal 99% stenosis with visible thrombus. The posterior lateral artery is very large and widely patent. The posterior descending artery is widely patent.  LEFT VENTRICULOGRAM:Left ventricular angiogram was not done. LVEDP was 13 mmHg.  PCI NARRATIVE: A JR 4 guiding catheters u used to engage the RCA. Angiomax was used for anticoagulation. Initially, a pro-water wire was advanced but would not cross the lesion. A Fielder XT density cross the lesion in the mid right coronary artery. A 2.5 x 12 balloon was used to predilate the lesion. A 2.75 x 16 promise drug-eluting stent was then deployed. The stent was post dilated with a 3.25 x 12 noncompliant balloon. There is an excellent angiographic result. There is no residual stenosis. There  were several areas of vasospasm during intervention noted in the distal right and posterior lateral artery. Both resolved with intracoronary nitroglycerin.  EKG:  EKG 06/17/20 NSR with early r-wave progression   Recent Labs: 06/16/2020: ALT 41; BUN 17; Creatinine, Ser 1.10; Hemoglobin 13.6; Platelets 84; Potassium 4.7; Sodium 141  Recent Lipid Panel    Component Value Date/Time   CHOL 102 03/09/2020 1459   TRIG 90 03/09/2020 1459   HDL 39 (L) 03/09/2020 1459   CHOLHDL 2.6 03/09/2020 1459   CHOLHDL 3.1 02/13/2016 1540   VLDL 21 02/13/2016 1540   LDLCALC 45 03/09/2020 1459     Risk Assessment/Calculations:       Physical Exam:    VS:  BP 140/90 (BP Location: Left Arm, Patient Position: Sitting, Cuff Size: Normal)   Pulse 65   Ht 5\' 8"  (1.727 m)   Wt 195 lb (88.5 kg)   SpO2 98%   BMI 29.65 kg/m     Wt Readings from Last 3 Encounters:  07/27/20 195 lb (88.5 kg)  03/09/20 205 lb 4 oz (93.1 kg)  04/22/19 208 lb 8 oz (94.6 kg)     GEN: Comfortable, NAD HEENT: Normal NECK: No JVD; No carotid bruits CARDIAC: RRR, no murmurs, rubs, gallops RESPIRATORY:  Clear to auscultation without rales, wheezing or rhonchi  ABDOMEN: Soft, non-tender, non-distended MUSCULOSKELETAL:  Trace edema. Chronic venous stasis changes. SKIN: Warm and dry NEUROLOGIC:  Alert and oriented x 3. Tangential in thought process but answering questions appropriately PSYCHIATRIC:  Tangential in thought process  ASSESSMENT:    1. Coronary artery disease of native artery of native heart with stable angina pectoris ([place])   2. Pre-procedure lab exam   3. Essential hypertension   4. CKD (chronic kidney disease) stage 2, GFR 60-89 ml/min   5. Hyperlipidemia, unspecified hyperlipidemia type    PLAN:    In order of problems listed above:  #Known CAD with NSTEMI treated with DES to the mid RCA 2015  #Exertional Chest Pain: Patient with history of NSTEMIn in 2015 s/p DES to mid-RCA with known residual  moderate LAD disease. Now with chest pressure when walking the dog with associated shortness of breath. Symptoms resolve with sitting down and resting and do not occur while at rest. Concern for interval progression of known CAD. Will proceed with coronary angiography. Extensive time spent with the patient today regarding plan for cath and coordinating with family. -Plan for coronary angiography -Continue plavix 75mg  daily -Continue aspirin 81mg  -Continue carvedilol  12.5mg  BID -Continue atorvastatin 40mg  daily -Continue imdur 30mg  daily  #Essential hypertension: Elevated today. Does not  monitor at home.  -Continue coreg 12.5mg  BID -Continue imdur 30mg  daily -Will see in follow-up and adjust medications as needed post-cath  #Chronic diastolic CHF: Stable and euvolemic on examination. No significant LE edema. -Continue lasix 40mg  daily -Plan for ischemic work-up as above  #CKD: Cr 1.1 with GFR >60 on most recent labs. -Judicious with contrast during cath  #HLD: -Continue atorvastatin 40mg  as above   Shared Decision Making/Informed Consent The risks [stroke (1 in 1000), death (1 in 1000), kidney failure [usually temporary] (1 in 500), bleeding (1 in 200), allergic reaction [possibly serious] (1 in 200)], benefits (diagnostic support and management of coronary artery disease) and alternatives of a cardiac catheterization were discussed in detail with Mr. Depass and he is willing to proceed.   Medication Adjustments/Labs and Tests Ordered: Current medicines are reviewed at length with the patient today.  Concerns regarding medicines are outlined above.  Orders Placed This Encounter  Procedures  . Basic metabolic panel  . CBC   No orders of the defined types were placed in this encounter.   Patient Instructions   Medication Instructions:  Your physician recommends that you continue on your current medications as directed. Please refer to the Current Medication list given to you  today.  *If you need a refill on your cardiac medications before your next appointment, please call your pharmacy*   Lab Work: Pre procedure labs today: BMET & CBC If you have labs (blood work) drawn today and your tests are completely normal, you will receive your results only by: Marland Kitchen MyChart Message (if you have MyChart) OR . A paper copy in the mail If you have any lab test that is abnormal or we need to change your treatment, we will call you to review the results.   Testing/Procedures: Your physician has requested that you have a cardiac catheterization. Cardiac catheterization is used to diagnose and/or treat various heart conditions. Doctors may recommend this procedure for a number of different reasons. The most common reason is to evaluate chest pain. Chest pain can be a symptom of coronary artery disease (CAD), and cardiac catheterization can show whether plaque is narrowing or blocking your heart's arteries. This procedure is also used to evaluate the valves, as well as measure the blood flow and oxygen levels in different parts of your heart. For further information please visit [email or web address].tn. Please follow instructions below located under "other instructions".   Follow-Up: At [facility], you and your health needs are our priority.  As part of our continuing mission to provide you with exceptional heart care, we have created designated Provider Care Teams.  These Care Teams include your primary Cardiologist (physician) and Advanced Practice Providers (APPs -  Physician Assistants and Nurse Practitioners) who all work together to provide you with the care you need, when you need it.   Your next appointment:   To be determined after cath  The format for your next appointment:   In Person  Provider:   Gwyndolyn Kaufman, MD    Thank you for choosing [facility]!!   [phone]   Other Instructions   COVID TEST-- On 07/30/2020 @ 10:30 am - You will go to  [facility] for your Covid testing.   This is a drive thru test site, stay in your car and the nurse team will come to your car to test you.  After you are tested please go home and self quarantine until the day of your procedure.  [facility] OFFICE [place], [place] [address] Dept: [phone] Loc: [phone]  Lonnie Skinner  07/27/2020  You are scheduled for a Cardiac Catheterization on Tuesday, March 15 with Dr. Peter [name].  1. Please arrive at the [facility] (Main Entrance A) at [facility]: [address] [place],  [address] at 8:30 AM (This time is two hours before your procedure to ensure your preparation). Free valet parking service is available.   Special note: Every effort is made to have your procedure done on time. Please understand that emergencies sometimes delay scheduled procedures.  2. Diet: Nothing to eat or drink after midnight the night before procedure.    3. Labs: Today.  You do not need to be fasting.  4. Medication instructions in preparation for your procedure:   Contrast Allergy: No  Hold all your medications the morning of EXCEPT the medications below: On the morning of your procedure, take your Aspirin and Plavix/Clopidogrel and any morning medicines NOT listed above.  You may use sips of water.  5. Plan for one night stay--bring personal belongings. 6. Bring a current list of your medications and current insurance cards. 7. You MUST have a responsible person to drive you home. 8. Someone MUST be with you the first 24 hours after you arrive home or your discharge will be delayed. 9. Please wear clothes that are easy to get on and off and wear slip-on shoes.  Thank you for allowing [place] to care for you!   -- [place] [facility]     Coronary Angiogram With Stent Coronary angiogram  with stent placement is a procedure to widen or open a narrow blood vessel of the heart (coronary artery). Arteries may become blocked by cholesterol buildup (plaques) in the lining of the artery wall. When a coronary artery becomes partially blocked, blood flow to that area decreases. This may lead to chest pain or a heart attack (myocardial infarction). A stent is a small piece of metal that looks like mesh or spring. Stent placement may be done as treatment after a heart attack, or to prevent a heart attack if a blocked artery is found by a coronary angiogram. Let your health care provider know about:  Any allergies you have, including allergies to medicines or contrast dye.  All medicines you are taking, including vitamins, herbs, eye drops, creams, and over-the-counter medicines.  Any problems you or family members have had with anesthetic medicines.  Any blood disorders you have.  Any surgeries you have had.  Any medical conditions you have, including kidney problems or kidney failure.  Whether you are pregnant or may be pregnant.  Whether you are breastfeeding. What are the risks? Generally, this is a safe procedure. However, serious problems may occur, including:  Damage to nearby structures or organs, such as the heart, blood vessels, or kidneys.  A return of blockage.  Bleeding, infection, or bruising at the insertion site.  A collection of blood under the skin (hematoma) at the insertion site.  A blood clot in another part of the body.  Allergic reaction to medicines or dyes.  Bleeding into the abdomen (retroperitoneal bleeding).  Stroke (rare).  Heart attack (rare). What happens before the procedure? Staying hydrated Follow instructions from your health care provider about hydration, which may include:  Up to 2 hours before the procedure - you may continue to drink clear liquids, such as water,  clear fruit juice, black coffee, and plain tea.   Eating and  drinking restrictions Follow instructions from your health care provider about eating and drinking, which may include:  8 hours before the procedure - stop eating heavy meals or foods, such as meat, fried foods, or fatty foods.  6 hours before the procedure - stop eating light meals or foods, such as toast or cereal.  2 hours before the procedure - stop drinking clear liquids. Medicines Ask your health care provider about:  Changing or stopping your regular medicines. This is especially important if you are taking diabetes medicines or blood thinners.  Taking medicines such as aspirin and ibuprofen. These medicines can thin your blood. Do not take these medicines unless your health care provider tells you to take them. ? Generally, aspirin is recommended before a thin tube, called a catheter, is passed through a blood vessel and inserted into the heart (cardiac catheterization).  Taking over-the-counter medicines, vitamins, herbs, and supplements. General instructions  Do not use any products that contain nicotine or tobacco for at least 4 weeks before the procedure. These products include cigarettes, e-cigarettes, and chewing tobacco. If you need help quitting, ask your health care provider.  Plan to have someone take you home from the hospital or clinic.  If you will be going home right after the procedure, plan to have someone with you for 24 hours.  You may have tests and imaging procedures.  Ask your health care provider: ? How your insertion site will be marked. Ask which artery will be used for the procedure. ? What steps will be taken to help prevent infection. These may include:  Removing hair at the insertion site.  Washing skin with a germ-killing soap.  Taking antibiotic medicine. What happens during the procedure?  An IV will be inserted into one of your veins.  Electrodes may be placed on your chest to monitor your heart rate during the procedure.  You will be  given one or more of the following: ? A medicine to help you relax (sedative). ? A medicine to numb the area (local anesthetic) for catheter insertion.  A small incision will be made for catheter insertion.  The catheter will be inserted into an artery using a guide wire. The location may be in your groin, your wrist, or the fold of your arm (near your elbow).  An X-ray procedure (fluoroscopy) will be used to help guide the catheter to the opening of the heart arteries.  A dye will be injected into the catheter. X-rays will be taken. The dye helps to show where any narrowing or blockages are located in the arteries.  Tell your health care provider if you have chest pain or trouble breathing.  A tiny wire will be guided to the blocked spot, and a balloon will be inflated to make the artery wider.  The stent will be expanded to crush the plaques into the wall of the vessel. The stent will hold the area open and improve the blood flow. Most stents have a drug coating to reduce the risk of the stent narrowing over time.  The artery may be made wider using a drill, laser, or other tools that remove plaques.  The catheter will be removed when the blood flow improves. The stent will stay where it was placed, and the lining of the artery will grow over it.  A bandage (dressing) will be placed on the insertion site. Pressure will be applied to stop bleeding.  The IV will be removed. This procedure may vary among health care providers and hospitals.   What happens after the procedure?  Your blood pressure, heart rate, breathing rate, and blood oxygen level will be monitored until you leave the hospital or clinic.  If the procedure is done through the leg, you will lie flat in bed for a few hours or for as long as told by your health care provider. You will be instructed not to bend or cross your legs.  The insertion site and the pulse in your foot or wrist will be checked often.  You may have  more blood tests, X-rays, and a test that records the electrical activity of your heart (electrocardiogram, or ECG).  Do not drive for 24 hours if you were given a sedative during your procedure. Summary  Coronary angiogram with stent placement is a procedure to widen or open a narrowed coronary artery. This is done to treat heart problems.  Before the procedure, let your health care provider know about all the medical conditions and surgeries you have or have had.  This is a safe procedure. However, some problems may occur, including damage to nearby structures or organs, bleeding, blood clots, or allergies.  Follow your health care provider's instructions about eating, drinking, medicines, and other lifestyle changes, such as quitting tobacco use before the procedure. This information is not intended to replace advice given to you by your health care provider. Make sure you discuss any questions you have with your health care provider. Document Revised: 11/26/2018 Document Reviewed: 11/26/2018 Elsevier Patient Education  2021 [place].      Signed, Freada Bergeron, MD  07/27/2020 10:51 AM    [place]

## 2020-07-28 NOTE — Progress Notes (Signed)
Patient was scheduled for pre-procedural testing on 07/30/2020 at the drive thru COVID testing site. Patient previously tested positive for COVID-19 on 06/15/20. Patient unable to be tested due to previous positive COVID test within 90 days per policy. Patient contacted and appointment cancelled. Patient expressed understanding.

## 2020-07-30 ENCOUNTER — Other Ambulatory Visit (HOSPITAL_COMMUNITY): Payer: Medicare Other

## 2020-08-01 ENCOUNTER — Telehealth: Payer: Self-pay | Admitting: *Deleted

## 2020-08-01 NOTE — Telephone Encounter (Signed)
Pt contacted pre-catheterization scheduled at [facility] for: Tuesday August 02, 2020 10:30 AM Verified arrival time and place: [facility]) at: 8:30 AM   No solid food after midnight prior to cath, clear liquids until 5 AM day of procedure.  Hold: Lasix-AM of procedure  Except hold medications AM meds can be  taken pre-cath with sips of water including: ASA 81 mg Plavix 75 mg  Confirmed patient has responsible adult to drive home post procedure and be with patient first 24 hours after arriving home: yes  You are allowed ONE visitor in the waiting room during the time you are at the hospital for your procedure. Both you and your visitor must wear a mask once you enter the hospital.   Reviewed procedure/mask/visitor instructions with patient.   COVID-19 positive 06/14/20, pt denies any symptoms concerning for COVID-19.

## 2020-08-02 ENCOUNTER — Inpatient Hospital Stay (HOSPITAL_COMMUNITY)
Admission: AD | Admit: 2020-08-02 | Discharge: 2020-08-14 | DRG: 234 | Disposition: A | Discharge status: Home / Self Care | Payer: Medicare Other | Attending: Thoracic Surgery (Cardiothoracic Vascular Surgery) | Admitting: Thoracic Surgery (Cardiothoracic Vascular Surgery)

## 2020-08-02 ENCOUNTER — Inpatient Hospital Stay (HOSPITAL_COMMUNITY)
Admission: AD | Disposition: A | Discharge status: Home / Self Care | Payer: Self-pay | Source: Home / Self Care | Attending: Cardiology

## 2020-08-02 ENCOUNTER — Other Ambulatory Visit: Payer: Self-pay

## 2020-08-02 ENCOUNTER — Inpatient Hospital Stay (HOSPITAL_COMMUNITY): Payer: Medicare Other

## 2020-08-02 ENCOUNTER — Encounter (HOSPITAL_COMMUNITY): Payer: Self-pay | Admitting: Cardiology

## 2020-08-02 DIAGNOSIS — I5033 Acute on chronic diastolic (congestive) heart failure: Secondary | ICD-10-CM | POA: Diagnosis not present

## 2020-08-02 DIAGNOSIS — Z951 Presence of aortocoronary bypass graft: Secondary | ICD-10-CM | POA: Diagnosis not present

## 2020-08-02 DIAGNOSIS — N182 Chronic kidney disease, stage 2 (mild): Secondary | ICD-10-CM | POA: Diagnosis present

## 2020-08-02 DIAGNOSIS — D62 Acute posthemorrhagic anemia: Secondary | ICD-10-CM | POA: Diagnosis not present

## 2020-08-02 DIAGNOSIS — R079 Chest pain, unspecified: Secondary | ICD-10-CM | POA: Diagnosis not present

## 2020-08-02 DIAGNOSIS — I130 Hypertensive heart and chronic kidney disease with heart failure and stage 1 through stage 4 chronic kidney disease, or unspecified chronic kidney disease: Secondary | ICD-10-CM | POA: Diagnosis not present

## 2020-08-02 DIAGNOSIS — E13621 Other specified diabetes mellitus with foot ulcer: Secondary | ICD-10-CM | POA: Diagnosis not present

## 2020-08-02 DIAGNOSIS — I471 Supraventricular tachycardia: Secondary | ICD-10-CM | POA: Diagnosis not present

## 2020-08-02 DIAGNOSIS — Z7902 Long term (current) use of antithrombotics/antiplatelets: Secondary | ICD-10-CM | POA: Diagnosis not present

## 2020-08-02 DIAGNOSIS — I088 Other rheumatic multiple valve diseases: Secondary | ICD-10-CM | POA: Diagnosis not present

## 2020-08-02 DIAGNOSIS — E785 Hyperlipidemia, unspecified: Secondary | ICD-10-CM | POA: Diagnosis present

## 2020-08-02 DIAGNOSIS — I200 Unstable angina: Secondary | ICD-10-CM | POA: Diagnosis not present

## 2020-08-02 DIAGNOSIS — F411 Generalized anxiety disorder: Secondary | ICD-10-CM | POA: Diagnosis present

## 2020-08-02 DIAGNOSIS — E782 Mixed hyperlipidemia: Secondary | ICD-10-CM | POA: Diagnosis not present

## 2020-08-02 DIAGNOSIS — F909 Attention-deficit hyperactivity disorder, unspecified type: Secondary | ICD-10-CM | POA: Diagnosis present

## 2020-08-02 DIAGNOSIS — Z87891 Personal history of nicotine dependence: Secondary | ICD-10-CM

## 2020-08-02 DIAGNOSIS — J90 Pleural effusion, not elsewhere classified: Secondary | ICD-10-CM | POA: Diagnosis not present

## 2020-08-02 DIAGNOSIS — Z8249 Family history of ischemic heart disease and other diseases of the circulatory system: Secondary | ICD-10-CM | POA: Diagnosis not present

## 2020-08-02 DIAGNOSIS — Z79899 Other long term (current) drug therapy: Secondary | ICD-10-CM

## 2020-08-02 DIAGNOSIS — Z880 Allergy status to penicillin: Secondary | ICD-10-CM

## 2020-08-02 DIAGNOSIS — Z86718 Personal history of other venous thrombosis and embolism: Secondary | ICD-10-CM | POA: Diagnosis not present

## 2020-08-02 DIAGNOSIS — I361 Nonrheumatic tricuspid (valve) insufficiency: Secondary | ICD-10-CM | POA: Diagnosis present

## 2020-08-02 DIAGNOSIS — Z955 Presence of coronary angioplasty implant and graft: Secondary | ICD-10-CM | POA: Diagnosis not present

## 2020-08-02 DIAGNOSIS — I872 Venous insufficiency (chronic) (peripheral): Secondary | ICD-10-CM | POA: Diagnosis not present

## 2020-08-02 DIAGNOSIS — Z7982 Long term (current) use of aspirin: Secondary | ICD-10-CM

## 2020-08-02 DIAGNOSIS — Z01810 Encounter for preprocedural cardiovascular examination: Secondary | ICD-10-CM | POA: Diagnosis not present

## 2020-08-02 DIAGNOSIS — L97919 Non-pressure chronic ulcer of unspecified part of right lower leg with unspecified severity: Secondary | ICD-10-CM | POA: Diagnosis present

## 2020-08-02 DIAGNOSIS — I10 Essential (primary) hypertension: Secondary | ICD-10-CM | POA: Diagnosis not present

## 2020-08-02 DIAGNOSIS — Z01818 Encounter for other preprocedural examination: Secondary | ICD-10-CM | POA: Diagnosis not present

## 2020-08-02 DIAGNOSIS — Q231 Congenital insufficiency of aortic valve: Secondary | ICD-10-CM | POA: Diagnosis not present

## 2020-08-02 DIAGNOSIS — I25110 Atherosclerotic heart disease of native coronary artery with unstable angina pectoris: Secondary | ICD-10-CM

## 2020-08-02 DIAGNOSIS — I5032 Chronic diastolic (congestive) heart failure: Secondary | ICD-10-CM | POA: Diagnosis present

## 2020-08-02 DIAGNOSIS — N189 Chronic kidney disease, unspecified: Secondary | ICD-10-CM | POA: Diagnosis present

## 2020-08-02 DIAGNOSIS — E7800 Pure hypercholesterolemia, unspecified: Secondary | ICD-10-CM | POA: Diagnosis present

## 2020-08-02 DIAGNOSIS — I25118 Atherosclerotic heart disease of native coronary artery with other forms of angina pectoris: Principal | ICD-10-CM | POA: Diagnosis present

## 2020-08-02 DIAGNOSIS — I252 Old myocardial infarction: Secondary | ICD-10-CM

## 2020-08-02 DIAGNOSIS — E876 Hypokalemia: Secondary | ICD-10-CM | POA: Diagnosis present

## 2020-08-02 DIAGNOSIS — Z8616 Personal history of COVID-19: Secondary | ICD-10-CM

## 2020-08-02 DIAGNOSIS — Z01811 Encounter for preprocedural respiratory examination: Secondary | ICD-10-CM

## 2020-08-02 DIAGNOSIS — J9811 Atelectasis: Secondary | ICD-10-CM | POA: Diagnosis not present

## 2020-08-02 DIAGNOSIS — L97929 Non-pressure chronic ulcer of unspecified part of left lower leg with unspecified severity: Secondary | ICD-10-CM | POA: Diagnosis present

## 2020-08-02 DIAGNOSIS — I214 Non-ST elevation (NSTEMI) myocardial infarction: Secondary | ICD-10-CM | POA: Diagnosis not present

## 2020-08-02 DIAGNOSIS — I509 Heart failure, unspecified: Secondary | ICD-10-CM | POA: Diagnosis not present

## 2020-08-02 DIAGNOSIS — I248 Other forms of acute ischemic heart disease: Secondary | ICD-10-CM | POA: Diagnosis not present

## 2020-08-02 HISTORY — PX: CORONARY PRESSURE/FFR STUDY: CATH118243

## 2020-08-02 HISTORY — PX: LEFT HEART CATH AND CORONARY ANGIOGRAPHY: CATH118249

## 2020-08-02 HISTORY — PX: INTRAVASCULAR PRESSURE WIRE/FFR STUDY: CATH118243

## 2020-08-02 HISTORY — DX: Chest pain, unspecified: R07.9

## 2020-08-02 LAB — ECHOCARDIOGRAM COMPLETE
AR max vel: 2.25 cm2
AV Area VTI: 2.57 cm2
AV Area mean vel: 2.43 cm2
AV Mean grad: 4 mmHg
AV Peak grad: 7.4 mmHg
Ao pk vel: 1.36 m/s
Area-P 1/2: 4.8 cm2
Height: 68 in
MV VTI: 2.39 cm2
S' Lateral: 2.9 cm
Weight: 3120 oz

## 2020-08-02 LAB — PLATELET INHIBITION P2Y12: Platelet Function  P2Y12: 178 [PRU] — ABNORMAL LOW (ref 182–335)

## 2020-08-02 LAB — POCT ACTIVATED CLOTTING TIME: Activated Clotting Time: 315 seconds

## 2020-08-02 SURGERY — LEFT HEART CATH AND CORONARY ANGIOGRAPHY
Anesthesia: LOCAL

## 2020-08-02 MED ORDER — ALPRAZOLAM 0.25 MG PO TABS
0.2500 mg | ORAL_TABLET | Freq: Three times a day (TID) | ORAL | Status: DC | PRN
  Administered 2020-08-02 – 2020-08-08 (×6): 0.25 mg via ORAL
  Filled 2020-08-02 (×6): qty 1

## 2020-08-02 MED ORDER — HEPARIN SODIUM (PORCINE) 1000 UNIT/ML IJ SOLN
INTRAMUSCULAR | Status: AC
  Filled 2020-08-02: qty 1

## 2020-08-02 MED ORDER — SODIUM CHLORIDE 0.9 % WEIGHT BASED INFUSION
3.0000 mL/kg/h | INTRAVENOUS | Status: DC
  Administered 2020-08-02: 3 mL/kg/h via INTRAVENOUS

## 2020-08-02 MED ORDER — SODIUM CHLORIDE 0.9% FLUSH
3.0000 mL | Freq: Two times a day (BID) | INTRAVENOUS | Status: DC
  Administered 2020-08-02 – 2020-08-08 (×11): 3 mL via INTRAVENOUS

## 2020-08-02 MED ORDER — SODIUM CHLORIDE 0.9 % WEIGHT BASED INFUSION: 1.0000 mL/kg/h | INTRAVENOUS | Status: AC

## 2020-08-02 MED ORDER — CARVEDILOL 12.5 MG PO TABS
12.5000 mg | ORAL_TABLET | Freq: Two times a day (BID) | ORAL | Status: DC
  Administered 2020-08-02 – 2020-08-08 (×13): 12.5 mg via ORAL
  Filled 2020-08-02 (×13): qty 1

## 2020-08-02 MED ORDER — FENTANYL CITRATE (PF) 100 MCG/2ML IJ SOLN
INTRAMUSCULAR | Status: DC | PRN
  Administered 2020-08-02: 25 ug via INTRAVENOUS

## 2020-08-02 MED ORDER — SODIUM CHLORIDE 0.9 % IV SOLN: 250.0000 mL | INTRAVENOUS | Status: DC | PRN

## 2020-08-02 MED ORDER — ASPIRIN 81 MG PO CHEW: 81.0000 mg | CHEWABLE_TABLET | ORAL | Status: DC

## 2020-08-02 MED ORDER — ACETAMINOPHEN 325 MG PO TABS
650.0000 mg | ORAL_TABLET | ORAL | Status: DC | PRN
  Administered 2020-08-06: 650 mg via ORAL
  Filled 2020-08-02: qty 2

## 2020-08-02 MED ORDER — VERAPAMIL HCL 2.5 MG/ML IV SOLN
INTRAVENOUS | Status: AC
  Filled 2020-08-02: qty 2

## 2020-08-02 MED ORDER — NITROGLYCERIN 1 MG/10 ML FOR IR/CATH LAB
INTRA_ARTERIAL | Status: AC
  Filled 2020-08-02: qty 10

## 2020-08-02 MED ORDER — SODIUM CHLORIDE 0.9 % WEIGHT BASED INFUSION: 1.0000 mL/kg/h | INTRAVENOUS | Status: DC

## 2020-08-02 MED ORDER — LIDOCAINE HCL (PF) 1 % IJ SOLN
INTRAMUSCULAR | Status: DC | PRN
  Administered 2020-08-02: 2 mL

## 2020-08-02 MED ORDER — HEPARIN SODIUM (PORCINE) 1000 UNIT/ML IJ SOLN
INTRAMUSCULAR | Status: DC | PRN
  Administered 2020-08-02 (×2): 4500 [IU] via INTRAVENOUS

## 2020-08-02 MED ORDER — VERAPAMIL HCL 2.5 MG/ML IV SOLN
INTRAVENOUS | Status: DC | PRN
  Administered 2020-08-02: 10 mL via INTRA_ARTERIAL

## 2020-08-02 MED ORDER — SODIUM CHLORIDE 0.9% FLUSH: 3.0000 mL | INTRAVENOUS | Status: DC | PRN

## 2020-08-02 MED ORDER — FUROSEMIDE 40 MG PO TABS
40.0000 mg | ORAL_TABLET | Freq: Every day | ORAL | Status: DC
  Administered 2020-08-02 – 2020-08-08 (×7): 40 mg via ORAL
  Filled 2020-08-02 (×7): qty 1

## 2020-08-02 MED ORDER — FENTANYL CITRATE (PF) 100 MCG/2ML IJ SOLN
INTRAMUSCULAR | Status: AC
  Filled 2020-08-02: qty 2

## 2020-08-02 MED ORDER — ISOSORBIDE MONONITRATE ER 30 MG PO TB24
30.0000 mg | ORAL_TABLET | Freq: Every day | ORAL | Status: DC
  Administered 2020-08-03 – 2020-08-08 (×6): 30 mg via ORAL
  Filled 2020-08-02 (×6): qty 1

## 2020-08-02 MED ORDER — MIDAZOLAM HCL 2 MG/2ML IJ SOLN
INTRAMUSCULAR | Status: DC | PRN
  Administered 2020-08-02: 2 mg via INTRAVENOUS

## 2020-08-02 MED ORDER — NITROGLYCERIN 0.4 MG SL SUBL: 0.4000 mg | SUBLINGUAL_TABLET | SUBLINGUAL | Status: DC | PRN

## 2020-08-02 MED ORDER — HEPARIN (PORCINE) 25000 UT/250ML-% IV SOLN
1200.0000 [IU]/h | INTRAVENOUS | Status: DC
  Administered 2020-08-02: 1050 [IU]/h via INTRAVENOUS
  Administered 2020-08-03 – 2020-08-08 (×6): 1200 [IU]/h via INTRAVENOUS
  Filled 2020-08-02 (×8): qty 250

## 2020-08-02 MED ORDER — HEPARIN (PORCINE) IN NACL 1000-0.9 UT/500ML-% IV SOLN
INTRAVENOUS | Status: AC
  Filled 2020-08-02: qty 1500

## 2020-08-02 MED ORDER — HEPARIN (PORCINE) IN NACL 1000-0.9 UT/500ML-% IV SOLN
INTRAVENOUS | Status: DC | PRN
  Administered 2020-08-02 (×2): 500 mL

## 2020-08-02 MED ORDER — IOHEXOL 350 MG/ML SOLN
INTRAVENOUS | Status: DC | PRN
  Administered 2020-08-02: 100 mL

## 2020-08-02 MED ORDER — ONDANSETRON HCL 4 MG/2ML IJ SOLN: 4.0000 mg | Freq: Four times a day (QID) | INTRAMUSCULAR | Status: DC | PRN

## 2020-08-02 MED ORDER — MIDAZOLAM HCL 2 MG/2ML IJ SOLN
INTRAMUSCULAR | Status: AC
  Filled 2020-08-02: qty 2

## 2020-08-02 MED ORDER — SODIUM CHLORIDE 0.9% FLUSH
3.0000 mL | Freq: Two times a day (BID) | INTRAVENOUS | Status: DC
  Administered 2020-08-02 – 2020-08-09 (×12): 3 mL via INTRAVENOUS

## 2020-08-02 MED ORDER — ASPIRIN EC 81 MG PO TBEC
81.0000 mg | DELAYED_RELEASE_TABLET | Freq: Every day | ORAL | Status: DC
  Administered 2020-08-03 – 2020-08-08 (×6): 81 mg via ORAL
  Filled 2020-08-02 (×6): qty 1

## 2020-08-02 MED ORDER — SODIUM CHLORIDE 0.9% FLUSH
3.0000 mL | INTRAVENOUS | Status: DC | PRN
Start: 2020-08-02 — End: 2020-08-09

## 2020-08-02 MED ORDER — LIDOCAINE HCL (PF) 1 % IJ SOLN
INTRAMUSCULAR | Status: AC
  Filled 2020-08-02: qty 30

## 2020-08-02 MED ORDER — ATORVASTATIN CALCIUM 40 MG PO TABS
40.0000 mg | ORAL_TABLET | Freq: Every evening | ORAL | Status: DC
  Administered 2020-08-02: 40 mg via ORAL
  Filled 2020-08-02: qty 1

## 2020-08-02 SURGICAL SUPPLY — 12 items
CATH INFINITI JR4 5F (CATHETERS) ×1 IMPLANT
CATH LAUNCHER 6FR JR4 (CATHETERS) ×1 IMPLANT
CATH OPTITORQUE TIG 4.0 5F (CATHETERS) ×1 IMPLANT
DEVICE RAD COMP TR BAND LRG (VASCULAR PRODUCTS) ×1 IMPLANT
GLIDESHEATH SLEND SS 6F .021 (SHEATH) ×1 IMPLANT
GUIDEWIRE INQWIRE 1.5J.035X260 (WIRE) IMPLANT
GUIDEWIRE PRESSURE X 175 (WIRE) ×1 IMPLANT
INQWIRE 1.5J .035X260CM (WIRE) ×2
KIT HEART LEFT (KITS) ×2 IMPLANT
PACK CARDIAC CATHETERIZATION (CUSTOM PROCEDURE TRAY) ×2 IMPLANT
TRANSDUCER W/STOPCOCK (MISCELLANEOUS) ×2 IMPLANT
TUBING CIL FLEX 10 FLL-RA (TUBING) ×2 IMPLANT

## 2020-08-02 NOTE — Interval H&P Note (Signed)
History and Physical Interval Note:  08/02/2020 11:10 AM  Lonnie Skinner  has presented today for surgery, with the diagnosis of cad.  The various methods of treatment have been discussed with the patient and family. After consideration of risks, benefits and other options for treatment, the patient has consented to  Procedure(s): LEFT HEART CATH AND CORONARY ANGIOGRAPHY (N/A) as a surgical intervention.  The patient's history has been reviewed, patient examined, no change in status, stable for surgery.  I have reviewed the patient's chart and labs.  Questions were answered to the patient's satisfaction.   Cath Lab Visit (complete for each Cath Lab visit)  Clinical Evaluation Leading to the Procedure:   ACS: No.  Non-ACS:    Anginal Classification: CCS III  Anti-ischemic medical therapy: Maximal Therapy (2 or more classes of medications)  Non-Invasive Test Results: No non-invasive testing performed  Prior CABG: No previous CABG        Collier Salina [facility] 08/02/2020 11:10 AM

## 2020-08-02 NOTE — Plan of Care (Signed)
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
## 2020-08-02 NOTE — Progress Notes (Signed)
ANTICOAGULATION CONSULT NOTE - Initial Consult  Pharmacy Consult for IV Heparin Indication: chest pain/ACS  Allergies  Allergen Reactions  . Penicillins     Unknown reaction    Patient Measurements: Height: 5\' 8"  (172.7 cm) Weight: 88.5 kg (195 lb) IBW/kg (Calculated) : 68.4 Heparin Dosing Weight:  86.4 kg  Vital Signs: Temp: 98.3 F (36.8 C) (03/15 0904) Temp Source: Oral (03/15 0904) BP: 129/54 (03/15 1440) Pulse Rate: 69 (03/15 1440)  Labs: No results for input(s): HGB, HCT, PLT, APTT, LABPROT, INR, HEPARINUNFRC, HEPRLOWMOCWT, CREATININE, CKTOTAL, CKMB, TROPONINIHS in the last 72 hours.  Estimated Creatinine Clearance: 74.5 mL/min (by C-G formula based on SCr of 1.14 mg/dL).   Medical History: Past Medical History:  Diagnosis Date  . CAD (coronary artery disease)    a. 05/2013: NSTEMI s/p DES to mRCA (culprit vessel), residual moderate LAD disease (the patient fails medical therapy and this was found to be significant, this would be amenable to PCI).  . CHF (congestive heart failure) ([place])   . CKD (chronic kidney disease), stage II   . DVT (deep venous thrombosis) ([place]) 1981  . Hypercholesteremia   . Hyperglycemia   . Hypertension     Assessment: 60 yr old man with hx of CAD/NSTEMI, S/P DES in 1103; chronic diastolic HF; HTN; HLD; CKD, now with chest pressure associated with SOB when walking his dog. Pt is S/P cardiac cath today, which showed 3-vessel obstructive CAD, including critical 95% proximal LAD. TCTS has been consulted for CABG evaluation. Pharmacy is consulted to dose IV heparin 8 hrs after sheath removal (per cath lab procedure report, sheath removed ~11:45 AM); per med rec, pt was on no anticoagulants PTA.  Labs (07/27/20):  H/H 14.3/42.0, plt 210; Scr 1.14  Goal of Therapy:  Heparin level 0.3-0.7 units/ml Monitor platelets by anticoagulation protocol: Yes   Plan:  Start heparin infusion (no bolus) at 1050 units/hr at 2000 (~8 hrs after sheath  removal) Check heparin level 6 hrs after starting heparin infusion Monitor daily heparin level, CBC Monitor for bleeding F/U CABG plans  Gillermina Hu, PharmD, BCPS, [facility] Clinical Pharmacist 08/02/2020,2:50 PM

## 2020-08-02 NOTE — Progress Notes (Signed)
TCTS consulted for CABG evaluation. °

## 2020-08-02 NOTE — Progress Notes (Signed)
  Echocardiogram 2D Echocardiogram has been performed.  Lonnie Skinner 08/02/2020, 4:57 PM

## 2020-08-03 ENCOUNTER — Encounter (HOSPITAL_COMMUNITY): Payer: Self-pay | Admitting: Cardiology

## 2020-08-03 DIAGNOSIS — N182 Chronic kidney disease, stage 2 (mild): Secondary | ICD-10-CM | POA: Diagnosis not present

## 2020-08-03 DIAGNOSIS — Z87891 Personal history of nicotine dependence: Secondary | ICD-10-CM

## 2020-08-03 DIAGNOSIS — I200 Unstable angina: Secondary | ICD-10-CM

## 2020-08-03 DIAGNOSIS — I10 Essential (primary) hypertension: Secondary | ICD-10-CM | POA: Diagnosis not present

## 2020-08-03 DIAGNOSIS — E782 Mixed hyperlipidemia: Secondary | ICD-10-CM

## 2020-08-03 DIAGNOSIS — R079 Chest pain, unspecified: Secondary | ICD-10-CM | POA: Diagnosis not present

## 2020-08-03 LAB — HEPARIN LEVEL (UNFRACTIONATED)
Heparin Unfractionated: 0.17 IU/mL — ABNORMAL LOW (ref 0.30–0.70)
Heparin Unfractionated: 0.48 IU/mL (ref 0.30–0.70)
Heparin Unfractionated: 0.49 IU/mL (ref 0.30–0.70)

## 2020-08-03 LAB — BASIC METABOLIC PANEL
Anion gap: 9 (ref 5–15)
BUN: 20 mg/dL (ref 6–20)
CO2: 22 mmol/L (ref 22–32)
Calcium: 8.3 mg/dL — ABNORMAL LOW (ref 8.9–10.3)
Chloride: 105 mmol/L (ref 98–111)
Creatinine, Ser: 1.2 mg/dL (ref 0.61–1.24)
GFR, Estimated: >60 mL/min (ref >60)
Glucose, Bld: 131 mg/dL — ABNORMAL HIGH (ref 70–99)
Potassium: 3.5 mmol/L (ref 3.5–5.1)
Sodium: 136 mmol/L (ref 135–145)

## 2020-08-03 LAB — CBC
HCT: 37.4 % — ABNORMAL LOW (ref 39.0–52.0)
Hemoglobin: 12.8 g/dL — ABNORMAL LOW (ref 13.0–17.0)
MCH: 31.5 pg (ref 26.0–34.0)
MCHC: 34.2 g/dL (ref 30.0–36.0)
MCV: 92.1 fL (ref 80.0–100.0)
Platelets: 189 10*3/uL (ref 150–400)
RBC: 4.06 MIL/uL — ABNORMAL LOW (ref 4.22–5.81)
RDW: 14.2 % (ref 11.5–15.5)
WBC: 7.1 10*3/uL (ref 4.0–10.5)
nRBC: 0 % (ref 0.0–0.2)

## 2020-08-03 MED ORDER — ATORVASTATIN CALCIUM 80 MG PO TABS
80.0000 mg | ORAL_TABLET | Freq: Every evening | ORAL | Status: DC
  Administered 2020-08-03 – 2020-08-08 (×6): 80 mg via ORAL
  Filled 2020-08-03 (×6): qty 1

## 2020-08-03 MED FILL — Nitroglycerin IV Soln 100 MCG/ML in D5W: INTRA_ARTERIAL | Qty: 10 | Status: AC

## 2020-08-03 NOTE — Plan of Care (Signed)

## 2020-08-03 NOTE — Progress Notes (Signed)
[facility] for IV Heparin Indication: chest pain/ACS  Allergies  Allergen Reactions  . Penicillins     Unknown reaction    Patient Measurements: Height: 5\' 8"  (172.7 cm) Weight: 86.2 kg (190 lb 1.6 oz) IBW/kg (Calculated) : 68.4 Heparin Dosing Weight:  86.4 kg  Vital Signs: Temp: 97.8 F (36.6 C) (03/16 0358) Temp Source: Oral (03/16 0358) BP: 104/59 (03/16 0358) Pulse Rate: 69 (03/16 0358)  Labs: Recent Labs    08/03/20 0207 08/03/20 1217  HGB 12.8*  --   HCT 37.4*  --   PLT 189  --   HEPARINUNFRC 0.17* 0.48  CREATININE 1.20  --     Estimated Creatinine Clearance: 69.9 mL/min (by C-G formula based on SCr of 1.2 mg/dL).   Medical History: Past Medical History:  Diagnosis Date  . CAD (coronary artery disease)    a. 05/2013: NSTEMI s/p DES to mRCA (culprit vessel), residual moderate LAD disease (the patient fails medical therapy and this was found to be significant, this would be amenable to PCI).  . CHF (congestive heart failure) ([place])   . CKD (chronic kidney disease), stage II   . DVT (deep venous thrombosis) ([place]) 1981  . Hypercholesteremia   . Hyperglycemia   . Hypertension     Assessment: 60 yr old man with hx of CAD/NSTEMI, S/P DES in 3817; chronic diastolic HF; HTN; HLD; CKD, now with chest pressure associated with SOB when walking his dog. Pt is S/P cardiac cath , which showed 3-vessel obstructive CAD, including critical 95% proximal LAD. TCTS has been consulted for CABG evaluation. Pharmacy is consulted to dose IV heparin.  -Heparin level at goal    Goal of Therapy:  Heparin level 0.3-0.7 units/ml Monitor platelets by anticoagulation protocol: Yes   Plan:  Continue heparin at 1200 units/hr Recheck heparin level today F/U CABG plans  Hildred Laser, PharmD Clinical Pharmacist **Pharmacist phone directory can now be found on [email or web address] ([email or web address]).  Listed under [place].

## 2020-08-03 NOTE — Progress Notes (Signed)
ANTICOAGULATION CONSULT NOTE - Follow Up Consult  Pharmacy Consult for IV Heparin Indication: chest pain/ACS  Allergies  Allergen Reactions  . Penicillins     Unknown reaction    Patient Measurements: Height: 5\' 8"  (172.7 cm) Weight: 86.2 kg (190 lb 1.6 oz) IBW/kg (Calculated) : 68.4 Heparin Dosing Weight:  86.4 kg  Vital Signs: Temp: 97.7 F (36.5 C) (03/16 1509) Temp Source: Oral (03/16 1509) BP: 151/79 (03/16 1755) Pulse Rate: 75 (03/16 1755)  Labs: Recent Labs    08/03/20 0207 08/03/20 1217 08/03/20 1803  HGB 12.8*  --   --   HCT 37.4*  --   --   PLT 189  --   --   HEPARINUNFRC 0.17* 0.48 0.49  CREATININE 1.20  --   --     Estimated Creatinine Clearance: 69.9 mL/min (by C-G formula based on SCr of 1.2 mg/dL).   Medical History: Past Medical History:  Diagnosis Date  . CAD (coronary artery disease)    a. 05/2013: NSTEMI s/p DES to mRCA (culprit vessel), residual moderate LAD disease (the patient fails medical therapy and this was found to be significant, this would be amenable to PCI).  . CHF (congestive heart failure) ([place])   . CKD (chronic kidney disease), stage II   . DVT (deep venous thrombosis) ([place]) 1981  . Hypercholesteremia   . Hyperglycemia   . Hypertension     Assessment: 60 yr old man with hx of CAD/NSTEMI, S/P DES in 0355; chronic diastolic HF; HTN; HLD; CKD, now with chest pressure associated with SOB when walking his dog. Pt is S/P cardiac cath, which showed 3-vessel obstructive CAD, including critical 95% proximal LAD. TCTS has been consulted for CABG evaluation. Pharmacy was consulted to dose IV heparin.   Confirmatory heparin level drawn ~6 hrs after first therapeutic heparin level remains therapeutic at 0.49 units/ml on heparin infusion at 1200 units/hr. H/H 12.8/38.4, plt 181. Per RN, no issues with IV or bleeding observed.  Goal of Therapy:  Heparin level 0.3-0.7 units/ml Monitor platelets by anticoagulation protocol: Yes   Plan:   Continue heparin infusion at 1200 units/hr Monitor daily heparin level, CBC Monitor for bleeding F/U CABG plans  Gillermina Hu, PharmD, BCPS, [facility] Clinical Pharmacist

## 2020-08-03 NOTE — Progress Notes (Signed)
[place] for IV Heparin Indication: chest pain/ACS  Allergies  Allergen Reactions  . Penicillins     Unknown reaction    Patient Measurements: Height: 5\' 8"  (172.7 cm) Weight: 88.5 kg (195 lb) IBW/kg (Calculated) : 68.4 Heparin Dosing Weight:  86.4 kg  Vital Signs: Temp: 98.6 F (37 C) (03/16 0018) Temp Source: Oral (03/16 0018) BP: 98/62 (03/16 0018) Pulse Rate: 66 (03/16 0018)  Labs: Recent Labs    08/03/20 0207  HGB 12.8*  HCT 37.4*  PLT 189  HEPARINUNFRC 0.17*  CREATININE 1.20    Estimated Creatinine Clearance: 70.7 mL/min (by C-G formula based on SCr of 1.2 mg/dL).   Medical History: Past Medical History:  Diagnosis Date  . CAD (coronary artery disease)    a. 05/2013: NSTEMI s/p DES to mRCA (culprit vessel), residual moderate LAD disease (the patient fails medical therapy and this was found to be significant, this would be amenable to PCI).  . CHF (congestive heart failure) ([place])   . CKD (chronic kidney disease), stage II   . DVT (deep venous thrombosis) ([place]) 1981  . Hypercholesteremia   . Hyperglycemia   . Hypertension     Assessment: 60 yr old man with hx of CAD/NSTEMI, S/P DES in 2595; chronic diastolic HF; HTN; HLD; CKD, now with chest pressure associated with SOB when walking his dog. Pt is S/P cardiac cath today, which showed 3-vessel obstructive CAD, including critical 95% proximal LAD. TCTS has been consulted for CABG evaluation. Pharmacy is consulted to dose IV heparin 8 hrs after sheath removal (per cath lab procedure report, sheath removed ~11:45 AM); per med rec, pt was on no anticoagulants PTA.  Labs (07/27/20):  H/H 14.3/42.0, plt 210; Scr 1.14  3/16 AM update:  Heparin level low No issues per RN  Goal of Therapy:  Heparin level 0.3-0.7 units/ml Monitor platelets by anticoagulation protocol: Yes   Plan:  Inc heparin to 1200 units/hr 1200 heparin level Monitor daily heparin level, CBC Monitor  for bleeding F/U CABG plans  Narda Bonds, PharmD, BCPS Clinical Pharmacist Phone: [phone]

## 2020-08-03 NOTE — Progress Notes (Addendum)
Progress Note  Patient Name: Lonnie Skinner Date of Encounter: 08/03/2020  [facility] Cardiologist: Ena Dawley, MD/[place]   Subjective   No chest pain during the night.  Dr. Kipp Brood has seen this AM   Inpatient Medications    Scheduled Meds: . aspirin EC  81 mg Oral Daily  . atorvastatin  40 mg Oral QPM  . carvedilol  12.5 mg Oral BID WC  . furosemide  40 mg Oral Daily  . isosorbide mononitrate  30 mg Oral Daily  . sodium chloride flush  3 mL Intravenous Q12H  . sodium chloride flush  3 mL Intravenous Q12H   Continuous Infusions: . sodium chloride    . heparin 1,200 Units/hr (08/03/20 0322)   PRN Meds: sodium chloride, acetaminophen, ALPRAZolam, nitroGLYCERIN, ondansetron (ZOFRAN) IV, sodium chloride flush   Vital Signs    Vitals:   08/02/20 1734 08/02/20 1958 08/03/20 0018 08/03/20 0358  BP: 140/74 121/75 98/62 (!) 104/59  Pulse:  62 66 69  Resp:  18 18 18   Temp:  98.3 F (36.8 C) 98.6 F (37 C) 97.8 F (36.6 C)  TempSrc:  Oral Oral Oral  SpO2:  98% 96% 96%  Weight:    86.2 kg  Height:        Intake/Output Summary (Last 24 hours) at 08/03/2020 0749 Last data filed at 08/03/2020 0528 Gross per 24 hour  Intake 610 ml  Output 4750 ml  Net -4140 ml   Last 3 Weights 08/03/2020 08/02/2020 07/27/2020  Weight (lbs) 190 lb 1.6 oz 195 lb 195 lb  Weight (kg) 86.229 kg 88.451 kg 88.451 kg      Telemetry    SR - Personally Reviewed  ECG    No new - Personally Reviewed  Physical Exam   GEN: No acute distress.   Neck: No JVD Cardiac: RRR, no murmurs, rubs, or gallops.  Respiratory: Clear to auscultation bilaterally. GI: Soft, nontender, non-distended  MS: No edema; No deformity. Neuro:  Nonfocal  Psych: Normal affect   Labs    High Sensitivity Troponin:  No results for input(s): TROPONINIHS in the last 720 hours.    Chemistry Recent Labs  Lab 07/27/20 1052 08/03/20 0207  NA 142 136  K 4.7 3.5  CL 104 105  CO2 25 22  GLUCOSE 95 131*   BUN 20 20  CREATININE 1.14 1.20  CALCIUM 9.3 8.3*  GFRNONAA  --  >60  ANIONGAP  --  9     Hematology Recent Labs  Lab 07/27/20 1052 08/03/20 0207  WBC 7.3 7.1  RBC 4.60 4.06*  HGB 14.3 12.8*  HCT 42.0 37.4*  MCV 91 92.1  MCH 31.1 31.5  MCHC 34.0 34.2  RDW 13.7 14.2  PLT 210 189    BNPNo results for input(s): BNP, PROBNP in the last 168 hours.   DDimer No results for input(s): DDIMER in the last 168 hours.   Radiology    CARDIAC CATHETERIZATION  Result Date: 08/02/2020  Prox LAD lesion is 95% stenosed.  Prox LAD to Mid LAD lesion is 80% stenosed.  1st Mrg lesion is 75% stenosed.  2nd Mrg lesion is 60% stenosed.  Prox RCA to Mid RCA lesion is 70% stenosed.  Previously placed Mid RCA-1 stent (unknown type) is widely patent.  Mid RCA-2 lesion is 50% stenosed.  The left ventricular systolic function is normal.  LV end diastolic pressure is mildly elevated.  The left ventricular ejection fraction is 55-65% by visual estimate.  1. 3 vessel  obstructive CAD    - critical 95% proximal LAD    - 70% OM1    - 60% OM2    - 70% mid RCA proximal to prior stent with abnormal RFR 2. Normal LV function 3. Mildly elevated LVEDP Plan: recommend CABG. Will hold Plavix. Admit to telemetry and treat with IV heparin. Check Echo.   ECHOCARDIOGRAM COMPLETE  Result Date: 08/02/2020    ECHOCARDIOGRAM REPORT   Patient Name:   Lonnie Skinner Date of Exam: 08/02/2020 Medical Rec #:  [id number]     Height:       68.0 in Accession #:    [id number]    Weight:       195.0 lb Date of Birth:  [birth year]    BSA:          2.022 m Patient Age:    60 years      BP:           132/88 mmHg Patient Gender: M             HR:           63 bpm. Exam Location:  Inpatient Procedure: 2D Echo, Cardiac Doppler and Color Doppler Indications:    Acute Ischemic Heart Disease  History:        Patient has prior history of Echocardiogram examinations, most                 recent 06/18/2013. Previous Myocardial Infarction and CAD;  Risk                 Factors:Hypertension, Dyslipidemia and Former Smoker.  Sonographer:    Clayton Lefort RDCS (AE) Referring Phys: [id number] [name] [name] IMPRESSIONS  1. Left ventricular ejection fraction, by estimation, is 60 to 65%. The left ventricle has normal function. The left ventricle has no regional wall motion abnormalities. Left ventricular diastolic parameters were normal.  2. Right ventricular systolic function is normal. The right ventricular size is normal. Tricuspid regurgitation signal is inadequate for assessing PA pressure.  3. The mitral valve is normal in structure. No evidence of mitral valve regurgitation. No evidence of mitral stenosis.  4. The aortic valve is normal in structure. Aortic valve regurgitation is not visualized. No aortic stenosis is present.  5. The inferior vena cava is normal in size with greater than 50% respiratory variability, suggesting right atrial pressure of 3 mmHg. FINDINGS  Left Ventricle: Left ventricular ejection fraction, by estimation, is 60 to 65%. The left ventricle has normal function. The left ventricle has no regional wall motion abnormalities. The left ventricular internal cavity size was normal in size. There is  borderline concentric left ventricular hypertrophy. Left ventricular diastolic parameters were normal. Normal left ventricular filling pressure. Right Ventricle: The right ventricular size is normal. No increase in right ventricular wall thickness. Right ventricular systolic function is normal. Tricuspid regurgitation signal is inadequate for assessing PA pressure. Left Atrium: Left atrial size was normal in size. Right Atrium: Right atrial size was normal in size. Pericardium: There is no evidence of pericardial effusion. Mitral Valve: The mitral valve is normal in structure. No evidence of mitral valve regurgitation. No evidence of mitral valve stenosis. MV peak gradient, 3.0 mmHg. The mean mitral valve gradient is 1.0 mmHg. Tricuspid Valve: The  tricuspid valve is normal in structure. Tricuspid valve regurgitation is not demonstrated. No evidence of tricuspid stenosis. Aortic Valve: The aortic valve is normal in structure. Aortic valve regurgitation is not visualized. No aortic stenosis  is present. Aortic valve mean gradient measures 4.0 mmHg. Aortic valve peak gradient measures 7.4 mmHg. Aortic valve area, by VTI measures 2.57 cm. Pulmonic Valve: The pulmonic valve was normal in structure. Pulmonic valve regurgitation is not visualized. No evidence of pulmonic stenosis. Aorta: The aortic root is normal in size and structure. Venous: The inferior vena cava is normal in size with greater than 50% respiratory variability, suggesting right atrial pressure of 3 mmHg. IAS/Shunts: No atrial level shunt detected by color flow Doppler.  LEFT VENTRICLE PLAX 2D LVIDd:         4.00 cm  Diastology LVIDs:         2.90 cm  LV e' medial:    7.94 cm/s LV PW:         1.30 cm  LV E/e' medial:  9.6 LV IVS:        1.20 cm  LV e' lateral:   9.36 cm/s LVOT diam:     2.00 cm  LV E/e' lateral: 8.1 LV SV:         69 LV SV Index:   34 LVOT Area:     3.14 cm  RIGHT VENTRICLE             IVC RV Basal diam:  3.00 cm     IVC diam: 1.90 cm RV S prime:     11.60 cm/s TAPSE (M-mode): 2.3 cm LEFT ATRIUM             Index       RIGHT ATRIUM           Index LA diam:        3.00 cm 1.48 cm/m  RA Area:     11.30 cm LA Vol (A2C):   34.1 ml 16.87 ml/m RA Volume:   26.40 ml  13.06 ml/m LA Vol (A4C):   32.3 ml 15.98 ml/m LA Biplane Vol: 35.0 ml 17.31 ml/m  AORTIC VALVE AV Area (Vmax):    2.25 cm AV Area (Vmean):   2.43 cm AV Area (VTI):     2.57 cm AV Vmax:           136.00 cm/s AV Vmean:          95.700 cm/s AV VTI:            0.269 m AV Peak Grad:      7.4 mmHg AV Mean Grad:      4.0 mmHg LVOT Vmax:         97.60 cm/s LVOT Vmean:        74.100 cm/s LVOT VTI:          0.220 m LVOT/AV VTI ratio: 0.82  AORTA Ao Root diam: 3.00 cm Ao Asc diam:  3.00 cm MITRAL VALVE MV Area (PHT): 4.80 cm     SHUNTS MV Area VTI:   2.39 cm    Systemic VTI:  0.22 m MV Peak grad:  3.0 mmHg    Systemic Diam: 2.00 cm MV Mean grad:  1.0 mmHg MV Vmax:       0.86 m/s MV Vmean:      43.1 cm/s MV Decel Time: 158 msec MV E velocity: 76.10 cm/s MV A velocity: 55.20 cm/s MV E/A ratio:  1.38 Mihai Croitoru MD Electronically signed by Sanda Klein MD Signature Date/Time: 08/02/2020/5:13:25 PM    Final     Cardiac Studies   Cardiac cath 08/02/20  Prox LAD lesion is 95% stenosed.  Prox LAD to Mid LAD  lesion is 80% stenosed.  1st Mrg lesion is 75% stenosed.  2nd Mrg lesion is 60% stenosed.  Prox RCA to Mid RCA lesion is 70% stenosed.  Previously placed Mid RCA-1 stent (unknown type) is widely patent.  Mid RCA-2 lesion is 50% stenosed.  The left ventricular systolic function is normal.  LV end diastolic pressure is mildly elevated.  The left ventricular ejection fraction is 55-65% by visual estimate.   1. 3 vessel obstructive CAD    - critical 95% proximal LAD    - 70% OM1    - 60% OM2    - 70% mid RCA proximal to prior stent with abnormal RFR 2. Normal LV function 3. Mildly elevated LVEDP  Plan: recommend CABG. Will hold Plavix. Admit to telemetry and treat with IV heparin. Check Echo.   Echo 08/02/20 IMPRESSIONS    1. Left ventricular ejection fraction, by estimation, is 60 to 65%. The  left ventricle has normal function. The left ventricle has no regional  wall motion abnormalities. Left ventricular diastolic parameters were  normal.  2. Right ventricular systolic function is normal. The right ventricular  size is normal. Tricuspid regurgitation signal is inadequate for assessing  PA pressure.  3. The mitral valve is normal in structure. No evidence of mitral valve  regurgitation. No evidence of mitral stenosis.  4. The aortic valve is normal in structure. Aortic valve regurgitation is  not visualized. No aortic stenosis is present.  5. The inferior vena cava is normal in size  with greater than 50%  respiratory variability, suggesting right atrial pressure of 3 mmHg.   FINDINGS  Left Ventricle: Left ventricular ejection fraction, by estimation, is 60  to 65%. The left ventricle has normal function. The left ventricle has no  regional wall motion abnormalities. The left ventricular internal cavity  size was normal in size. There is  borderline concentric left ventricular hypertrophy. Left ventricular  diastolic parameters were normal. Normal left ventricular filling  pressure.   Right Ventricle: The right ventricular size is normal. No increase in  right ventricular wall thickness. Right ventricular systolic function is  normal. Tricuspid regurgitation signal is inadequate for assessing PA  pressure.   Left Atrium: Left atrial size was normal in size.   Right Atrium: Right atrial size was normal in size.   Pericardium: There is no evidence of pericardial effusion.   Mitral Valve: The mitral valve is normal in structure. No evidence of  mitral valve regurgitation. No evidence of mitral valve stenosis. MV peak  gradient, 3.0 mmHg. The mean mitral valve gradient is 1.0 mmHg.   Tricuspid Valve: The tricuspid valve is normal in structure. Tricuspid  valve regurgitation is not demonstrated. No evidence of tricuspid  stenosis.   Aortic Valve: The aortic valve is normal in structure. Aortic valve  regurgitation is not visualized. No aortic stenosis is present. Aortic  valve mean gradient measures 4.0 mmHg. Aortic valve peak gradient measures  7.4 mmHg. Aortic valve area, by VTI  measures 2.57 cm.   Pulmonic Valve: The pulmonic valve was normal in structure. Pulmonic valve  regurgitation is not visualized. No evidence of pulmonic stenosis.   Aorta: The aortic root is normal in size and structure.   Venous: The inferior vena cava is normal in size with greater than 50%  respiratory variability, suggesting right atrial pressure of 3 mmHg.   IAS/Shunts:  No atrial level shunt detected by color flow Doppler.      Patient Profile  60 y.o. male with hx of CAD/NSTEMI s/p DES to [facility] with residual moderate LAD disease in 6659, chronic diastolic HF, HTN, HLD, CKD now with with cath for angina, associated SOB,  Cath with significant CAD and is being evaluated for CABG.   Assessment & Plan    Angina/CAD with hx NSTEMI and DES to mRCA 2015 with patent stent on cath but 70% stenosis distal and prox to stent - on IV heparin -eval for CABG- Dr Kipp Brood to see prob surgery on Tuesday  -plavix held and P2Y12 is 178. Continue ASA, BB, statin and imdur, also on lasix -EF normal on echo.   HTN  -controlled  Chronic diastolic CHF -on lasix   HLD continue statin  CKD -2  GFR today >60  For questions or updates, please contact [place] Please consult [email or web address] for contact info under     Signed, Cecilie Kicks, NP  08/03/2020, 7:49 AM    The patient was seen, examined and discussed with Cecilie Kicks, NP and I agree with the above.   60 year old patient with known CAD, who underwent diagnostic cath for unstable angina yesterday that showed patent stent in mid RCA but 70% stenosis distal and proximal to the stent, patient is scheduled for bypass surgery on Tuesday, Plavix was held, his last P2 Y 12 is 178.  We will continue aspirin, carvedilol, isosorbide, p.o. Lasix, increase atorvastatin to 80 mg daily.  Labs are otherwise normal including creatinine electrolytes hemoglobin and platelet count.  Cardiogram shows normal LVEF 60 to 65% with no regional wall motion abnormalities  Ena Dawley, MD 08/03/2020

## 2020-08-04 DIAGNOSIS — R079 Chest pain, unspecified: Secondary | ICD-10-CM | POA: Diagnosis not present

## 2020-08-04 DIAGNOSIS — Z87891 Personal history of nicotine dependence: Secondary | ICD-10-CM | POA: Diagnosis not present

## 2020-08-04 DIAGNOSIS — I10 Essential (primary) hypertension: Secondary | ICD-10-CM | POA: Diagnosis not present

## 2020-08-04 DIAGNOSIS — E782 Mixed hyperlipidemia: Secondary | ICD-10-CM | POA: Diagnosis not present

## 2020-08-04 LAB — CBC
HCT: 37.7 % — ABNORMAL LOW (ref 39.0–52.0)
Hemoglobin: 13.4 g/dL (ref 13.0–17.0)
MCH: 32.3 pg (ref 26.0–34.0)
MCHC: 35.5 g/dL (ref 30.0–36.0)
MCV: 90.8 fL (ref 80.0–100.0)
Platelets: 203 10*3/uL (ref 150–400)
RBC: 4.15 MIL/uL — ABNORMAL LOW (ref 4.22–5.81)
RDW: 14 % (ref 11.5–15.5)
WBC: 7.4 10*3/uL (ref 4.0–10.5)
nRBC: 0 % (ref 0.0–0.2)

## 2020-08-04 LAB — HEPARIN LEVEL (UNFRACTIONATED): Heparin Unfractionated: 0.48 IU/mL (ref 0.30–0.70)

## 2020-08-04 NOTE — Progress Notes (Signed)
ANTICOAGULATION CONSULT NOTE - Follow Up Consult  Pharmacy Consult for IV Heparin Indication: chest pain/ACS  Allergies  Allergen Reactions  . Penicillins     Unknown reaction    Patient Measurements: Height: 5\' 8"  (172.7 cm) Weight: 87.5 kg (192 lb 14.4 oz) IBW/kg (Calculated) : 68.4 Heparin Dosing Weight:  86.4 kg  Vital Signs: Temp: 98.2 F (36.8 C) (03/17 1031) Temp Source: Oral (03/17 1031) BP: 140/86 (03/17 1031) Pulse Rate: 74 (03/17 1031)  Labs: Recent Labs    08/03/20 0207 08/03/20 1217 08/03/20 1803 08/04/20 0207  HGB 12.8*  --   --  13.4  HCT 37.4*  --   --  37.7*  PLT 189  --   --  203  HEPARINUNFRC 0.17* 0.48 0.49 0.48  CREATININE 1.20  --   --   --     Estimated Creatinine Clearance: 70.4 mL/min (by C-G formula based on SCr of 1.2 mg/dL).   Medical History: Past Medical History:  Diagnosis Date  . CAD (coronary artery disease)    a. 05/2013: NSTEMI s/p DES to mRCA (culprit vessel), residual moderate LAD disease (the patient fails medical therapy and this was found to be significant, this would be amenable to PCI).  . CHF (congestive heart failure) ([place])   . CKD (chronic kidney disease), stage II   . DVT (deep venous thrombosis) ([place]) 1981  . Hypercholesteremia   . Hyperglycemia   . Hypertension     Assessment: 60 yr old man with hx of CAD/NSTEMI, S/P DES in 5859; chronic diastolic HF; HTN; HLD; CKD, now with chest pressure associated with SOB when walking his dog. Pt is S/P cardiac cath, which showed 3-vessel obstructive CAD, including critical 95% proximal LAD. TCTS has been consulted for CABG evaluation. Pharmacy was consulted to dose IV heparin. Plans for CABG next week -heparin level and goal -CBC stable  Goal of Therapy:  Heparin level 0.3-0.7 units/ml Monitor platelets by anticoagulation protocol: Yes   Plan:  Continue heparin infusion at 1200 units/hr Monitor daily heparin level, CBC  Hildred Laser, PharmD Clinical  Pharmacist **Pharmacist phone directory can now be found on [email or web address] ([email or web address]).  Listed under [place].

## 2020-08-04 NOTE — Plan of Care (Signed)
  Problem: Activity: Goal: Risk for activity intolerance will decrease Outcome: Progressing   Problem: Nutrition: Goal: Adequate nutrition will be maintained Outcome: Progressing   Problem: Coping: Goal: Level of anxiety will decrease Outcome: Progressing   Problem: Elimination: Goal: Will not experience complications related to bowel motility Outcome: Progressing   

## 2020-08-04 NOTE — Progress Notes (Signed)
[address]       [place] [address]             [phone]      2 Days Post-Op Procedure(s) (LRB): LEFT HEART CATH AND CORONARY ANGIOGRAPHY (N/A) INTRAVASCULAR PRESSURE WIRE/FFR STUDY (N/A) Subjective: Denies CP or SOB, comfortable while eating breakfast  Objective: Vital signs in last 24 hours: Temp:  [97.7 F (36.5 C)-98.5 F (36.9 C)] 98.5 F (36.9 C) (03/17 0526) Pulse Rate:  [65-76] 70 (03/17 0526) Cardiac Rhythm: Normal sinus rhythm (03/17 0754) Resp:  [15-17] 17 (03/17 0526) BP: (114-151)/(73-91) 114/86 (03/17 0526) SpO2:  [92 %-100 %] 98 % (03/17 0526) Weight:  [87.5 kg] 87.5 kg (03/17 0526)  Hemodynamic parameters for last 24 hours:    Intake/Output from previous day: 03/16 0701 - 03/17 0700 In: 496.4 [P.O.:250; I.V.:246.4] Out: 3800 [Urine:3800] Intake/Output this shift: No intake/output data recorded.  General appearance: alert, cooperative and no distress Heart: regular rate and rhythm Lungs: clear to auscultation bilaterally  Lab Results: Recent Labs    08/03/20 0207 08/04/20 0207  WBC 7.1 7.4  HGB 12.8* 13.4  HCT 37.4* 37.7*  PLT 189 203   BMET:  Recent Labs    08/03/20 0207  NA 136  K 3.5  CL 105  CO2 22  GLUCOSE 131*  BUN 20  CREATININE 1.20  CALCIUM 8.3*    PT/INR: No results for input(s): LABPROT, INR in the last 72 hours. ABG    Component Value Date/Time   TCO2 30 06/16/2020 1604   CBG (last 3)  No results for input(s): GLUCAP in the last 72 hours.  Meds Scheduled Meds: . aspirin EC  81 mg Oral Daily  . atorvastatin  80 mg Oral QPM  . carvedilol  12.5 mg Oral BID WC  . furosemide  40 mg Oral Daily  . isosorbide mononitrate  30 mg Oral Daily  . sodium chloride flush  3 mL Intravenous Q12H  . sodium chloride flush  3 mL Intravenous Q12H   Continuous Infusions: . sodium chloride    . heparin 1,200 Units/hr (08/03/20 1755)   PRN Meds:.sodium chloride, acetaminophen, ALPRAZolam,  nitroGLYCERIN, ondansetron (ZOFRAN) IV, sodium chloride flush  Xrays CARDIAC CATHETERIZATION  Result Date: 08/02/2020  Prox LAD lesion is 95% stenosed.  Prox LAD to Mid LAD lesion is 80% stenosed.  1st Mrg lesion is 75% stenosed.  2nd Mrg lesion is 60% stenosed.  Prox RCA to Mid RCA lesion is 70% stenosed.  Previously placed Mid RCA-1 stent (unknown type) is widely patent.  Mid RCA-2 lesion is 50% stenosed.  The left ventricular systolic function is normal.  LV end diastolic pressure is mildly elevated.  The left ventricular ejection fraction is 55-65% by visual estimate.  1. 3 vessel obstructive CAD    - critical 95% proximal LAD    - 70% OM1    - 60% OM2    - 70% mid RCA proximal to prior stent with abnormal RFR 2. Normal LV function 3. Mildly elevated LVEDP Plan: recommend CABG. Will hold Plavix. Admit to telemetry and treat with IV heparin. Check Echo.   ECHOCARDIOGRAM COMPLETE  Result Date: 08/02/2020    ECHOCARDIOGRAM REPORT   Patient Name:   Lonnie Skinner Date of Exam: 08/02/2020 Medical Rec #:  [id number]     Height:       68.0 in Accession #:    [id number]    Weight:       195.0  lb Date of Birth:  Sep 22, 1960    BSA:          2.022 m Patient Age:    60 years      BP:           132/88 mmHg Patient Gender: M             HR:           63 bpm. Exam Location:  Inpatient Procedure: 2D Echo, Cardiac Doppler and Color Doppler Indications:    Acute Ischemic Heart Disease  History:        Patient has prior history of Echocardiogram examinations, most                 recent 06/18/2013. Previous Myocardial Infarction and CAD; Risk                 Factors:Hypertension, Dyslipidemia and Former Smoker.  Sonographer:    Clayton Lefort RDCS (AE) Referring Phys: [id number] [name] [name] IMPRESSIONS  1. Left ventricular ejection fraction, by estimation, is 60 to 65%. The left ventricle has normal function. The left ventricle has no regional wall motion abnormalities. Left ventricular diastolic parameters were normal.   2. Right ventricular systolic function is normal. The right ventricular size is normal. Tricuspid regurgitation signal is inadequate for assessing PA pressure.  3. The mitral valve is normal in structure. No evidence of mitral valve regurgitation. No evidence of mitral stenosis.  4. The aortic valve is normal in structure. Aortic valve regurgitation is not visualized. No aortic stenosis is present.  5. The inferior vena cava is normal in size with greater than 50% respiratory variability, suggesting right atrial pressure of 3 mmHg. FINDINGS  Left Ventricle: Left ventricular ejection fraction, by estimation, is 60 to 65%. The left ventricle has normal function. The left ventricle has no regional wall motion abnormalities. The left ventricular internal cavity size was normal in size. There is  borderline concentric left ventricular hypertrophy. Left ventricular diastolic parameters were normal. Normal left ventricular filling pressure. Right Ventricle: The right ventricular size is normal. No increase in right ventricular wall thickness. Right ventricular systolic function is normal. Tricuspid regurgitation signal is inadequate for assessing PA pressure. Left Atrium: Left atrial size was normal in size. Right Atrium: Right atrial size was normal in size. Pericardium: There is no evidence of pericardial effusion. Mitral Valve: The mitral valve is normal in structure. No evidence of mitral valve regurgitation. No evidence of mitral valve stenosis. MV peak gradient, 3.0 mmHg. The mean mitral valve gradient is 1.0 mmHg. Tricuspid Valve: The tricuspid valve is normal in structure. Tricuspid valve regurgitation is not demonstrated. No evidence of tricuspid stenosis. Aortic Valve: The aortic valve is normal in structure. Aortic valve regurgitation is not visualized. No aortic stenosis is present. Aortic valve mean gradient measures 4.0 mmHg. Aortic valve peak gradient measures 7.4 mmHg. Aortic valve area, by VTI measures  2.57 cm. Pulmonic Valve: The pulmonic valve was normal in structure. Pulmonic valve regurgitation is not visualized. No evidence of pulmonic stenosis. Aorta: The aortic root is normal in size and structure. Venous: The inferior vena cava is normal in size with greater than 50% respiratory variability, suggesting right atrial pressure of 3 mmHg. IAS/Shunts: No atrial level shunt detected by color flow Doppler.  LEFT VENTRICLE PLAX 2D LVIDd:         4.00 cm  Diastology LVIDs:         2.90 cm  LV e' medial:  7.94 cm/s LV PW:         1.30 cm  LV E/e' medial:  9.6 LV IVS:        1.20 cm  LV e' lateral:   9.36 cm/s LVOT diam:     2.00 cm  LV E/e' lateral: 8.1 LV SV:         69 LV SV Index:   34 LVOT Area:     3.14 cm  RIGHT VENTRICLE             IVC RV Basal diam:  3.00 cm     IVC diam: 1.90 cm RV S prime:     11.60 cm/s TAPSE (M-mode): 2.3 cm LEFT ATRIUM             Index       RIGHT ATRIUM           Index LA diam:        3.00 cm 1.48 cm/m  RA Area:     11.30 cm LA Vol (A2C):   34.1 ml 16.87 ml/m RA Volume:   26.40 ml  13.06 ml/m LA Vol (A4C):   32.3 ml 15.98 ml/m LA Biplane Vol: 35.0 ml 17.31 ml/m  AORTIC VALVE AV Area (Vmax):    2.25 cm AV Area (Vmean):   2.43 cm AV Area (VTI):     2.57 cm AV Vmax:           136.00 cm/s AV Vmean:          95.700 cm/s AV VTI:            0.269 m AV Peak Grad:      7.4 mmHg AV Mean Grad:      4.0 mmHg LVOT Vmax:         97.60 cm/s LVOT Vmean:        74.100 cm/s LVOT VTI:          0.220 m LVOT/AV VTI ratio: 0.82  AORTA Ao Root diam: 3.00 cm Ao Asc diam:  3.00 cm MITRAL VALVE MV Area (PHT): 4.80 cm    SHUNTS MV Area VTI:   2.39 cm    Systemic VTI:  0.22 m MV Peak grad:  3.0 mmHg    Systemic Diam: 2.00 cm MV Mean grad:  1.0 mmHg MV Vmax:       0.86 m/s MV Vmean:      43.1 cm/s MV Decel Time: 158 msec MV E velocity: 76.10 cm/s MV A velocity: 55.20 cm/s MV E/A ratio:  1.38 Mihai Croitoru MD Electronically signed by Sanda Klein MD Signature Date/Time: 08/02/2020/5:13:25 PM     Final     Assessment/Plan: S/P Procedure(s) (LRB): LEFT HEART CATH AND CORONARY ANGIOGRAPHY (N/A) INTRAVASCULAR PRESSURE WIRE/FFR STUDY (N/A)  1 stable on heparin gtt, plavix washout 2 echo results reviewed, no significant issues 3 plan for surgery early next week unless becomes unstable    LOS: 2 days    John Giovanni PA-C 08/04/2020

## 2020-08-04 NOTE — Progress Notes (Addendum)
CARDIAC REHAB PHASE I   PRE:  Rate/Rhythm: 75 SR    BP: sitting 125/87    SaO2: 97 RA  MODE:  Ambulation: 430 ft   POST:  Rate/Rhythm: 90 SR    BP: sitting 137/90     SaO2: 99 RA  Pt eager to ambulate. Stiff getting out of bed, unsteady initially (has old injuries). Steadier with distance but held to IV pole. Denied CP, wanted to walk farther. To recliner.   Discussed sternal precautions, IS (1750 mL max), mobility post op and d/c planning. Pt easily overwhelmed. Encouraged him to read and view videos. He sts his wife passed recently and his kids cannot stay with him. Discussed the ability for children to take turns or pt going to their house. He continued to state that that would not work. Will f/u tomorrow for more ambulation. Pt would like to walk again this evening. [place], ACSM 08/04/2020 2:57 PM

## 2020-08-04 NOTE — Progress Notes (Addendum)
Progress Note  Patient Name: Lonnie Skinner Date of Encounter: 08/04/2020  [facility] Cardiologist: Ena Dawley, MD   Subjective   No chest pain, no SOB. Some anxiety  Inpatient Medications    Scheduled Meds: . aspirin EC  81 mg Oral Daily  . atorvastatin  80 mg Oral QPM  . carvedilol  12.5 mg Oral BID WC  . furosemide  40 mg Oral Daily  . isosorbide mononitrate  30 mg Oral Daily  . sodium chloride flush  3 mL Intravenous Q12H  . sodium chloride flush  3 mL Intravenous Q12H   Continuous Infusions: . sodium chloride    . heparin 1,200 Units/hr (08/03/20 1755)   PRN Meds: sodium chloride, acetaminophen, ALPRAZolam, nitroGLYCERIN, ondansetron (ZOFRAN) IV, sodium chloride flush   Vital Signs    Vitals:   08/03/20 1755 08/03/20 2249 08/04/20 0049 08/04/20 0526  BP: (!) 151/79 (!) 132/91 115/73 114/86  Pulse: 75 71 65 70  Resp:  16 15 17   Temp:  98 F (36.7 C) 98.3 F (36.8 C) 98.5 F (36.9 C)  TempSrc:  Oral Oral Oral  SpO2:  92% 94% 98%  Weight:    87.5 kg  Height:        Intake/Output Summary (Last 24 hours) at 08/04/2020 0814 Last data filed at 08/04/2020 0543 Gross per 24 hour  Intake 496.4 ml  Output 3800 ml  Net -3303.6 ml   Last 3 Weights 08/04/2020 08/03/2020 08/02/2020  Weight (lbs) 192 lb 14.4 oz 190 lb 1.6 oz 195 lb  Weight (kg) 87.5 kg 86.229 kg 88.451 kg      Telemetry    SR - Personally Reviewed  ECG    No new - Personally Reviewed  Physical Exam   GEN: No acute distress.   Neck: No JVD Cardiac: RRR, no murmurs, rubs, or gallops. Rt wrist cath site without hematoma Respiratory: Clear to auscultation bilaterally. GI: Soft, nontender, non-distended  MS: No edema; No deformity. Neuro:  Nonfocal  Psych: Normal affect   Labs    High Sensitivity Troponin:  No results for input(s): TROPONINIHS in the last 720 hours.    Chemistry Recent Labs  Lab 08/03/20 0207  NA 136  K 3.5  CL 105  CO2 22  GLUCOSE 131*  BUN 20   CREATININE 1.20  CALCIUM 8.3*  GFRNONAA >60  ANIONGAP 9     Hematology Recent Labs  Lab 08/03/20 0207 08/04/20 0207  WBC 7.1 7.4  RBC 4.06* 4.15*  HGB 12.8* 13.4  HCT 37.4* 37.7*  MCV 92.1 90.8  MCH 31.5 32.3  MCHC 34.2 35.5  RDW 14.2 14.0  PLT 189 203    BNPNo results for input(s): BNP, PROBNP in the last 168 hours.   DDimer No results for input(s): DDIMER in the last 168 hours.   Radiology    CARDIAC CATHETERIZATION  Result Date: 08/02/2020  Prox LAD lesion is 95% stenosed.  Prox LAD to Mid LAD lesion is 80% stenosed.  1st Mrg lesion is 75% stenosed.  2nd Mrg lesion is 60% stenosed.  Prox RCA to Mid RCA lesion is 70% stenosed.  Previously placed Mid RCA-1 stent (unknown type) is widely patent.  Mid RCA-2 lesion is 50% stenosed.  The left ventricular systolic function is normal.  LV end diastolic pressure is mildly elevated.  The left ventricular ejection fraction is 55-65% by visual estimate.  1. 3 vessel obstructive CAD    - critical 95% proximal LAD    - 70%  OM1    - 60% OM2    - 70% mid RCA proximal to prior stent with abnormal RFR 2. Normal LV function 3. Mildly elevated LVEDP Plan: recommend CABG. Will hold Plavix. Admit to telemetry and treat with IV heparin. Check Echo.   ECHOCARDIOGRAM COMPLETE  08/02/2020   IMPRESSIONS  1. Left ventricular ejection fraction, by estimation, is 60 to 65%. The left ventricle has normal function. The left ventricle has no regional wall motion abnormalities. Left ventricular diastolic parameters were normal.  2. Right ventricular systolic function is normal. The right ventricular size is normal. Tricuspid regurgitation signal is inadequate for assessing PA pressure.  3. The mitral valve is normal in structure. No evidence of mitral valve regurgitation. No evidence of mitral stenosis.  4. The aortic valve is normal in structure. Aortic valve regurgitation is not visualized. No aortic stenosis is present.  5. The inferior vena cava is  normal in size with greater than 50% respiratory variability, suggesting right atrial pressure of 3 mmHg.  Cardiac Studies   Cardiac cath 08/02/20  Prox LAD lesion is 95% stenosed.  Prox LAD to Mid LAD lesion is 80% stenosed.  1st Mrg lesion is 75% stenosed.  2nd Mrg lesion is 60% stenosed.  Prox RCA to Mid RCA lesion is 70% stenosed.  Previously placed Mid RCA-1 stent (unknown type) is widely patent.  Mid RCA-2 lesion is 50% stenosed.  The left ventricular systolic function is normal.  LV end diastolic pressure is mildly elevated.  The left ventricular ejection fraction is 55-65% by visual estimate.  1. 3 vessel obstructive CAD - critical 95% proximal LAD - 70% OM1 - 60% OM2 - 70% mid RCA proximal to prior stent with abnormal RFR 2. Normal LV function 3. Mildly elevated LVEDP  Plan: recommend CABG. Will hold Plavix. Admit to telemetry and treat with IV heparin. Check Echo.   Echo 08/02/20 IMPRESSIONS    1. Left ventricular ejection fraction, by estimation, is 60 to 65%. The  left ventricle has normal function. The left ventricle has no regional  wall motion abnormalities. Left ventricular diastolic parameters were  normal.  2. Right ventricular systolic function is normal. The right ventricular  size is normal. Tricuspid regurgitation signal is inadequate for assessing  PA pressure.  3. The mitral valve is normal in structure. No evidence of mitral valve  regurgitation. No evidence of mitral stenosis.  4. The aortic valve is normal in structure. Aortic valve regurgitation is  not visualized. No aortic stenosis is present.  5. The inferior vena cava is normal in size with greater than 50%  respiratory variability, suggesting right atrial pressure of 3 mmHg.   FINDINGS  Left Ventricle: Left ventricular ejection fraction, by estimation, is 60  to 65%. The left ventricle has normal function. The left ventricle has no  regional wall motion  abnormalities. The left ventricular internal cavity  size was normal in size. There is  borderline concentric left ventricular hypertrophy. Left ventricular  diastolic parameters were normal. Normal left ventricular filling  pressure.   Right Ventricle: The right ventricular size is normal. No increase in  right ventricular wall thickness. Right ventricular systolic function is  normal. Tricuspid regurgitation signal is inadequate for assessing PA  pressure.   Left Atrium: Left atrial size was normal in size.   Right Atrium: Right atrial size was normal in size.   Pericardium: There is no evidence of pericardial effusion.   Mitral Valve: The mitral valve is normal in structure. No  evidence of  mitral valve regurgitation. No evidence of mitral valve stenosis. MV peak  gradient, 3.0 mmHg. The mean mitral valve gradient is 1.0 mmHg.   Tricuspid Valve: The tricuspid valve is normal in structure. Tricuspid  valve regurgitation is not demonstrated. No evidence of tricuspid  stenosis.   Aortic Valve: The aortic valve is normal in structure. Aortic valve  regurgitation is not visualized. No aortic stenosis is present. Aortic  valve mean gradient measures 4.0 mmHg. Aortic valve peak gradient measures  7.4 mmHg. Aortic valve area, by VTI  measures 2.57 cm.   Pulmonic Valve: The pulmonic valve was normal in structure. Pulmonic valve  regurgitation is not visualized. No evidence of pulmonic stenosis.   Aorta: The aortic root is normal in size and structure.   Venous: The inferior vena cava is normal in size with greater than 50%  respiratory variability, suggesting right atrial pressure of 3 mmHg.   IAS/Shunts: No atrial level shunt detected by color flow Doppler.      Patient Profile     60 y.o. male with hx of CAD/NSTEMI s/p DES to [facility] with residual moderate LAD disease in 5009, chronic diastolic HF, HTN, HLD, CKD now with with cath for angina, associated SOB,  Cath with  significant CAD and is being evaluated for CABG  Assessment & Plan    Angina/CAD with hx NSTEMI and DES to mRCA 2015 with patent stent on cath but 70% stenosis distal and prox to stent - on IV heparin -eval for CABG- Dr Kipp Brood has seen surgery on Tuesday most likely -plavix held and P2Y12 is 178. Continue ASA, BB, statin and imdur, also on lasix -EF normal on echo.  - will let him be up in chair, cardiac rehab to see  HTN  -controlled  Chronic diastolic CHF -on lasix   HLD continue statin  CKD -2  GFR today >60  For questions or updates, please contact [place] Please consult [email or web address] for contact info under     Signed, Cecilie Kicks, NP  08/04/2020, 8:14 AM    The patient was seen, examined and discussed with Cecilie Kicks, NP and I agree with the above.   The patient is awaiting bypass surgery next week, he has no recurrent chest pain, he appears euvolemic, his LVEF is 60 to 65% on echocardiogram with no regional wall motion abnormalities, he is on Plavix washout, will repeat lab tomorrow.  Blood pressure is now at goal.  We will continue aspirin and atorvastatin 80 mg daily, continue heparin drip.  Ena Dawley, MD 08/04/2020

## 2020-08-05 DIAGNOSIS — Z87891 Personal history of nicotine dependence: Secondary | ICD-10-CM | POA: Diagnosis not present

## 2020-08-05 DIAGNOSIS — E782 Mixed hyperlipidemia: Secondary | ICD-10-CM | POA: Diagnosis not present

## 2020-08-05 DIAGNOSIS — R079 Chest pain, unspecified: Secondary | ICD-10-CM | POA: Diagnosis not present

## 2020-08-05 DIAGNOSIS — I10 Essential (primary) hypertension: Secondary | ICD-10-CM | POA: Diagnosis not present

## 2020-08-05 LAB — CBC
HCT: 38.3 % — ABNORMAL LOW (ref 39.0–52.0)
Hemoglobin: 13.6 g/dL (ref 13.0–17.0)
MCH: 31.8 pg (ref 26.0–34.0)
MCHC: 35.5 g/dL (ref 30.0–36.0)
MCV: 89.5 fL (ref 80.0–100.0)
Platelets: 226 10*3/uL (ref 150–400)
RBC: 4.28 MIL/uL (ref 4.22–5.81)
RDW: 13.7 % (ref 11.5–15.5)
WBC: 9 10*3/uL (ref 4.0–10.5)
nRBC: 0 % (ref 0.0–0.2)

## 2020-08-05 LAB — HEPARIN LEVEL (UNFRACTIONATED): Heparin Unfractionated: 0.49 IU/mL (ref 0.30–0.70)

## 2020-08-05 LAB — BASIC METABOLIC PANEL
Anion gap: 7 (ref 5–15)
BUN: 27 mg/dL — ABNORMAL HIGH (ref 6–20)
CO2: 28 mmol/L (ref 22–32)
Calcium: 9.3 mg/dL (ref 8.9–10.3)
Chloride: 101 mmol/L (ref 98–111)
Creatinine, Ser: 1.21 mg/dL (ref 0.61–1.24)
GFR, Estimated: >60 mL/min (ref >60)
Glucose, Bld: 89 mg/dL (ref 70–99)
Potassium: 3.4 mmol/L — ABNORMAL LOW (ref 3.5–5.1)
Sodium: 136 mmol/L (ref 135–145)

## 2020-08-05 LAB — PLATELET INHIBITION P2Y12: Platelet Function  P2Y12: 179 [PRU] — ABNORMAL LOW (ref 182–335)

## 2020-08-05 MED ORDER — POTASSIUM CHLORIDE CRYS ER 20 MEQ PO TBCR
40.0000 meq | EXTENDED_RELEASE_TABLET | Freq: Once | ORAL | Status: AC
  Administered 2020-08-05: 40 meq via ORAL
  Filled 2020-08-05: qty 2

## 2020-08-05 NOTE — Progress Notes (Signed)
      [address]       [place] [address]             [phone]      3 Days Post-Op Procedure(s) (LRB): LEFT HEART CATH AND CORONARY ANGIOGRAPHY (N/A) INTRAVASCULAR PRESSURE WIRE/FFR STUDY (N/A) Subjective:  no CP or SOB  Objective: Vital signs in last 24 hours: Temp:  [97.7 F (36.5 C)-98.2 F (36.8 C)] 97.7 F (36.5 C) (03/18 0350) Pulse Rate:  [66-76] 71 (03/18 0350) Resp:  [16-17] 17 (03/18 0350) BP: (104-140)/(64-86) 124/67 (03/18 0350) SpO2:  [95 %-99 %] 95 % (03/18 0350) Weight:  [86 kg] 86 kg (03/18 0350)  Hemodynamic parameters for last 24 hours:    Intake/Output from previous day: 03/17 0701 - 03/18 0700 In: 411.3 [P.O.:120; I.V.:291.3] Out: 700 [Urine:700] Intake/Output this shift: No intake/output data recorded.  General appearance: alert, cooperative and no distress Heart: regular rate and rhythm Lungs: clear to auscultation bilaterally  Lab Results: Recent Labs    08/04/20 0207 08/05/20 0313  WBC 7.4 9.0  HGB 13.4 13.6  HCT 37.7* 38.3*  PLT 203 226   BMET:  Recent Labs    08/03/20 0207 08/05/20 0313  NA 136 136  K 3.5 3.4*  CL 105 101  CO2 22 28  GLUCOSE 131* 89  BUN 20 27*  CREATININE 1.20 1.21  CALCIUM 8.3* 9.3    PT/INR: No results for input(s): LABPROT, INR in the last 72 hours. ABG    Component Value Date/Time   TCO2 30 06/16/2020 1604   CBG (last 3)  No results for input(s): GLUCAP in the last 72 hours.  Meds Scheduled Meds: . aspirin EC  81 mg Oral Daily  . atorvastatin  80 mg Oral QPM  . carvedilol  12.5 mg Oral BID WC  . furosemide  40 mg Oral Daily  . isosorbide mononitrate  30 mg Oral Daily  . sodium chloride flush  3 mL Intravenous Q12H  . sodium chloride flush  3 mL Intravenous Q12H   Continuous Infusions: . sodium chloride    . heparin 1,200 Units/hr (08/04/20 1637)   PRN Meds:.sodium chloride, acetaminophen, ALPRAZolam, nitroGLYCERIN, ondansetron (ZOFRAN) IV, sodium chloride  flush  Xrays No results found.  Assessment/Plan: S/P Procedure(s) (LRB): LEFT HEART CATH AND CORONARY ANGIOGRAPHY (N/A) INTRAVASCULAR PRESSURE WIRE/FFR STUDY (N/A)  1 afeb, VSS 2 sats good on RA 3 on heparin gtt/plavix wahout 4 clinically stable - surgery early next week     LOS: 3 days    John Giovanni PA-C Pager [phone] 08/05/2020

## 2020-08-05 NOTE — Progress Notes (Signed)
Progress Note  Patient Name: Lonnie Skinner Date of Encounter: 08/05/2020  Primary Cardiologist: Ena Dawley, MD   Subjective   Doing well today with no anginal symptoms. Plan for CABG next week.   Inpatient Medications    Scheduled Meds: . aspirin EC  81 mg Oral Daily  . atorvastatin  80 mg Oral QPM  . carvedilol  12.5 mg Oral BID WC  . furosemide  40 mg Oral Daily  . isosorbide mononitrate  30 mg Oral Daily  . sodium chloride flush  3 mL Intravenous Q12H  . sodium chloride flush  3 mL Intravenous Q12H   Continuous Infusions: . sodium chloride    . heparin 1,200 Units/hr (08/04/20 1637)   PRN Meds: sodium chloride, acetaminophen, ALPRAZolam, nitroGLYCERIN, ondansetron (ZOFRAN) IV, sodium chloride flush   Vital Signs    Vitals:   08/04/20 1031 08/04/20 1823 08/04/20 2117 08/05/20 0350  BP: 140/86 108/76 104/64 124/67  Pulse: 74 76 66 71  Resp: 16  16 17   Temp: 98.2 F (36.8 C)  97.9 F (36.6 C) 97.7 F (36.5 C)  TempSrc: Oral  Oral Oral  SpO2: 99% 97% 98% 95%  Weight:    86 kg  Height:        Intake/Output Summary (Last 24 hours) at 08/05/2020 0826 Last data filed at 08/05/2020 0600 Gross per 24 hour  Intake 411.32 ml  Output 700 ml  Net -288.68 ml   Filed Weights   08/03/20 0358 08/04/20 0526 08/05/20 0350  Weight: 86.2 kg 87.5 kg 86 kg    Physical Exam   General: Well developed, well nourished, NAD Neck: Negative for carotid bruits. No JVD Lungs:Clear to ausculation bilaterally. No wheezes, rales, or rhonchi. Breathing is unlabored. Cardiovascular: RRR with S1 S2. No murmurs Abdomen: Soft, non-tender, non-distended. No obvious abdominal masses. Extremities: No edema. Radial pulses 2+ bilaterally Neuro: Alert and oriented. No focal deficits. No facial asymmetry. MAE spontaneously. Psych: Responds to questions appropriately with normal affect.    Labs    Chemistry Recent Labs  Lab 08/03/20 0207 08/05/20 0313  NA 136 136  K 3.5 3.4*   CL 105 101  CO2 22 28  GLUCOSE 131* 89  BUN 20 27*  CREATININE 1.20 1.21  CALCIUM 8.3* 9.3  GFRNONAA >60 >60  ANIONGAP 9 7     Hematology Recent Labs  Lab 08/03/20 0207 08/04/20 0207 08/05/20 0313  WBC 7.1 7.4 9.0  RBC 4.06* 4.15* 4.28  HGB 12.8* 13.4 13.6  HCT 37.4* 37.7* 38.3*  MCV 92.1 90.8 89.5  MCH 31.5 32.3 31.8  MCHC 34.2 35.5 35.5  RDW 14.2 14.0 13.7  PLT 189 203 226    Cardiac EnzymesNo results for input(s): TROPONINI in the last 168 hours. No results for input(s): TROPIPOC in the last 168 hours.   BNPNo results for input(s): BNP, PROBNP in the last 168 hours.   DDimer No results for input(s): DDIMER in the last 168 hours.   Radiology    No results found.  Telemetry     08/05/20 NSR with rates in the 60-70's- Personally Reviewed  ECG    No new tracing as of 08/05/20- Personally Reviewed  Cardiac Studies    Cardiac cath 08/02/20   Prox LAD lesion is 95% stenosed.  Prox LAD to Mid LAD lesion is 80% stenosed.  1st Mrg lesion is 75% stenosed.  2nd Mrg lesion is 60% stenosed.  Prox RCA to Mid RCA lesion is 70% stenosed.  Previously  placed Mid RCA-1 stent (unknown type) is widely patent.  Mid RCA-2 lesion is 50% stenosed.  The left ventricular systolic function is normal.  LV end diastolic pressure is mildly elevated.  The left ventricular ejection fraction is 55-65% by visual estimate.  1. 3 vessel obstructive CAD - critical 95% proximal LAD - 70% OM1 - 60% OM2 - 70% mid RCA proximal to prior stent with abnormal RFR 2. Normal LV function 3. Mildly elevated LVEDP  Plan: recommend CABG. Will hold Plavix. Admit to telemetry and treat with IV heparin. Check Echo.   Echo 08/02/20  1. Left ventricular ejection fraction, by estimation, is 60 to 65%. The  left ventricle has normal function. The left ventricle has no regional  wall motion abnormalities. Left ventricular diastolic parameters were  normal.  2. Right  ventricular systolic function is normal. The right ventricular  size is normal. Tricuspid regurgitation signal is inadequate for assessing  PA pressure.  3. The mitral valve is normal in structure. No evidence of mitral valve  regurgitation. No evidence of mitral stenosis.  4. The aortic valve is normal in structure. Aortic valve regurgitation is  not visualized. No aortic stenosis is present.  5. The inferior vena cava is normal in size with greater than 50%  respiratory variability, suggesting right atrial pressure of 3 mmHg.   Patient Profile     60 y.o. male with hx of CAD/NSTEMI s/p DES to [facility] with residual moderate LAD disease in 2244, chronic diastolic HF, HTN, HLD, CKD now with with cath for angina, associated SOB, Cath with significant CAD and is being evaluated for CABG  Assessment & Plan    1. CAD with NSTEMI: -s/p DES to mRCA 2015 with patent stent on cath however with 70% stenosis distal and proximal to stent  -Evaluated by Dr. Kipp Brood with plans for CABG next week after Plavix washout -Continue Heparin infusion>>ASA beta blocker, statin and IMDUR -Echo with normal LVEF   2. HTN: -Stable, 124/67>>104/64>>108/76 -Continue carvedilol   3. Chronic diastolic CHF: -Appear euvolemic on exam -Continue with Lasix   4. HLD: -Last LDL, 52 from 06/02/2018 -Continue statin therapy   5. CKD stage II: -Cr, 1.21>>continue to avoid nephrotoxic medication   6. Hypokalemia: -K+ 3.4 today  -Replace with 71meq PO today and follow res   Signed, Kathyrn Drown NP-C [facility] Pager: [phone] 08/05/2020, 8:26 AM     For questions or updates, please contact   Please consult [email or web address] for contact info under Cardiology/STEMI.  The patient was seen, examined and discussed with Kathyrn Drown, NP  and I agree with the above.   The patient is awaiting bypass surgery next week, he has no recurrent chest pain, he appears euvolemic, his LVEF is 60 to 65% on echocardiogram  with no regional wall motion abnormalities, he is on Plavix washout, repeat lab is pending.  Blood pressure is now at goal.  We will continue aspirin and atorvastatin 80 mg daily, continue heparin drip.  He has no signs of fluid overload on physical exam.  Ena Dawley, MD 08/05/2020

## 2020-08-05 NOTE — Progress Notes (Signed)
ANTICOAGULATION CONSULT NOTE - Follow Up Consult  Pharmacy Consult for IV Heparin Indication: chest pain/ACS  Allergies  Allergen Reactions  . Penicillins     Unknown reaction    Patient Measurements: Height: 5\' 8"  (172.7 cm) Weight: 86 kg (189 lb 9.5 oz) IBW/kg (Calculated) : 68.4 Heparin Dosing Weight:  86.4 kg  Vital Signs: Temp: 97.7 F (36.5 C) (03/18 0350) Temp Source: Oral (03/18 0350) BP: 124/67 (03/18 0350) Pulse Rate: 71 (03/18 0350)  Labs: Recent Labs    08/03/20 0207 08/03/20 1217 08/03/20 1803 08/04/20 0207 08/05/20 0313  HGB 12.8*  --   --  13.4 13.6  HCT 37.4*  --   --  37.7* 38.3*  PLT 189  --   --  203 226  HEPARINUNFRC 0.17*   < > 0.49 0.48 0.49  CREATININE 1.20  --   --   --  1.21   < > = values in this interval not displayed.    Estimated Creatinine Clearance: 69.2 mL/min (by C-G formula based on SCr of 1.21 mg/dL).   Medical History: Past Medical History:  Diagnosis Date  . CAD (coronary artery disease)    a. 05/2013: NSTEMI s/p DES to mRCA (culprit vessel), residual moderate LAD disease (the patient fails medical therapy and this was found to be significant, this would be amenable to PCI).  . CHF (congestive heart failure) ([place])   . CKD (chronic kidney disease), stage II   . DVT (deep venous thrombosis) ([place]) 1981  . Hypercholesteremia   . Hyperglycemia   . Hypertension     Assessment: 60 yr old man with hx of CAD/NSTEMI, S/P DES in 2355; chronic diastolic HF; HTN; HLD; CKD, now with chest pressure associated with SOB when walking his dog. Pt is S/P cardiac cath, which showed 3-vessel obstructive CAD, including critical 95% proximal LAD. TCTS has been consulted for CABG evaluation. Pharmacy was consulted to dose IV heparin. Plans for CABG next week -heparin level and goal -CBC stable  Goal of Therapy:  Heparin level 0.3-0.7 units/ml Monitor platelets by anticoagulation protocol: Yes   Plan:  Continue heparin infusion at 1200  units/hr Monitor daily heparin level, CBC  Hildred Laser, PharmD Clinical Pharmacist **Pharmacist phone directory can now be found on [email or web address] ([email or web address]).  Listed under [place].

## 2020-08-05 NOTE — Progress Notes (Signed)
CARDIAC REHAB PHASE I   PRE:  Rate/Rhythm: 80 SR    BP: sitting 124/85    SaO2: 95 RA  MODE:  Ambulation: 1050 ft   POST:  Rate/Rhythm: 95 SR    BP: sitting 114/81     SaO2: 97 RA  Pt stiff getting up. Pushed IV pole and assist with gait belt. [place] with distance and pt was able to walk independently pushing gait belt. He has an old right calf/ankle injury. Increased distance without CP but did rest x2. To recliner. VSS. Had pt practice IS, 1750 mL. Video system not working currently. Encouraged ambulation. [place], ACSM 08/05/2020 2:07 PM

## 2020-08-05 NOTE — Care Management Important Message (Signed)
Important Message  Patient Details  Name: Lonnie Skinner MRN: [id number] Date of Birth: [birth year]   Medicare Important Message Given:  Yes     F[place] [address] 08/05/2020, 12:45 PM

## 2020-08-06 DIAGNOSIS — R079 Chest pain, unspecified: Secondary | ICD-10-CM | POA: Diagnosis not present

## 2020-08-06 DIAGNOSIS — E782 Mixed hyperlipidemia: Secondary | ICD-10-CM | POA: Diagnosis not present

## 2020-08-06 DIAGNOSIS — Z87891 Personal history of nicotine dependence: Secondary | ICD-10-CM | POA: Diagnosis not present

## 2020-08-06 DIAGNOSIS — I10 Essential (primary) hypertension: Secondary | ICD-10-CM | POA: Diagnosis not present

## 2020-08-06 LAB — CBC
HCT: 37.6 % — ABNORMAL LOW (ref 39.0–52.0)
Hemoglobin: 13.4 g/dL (ref 13.0–17.0)
MCH: 32.1 pg (ref 26.0–34.0)
MCHC: 35.6 g/dL (ref 30.0–36.0)
MCV: 90.2 fL (ref 80.0–100.0)
Platelets: 213 10*3/uL (ref 150–400)
RBC: 4.17 MIL/uL — ABNORMAL LOW (ref 4.22–5.81)
RDW: 13.9 % (ref 11.5–15.5)
WBC: 10.1 10*3/uL (ref 4.0–10.5)
nRBC: 0 % (ref 0.0–0.2)

## 2020-08-06 LAB — HEPARIN LEVEL (UNFRACTIONATED): Heparin Unfractionated: 0.45 IU/mL (ref 0.30–0.70)

## 2020-08-06 NOTE — Progress Notes (Signed)
Patient denies recurrent chest pain. He is on a Heparin drip. He states he is kind of lonely and hopes family visits. Patient on OR schedule for CABG on Tuesday with Dr. Kipp Brood.

## 2020-08-06 NOTE — Progress Notes (Signed)
ANTICOAGULATION CONSULT NOTE - Follow Up Consult  Pharmacy Consult for IV Heparin Indication: chest pain/ACS  Allergies  Allergen Reactions  . Penicillins     Unknown reaction    Patient Measurements: Height: 5\' 8"  (172.7 cm) Weight: 86.2 kg (190 lb 1.6 oz) IBW/kg (Calculated) : 68.4 Heparin Dosing Weight:  86.4 kg  Vital Signs: Temp: 97.8 F (36.6 C) (03/19 0324) Temp Source: Oral (03/19 0324) BP: 127/97 (03/19 0324) Pulse Rate: 83 (03/19 0324)  Labs: Recent Labs    08/04/20 0207 08/05/20 0313 08/06/20 0202  HGB 13.4 13.6 13.4  HCT 37.7* 38.3* 37.6*  PLT 203 226 213  HEPARINUNFRC 0.48 0.49 0.45  CREATININE  --  1.21  --     Estimated Creatinine Clearance: 69.3 mL/min (by C-G formula based on SCr of 1.21 mg/dL).   Medical History: Past Medical History:  Diagnosis Date  . CAD (coronary artery disease)    a. 05/2013: NSTEMI s/p DES to mRCA (culprit vessel), residual moderate LAD disease (the patient fails medical therapy and this was found to be significant, this would be amenable to PCI).  . CHF (congestive heart failure) ([place])   . CKD (chronic kidney disease), stage II   . DVT (deep venous thrombosis) ([place]) 1981  . Hypercholesteremia   . Hyperglycemia   . Hypertension     Assessment: 60 yr old man with hx of CAD/NSTEMI, S/P DES in 7915; chronic diastolic HF; HTN; HLD; CKD, now with chest pressure associated with SOB when walking his dog. Pt is S/P cardiac cath, which showed 3-vessel obstructive CAD, including critical 95% proximal LAD. TCTS has been consulted for CABG evaluation. Pharmacy was consulted to dose IV heparin. Plans for CABG next week. -Heparin level at goal, CBC stable, no bleeding noted.   Goal of Therapy:  Heparin level 0.3-0.7 units/ml Monitor platelets by anticoagulation protocol: Yes   Plan:  Continue heparin infusion at 1200 units/hr Monitor daily heparin level, CBC  Rebbeca Paul, PharmD PGY1 Pharmacy Resident 08/06/2020 7:21  AM  Please check [email or web address] for unit-specific pharmacy phone numbers.

## 2020-08-06 NOTE — Progress Notes (Signed)
Progress Note  Patient Name: Lonnie Skinner Date of Encounter: 08/06/2020  Primary Cardiologist: Ena Dawley, MD   Subjective   He has had no recurrent chest pain. Plan for CABG on Tuesday.   Inpatient Medications    Scheduled Meds: . aspirin EC  81 mg Oral Daily  . atorvastatin  80 mg Oral QPM  . carvedilol  12.5 mg Oral BID WC  . furosemide  40 mg Oral Daily  . isosorbide mononitrate  30 mg Oral Daily  . sodium chloride flush  3 mL Intravenous Q12H  . sodium chloride flush  3 mL Intravenous Q12H   Continuous Infusions: . sodium chloride    . heparin 1,200 Units/hr (08/06/20 0650)   PRN Meds: sodium chloride, acetaminophen, ALPRAZolam, nitroGLYCERIN, ondansetron (ZOFRAN) IV, sodium chloride flush   Vital Signs    Vitals:   08/05/20 1314 08/05/20 2051 08/06/20 0254 08/06/20 0324  BP: 124/85 115/77  (!) 127/97  Pulse: 80 74  83  Resp:  15  18  Temp: 97.8 F (36.6 C) 97.8 F (36.6 C)  97.8 F (36.6 C)  TempSrc: Oral Oral  Oral  SpO2: 94% 95%  98%  Weight:   86.2 kg   Height:        Intake/Output Summary (Last 24 hours) at 08/06/2020 1026 Last data filed at 08/06/2020 0729 Gross per 24 hour  Intake 296.69 ml  Output 1675 ml  Net -1378.31 ml   Filed Weights   08/04/20 0526 08/05/20 0350 08/06/20 0254  Weight: 87.5 kg 86 kg 86.2 kg    Physical Exam   General: Well developed, well nourished, NAD Neck: Negative for carotid bruits. No JVD Lungs:Clear to ausculation bilaterally. No wheezes, rales, or rhonchi. Breathing is unlabored. Cardiovascular: RRR with S1 S2. No murmurs Abdomen: Soft, non-tender, non-distended. No obvious abdominal masses. Extremities: No edema. Radial pulses 2+ bilaterally Neuro: Alert and oriented. No focal deficits. No facial asymmetry. MAE spontaneously. Psych: Responds to questions appropriately with normal affect.    Labs    Chemistry Recent Labs  Lab 08/03/20 0207 08/05/20 0313  NA 136 136  K 3.5 3.4*  CL 105  101  CO2 22 28  GLUCOSE 131* 89  BUN 20 27*  CREATININE 1.20 1.21  CALCIUM 8.3* 9.3  GFRNONAA >60 >60  ANIONGAP 9 7     Hematology Recent Labs  Lab 08/04/20 0207 08/05/20 0313 08/06/20 0202  WBC 7.4 9.0 10.1  RBC 4.15* 4.28 4.17*  HGB 13.4 13.6 13.4  HCT 37.7* 38.3* 37.6*  MCV 90.8 89.5 90.2  MCH 32.3 31.8 32.1  MCHC 35.5 35.5 35.6  RDW 14.0 13.7 13.9  PLT 203 226 213    Cardiac EnzymesNo results for input(s): TROPONINI in the last 168 hours. No results for input(s): TROPIPOC in the last 168 hours.   BNPNo results for input(s): BNP, PROBNP in the last 168 hours.   DDimer No results for input(s): DDIMER in the last 168 hours.   Radiology    No results found.  Telemetry     08/05/20 NSR with rates in the 60-70's- Personally Reviewed  ECG    No new tracing as of 08/05/20- Personally Reviewed  Cardiac Studies    Cardiac cath 08/02/20   Prox LAD lesion is 95% stenosed.  Prox LAD to Mid LAD lesion is 80% stenosed.  1st Mrg lesion is 75% stenosed.  2nd Mrg lesion is 60% stenosed.  Prox RCA to Mid RCA lesion is 70% stenosed.  Previously placed Mid RCA-1 stent (unknown type) is widely patent.  Mid RCA-2 lesion is 50% stenosed.  The left ventricular systolic function is normal.  LV end diastolic pressure is mildly elevated.  The left ventricular ejection fraction is 55-65% by visual estimate.  1. 3 vessel obstructive CAD - critical 95% proximal LAD - 70% OM1 - 60% OM2 - 70% mid RCA proximal to prior stent with abnormal RFR 2. Normal LV function 3. Mildly elevated LVEDP  Plan: recommend CABG. Will hold Plavix. Admit to telemetry and treat with IV heparin. Check Echo.   Echo 08/02/20  1. Left ventricular ejection fraction, by estimation, is 60 to 65%. The  left ventricle has normal function. The left ventricle has no regional  wall motion abnormalities. Left ventricular diastolic parameters were  normal.  2. Right ventricular  systolic function is normal. The right ventricular  size is normal. Tricuspid regurgitation signal is inadequate for assessing  PA pressure.  3. The mitral valve is normal in structure. No evidence of mitral valve  regurgitation. No evidence of mitral stenosis.  4. The aortic valve is normal in structure. Aortic valve regurgitation is  not visualized. No aortic stenosis is present.  5. The inferior vena cava is normal in size with greater than 50%  respiratory variability, suggesting right atrial pressure of 3 mmHg.   Patient Profile     60 y.o. male with hx of CAD/NSTEMI s/p DES to [facility] with residual moderate LAD disease in 9357, chronic diastolic HF, HTN, HLD, CKD now with with cath for angina, associated SOB, Cath with significant CAD and is being evaluated for CABG  Assessment & Plan    1. CAD with NSTEMI: 2. HTN: 3. Chronic diastolic CHF: 4. HLD: 5. CKD stage II: 6. Hypokalemia:  The patient is awaiting bypass surgery next week, he has no recurrent chest pain, he appears euvolemic, his LVEF is 60 to 65% on echocardiogram with no regional wall motion abnormalities, he is on Plavix washout, repeat lab is unchanged at 179, will repeat again on Monday, he is scheduled for surgery on Tuesday.  Blood pressure is now at goal.  We will continue aspirin and atorvastatin 80 mg daily, continue heparin drip.  He has no signs of fluid overload on physical exam.  Ena Dawley, MD 08/06/2020

## 2020-08-06 NOTE — Plan of Care (Signed)

## 2020-08-06 NOTE — Progress Notes (Signed)
CARDIAC REHAB PHASE I   PRE:  Rate/Rhythm: 76 SR  BP:  Supine:   Sitting: 102/80     SaO2: 98% RA  MODE:  Ambulation: 1410 ft   POST:  Rate/Rhythm: 93SR  BP:  Supine:   Sitting: 101/75    SaO2: 985 RA  Pt was seated on EOB. Pt needed assistance with taking socks off and putting shoes on. Put the gait belt around him for precaution but he was able to stand up independently. Once standing he did have to wait for a minute due to stiffness in his back. Pt stated he wanted to walk and the stiffness would ease with movement. Pt was mostly independent with ambulation, he was pushing IV pole and I barely had to hold onto gait belt. He had slow but steady gait. He did have to stop a couple of times due to SOB. He was able to ambulate 1410 ft with no concerns. Returned pt to EOB with call bell in reach. Encouraged continued IS use and continued ambulation tomorrow as long as someone was with him (NT, or RN). Pt verbalized understanding.  [id number]  Rick Duff, MS, ACSM-CEP

## 2020-08-07 DIAGNOSIS — R079 Chest pain, unspecified: Secondary | ICD-10-CM | POA: Diagnosis not present

## 2020-08-07 DIAGNOSIS — E782 Mixed hyperlipidemia: Secondary | ICD-10-CM | POA: Diagnosis not present

## 2020-08-07 DIAGNOSIS — I10 Essential (primary) hypertension: Secondary | ICD-10-CM | POA: Diagnosis not present

## 2020-08-07 DIAGNOSIS — Z87891 Personal history of nicotine dependence: Secondary | ICD-10-CM | POA: Diagnosis not present

## 2020-08-07 LAB — CBC
HCT: 38.7 % — ABNORMAL LOW (ref 39.0–52.0)
Hemoglobin: 13.9 g/dL (ref 13.0–17.0)
MCH: 32.2 pg (ref 26.0–34.0)
MCHC: 35.9 g/dL (ref 30.0–36.0)
MCV: 89.6 fL (ref 80.0–100.0)
Platelets: 185 10*3/uL (ref 150–400)
RBC: 4.32 MIL/uL (ref 4.22–5.81)
RDW: 13.6 % (ref 11.5–15.5)
WBC: 9.1 10*3/uL (ref 4.0–10.5)
nRBC: 0 % (ref 0.0–0.2)

## 2020-08-07 LAB — HEPARIN LEVEL (UNFRACTIONATED): Heparin Unfractionated: 0.43 IU/mL (ref 0.30–0.70)

## 2020-08-07 LAB — PLATELET INHIBITION P2Y12: Platelet Function  P2Y12: 166 [PRU] — ABNORMAL LOW (ref 182–335)

## 2020-08-07 NOTE — Progress Notes (Signed)
ANTICOAGULATION CONSULT NOTE - Follow Up Consult  Pharmacy Consult for IV Heparin Indication: chest pain/ACS  Allergies  Allergen Reactions  . Penicillins     Unknown reaction    Patient Measurements: Height: 5\' 8"  (172.7 cm) Weight: 86.2 kg (190 lb 1.6 oz) IBW/kg (Calculated) : 68.4 Heparin Dosing Weight:  86.4 kg  Vital Signs: Temp: 97.9 F (36.6 C) (03/19 2009) Temp Source: Oral (03/19 2009) BP: 118/86 (03/19 2009) Pulse Rate: 76 (03/19 2009)  Labs: Recent Labs    08/05/20 0313 08/06/20 0202 08/07/20 0212  HGB 13.6 13.4 13.9  HCT 38.3* 37.6* 38.7*  PLT 226 213 185  HEPARINUNFRC 0.49 0.45 0.43  CREATININE 1.21  --   --     Estimated Creatinine Clearance: 69.3 mL/min (by C-G formula based on SCr of 1.21 mg/dL).   Medical History: Past Medical History:  Diagnosis Date  . CAD (coronary artery disease)    a. 05/2013: NSTEMI s/p DES to mRCA (culprit vessel), residual moderate LAD disease (the patient fails medical therapy and this was found to be significant, this would be amenable to PCI).  . CHF (congestive heart failure) ([place])   . CKD (chronic kidney disease), stage II   . DVT (deep venous thrombosis) ([place]) 1981  . Hypercholesteremia   . Hyperglycemia   . Hypertension     Assessment: 60 yr old man with hx of CAD/NSTEMI, S/P DES in 8119; chronic diastolic HF; HTN; HLD; CKD, now with chest pressure associated with SOB when walking his dog. Pt is S/P cardiac cath, which showed 3-vessel obstructive CAD, including critical 95% proximal LAD. TCTS has been consulted for CABG evaluation. Pharmacy was consulted to dose IV heparin. Plans for CABG next week.  Heparin level remains therapeutic at 0.43, CBC stable, no bleeding noted.   Goal of Therapy:  Heparin level 0.3-0.7 units/ml Monitor platelets by anticoagulation protocol: Yes   Plan:  Continue heparin infusion at 1200 units/hr Monitor daily heparin level, CBC Plan for CABG Tuesday  Rebbeca Paul,  PharmD PGY1 Pharmacy Resident 08/07/2020 7:12 AM  Please check [email or web address] for unit-specific pharmacy phone numbers.

## 2020-08-07 NOTE — Plan of Care (Signed)
Pt vss. No acute distress noted. No complaints of cp or soa.    Problem: Education: Goal: Knowledge of General Education information will improve Description: Including pain rating scale, medication(s)/side effects and non-pharmacologic comfort measures Outcome: Progressing   Problem: Health Behavior/Discharge Planning: Goal: Ability to manage health-related needs will improve Outcome: Progressing   Problem: Clinical Measurements: Goal: Ability to maintain clinical measurements within normal limits will improve Outcome: Progressing Goal: Will remain free from infection Outcome: Progressing Goal: Diagnostic test results will improve Outcome: Progressing Goal: Respiratory complications will improve Outcome: Progressing Goal: Cardiovascular complication will be avoided Outcome: Progressing   Problem: Activity: Goal: Risk for activity intolerance will decrease Outcome: Progressing   Problem: Nutrition: Goal: Adequate nutrition will be maintained Outcome: Progressing   Problem: Coping: Goal: Level of anxiety will decrease Outcome: Progressing   Problem: Elimination: Goal: Will not experience complications related to bowel motility Outcome: Progressing Goal: Will not experience complications related to urinary retention Outcome: Progressing   Problem: Pain Managment: Goal: General experience of comfort will improve Outcome: Progressing   Problem: Safety: Goal: Ability to remain free from injury will improve Outcome: Progressing   Problem: Skin Integrity: Goal: Risk for impaired skin integrity will decrease Outcome: Progressing   Problem: Education: Goal: Understanding of CV disease, CV risk reduction, and recovery process will improve Outcome: Progressing Goal: Individualized Educational Video(s) Outcome: Progressing   Problem: Activity: Goal: Ability to return to baseline activity level will improve Outcome: Progressing   Problem: Cardiovascular: Goal:  Ability to achieve and maintain adequate cardiovascular perfusion will improve Outcome: Progressing Goal: Vascular access site(s) Level 0-1 will be maintained Outcome: Progressing   Problem: Health Behavior/Discharge Planning: Goal: Ability to safely manage health-related needs after discharge will improve Outcome: Progressing

## 2020-08-07 NOTE — Progress Notes (Signed)
Progress Note  Patient Name: Lonnie Skinner Date of Encounter: 08/07/2020  Primary Cardiologist: Ena Dawley, MD   Subjective   Asymptomatic while walking in the hallways. Plan for CABG on Tuesday.   Inpatient Medications    Scheduled Meds: . aspirin EC  81 mg Oral Daily  . atorvastatin  80 mg Oral QPM  . carvedilol  12.5 mg Oral BID WC  . furosemide  40 mg Oral Daily  . isosorbide mononitrate  30 mg Oral Daily  . sodium chloride flush  3 mL Intravenous Q12H  . sodium chloride flush  3 mL Intravenous Q12H   Continuous Infusions: . sodium chloride    . heparin 1,200 Units/hr (08/07/20 0400)   PRN Meds: sodium chloride, acetaminophen, ALPRAZolam, nitroGLYCERIN, ondansetron (ZOFRAN) IV, sodium chloride flush   Vital Signs    Vitals:   08/06/20 0254 08/06/20 0324 08/06/20 1612 08/06/20 2009  BP:  (!) 127/97 110/84 118/86  Pulse:  83 80 76  Resp:  18 18 19   Temp:  97.8 F (36.6 C) 98.6 F (37 C) 97.9 F (36.6 C)  TempSrc:  Oral Oral Oral  SpO2:  98% 98% 98%  Weight: 86.2 kg     Height:        Intake/Output Summary (Last 24 hours) at 08/07/2020 0858 Last data filed at 08/07/2020 0400 Gross per 24 hour  Intake 453.9 ml  Output --  Net 453.9 ml   Filed Weights   08/04/20 0526 08/05/20 0350 08/06/20 0254  Weight: 87.5 kg 86 kg 86.2 kg    Physical Exam   General: Well developed, well nourished, NAD Neck: Negative for carotid bruits. No JVD Lungs:Clear to ausculation bilaterally. No wheezes, rales, or rhonchi. Breathing is unlabored. Cardiovascular: RRR with S1 S2. No murmurs Abdomen: Soft, non-tender, non-distended. No obvious abdominal masses. Extremities: No edema. Radial pulses 2+ bilaterally Neuro: Alert and oriented. No focal deficits. No facial asymmetry. MAE spontaneously. Psych: Responds to questions appropriately with normal affect.    Labs    Chemistry Recent Labs  Lab 08/03/20 0207 08/05/20 0313  NA 136 136  K 3.5 3.4*  CL 105 101   CO2 22 28  GLUCOSE 131* 89  BUN 20 27*  CREATININE 1.20 1.21  CALCIUM 8.3* 9.3  GFRNONAA >60 >60  ANIONGAP 9 7     Hematology Recent Labs  Lab 08/05/20 0313 08/06/20 0202 08/07/20 0212  WBC 9.0 10.1 9.1  RBC 4.28 4.17* 4.32  HGB 13.6 13.4 13.9  HCT 38.3* 37.6* 38.7*  MCV 89.5 90.2 89.6  MCH 31.8 32.1 32.2  MCHC 35.5 35.6 35.9  RDW 13.7 13.9 13.6  PLT 226 213 185    Cardiac EnzymesNo results for input(s): TROPONINI in the last 168 hours. No results for input(s): TROPIPOC in the last 168 hours.   BNPNo results for input(s): BNP, PROBNP in the last 168 hours.   DDimer No results for input(s): DDIMER in the last 168 hours.   Radiology    No results found.  Telemetry     08/05/20 NSR with rates in the 60-70's- Personally Reviewed  ECG    No new tracing as of 08/05/20- Personally Reviewed  Cardiac Studies    Cardiac cath 08/02/20   Prox LAD lesion is 95% stenosed.  Prox LAD to Mid LAD lesion is 80% stenosed.  1st Mrg lesion is 75% stenosed.  2nd Mrg lesion is 60% stenosed.  Prox RCA to Mid RCA lesion is 70% stenosed.  Previously placed  Mid RCA-1 stent (unknown type) is widely patent.  Mid RCA-2 lesion is 50% stenosed.  The left ventricular systolic function is normal.  LV end diastolic pressure is mildly elevated.  The left ventricular ejection fraction is 55-65% by visual estimate.  1. 3 vessel obstructive CAD - critical 95% proximal LAD - 70% OM1 - 60% OM2 - 70% mid RCA proximal to prior stent with abnormal RFR 2. Normal LV function 3. Mildly elevated LVEDP  Plan: recommend CABG. Will hold Plavix. Admit to telemetry and treat with IV heparin. Check Echo.   Echo 08/02/20  1. Left ventricular ejection fraction, by estimation, is 60 to 65%. The  left ventricle has normal function. The left ventricle has no regional  wall motion abnormalities. Left ventricular diastolic parameters were  normal.  2. Right ventricular systolic  function is normal. The right ventricular  size is normal. Tricuspid regurgitation signal is inadequate for assessing  PA pressure.  3. The mitral valve is normal in structure. No evidence of mitral valve  regurgitation. No evidence of mitral stenosis.  4. The aortic valve is normal in structure. Aortic valve regurgitation is  not visualized. No aortic stenosis is present.  5. The inferior vena cava is normal in size with greater than 50%  respiratory variability, suggesting right atrial pressure of 3 mmHg.   Patient Profile     60 y.o. male with hx of CAD/NSTEMI s/p DES to [facility] with residual moderate LAD disease in 3762, chronic diastolic HF, HTN, HLD, CKD now with with cath for angina, associated SOB, Cath with significant CAD and is being evaluated for CABG  Assessment & Plan    1. CAD with NSTEMI: 2. HTN: 3. Chronic diastolic CHF: 4. HLD: 5. CKD stage II: 6. Hypokalemia:  The patient is awaiting bypass surgery next week, he has no recurrent chest pain, he appears euvolemic, his LVEF is 60 to 65% on echocardiogram with no regional wall motion abnormalities, he is on Plavix washout, repeat lab is unchanged at 179, will repeat again on Monday, he is scheduled for surgery on Tuesday.  Blood pressure is now at goal.  We will continue aspirin and atorvastatin 80 mg daily, continue heparin drip.  He has no signs of fluid overload on physical exam.  Ena Dawley, MD 08/07/2020

## 2020-08-07 NOTE — Plan of Care (Signed)

## 2020-08-08 ENCOUNTER — Inpatient Hospital Stay (HOSPITAL_COMMUNITY): Payer: Medicare Other

## 2020-08-08 DIAGNOSIS — E782 Mixed hyperlipidemia: Secondary | ICD-10-CM | POA: Diagnosis not present

## 2020-08-08 DIAGNOSIS — N182 Chronic kidney disease, stage 2 (mild): Secondary | ICD-10-CM | POA: Diagnosis not present

## 2020-08-08 DIAGNOSIS — I25110 Atherosclerotic heart disease of native coronary artery with unstable angina pectoris: Secondary | ICD-10-CM | POA: Diagnosis not present

## 2020-08-08 DIAGNOSIS — Z01810 Encounter for preprocedural cardiovascular examination: Secondary | ICD-10-CM

## 2020-08-08 DIAGNOSIS — I10 Essential (primary) hypertension: Secondary | ICD-10-CM | POA: Diagnosis not present

## 2020-08-08 LAB — BASIC METABOLIC PANEL
Anion gap: 6 (ref 5–15)
BUN: 24 mg/dL — ABNORMAL HIGH (ref 6–20)
CO2: 30 mmol/L (ref 22–32)
Calcium: 9.3 mg/dL (ref 8.9–10.3)
Chloride: 101 mmol/L (ref 98–111)
Creatinine, Ser: 1.27 mg/dL — ABNORMAL HIGH (ref 0.61–1.24)
GFR, Estimated: >60 mL/min (ref >60)
Glucose, Bld: 97 mg/dL (ref 70–99)
Potassium: 4 mmol/L (ref 3.5–5.1)
Sodium: 137 mmol/L (ref 135–145)

## 2020-08-08 LAB — CBC
HCT: 39.2 % (ref 39.0–52.0)
Hemoglobin: 13.8 g/dL (ref 13.0–17.0)
MCH: 31.9 pg (ref 26.0–34.0)
MCHC: 35.2 g/dL (ref 30.0–36.0)
MCV: 90.7 fL (ref 80.0–100.0)
Platelets: 203 10*3/uL (ref 150–400)
RBC: 4.32 MIL/uL (ref 4.22–5.81)
RDW: 13.6 % (ref 11.5–15.5)
WBC: 7.6 10*3/uL (ref 4.0–10.5)
nRBC: 0 % (ref 0.0–0.2)

## 2020-08-08 LAB — TYPE AND SCREEN
ABO/RH(D): AB POS
Antibody Screen: NEGATIVE

## 2020-08-08 LAB — HEPARIN LEVEL (UNFRACTIONATED): Heparin Unfractionated: 0.44 IU/mL (ref 0.30–0.70)

## 2020-08-08 LAB — ABO/RH: ABO/RH(D): AB POS

## 2020-08-08 MED ORDER — CHLORHEXIDINE GLUCONATE CLOTH 2 % EX PADS
6.0000 | MEDICATED_PAD | Freq: Once | CUTANEOUS | Status: AC
  Administered 2020-08-09: 6 via TOPICAL

## 2020-08-08 MED ORDER — DEXMEDETOMIDINE HCL IN NACL 400 MCG/100ML IV SOLN
0.1000 ug/kg/h | INTRAVENOUS | Status: AC
  Administered 2020-08-09: .7 ug/kg/h via INTRAVENOUS
  Filled 2020-08-08: qty 100

## 2020-08-08 MED ORDER — MILRINONE LACTATE IN DEXTROSE 20-5 MG/100ML-% IV SOLN
0.3000 ug/kg/min | INTRAVENOUS | Status: DC
  Filled 2020-08-08: qty 100

## 2020-08-08 MED ORDER — TRANEXAMIC ACID (OHS) PUMP PRIME SOLUTION
2.0000 mg/kg | INTRAVENOUS | Status: DC
  Filled 2020-08-08: qty 1.72

## 2020-08-08 MED ORDER — SODIUM CHLORIDE 0.9 % IV SOLN
INTRAVENOUS | Status: DC
  Filled 2020-08-08: qty 30

## 2020-08-08 MED ORDER — LEVOFLOXACIN IN D5W 500 MG/100ML IV SOLN
500.0000 mg | INTRAVENOUS | Status: AC
  Administered 2020-08-09: 500 mg via INTRAVENOUS
  Filled 2020-08-08: qty 100

## 2020-08-08 MED ORDER — METOPROLOL TARTRATE 12.5 MG HALF TABLET
12.5000 mg | ORAL_TABLET | Freq: Once | ORAL | Status: AC
  Administered 2020-08-09: 12.5 mg via ORAL
  Filled 2020-08-08: qty 1

## 2020-08-08 MED ORDER — PHENYLEPHRINE HCL-NACL 20-0.9 MG/250ML-% IV SOLN
30.0000 ug/min | INTRAVENOUS | Status: AC
  Administered 2020-08-09: 25 ug/min via INTRAVENOUS
  Filled 2020-08-08: qty 250

## 2020-08-08 MED ORDER — NOREPINEPHRINE 4 MG/250ML-% IV SOLN
0.0000 ug/min | INTRAVENOUS | Status: DC
  Filled 2020-08-08: qty 250

## 2020-08-08 MED ORDER — TRANEXAMIC ACID (OHS) BOLUS VIA INFUSION
15.0000 mg/kg | INTRAVENOUS | Status: AC
  Administered 2020-08-09: 1290 mg via INTRAVENOUS
  Filled 2020-08-08: qty 1290

## 2020-08-08 MED ORDER — POTASSIUM CHLORIDE 2 MEQ/ML IV SOLN
80.0000 meq | INTRAVENOUS | Status: DC
  Filled 2020-08-08: qty 40

## 2020-08-08 MED ORDER — BISACODYL 5 MG PO TBEC
5.0000 mg | DELAYED_RELEASE_TABLET | Freq: Once | ORAL | Status: AC
  Administered 2020-08-08: 5 mg via ORAL
  Filled 2020-08-08: qty 1

## 2020-08-08 MED ORDER — NITROGLYCERIN IN D5W 200-5 MCG/ML-% IV SOLN
2.0000 ug/min | INTRAVENOUS | Status: DC
  Filled 2020-08-08: qty 250

## 2020-08-08 MED ORDER — MANNITOL 20 % IV SOLN
Freq: Once | INTRAVENOUS | Status: DC
  Filled 2020-08-08: qty 13

## 2020-08-08 MED ORDER — EPINEPHRINE HCL 5 MG/250ML IV SOLN IN NS
0.0000 ug/min | INTRAVENOUS | Status: DC
  Filled 2020-08-08: qty 250

## 2020-08-08 MED ORDER — INSULIN REGULAR(HUMAN) IN NACL 100-0.9 UT/100ML-% IV SOLN
INTRAVENOUS | Status: AC
  Administered 2020-08-09: .9 [IU]/h via INTRAVENOUS
  Filled 2020-08-08: qty 100

## 2020-08-08 MED ORDER — PLASMA-LYTE 148 IV SOLN
INTRAVENOUS | Status: DC
  Filled 2020-08-08: qty 2.5

## 2020-08-08 MED ORDER — CHLORHEXIDINE GLUCONATE CLOTH 2 % EX PADS
6.0000 | MEDICATED_PAD | Freq: Once | CUTANEOUS | Status: AC
  Administered 2020-08-08: 6 via TOPICAL

## 2020-08-08 MED ORDER — TRANEXAMIC ACID 1000 MG/10ML IV SOLN
1.5000 mg/kg/h | INTRAVENOUS | Status: AC
  Administered 2020-08-09: 1.5 mg/kg/h via INTRAVENOUS
  Filled 2020-08-08: qty 25

## 2020-08-08 MED ORDER — VANCOMYCIN HCL 1500 MG/300ML IV SOLN
1500.0000 mg | INTRAVENOUS | Status: AC
  Administered 2020-08-09: 1500 mg via INTRAVENOUS
  Filled 2020-08-08: qty 300

## 2020-08-08 MED ORDER — TEMAZEPAM 15 MG PO CAPS
15.0000 mg | ORAL_CAPSULE | Freq: Once | ORAL | Status: DC | PRN
  Administered 2020-08-08: 15 mg via ORAL
  Filled 2020-08-08: qty 1

## 2020-08-08 MED ORDER — CHLORHEXIDINE GLUCONATE 0.12 % MT SOLN
15.0000 mL | Freq: Once | OROMUCOSAL | Status: AC
  Administered 2020-08-09: 15 mL via OROMUCOSAL
  Filled 2020-08-08: qty 15

## 2020-08-08 NOTE — Progress Notes (Signed)
CARDIAC REHAB PHASE I   PRE:  Rate/Rhythm: 78 SR    BP: sitting     SaO2:   MODE:  Ambulation: 1540 ft   POST:  Rate/Rhythm: 89 SR    BP: sitting 115/71     SaO2:   Pt wanted to "finish his walk" since he had to go to vascular earlier. Tolerated well, no c/o. Does rest 1-2 times for SOB that he sts is due to mask. Reviewed sternal precautions, mobility, IS, d/c planning. Pt still sts his children cannot help post op. He is anxious. [place], ACSM 08/08/2020 12:44 PM

## 2020-08-08 NOTE — Progress Notes (Addendum)
      [address]       [place] [id number]             [phone]      6 Days Post-Op Procedure(s) (LRB): LEFT HEART CATH AND CORONARY ANGIOGRAPHY (N/A) INTRAVASCULAR PRESSURE WIRE/FFR STUDY (N/A) Subjective:  no SOB/Chest pain  Objective: Vital signs in last 24 hours: Temp:  [97.9 F (36.6 C)] 97.9 F (36.6 C) (03/21 0654) Pulse Rate:  [68-69] 69 (03/21 0654) Cardiac Rhythm: Normal sinus rhythm (03/20 2000) Resp:  [16-18] 18 (03/21 0654) BP: (117-118)/(70-79) 117/79 (03/21 0654) SpO2:  [98 %] 98 % (03/21 0654) Weight:  [86 kg] 86 kg (03/21 0654)  Hemodynamic parameters for last 24 hours:    Intake/Output from previous day: 03/20 0701 - 03/21 0700 In: 826.7 [P.O.:587; I.V.:239.7] Out: -  Intake/Output this shift: No intake/output data recorded.  General appearance: alert, cooperative and no distress Heart: regular rate and rhythm Lungs: clear to auscultation bilaterally  Lab Results: Recent Labs    08/07/20 0212 08/08/20 0434  WBC 9.1 7.6  HGB 13.9 13.8  HCT 38.7* 39.2  PLT 185 203   BMET:  Recent Labs    08/08/20 0434  NA 137  K 4.0  CL 101  CO2 30  GLUCOSE 97  BUN 24*  CREATININE 1.27*  CALCIUM 9.3    PT/INR: No results for input(s): LABPROT, INR in the last 72 hours. ABG    Component Value Date/Time   TCO2 30 06/16/2020 1604   CBG (last 3)  No results for input(s): GLUCAP in the last 72 hours.  Meds Scheduled Meds: . aspirin EC  81 mg Oral Daily  . atorvastatin  80 mg Oral QPM  . carvedilol  12.5 mg Oral BID WC  . furosemide  40 mg Oral Daily  . isosorbide mononitrate  30 mg Oral Daily  . sodium chloride flush  3 mL Intravenous Q12H  . sodium chloride flush  3 mL Intravenous Q12H   Continuous Infusions: . sodium chloride    . heparin 1,200 Units/hr (08/08/20 0000)   PRN Meds:.sodium chloride, acetaminophen, ALPRAZolam, nitroGLYCERIN, ondansetron (ZOFRAN) IV, sodium chloride flush  Xrays No results  found.  Assessment/Plan: S/P Procedure(s) (LRB): LEFT HEART CATH AND CORONARY ANGIOGRAPHY (N/A) INTRAVASCULAR PRESSURE WIRE/FFR STUDY (N/A)  1 clinically stable with plans for OR 3/22    LOS: 6 days    John Giovanni PA-C Pager [phone] 08/08/2020   Agree with above. OR for CABG tomorrow.  Demetra Moya Bary Leriche

## 2020-08-08 NOTE — Plan of Care (Signed)
No acute events. Plan for cabg on Tuesday. No complaints of CP or soa.    Problem: Education: Goal: Knowledge of General Education information will improve Description: Including pain rating scale, medication(s)/side effects and non-pharmacologic comfort measures Outcome: Not Progressing   Problem: Health Behavior/Discharge Planning: Goal: Ability to manage health-related needs will improve Outcome: Not Progressing   Problem: Clinical Measurements: Goal: Ability to maintain clinical measurements within normal limits will improve Outcome: Not Progressing Goal: Will remain free from infection Outcome: Not Progressing Goal: Diagnostic test results will improve Outcome: Not Progressing Goal: Respiratory complications will improve Outcome: Not Progressing Goal: Cardiovascular complication will be avoided Outcome: Not Progressing   Problem: Activity: Goal: Risk for activity intolerance will decrease Outcome: Not Progressing   Problem: Nutrition: Goal: Adequate nutrition will be maintained Outcome: Not Progressing   Problem: Coping: Goal: Level of anxiety will decrease Outcome: Not Progressing   Problem: Elimination: Goal: Will not experience complications related to bowel motility Outcome: Not Progressing Goal: Will not experience complications related to urinary retention Outcome: Not Progressing   Problem: Pain Managment: Goal: General experience of comfort will improve Outcome: Not Progressing   Problem: Safety: Goal: Ability to remain free from injury will improve Outcome: Not Progressing   Problem: Skin Integrity: Goal: Risk for impaired skin integrity will decrease Outcome: Not Progressing   Problem: Education: Goal: Understanding of CV disease, CV risk reduction, and recovery process will improve Outcome: Not Progressing Goal: Individualized Educational Video(s) Outcome: Not Progressing   Problem: Activity: Goal: Ability to return to baseline activity  level will improve Outcome: Not Progressing   Problem: Cardiovascular: Goal: Ability to achieve and maintain adequate cardiovascular perfusion will improve Outcome: Not Progressing Goal: Vascular access site(s) Level 0-1 will be maintained Outcome: Not Progressing   Problem: Health Behavior/Discharge Planning: Goal: Ability to safely manage health-related needs after discharge will improve Outcome: Not Progressing

## 2020-08-08 NOTE — Anesthesia Preprocedure Evaluation (Addendum)
Anesthesia Evaluation  Patient identified by MRN, date of birth, ID band Patient awake    Reviewed: Allergy & Precautions, H&P , NPO status , Patient's Chart, lab work & pertinent test results  Airway Mallampati: III  TM Distance: >3 FB Neck ROM: Full    Dental no notable dental hx. (+) Poor Dentition, Dental Advisory Given   Pulmonary neg pulmonary ROS, former smoker,    Pulmonary exam normal breath sounds clear to auscultation       Cardiovascular Exercise Tolerance: Good hypertension, Pt. on medications and Pt. on home beta blockers + angina + CAD, + Past MI, + Cardiac Stents and +CHF   Rhythm:Regular Rate:Normal     Neuro/Psych Anxiety negative neurological ROS     GI/Hepatic negative GI ROS, Neg liver ROS,   Endo/Other  negative endocrine ROS  Renal/GU Renal InsufficiencyRenal disease  negative genitourinary   Musculoskeletal   Abdominal   Peds  Hematology negative hematology ROS (+)   Anesthesia Other Findings   Reproductive/Obstetrics negative OB ROS                            Anesthesia Physical Anesthesia Plan  ASA: IV  Anesthesia Plan: General   Post-op Pain Management:    Induction: Intravenous  PONV Risk Score and Plan: 2 and Midazolam and Ondansetron  Airway Management Planned: Oral ETT  Additional Equipment: Arterial line, CVP, TEE and Ultrasound Guidance Line Placement  Intra-op Plan:   Post-operative Plan: Post-operative intubation/ventilation  Informed Consent: I have reviewed the patients History and Physical, chart, labs and discussed the procedure including the risks, benefits and alternatives for the proposed anesthesia with the patient or authorized representative who has indicated his/her understanding and acceptance.     Dental advisory given  Plan Discussed with: CRNA  Anesthesia Plan Comments:        Anesthesia Quick Evaluation

## 2020-08-08 NOTE — Progress Notes (Signed)
ANTICOAGULATION CONSULT NOTE - Follow Up Consult  Pharmacy Consult for IV Heparin Indication: chest pain/ACS  Allergies  Allergen Reactions  . Penicillins     Unknown reaction    Patient Measurements: Height: 5\' 8"  (172.7 cm) Weight: 86 kg (189 lb 8 oz) IBW/kg (Calculated) : 68.4 Heparin Dosing Weight:  86.4 kg  Vital Signs: Temp: 97.5 F (36.4 C) (03/21 0823) Temp Source: Oral (03/21 0823) BP: 139/75 (03/21 0823) Pulse Rate: 66 (03/21 0823)  Labs: Recent Labs    08/06/20 0202 08/07/20 0212 08/08/20 0434  HGB 13.4 13.9 13.8  HCT 37.6* 38.7* 39.2  PLT 213 185 203  HEPARINUNFRC 0.45 0.43 0.44  CREATININE  --   --  1.27*    Estimated Creatinine Clearance: 66 mL/min (A) (by C-G formula based on SCr of 1.27 mg/dL (H)).   Medical History: Past Medical History:  Diagnosis Date  . CAD (coronary artery disease)    a. 05/2013: NSTEMI s/p DES to mRCA (culprit vessel), residual moderate LAD disease (the patient fails medical therapy and this was found to be significant, this would be amenable to PCI).  . CHF (congestive heart failure) ([place])   . CKD (chronic kidney disease), stage II   . DVT (deep venous thrombosis) ([place]) 1981  . Hypercholesteremia   . Hyperglycemia   . Hypertension     Assessment: 60 yr old man with hx of CAD/NSTEMI, S/P DES in 2409; chronic diastolic HF; HTN; HLD; CKD, now with chest pressure associated with SOB when walking his dog. Pt is S/P cardiac cath, which showed 3-vessel obstructive CAD, including critical 95% proximal LAD. TCTS has been consulted for CABG evaluation. Pharmacy was consulted to dose IV heparin. Plans for CABG on 3/22  Heparin level remains therapeutic at 0.44, CBC stable.  Goal of Therapy:  Heparin level 0.3-0.7 units/ml Monitor platelets by anticoagulation protocol: Yes   Plan:  Continue heparin infusion at 1200 units/hr Monitor daily heparin level, CBC Plan for CABG   Hildred Laser, PharmD Clinical  Pharmacist **Pharmacist phone directory can now be found on [email or web address] ([email or web address]).  Listed under [place].

## 2020-08-08 NOTE — Progress Notes (Signed)
CARDIAC REHAB PHASE I   PRE:  Rate/Rhythm: 71 SR    BP: sitting 139/75    SaO2: 100 RA  MODE:  Ambulation: 950 ft   POST:  Rate/Rhythm: 89 SR    BP: sitting      SaO2:   Pt eager to walk. Able to don shoes and get out of bed independently. Limping due to right back pain and stiff ankle however steady pushing IV pole. Sts he slept wrong on his back. Will get hot pack for pt. Pt eager to take a long walk however transport came for vascular tests. Pt ask me to return later today, will f/u as time allows. [place], ACSM 08/08/2020 8:59 AM

## 2020-08-08 NOTE — Progress Notes (Signed)
Bedside report received. Team checks done. Pt vss. No acute distress noted. No family at bedside. Call bell with in reach. Bed alarm on. Will assume poc and cont to monitor pt per md orders. See charted full assessments.    

## 2020-08-08 NOTE — Progress Notes (Signed)
Progress Note  Patient Name: Lonnie Skinner Date of Encounter: 08/08/2020  Primary Cardiologist: Ena Dawley, MD   Subjective   Gets SOB w/ ambulation, no CP CABG scheduled for 7:15 tomorrow  Inpatient Medications    Scheduled Meds: . aspirin EC  81 mg Oral Daily  . atorvastatin  80 mg Oral QPM  . carvedilol  12.5 mg Oral BID WC  . furosemide  40 mg Oral Daily  . isosorbide mononitrate  30 mg Oral Daily  . sodium chloride flush  3 mL Intravenous Q12H  . sodium chloride flush  3 mL Intravenous Q12H   Continuous Infusions: . sodium chloride    . heparin 1,200 Units/hr (08/08/20 0000)   PRN Meds: sodium chloride, acetaminophen, ALPRAZolam, nitroGLYCERIN, ondansetron (ZOFRAN) IV, sodium chloride flush   Vital Signs    Vitals:   08/06/20 2009 08/07/20 1242 08/08/20 0654 08/08/20 0823  BP: 118/86 118/70 117/79 139/75  Pulse: 76 68 69 66  Resp: 19 16 18 16   Temp: 97.9 F (36.6 C) 97.9 F (36.6 C) 97.9 F (36.6 C) (!) 97.5 F (36.4 C)  TempSrc: Oral Oral Oral Oral  SpO2: 98%  98% 100%  Weight:   86 kg   Height:        Intake/Output Summary (Last 24 hours) at 08/08/2020 1039 Last data filed at 08/08/2020 0000 Gross per 24 hour  Intake 826.66 ml  Output --  Net 826.66 ml   Filed Weights   08/05/20 0350 08/06/20 0254 08/08/20 0654  Weight: 86 kg 86.2 kg 86 kg    Physical Exam   General: Well developed, well nourished, male in no acute distress Head: Eyes PERRLA, Head normocephalic and atraumatic Lungs: clear bilaterally to auscultation. Heart: HRRR S1 S2, without rub or gallop. No murmur. 4/4 extremity pulses are 2+ & equal. No JVD. Abdomen: Bowel sounds are present, abdomen soft and non-tender without masses or  hernias noted. Msk: Normal strength and tone for age. Extremities: No clubbing, cyanosis or edema.    Skin:  No rashes or lesions noted. Neuro: Alert and oriented X 3. Psych:  Good affect, responds appropriately  Labs     Chemistry Recent Labs  Lab 08/03/20 0207 08/05/20 0313 08/08/20 0434  NA 136 136 137  K 3.5 3.4* 4.0  CL 105 101 101  CO2 22 28 30   GLUCOSE 131* 89 97  BUN 20 27* 24*  CREATININE 1.20 1.21 1.27*  CALCIUM 8.3* 9.3 9.3  GFRNONAA >60 >60 >60  ANIONGAP 9 7 6      Hematology Recent Labs  Lab 08/06/20 0202 08/07/20 0212 08/08/20 0434  WBC 10.1 9.1 7.6  RBC 4.17* 4.32 4.32  HGB 13.4 13.9 13.8  HCT 37.6* 38.7* 39.2  MCV 90.2 89.6 90.7  MCH 32.1 32.2 31.9  MCHC 35.6 35.9 35.2  RDW 13.9 13.6 13.6  PLT 213 185 203    Cardiac Enzymes High Sensitivity Troponin:  No results for input(s): TROPONINIHS in the last 720 hours.      Lab Results  Component Value Date   CHOL 102 03/09/2020   HDL 39 (L) 03/09/2020   LDLCALC 45 03/09/2020   TRIG 90 03/09/2020   CHOLHDL 2.6 03/09/2020   Lab Results  Component Value Date   ALT 41 06/16/2020   AST 43 (H) 06/16/2020   ALKPHOS 68 06/16/2020   BILITOT 1.6 (H) 06/16/2020   Lab Results  Component Value Date   TSH 2.16 09/15/2014   Lab Results  Component  Value Date   HGBA1C 5.4 01/04/2017    BNPNo results for input(s): BNP, PROBNP in the last 168 hours.   Cardiology Studies    ECHO: 08/02/2020 1. Left ventricular ejection fraction, by estimation, is 60 to 65%. The  left ventricle has normal function. The left ventricle has no regional  wall motion abnormalities. Left ventricular diastolic parameters were  normal.  2. Right ventricular systolic function is normal. The right ventricular  size is normal. Tricuspid regurgitation signal is inadequate for assessing  PA pressure.  3. The mitral valve is normal in structure. No evidence of mitral valve  regurgitation. No evidence of mitral stenosis.  4. The aortic valve is normal in structure. Aortic valve regurgitation is  not visualized. No aortic stenosis is present.  5. The inferior vena cava is normal in size with greater than 50%  respiratory variability, suggesting  right atrial pressure of 3 mmHg.   CARDIAC CATH: 08/02/2020  Prox LAD lesion is 95% stenosed.  Prox LAD to Mid LAD lesion is 80% stenosed.  1st Mrg lesion is 75% stenosed.  2nd Mrg lesion is 60% stenosed.  Prox RCA to Mid RCA lesion is 70% stenosed.  Previously placed Mid RCA-1 stent (unknown type) is widely patent.  Mid RCA-2 lesion is 50% stenosed.  The left ventricular systolic function is normal.  LV end diastolic pressure is mildly elevated.  The left ventricular ejection fraction is 55-65% by visual estimate.   1. 3 vessel obstructive CAD    - critical 95% proximal LAD    - 70% OM1    - 60% OM2    - 70% mid RCA proximal to prior stent with abnormal RFR 2. Normal LV function 3. Mildly elevated LVEDP  Plan: recommend CABG. Will hold Plavix. Admit to telemetry and treat with IV heparin. Check Echo.  Diagnostic Dominance: Right      Radiology    VAS US DOPPLER PRE CABG  Result Date: 08/08/2020 PREOPERATIVE VASCULAR EVALUATION  Indications:      Pre-CABG. Risk Factors:     Hypertension, hyperlipidemia, past history of smoking, prior                   MI, coronary artery disease. Other Factors:    Stent RCA 2015. Comparison Study: No prior study Performing Technologist: Sharion Dove RVS  Examination Guidelines: A complete evaluation includes B-mode imaging, spectral Doppler, color Doppler, and power Doppler as needed of all accessible portions of each vessel. Bilateral testing is considered an integral part of a complete examination. Limited examinations for reoccurring indications may be performed as noted.  Right Carotid Findings: +----------+--------+--------+--------+--------+--------+           PSV cm/sEDV cm/sStenosisDescribeComments +----------+--------+--------+--------+--------+--------+ CCA Prox  68      24                               +----------+--------+--------+--------+--------+--------+ CCA Distal93      31                                +----------+--------+--------+--------+--------+--------+ ICA Prox  83      25                               +----------+--------+--------+--------+--------+--------+ ICA Distal89      32                               +----------+--------+--------+--------+--------+--------+  ECA       122     24                               +----------+--------+--------+--------+--------+--------+ Portions of this table do not appear on this page. +----------+--------+-------+--------+------------+           PSV cm/sEDV cmsDescribeArm Pressure +----------+--------+-------+--------+------------+ Subclavian69                                  +----------+--------+-------+--------+------------+ +---------+--------+--+--------+--+ VertebralPSV cm/s55EDV cm/s19 +---------+--------+--+--------+--+ Left Carotid Findings: +----------+--------+--------+--------+--------+------------------+           PSV cm/sEDV cm/sStenosisDescribeComments           +----------+--------+--------+--------+--------+------------------+ CCA Prox  108     32                      intimal thickening +----------+--------+--------+--------+--------+------------------+ CCA Distal114     34                      intimal thickening +----------+--------+--------+--------+--------+------------------+ ICA Prox  58      27                                         +----------+--------+--------+--------+--------+------------------+ ICA Distal68      28                                         +----------+--------+--------+--------+--------+------------------+ ECA       68      11                                         +----------+--------+--------+--------+--------+------------------+ +----------+--------+--------+--------+------------+ SubclavianPSV cm/sEDV cm/sDescribeArm Pressure +----------+--------+--------+--------+------------+           51                                    +----------+--------+--------+--------+------------+ +---------+--------+--+--------+--+ VertebralPSV cm/s47EDV cm/s21 +---------+--------+--+--------+--+  ABI Findings: +--------+------------------+-----+-----------+--------+ Right   Rt Pressure (mmHg)IndexWaveform   Comment  +--------+------------------+-----+-----------+--------+ Brachial130                    multiphasic         +--------+------------------+-----+-----------+--------+ PTA                            multiphasic         +--------+------------------+-----+-----------+--------+ DP                             multiphasic         +--------+------------------+-----+-----------+--------+ +--------+------------------+-----+-----------+-------+ Left    Lt Pressure (mmHg)IndexWaveform   Comment +--------+------------------+-----+-----------+-------+ [id number]                    multiphasic        +--------+------------------+-----+-----------+-------+ PTA  multiphasic        +--------+------------------+-----+-----------+-------+ DP                             multiphasic        +--------+------------------+-----+-----------+-------+  Right Doppler Findings: +-----------+--------+-----+-----------+---------------------------------------+ Site       PressureIndexDoppler    Comments                                +-----------+--------+-----+-----------+---------------------------------------+ Brachial   130          multiphasic                                        +-----------+--------+-----+-----------+---------------------------------------+ Radial                  multiphasic                                        +-----------+--------+-----+-----------+---------------------------------------+ Ulnar                   multiphasic                                        +-----------+--------+-----+-----------+---------------------------------------+  Palmar Arch                        Doppler signal remains normal with both                                    radial and ulnar compression            +-----------+--------+-----+-----------+---------------------------------------+  Left Doppler Findings: +-----------+--------+-----+-----------+---------------------------------------+ Site       PressureIndexDoppler    Comments                                +-----------+--------+-----+-----------+---------------------------------------+ Brachial   136          multiphasic                                        +-----------+--------+-----+-----------+---------------------------------------+ Radial                  multiphasic                                        +-----------+--------+-----+-----------+---------------------------------------+ Ulnar                   multiphasic                                        +-----------+--------+-----+-----------+---------------------------------------+ Palmar Arch                        Doppler  signal reverse with ulnar                                          compression and increases with radial                                      compression                             +-----------+--------+-----+-----------+---------------------------------------+  Summary: Right Carotid: The extracranial vessels were near-normal with only minimal wall                thickening or plaque. Left Carotid: The extracranial vessels were near-normal with only minimal wall               thickening or plaque. Vertebrals:  Bilateral vertebral arteries demonstrate antegrade flow. Subclavians: Normal flow hemodynamics were seen in bilateral subclavian              arteries. Right Upper Extremity: Doppler waveforms remain within normal limits with right radial compression. Doppler waveforms remain within normal limits with right ulnar compression. Left Upper Extremity: Doppler waveforms remain  within normal limits with left radial compression. Doppler waveforms remain within normal limits with left ulnar compression.     Preliminary     Telemetry    SR, no sig ectopy- Personally Reviewed  ECG    No new tracing as of 08/08/20- Personally Reviewed  Patient Profile     60 y.o. male with hx of CAD/NSTEMI s/p DES to [facility] with residual moderate LAD disease in 0272, chronic diastolic HF, HTN, HLD, CKD now with with cath for angina, associated SOB, Cath with significant CAD and is being evaluated for CABG  Assessment & Plan    1. CAD with NSTEMI: - TCTS following, for CABG in am - continue ASA, high-dose statin, Coreg 12.5 mg bid, Imdur 30 mg qd  - Plavix d/c'd for CABG  2. HTN: - see above meds, plus Lasix 40 mg qd - BP generally well-controlled  3. Chronic diastolic CHF: - wt down 2 kg since admit - on home dose Lasix  4. HLD: - on Lipitor 40 mg qd pta - LDL not at goal at 90 - Lipitor increased to 80 mg qd  5. CKD stage II: - Cr w/ mild increase, BUN ok, GFR > 60  6. Hypokalemia: - supplemented and improved  Rosaria Ferries, PA-C 08/08/2020

## 2020-08-08 NOTE — Care Management Important Message (Signed)
Important Message  Patient Details  Name: Lonnie Skinner MRN: [id number] Date of Birth: 1960-08-12   Medicare Important Message Given:  Yes     Shelda Altes 08/08/2020, 11:45 AM

## 2020-08-08 NOTE — Progress Notes (Signed)
VASCULAR LAB    Pre CABG Dopplers have been performed. See CV proc for preliminary results.   Lanisa Ishler, RVT 08/08/2020, 9:54 AM

## 2020-08-09 ENCOUNTER — Inpatient Hospital Stay (HOSPITAL_COMMUNITY): Payer: Medicare Other

## 2020-08-09 ENCOUNTER — Inpatient Hospital Stay (HOSPITAL_COMMUNITY)
Admission: AD | Disposition: A | Discharge status: Home / Self Care | Payer: Self-pay | Source: Home / Self Care | Attending: Cardiology

## 2020-08-09 ENCOUNTER — Inpatient Hospital Stay (HOSPITAL_COMMUNITY): Payer: Medicare Other | Admitting: Registered Nurse

## 2020-08-09 ENCOUNTER — Encounter (HOSPITAL_COMMUNITY): Payer: Self-pay | Admitting: Cardiology

## 2020-08-09 DIAGNOSIS — Z951 Presence of aortocoronary bypass graft: Secondary | ICD-10-CM

## 2020-08-09 HISTORY — PX: CORONARY ARTERY BYPASS GRAFT: SHX141

## 2020-08-09 HISTORY — PX: TEE WITHOUT CARDIOVERSION: SHX5443

## 2020-08-09 LAB — POCT I-STAT 7, (LYTES, BLD GAS, ICA,H+H)
Acid-Base Excess: 0 mmol/L (ref 0.0–2.0)
Acid-Base Excess: 4 mmol/L — ABNORMAL HIGH (ref 0.0–2.0)
Acid-Base Excess: 4 mmol/L — ABNORMAL HIGH (ref 0.0–2.0)
Acid-Base Excess: 2 mmol/L (ref 0.0–2.0)
Acid-Base Excess: 0 mmol/L (ref 0.0–2.0)
Acid-base deficit: 1 mmol/L (ref 0.0–2.0)
Acid-base deficit: 1 mmol/L (ref 0.0–2.0)
Bicarbonate: 25.4 mmol/L (ref 20.0–28.0)
Bicarbonate: 28.9 mmol/L — ABNORMAL HIGH (ref 20.0–28.0)
Bicarbonate: 28.1 mmol/L — ABNORMAL HIGH (ref 20.0–28.0)
Bicarbonate: 26.3 mmol/L (ref 20.0–28.0)
Bicarbonate: 25.4 mmol/L (ref 20.0–28.0)
Bicarbonate: 23.5 mmol/L (ref 20.0–28.0)
Bicarbonate: 24.8 mmol/L (ref 20.0–28.0)
Calcium, Ion: 1.16 mmol/L (ref 1.15–1.40)
Calcium, Ion: 1.01 mmol/L — ABNORMAL LOW (ref 1.15–1.40)
Calcium, Ion: 1.1 mmol/L — ABNORMAL LOW (ref 1.15–1.40)
Calcium, Ion: 1.43 mmol/L — ABNORMAL HIGH (ref 1.15–1.40)
Calcium, Ion: 1.34 mmol/L (ref 1.15–1.40)
Calcium, Ion: 1.25 mmol/L (ref 1.15–1.40)
Calcium, Ion: 1.3 mmol/L (ref 1.15–1.40)
HCT: 33 % — ABNORMAL LOW (ref 39.0–52.0)
HCT: 27 % — ABNORMAL LOW (ref 39.0–52.0)
HCT: 27 % — ABNORMAL LOW (ref 39.0–52.0)
HCT: 29 % — ABNORMAL LOW (ref 39.0–52.0)
HCT: 34 % — ABNORMAL LOW (ref 39.0–52.0)
HCT: 34 % — ABNORMAL LOW (ref 39.0–52.0)
HCT: 35 % — ABNORMAL LOW (ref 39.0–52.0)
Hemoglobin: 11.2 g/dL — ABNORMAL LOW (ref 13.0–17.0)
Hemoglobin: 9.2 g/dL — ABNORMAL LOW (ref 13.0–17.0)
Hemoglobin: 9.2 g/dL — ABNORMAL LOW (ref 13.0–17.0)
Hemoglobin: 9.9 g/dL — ABNORMAL LOW (ref 13.0–17.0)
Hemoglobin: 11.6 g/dL — ABNORMAL LOW (ref 13.0–17.0)
Hemoglobin: 11.6 g/dL — ABNORMAL LOW (ref 13.0–17.0)
Hemoglobin: 11.9 g/dL — ABNORMAL LOW (ref 13.0–17.0)
O2 Saturation: 100 %
O2 Saturation: 100 %
O2 Saturation: 100 %
O2 Saturation: 98 %
O2 Saturation: 100 %
O2 Saturation: 99 %
O2 Saturation: 99 %
Patient temperature: 97.9
Patient temperature: 36.4
Patient temperature: 36.9
Potassium: 3.6 mmol/L (ref 3.5–5.1)
Potassium: 4.4 mmol/L (ref 3.5–5.1)
Potassium: 6 mmol/L — ABNORMAL HIGH (ref 3.5–5.1)
Potassium: 4.9 mmol/L (ref 3.5–5.1)
Potassium: 4.3 mmol/L (ref 3.5–5.1)
Potassium: 4.2 mmol/L (ref 3.5–5.1)
Potassium: 4.4 mmol/L (ref 3.5–5.1)
Sodium: 139 mmol/L (ref 135–145)
Sodium: 139 mmol/L (ref 135–145)
Sodium: 136 mmol/L (ref 135–145)
Sodium: 138 mmol/L (ref 135–145)
Sodium: 139 mmol/L (ref 135–145)
Sodium: 139 mmol/L (ref 135–145)
Sodium: 138 mmol/L (ref 135–145)
TCO2: 27 mmol/L (ref 22–32)
TCO2: 30 mmol/L (ref 22–32)
TCO2: 29 mmol/L (ref 22–32)
TCO2: 28 mmol/L (ref 22–32)
TCO2: 27 mmol/L (ref 22–32)
TCO2: 25 mmol/L (ref 22–32)
TCO2: 26 mmol/L (ref 22–32)
pCO2 arterial: 41.1 mmHg (ref 32.0–48.0)
pCO2 arterial: 42.4 mmHg (ref 32.0–48.0)
pCO2 arterial: 42 mmHg (ref 32.0–48.0)
pCO2 arterial: 41.3 mmHg (ref 32.0–48.0)
pCO2 arterial: 40.1 mmHg (ref 32.0–48.0)
pCO2 arterial: 37.2 mmHg (ref 32.0–48.0)
pCO2 arterial: 46.8 mmHg (ref 32.0–48.0)
pH, Arterial: 7.398 (ref 7.350–7.450)
pH, Arterial: 7.442 (ref 7.350–7.450)
pH, Arterial: 7.433 (ref 7.350–7.450)
pH, Arterial: 7.412 (ref 7.350–7.450)
pH, Arterial: 7.407 (ref 7.350–7.450)
pH, Arterial: 7.406 (ref 7.350–7.450)
pH, Arterial: 7.332 — ABNORMAL LOW (ref 7.350–7.450)
pO2, Arterial: 452 mmHg — ABNORMAL HIGH (ref 83.0–108.0)
pO2, Arterial: 384 mmHg — ABNORMAL HIGH (ref 83.0–108.0)
pO2, Arterial: 306 mmHg — ABNORMAL HIGH (ref 83.0–108.0)
pO2, Arterial: 96 mmHg (ref 83.0–108.0)
pO2, Arterial: 167 mmHg — ABNORMAL HIGH (ref 83.0–108.0)
pO2, Arterial: 133 mmHg — ABNORMAL HIGH (ref 83.0–108.0)
pO2, Arterial: 141 mmHg — ABNORMAL HIGH (ref 83.0–108.0)

## 2020-08-09 LAB — CBC
HCT: 39.3 % (ref 39.0–52.0)
HCT: 35.3 % — ABNORMAL LOW (ref 39.0–52.0)
HCT: 35.3 % — ABNORMAL LOW (ref 39.0–52.0)
Hemoglobin: 14 g/dL (ref 13.0–17.0)
Hemoglobin: 12.3 g/dL — ABNORMAL LOW (ref 13.0–17.0)
Hemoglobin: 12.5 g/dL — ABNORMAL LOW (ref 13.0–17.0)
MCH: 32.3 pg (ref 26.0–34.0)
MCH: 31.8 pg (ref 26.0–34.0)
MCH: 32.1 pg (ref 26.0–34.0)
MCHC: 35.6 g/dL (ref 30.0–36.0)
MCHC: 34.8 g/dL (ref 30.0–36.0)
MCHC: 35.4 g/dL (ref 30.0–36.0)
MCV: 90.6 fL (ref 80.0–100.0)
MCV: 91.2 fL (ref 80.0–100.0)
MCV: 90.7 fL (ref 80.0–100.0)
Platelets: 220 10*3/uL (ref 150–400)
Platelets: 157 10*3/uL (ref 150–400)
Platelets: 188 10*3/uL (ref 150–400)
RBC: 4.34 MIL/uL (ref 4.22–5.81)
RBC: 3.87 MIL/uL — ABNORMAL LOW (ref 4.22–5.81)
RBC: 3.89 MIL/uL — ABNORMAL LOW (ref 4.22–5.81)
RDW: 13.4 % (ref 11.5–15.5)
RDW: 13.3 % (ref 11.5–15.5)
RDW: 13.6 % (ref 11.5–15.5)
WBC: 8.6 10*3/uL (ref 4.0–10.5)
WBC: 11.3 10*3/uL — ABNORMAL HIGH (ref 4.0–10.5)
WBC: 14.8 10*3/uL — ABNORMAL HIGH (ref 4.0–10.5)
nRBC: 0 % (ref 0.0–0.2)
nRBC: 0 % (ref 0.0–0.2)
nRBC: 0 % (ref 0.0–0.2)

## 2020-08-09 LAB — POCT I-STAT, CHEM 8
BUN: 23 mg/dL — ABNORMAL HIGH (ref 6–20)
BUN: 25 mg/dL — ABNORMAL HIGH (ref 6–20)
BUN: 24 mg/dL — ABNORMAL HIGH (ref 6–20)
BUN: 24 mg/dL — ABNORMAL HIGH (ref 6–20)
BUN: 25 mg/dL — ABNORMAL HIGH (ref 6–20)
Calcium, Ion: 1.15 mmol/L (ref 1.15–1.40)
Calcium, Ion: 1.24 mmol/L (ref 1.15–1.40)
Calcium, Ion: 1.15 mmol/L (ref 1.15–1.40)
Calcium, Ion: 1.13 mmol/L — ABNORMAL LOW (ref 1.15–1.40)
Calcium, Ion: 1.36 mmol/L (ref 1.15–1.40)
Chloride: 104 mmol/L (ref 98–111)
Chloride: 103 mmol/L (ref 98–111)
Chloride: 102 mmol/L (ref 98–111)
Chloride: 103 mmol/L (ref 98–111)
Chloride: 104 mmol/L (ref 98–111)
Creatinine, Ser: 0.8 mg/dL (ref 0.61–1.24)
Creatinine, Ser: 0.9 mg/dL (ref 0.61–1.24)
Creatinine, Ser: 0.9 mg/dL (ref 0.61–1.24)
Creatinine, Ser: 1 mg/dL (ref 0.61–1.24)
Creatinine, Ser: 1 mg/dL (ref 0.61–1.24)
Glucose, Bld: 116 mg/dL — ABNORMAL HIGH (ref 70–99)
Glucose, Bld: 105 mg/dL — ABNORMAL HIGH (ref 70–99)
Glucose, Bld: 97 mg/dL (ref 70–99)
Glucose, Bld: 109 mg/dL — ABNORMAL HIGH (ref 70–99)
Glucose, Bld: 122 mg/dL — ABNORMAL HIGH (ref 70–99)
HCT: 35 % — ABNORMAL LOW (ref 39.0–52.0)
HCT: 36 % — ABNORMAL LOW (ref 39.0–52.0)
HCT: 29 % — ABNORMAL LOW (ref 39.0–52.0)
HCT: 29 % — ABNORMAL LOW (ref 39.0–52.0)
HCT: 31 % — ABNORMAL LOW (ref 39.0–52.0)
Hemoglobin: 11.9 g/dL — ABNORMAL LOW (ref 13.0–17.0)
Hemoglobin: 12.2 g/dL — ABNORMAL LOW (ref 13.0–17.0)
Hemoglobin: 9.9 g/dL — ABNORMAL LOW (ref 13.0–17.0)
Hemoglobin: 10.5 g/dL — ABNORMAL LOW (ref 13.0–17.0)
Hemoglobin: 9.9 g/dL — ABNORMAL LOW (ref 13.0–17.0)
Potassium: 3.6 mmol/L (ref 3.5–5.1)
Potassium: 4.2 mmol/L (ref 3.5–5.1)
Potassium: 5.3 mmol/L — ABNORMAL HIGH (ref 3.5–5.1)
Potassium: 4.8 mmol/L (ref 3.5–5.1)
Potassium: 5.6 mmol/L — ABNORMAL HIGH (ref 3.5–5.1)
Sodium: 139 mmol/L (ref 135–145)
Sodium: 137 mmol/L (ref 135–145)
Sodium: 138 mmol/L (ref 135–145)
Sodium: 136 mmol/L (ref 135–145)
Sodium: 138 mmol/L (ref 135–145)
TCO2: 22 mmol/L (ref 22–32)
TCO2: 26 mmol/L (ref 22–32)
TCO2: 28 mmol/L (ref 22–32)
TCO2: 24 mmol/L (ref 22–32)
TCO2: 28 mmol/L (ref 22–32)

## 2020-08-09 LAB — BASIC METABOLIC PANEL
Anion gap: 11 (ref 5–15)
Anion gap: 6 (ref 5–15)
BUN: 27 mg/dL — ABNORMAL HIGH (ref 6–20)
BUN: 21 mg/dL — ABNORMAL HIGH (ref 6–20)
CO2: 28 mmol/L (ref 22–32)
CO2: 24 mmol/L (ref 22–32)
Calcium: 9.3 mg/dL (ref 8.9–10.3)
Calcium: 8.7 mg/dL — ABNORMAL LOW (ref 8.9–10.3)
Chloride: 96 mmol/L — ABNORMAL LOW (ref 98–111)
Chloride: 106 mmol/L (ref 98–111)
Creatinine, Ser: 1.32 mg/dL — ABNORMAL HIGH (ref 0.61–1.24)
Creatinine, Ser: 1.08 mg/dL (ref 0.61–1.24)
GFR, Estimated: >60 mL/min (ref >60)
GFR, Estimated: >60 mL/min (ref >60)
Glucose, Bld: 90 mg/dL (ref 70–99)
Glucose, Bld: 155 mg/dL — ABNORMAL HIGH (ref 70–99)
Potassium: 3.6 mmol/L (ref 3.5–5.1)
Potassium: 4.6 mmol/L (ref 3.5–5.1)
Sodium: 135 mmol/L (ref 135–145)
Sodium: 136 mmol/L (ref 135–145)

## 2020-08-09 LAB — GLUCOSE, CAPILLARY
Glucose-Capillary: 125 mg/dL — ABNORMAL HIGH (ref 70–99)
Glucose-Capillary: 138 mg/dL — ABNORMAL HIGH (ref 70–99)
Glucose-Capillary: 72 mg/dL (ref 70–99)
Glucose-Capillary: 96 mg/dL (ref 70–99)
Glucose-Capillary: 98 mg/dL (ref 70–99)

## 2020-08-09 LAB — APTT
aPTT: 66 seconds — ABNORMAL HIGH (ref 24–36)
aPTT: 38 seconds — ABNORMAL HIGH (ref 24–36)

## 2020-08-09 LAB — URINALYSIS, ROUTINE W REFLEX MICROSCOPIC
Bilirubin Urine: NEGATIVE
Glucose, UA: NEGATIVE mg/dL
Hgb urine dipstick: NEGATIVE
Ketones, ur: NEGATIVE mg/dL
Leukocytes,Ua: NEGATIVE
Nitrite: NEGATIVE
Protein, ur: NEGATIVE mg/dL
Specific Gravity, Urine: 1.017 (ref 1.005–1.030)
pH: 5 (ref 5.0–8.0)

## 2020-08-09 LAB — POCT I-STAT EG7
Acid-Base Excess: 3 mmol/L — ABNORMAL HIGH (ref 0.0–2.0)
Bicarbonate: 28.2 mmol/L — ABNORMAL HIGH (ref 20.0–28.0)
Calcium, Ion: 1.08 mmol/L — ABNORMAL LOW (ref 1.15–1.40)
HCT: 27 % — ABNORMAL LOW (ref 39.0–52.0)
Hemoglobin: 9.2 g/dL — ABNORMAL LOW (ref 13.0–17.0)
O2 Saturation: 87 %
Potassium: 6.1 mmol/L — ABNORMAL HIGH (ref 3.5–5.1)
Sodium: 136 mmol/L (ref 135–145)
TCO2: 30 mmol/L (ref 22–32)
pCO2, Ven: 48.3 mmHg (ref 44.0–60.0)
pH, Ven: 7.374 (ref 7.250–7.430)
pO2, Ven: 55 mmHg — ABNORMAL HIGH (ref 32.0–45.0)

## 2020-08-09 LAB — PLATELET COUNT: Platelets: 192 10*3/uL (ref 150–400)

## 2020-08-09 LAB — MAGNESIUM: Magnesium: 3.2 mg/dL — ABNORMAL HIGH (ref 1.7–2.4)

## 2020-08-09 LAB — PROTIME-INR
INR: 1.1 (ref 0.8–1.2)
INR: 1.6 — ABNORMAL HIGH (ref 0.8–1.2)
Prothrombin Time: 13.5 seconds (ref 11.4–15.2)
Prothrombin Time: 18.6 seconds — ABNORMAL HIGH (ref 11.4–15.2)

## 2020-08-09 LAB — ECHO INTRAOPERATIVE TEE
Height: 68 in
Weight: 2971.2 oz

## 2020-08-09 LAB — HEMOGLOBIN AND HEMATOCRIT, BLOOD
HCT: 27.3 % — ABNORMAL LOW (ref 39.0–52.0)
Hemoglobin: 9.6 g/dL — ABNORMAL LOW (ref 13.0–17.0)

## 2020-08-09 LAB — HEMOGLOBIN A1C
Hgb A1c MFr Bld: 5.4 % (ref 4.8–5.6)
Mean Plasma Glucose: 108.28 mg/dL

## 2020-08-09 LAB — HEPARIN LEVEL (UNFRACTIONATED): Heparin Unfractionated: 0.31 IU/mL (ref 0.30–0.70)

## 2020-08-09 LAB — SURGICAL PCR SCREEN
MRSA, PCR: NEGATIVE
Staphylococcus aureus: POSITIVE — AB

## 2020-08-09 SURGERY — CORONARY ARTERY BYPASS GRAFTING (CABG)
Anesthesia: General | Site: Chest

## 2020-08-09 MED ORDER — DEXMEDETOMIDINE HCL IN NACL 400 MCG/100ML IV SOLN
0.0000 ug/kg/h | INTRAVENOUS | Status: DC
  Administered 2020-08-09: 0.5 ug/kg/h via INTRAVENOUS

## 2020-08-09 MED ORDER — METOPROLOL TARTRATE 5 MG/5ML IV SOLN
2.5000 mg | INTRAVENOUS | Status: DC | PRN
Start: 2020-08-09 — End: 2020-08-14

## 2020-08-09 MED ORDER — LACTATED RINGERS IV SOLN: INTRAVENOUS | Status: DC

## 2020-08-09 MED ORDER — ATORVASTATIN CALCIUM 80 MG PO TABS
80.0000 mg | ORAL_TABLET | Freq: Every evening | ORAL | Status: DC
  Administered 2020-08-10 – 2020-08-13 (×4): 80 mg via ORAL
  Filled 2020-08-09 (×4): qty 1

## 2020-08-09 MED ORDER — FAMOTIDINE IN NACL 20-0.9 MG/50ML-% IV SOLN
20.0000 mg | Freq: Two times a day (BID) | INTRAVENOUS | Status: AC
  Administered 2020-08-09 (×2): 20 mg via INTRAVENOUS
  Filled 2020-08-09 (×2): qty 50

## 2020-08-09 MED ORDER — LACTATED RINGERS IV SOLN
500.0000 mL | Freq: Once | INTRAVENOUS | Status: AC | PRN
  Administered 2020-08-09: 500 mL via INTRAVENOUS

## 2020-08-09 MED ORDER — STERILE WATER FOR IRRIGATION IR SOLN
Status: DC | PRN
  Administered 2020-08-09: 2000 mL

## 2020-08-09 MED ORDER — PLASMA-LYTE 148 IV SOLN
INTRAVENOUS | Status: DC | PRN
  Administered 2020-08-09: 500 mL

## 2020-08-09 MED ORDER — PHENYLEPHRINE 40 MCG/ML (10ML) SYRINGE FOR IV PUSH (FOR BLOOD PRESSURE SUPPORT)
PREFILLED_SYRINGE | INTRAVENOUS | Status: DC | PRN
  Administered 2020-08-09: 120 ug via INTRAVENOUS
  Administered 2020-08-09: 20 ug via INTRAVENOUS
  Administered 2020-08-09: 80 ug via INTRAVENOUS

## 2020-08-09 MED ORDER — FENTANYL CITRATE (PF) 250 MCG/5ML IJ SOLN
INTRAMUSCULAR | Status: AC
  Filled 2020-08-09: qty 5

## 2020-08-09 MED ORDER — INSULIN ASPART 100 UNIT/ML ~~LOC~~ SOLN
0.0000 [IU] | SUBCUTANEOUS | Status: DC
  Administered 2020-08-09 (×2): 2 [IU] via SUBCUTANEOUS
  Administered 2020-08-10: 4 [IU] via SUBCUTANEOUS
  Administered 2020-08-10 – 2020-08-11 (×6): 2 [IU] via SUBCUTANEOUS

## 2020-08-09 MED ORDER — PROPOFOL 10 MG/ML IV BOLUS
INTRAVENOUS | Status: AC
  Filled 2020-08-09: qty 20

## 2020-08-09 MED ORDER — VANCOMYCIN HCL IN DEXTROSE 1-5 GM/200ML-% IV SOLN
1000.0000 mg | Freq: Once | INTRAVENOUS | Status: AC
  Administered 2020-08-09: 1000 mg via INTRAVENOUS
  Filled 2020-08-09: qty 200

## 2020-08-09 MED ORDER — NICARDIPINE HCL IN NACL 20-0.86 MG/200ML-% IV SOLN
5.0000 mg/h | INTRAVENOUS | Status: DC
  Filled 2020-08-09: qty 200

## 2020-08-09 MED ORDER — POTASSIUM CHLORIDE 10 MEQ/50ML IV SOLN: 10.0000 meq | INTRAVENOUS | Status: AC

## 2020-08-09 MED ORDER — ASPIRIN 81 MG PO CHEW: 324.0000 mg | CHEWABLE_TABLET | Freq: Every day | ORAL | Status: DC

## 2020-08-09 MED ORDER — ARTIFICIAL TEARS OPHTHALMIC OINT
TOPICAL_OINTMENT | OPHTHALMIC | Status: DC | PRN
  Administered 2020-08-09: 1 via OPHTHALMIC

## 2020-08-09 MED ORDER — ALBUMIN HUMAN 5 % IV SOLN
250.0000 mL | INTRAVENOUS | Status: AC | PRN
Start: 2020-08-09 — End: 2020-08-10
  Administered 2020-08-09 – 2020-08-10 (×4): 12.5 g via INTRAVENOUS
  Filled 2020-08-09 (×2): qty 250

## 2020-08-09 MED ORDER — DOCUSATE SODIUM 100 MG PO CAPS
200.0000 mg | ORAL_CAPSULE | Freq: Every day | ORAL | Status: DC
  Administered 2020-08-10 – 2020-08-11 (×2): 200 mg via ORAL
  Filled 2020-08-09 (×2): qty 2

## 2020-08-09 MED ORDER — ACETAMINOPHEN 650 MG RE SUPP
650.0000 mg | Freq: Once | RECTAL | Status: AC
  Administered 2020-08-09: 650 mg via RECTAL

## 2020-08-09 MED ORDER — OXYCODONE HCL 5 MG PO TABS: 5.0000 mg | ORAL_TABLET | ORAL | Status: DC | PRN

## 2020-08-09 MED ORDER — SODIUM CHLORIDE 0.9% FLUSH: 3.0000 mL | INTRAVENOUS | Status: DC | PRN

## 2020-08-09 MED ORDER — MIDAZOLAM HCL 5 MG/5ML IJ SOLN
INTRAMUSCULAR | Status: DC | PRN
  Administered 2020-08-09: 1 mg via INTRAVENOUS
  Administered 2020-08-09 (×2): 2 mg via INTRAVENOUS
  Administered 2020-08-09 (×3): 1 mg via INTRAVENOUS
  Administered 2020-08-09: 2 mg via INTRAVENOUS

## 2020-08-09 MED ORDER — ALPRAZOLAM 0.25 MG PO TABS
0.2500 mg | ORAL_TABLET | Freq: Three times a day (TID) | ORAL | Status: DC | PRN
  Administered 2020-08-10 – 2020-08-13 (×6): 0.25 mg via ORAL
  Filled 2020-08-09 (×6): qty 1

## 2020-08-09 MED ORDER — METOPROLOL TARTRATE 25 MG/10 ML ORAL SUSPENSION
12.5000 mg | Freq: Two times a day (BID) | ORAL | Status: DC
  Administered 2020-08-11: 12.5 mg
  Filled 2020-08-09 (×7): qty 5

## 2020-08-09 MED ORDER — DOBUTAMINE IN D5W 4-5 MG/ML-% IV SOLN: 2.5000 ug/kg/min | INTRAVENOUS | Status: DC

## 2020-08-09 MED ORDER — MORPHINE SULFATE (PF) 2 MG/ML IV SOLN
1.0000 mg | INTRAVENOUS | Status: DC | PRN
  Administered 2020-08-09 – 2020-08-10 (×3): 2 mg via INTRAVENOUS
  Filled 2020-08-09 (×3): qty 1

## 2020-08-09 MED ORDER — NOREPINEPHRINE 4 MG/250ML-% IV SOLN: 0.0000 ug/min | INTRAVENOUS | Status: DC

## 2020-08-09 MED ORDER — 0.9 % SODIUM CHLORIDE (POUR BTL) OPTIME
TOPICAL | Status: DC | PRN
  Administered 2020-08-09: 1000 mL
  Administered 2020-08-09: 3000 mL

## 2020-08-09 MED ORDER — MAGNESIUM SULFATE 4 GM/100ML IV SOLN
4.0000 g | Freq: Once | INTRAVENOUS | Status: AC
  Administered 2020-08-09: 4 g via INTRAVENOUS
  Filled 2020-08-09: qty 100

## 2020-08-09 MED ORDER — CHLORHEXIDINE GLUCONATE 0.12 % MT SOLN
OROMUCOSAL | Status: AC
  Administered 2020-08-09: 15 mL via OROMUCOSAL
  Filled 2020-08-09: qty 15

## 2020-08-09 MED ORDER — CHLORHEXIDINE GLUCONATE 0.12 % MT SOLN: 15.0000 mL | OROMUCOSAL | Status: AC

## 2020-08-09 MED ORDER — DEXTROSE 50 % IV SOLN: 0.0000 mL | INTRAVENOUS | Status: DC | PRN

## 2020-08-09 MED ORDER — INSULIN REGULAR(HUMAN) IN NACL 100-0.9 UT/100ML-% IV SOLN: INTRAVENOUS | Status: DC

## 2020-08-09 MED ORDER — MIDAZOLAM HCL (PF) 10 MG/2ML IJ SOLN
INTRAMUSCULAR | Status: AC
  Filled 2020-08-09: qty 2

## 2020-08-09 MED ORDER — PROPOFOL 10 MG/ML IV BOLUS
INTRAVENOUS | Status: DC | PRN
  Administered 2020-08-09: 50 mg via INTRAVENOUS

## 2020-08-09 MED ORDER — LACTATED RINGERS IV SOLN: INTRAVENOUS | Status: DC | PRN

## 2020-08-09 MED ORDER — SODIUM CHLORIDE 0.9% FLUSH
3.0000 mL | Freq: Two times a day (BID) | INTRAVENOUS | Status: DC
  Administered 2020-08-10 – 2020-08-11 (×3): 3 mL via INTRAVENOUS

## 2020-08-09 MED ORDER — SODIUM CHLORIDE 0.9 % IV SOLN: 250.0000 mL | INTRAVENOUS | Status: DC

## 2020-08-09 MED ORDER — TRAMADOL HCL 50 MG PO TABS
50.0000 mg | ORAL_TABLET | ORAL | Status: DC | PRN
  Administered 2020-08-10 – 2020-08-11 (×5): 50 mg via ORAL
  Administered 2020-08-12 – 2020-08-14 (×2): 100 mg via ORAL
  Filled 2020-08-09 (×3): qty 1
  Filled 2020-08-09 (×2): qty 2
  Filled 2020-08-09 (×2): qty 1

## 2020-08-09 MED ORDER — CHLORHEXIDINE GLUCONATE CLOTH 2 % EX PADS: 6.0000 | MEDICATED_PAD | Freq: Every day | CUTANEOUS | Status: DC

## 2020-08-09 MED ORDER — ROCURONIUM BROMIDE 10 MG/ML (PF) SYRINGE
PREFILLED_SYRINGE | INTRAVENOUS | Status: DC | PRN
  Administered 2020-08-09 (×2): 50 mg via INTRAVENOUS
  Administered 2020-08-09: 60 mg via INTRAVENOUS
  Administered 2020-08-09: 40 mg via INTRAVENOUS

## 2020-08-09 MED ORDER — MUPIROCIN 2 % EX OINT
1.0000 "application " | TOPICAL_OINTMENT | Freq: Two times a day (BID) | CUTANEOUS | Status: DC
  Filled 2020-08-09: qty 22

## 2020-08-09 MED ORDER — ACETAMINOPHEN 160 MG/5ML PO SOLN
1000.0000 mg | Freq: Four times a day (QID) | ORAL | Status: DC
  Administered 2020-08-10 – 2020-08-13 (×11): 1000 mg
  Filled 2020-08-09 (×11): qty 40.6

## 2020-08-09 MED ORDER — BISACODYL 10 MG RE SUPP: 10.0000 mg | Freq: Every day | RECTAL | Status: DC

## 2020-08-09 MED ORDER — FENTANYL CITRATE (PF) 250 MCG/5ML IJ SOLN
INTRAMUSCULAR | Status: DC | PRN
  Administered 2020-08-09: 100 ug via INTRAVENOUS
  Administered 2020-08-09: 400 ug via INTRAVENOUS
  Administered 2020-08-09: 50 ug via INTRAVENOUS
  Administered 2020-08-09 (×4): 100 ug via INTRAVENOUS
  Administered 2020-08-09: 150 ug via INTRAVENOUS
  Administered 2020-08-09: 50 ug via INTRAVENOUS
  Administered 2020-08-09: 100 ug via INTRAVENOUS

## 2020-08-09 MED ORDER — EPHEDRINE SULFATE 50 MG/ML IJ SOLN
INTRAMUSCULAR | Status: DC | PRN
  Administered 2020-08-09: 5 mg via INTRAVENOUS

## 2020-08-09 MED ORDER — SODIUM CHLORIDE 0.9% FLUSH
10.0000 mL | INTRAVENOUS | Status: DC | PRN
Start: 2020-08-09 — End: 2020-08-11

## 2020-08-09 MED ORDER — CHLORHEXIDINE GLUCONATE CLOTH 2 % EX PADS
6.0000 | MEDICATED_PAD | Freq: Every day | CUTANEOUS | Status: DC
  Administered 2020-08-09 – 2020-08-11 (×3): 6 via TOPICAL

## 2020-08-09 MED ORDER — ACETAMINOPHEN 160 MG/5ML PO SOLN: 650.0000 mg | Freq: Once | ORAL | Status: AC

## 2020-08-09 MED ORDER — ORAL CARE MOUTH RINSE
15.0000 mL | Freq: Two times a day (BID) | OROMUCOSAL | Status: DC
  Administered 2020-08-09 – 2020-08-14 (×8): 15 mL via OROMUCOSAL

## 2020-08-09 MED ORDER — SODIUM CHLORIDE 0.9 % IV SOLN: INTRAVENOUS | Status: DC

## 2020-08-09 MED ORDER — ASPIRIN EC 325 MG PO TBEC
325.0000 mg | DELAYED_RELEASE_TABLET | Freq: Every day | ORAL | Status: DC
  Administered 2020-08-10 – 2020-08-13 (×4): 325 mg via ORAL
  Filled 2020-08-09 (×4): qty 1

## 2020-08-09 MED ORDER — PROTAMINE SULFATE 10 MG/ML IV SOLN
INTRAVENOUS | Status: DC | PRN
  Administered 2020-08-09: 300 mg via INTRAVENOUS

## 2020-08-09 MED ORDER — SODIUM CHLORIDE (PF) 0.9 % IJ SOLN
OROMUCOSAL | Status: DC | PRN
  Administered 2020-08-09 (×3): 4 mL via TOPICAL

## 2020-08-09 MED ORDER — SODIUM CHLORIDE 0.45 % IV SOLN: INTRAVENOUS | Status: DC | PRN

## 2020-08-09 MED ORDER — SODIUM CHLORIDE (PF) 0.9 % IJ SOLN
INTRAMUSCULAR | Status: AC
  Filled 2020-08-09: qty 10

## 2020-08-09 MED ORDER — PANTOPRAZOLE SODIUM 40 MG PO TBEC
40.0000 mg | DELAYED_RELEASE_TABLET | Freq: Every day | ORAL | Status: DC
  Administered 2020-08-11 – 2020-08-14 (×4): 40 mg via ORAL
  Filled 2020-08-09 (×4): qty 1

## 2020-08-09 MED ORDER — MIDAZOLAM HCL 2 MG/2ML IJ SOLN
2.0000 mg | INTRAMUSCULAR | Status: DC | PRN
Start: 2020-08-09 — End: 2020-08-11

## 2020-08-09 MED ORDER — ONDANSETRON HCL 4 MG/2ML IJ SOLN
4.0000 mg | Freq: Four times a day (QID) | INTRAMUSCULAR | Status: DC | PRN
  Administered 2020-08-10 – 2020-08-14 (×4): 4 mg via INTRAVENOUS
  Filled 2020-08-09 (×4): qty 2

## 2020-08-09 MED ORDER — HEPARIN SODIUM (PORCINE) 1000 UNIT/ML IJ SOLN
INTRAMUSCULAR | Status: DC | PRN
  Administered 2020-08-09: 30000 [IU] via INTRAVENOUS

## 2020-08-09 MED ORDER — NITROGLYCERIN IN D5W 200-5 MCG/ML-% IV SOLN: 0.0000 ug/min | INTRAVENOUS | Status: DC

## 2020-08-09 MED ORDER — BISACODYL 5 MG PO TBEC
10.0000 mg | DELAYED_RELEASE_TABLET | Freq: Every day | ORAL | Status: DC
  Administered 2020-08-10 – 2020-08-11 (×2): 10 mg via ORAL
  Filled 2020-08-09 (×2): qty 2

## 2020-08-09 MED ORDER — METOPROLOL TARTRATE 12.5 MG HALF TABLET
12.5000 mg | ORAL_TABLET | Freq: Two times a day (BID) | ORAL | Status: DC
  Administered 2020-08-11 – 2020-08-14 (×6): 12.5 mg via ORAL
  Filled 2020-08-09 (×6): qty 1

## 2020-08-09 MED ORDER — ACETAMINOPHEN 500 MG PO TABS
1000.0000 mg | ORAL_TABLET | Freq: Four times a day (QID) | ORAL | Status: DC
  Administered 2020-08-10 – 2020-08-11 (×2): 1000 mg via ORAL
  Filled 2020-08-09 (×4): qty 2

## 2020-08-09 MED ORDER — LEVOFLOXACIN IN D5W 750 MG/150ML IV SOLN
750.0000 mg | INTRAVENOUS | Status: AC
Start: 2020-08-10 — End: 2020-08-10
  Administered 2020-08-10: 750 mg via INTRAVENOUS
  Filled 2020-08-09: qty 150

## 2020-08-09 MED ORDER — PHENYLEPHRINE HCL-NACL 20-0.9 MG/250ML-% IV SOLN
0.0000 ug/min | INTRAVENOUS | Status: DC
  Administered 2020-08-09: 20 ug/min via INTRAVENOUS
  Administered 2020-08-09: 30 ug/min via INTRAVENOUS
  Administered 2020-08-10: 80 ug/min via INTRAVENOUS
  Administered 2020-08-10: 60 ug/min via INTRAVENOUS
  Filled 2020-08-09 (×3): qty 250

## 2020-08-09 MED ORDER — SODIUM CHLORIDE 0.9% FLUSH
10.0000 mL | Freq: Two times a day (BID) | INTRAVENOUS | Status: DC
  Administered 2020-08-09: 40 mL
  Administered 2020-08-09 – 2020-08-11 (×4): 10 mL

## 2020-08-09 SURGICAL SUPPLY — 89 items
BAG DECANTER FOR FLEXI CONT (MISCELLANEOUS) ×3 IMPLANT
BLADE CLIPPER SURG (BLADE) ×4 IMPLANT
BLADE STERNUM SYSTEM 6 (BLADE) ×3 IMPLANT
BLADE SURG 11 STRL SS (BLADE) ×1 IMPLANT
BNDG ELASTIC 4X5.8 VLCR STR LF (GAUZE/BANDAGES/DRESSINGS) ×5 IMPLANT
BNDG ELASTIC 6X5.8 VLCR STR LF (GAUZE/BANDAGES/DRESSINGS) ×5 IMPLANT
BNDG GAUZE ELAST 4 BULKY (GAUZE/BANDAGES/DRESSINGS) ×4 IMPLANT
CABLE SURGICAL S-101-97-12 (CABLE) ×3 IMPLANT
CANISTER SUCT 3000ML PPV (MISCELLANEOUS) ×3 IMPLANT
CANNULA MC2 2 STG 29/37 NON-V (CANNULA) ×2 IMPLANT
CANNULA MC2 TWO STAGE (CANNULA) ×3
CANNULA NON VENT 20FR 12 (CANNULA) ×3 IMPLANT
CATH ROBINSON RED A/P 18FR (CATHETERS) ×6 IMPLANT
CLIP RETRACTION 3.0MM CORONARY (MISCELLANEOUS) ×3 IMPLANT
CLIP VESOCCLUDE MED 24/CT (CLIP) ×1 IMPLANT
CLIP VESOCCLUDE SM WIDE 24/CT (CLIP) IMPLANT
CONN 3/8X3/8 GISH STERILE (MISCELLANEOUS) ×1 IMPLANT
CONN ST 1/2X1/2  BEN (MISCELLANEOUS) ×3
CONN ST 1/2X1/2 BEN (MISCELLANEOUS) ×2 IMPLANT
CONNECTOR BLAKE 2:1 CARIO BLK (MISCELLANEOUS) ×3 IMPLANT
DEFOGGER ANTIFOG KIT (MISCELLANEOUS) ×1 IMPLANT
DRAIN CHANNEL 19F RND (DRAIN) ×8 IMPLANT
DRAIN CONNECTOR BLAKE 1:1 (MISCELLANEOUS) ×2 IMPLANT
DRAPE CARDIOVASCULAR INCISE (DRAPES) ×3
DRAPE INCISE IOBAN 66X45 STRL (DRAPES) IMPLANT
DRAPE SLUSH/WARMER DISC (DRAPES) ×2 IMPLANT
DRAPE SRG 135X102X78XABS (DRAPES) ×2 IMPLANT
DRSG AQUACEL AG ADV 3.5X10 (GAUZE/BANDAGES/DRESSINGS) ×3 IMPLANT
DRSG COVADERM 4X14 (GAUZE/BANDAGES/DRESSINGS) ×3 IMPLANT
ELECT BLADE 4.0 EZ CLEAN MEGAD (MISCELLANEOUS) ×3
ELECT REM PT RETURN 9FT ADLT (ELECTROSURGICAL) ×6
ELECTRODE BLDE 4.0 EZ CLN MEGD (MISCELLANEOUS) ×2 IMPLANT
ELECTRODE REM PT RTRN 9FT ADLT (ELECTROSURGICAL) ×4 IMPLANT
FELT TEFLON 1X6 (MISCELLANEOUS) ×5 IMPLANT
GAUZE SPONGE 4X4 12PLY STRL (GAUZE/BANDAGES/DRESSINGS) ×6 IMPLANT
GAUZE SPONGE 4X4 12PLY STRL LF (GAUZE/BANDAGES/DRESSINGS) ×3 IMPLANT
GLOVE BIO SURGEON STRL SZ7 (GLOVE) ×6 IMPLANT
GLOVE BIOGEL M STRL SZ7.5 (GLOVE) ×6 IMPLANT
GLOVE SURG UNDER POLY LF SZ6 (GLOVE) ×4 IMPLANT
GOWN STRL REUS W/ TWL LRG LVL3 (GOWN DISPOSABLE) ×8 IMPLANT
GOWN STRL REUS W/ TWL XL LVL3 (GOWN DISPOSABLE) ×4 IMPLANT
GOWN STRL REUS W/TWL LRG LVL3 (GOWN DISPOSABLE) ×27
GOWN STRL REUS W/TWL XL LVL3 (GOWN DISPOSABLE) ×6
HEMOSTAT POWDER SURGIFOAM 1G (HEMOSTASIS) ×9 IMPLANT
INSERT SUTURE HOLDER (MISCELLANEOUS) ×3 IMPLANT
KIT BASIN OR (CUSTOM PROCEDURE TRAY) ×3 IMPLANT
KIT SUCTION CATH 14FR (SUCTIONS) ×3 IMPLANT
KIT TURNOVER KIT B (KITS) ×3 IMPLANT
KIT VASOVIEW HEMOPRO 2 VH 4000 (KITS) ×3 IMPLANT
LEAD PACING MYOCARDI (MISCELLANEOUS) ×2 IMPLANT
LINE VENT (MISCELLANEOUS) ×1 IMPLANT
MARKER GRAFT CORONARY BYPASS (MISCELLANEOUS) ×9 IMPLANT
NS IRRIG 1000ML POUR BTL (IV SOLUTION) ×14 IMPLANT
PACK ACCESSORY CANNULA KIT (KITS) ×3 IMPLANT
PACK E OPEN HEART (SUTURE) ×3 IMPLANT
PACK OPEN HEART (CUSTOM PROCEDURE TRAY) ×3 IMPLANT
PAD ARMBOARD 7.5X6 YLW CONV (MISCELLANEOUS) ×6 IMPLANT
PAD ELECT DEFIB RADIOL ZOLL (MISCELLANEOUS) ×3 IMPLANT
PENCIL BUTTON HOLSTER BLD 10FT (ELECTRODE) ×3 IMPLANT
POSITIONER HEAD DONUT 9IN (MISCELLANEOUS) ×3 IMPLANT
PUMP SARN DELFIN (MISCELLANEOUS) ×1 IMPLANT
PUNCH AORTIC ROTATE 4.0MM (MISCELLANEOUS) ×3 IMPLANT
SET CARDIOPLEGIA MPS 5001102 (MISCELLANEOUS) ×1 IMPLANT
SUPPORT HEART JANKE-BARRON (MISCELLANEOUS) ×3 IMPLANT
SUT BONE WAX W31G (SUTURE) ×3 IMPLANT
SUT ETHIBOND X763 2 0 SH 1 (SUTURE) ×6 IMPLANT
SUT MNCRL AB 3-0 PS2 18 (SUTURE) ×6 IMPLANT
SUT MNCRL AB 4-0 PS2 18 (SUTURE) ×2 IMPLANT
SUT PDS AB 1 CTX 36 (SUTURE) ×6 IMPLANT
SUT PROLENE 4 0 RB 1 (SUTURE) ×6
SUT PROLENE 4 0 SH DA (SUTURE) ×3 IMPLANT
SUT PROLENE 4-0 RB1 .5 CRCL 36 (SUTURE) IMPLANT
SUT PROLENE 5 0 C 1 36 (SUTURE) ×10 IMPLANT
SUT PROLENE 7 0 BV 1 (SUTURE) ×3 IMPLANT
SUT PROLENE 7 0 BV1 MDA (SUTURE) ×3 IMPLANT
SUT STEEL 6MS V (SUTURE) ×6 IMPLANT
SUT VIC AB 1 CTX 36 (SUTURE) ×3
SUT VIC AB 1 CTX36XBRD ANBCTR (SUTURE) IMPLANT
SUT VIC AB 2-0 CT1 27 (SUTURE) ×6
SUT VIC AB 2-0 CT1 TAPERPNT 27 (SUTURE) IMPLANT
SYSTEM SAHARA CHEST DRAIN ATS (WOUND CARE) ×3 IMPLANT
TAPE PAPER 2X10 WHT MICROPORE (GAUZE/BANDAGES/DRESSINGS) ×1 IMPLANT
TAPE PAPER 3X10 WHT MICROPORE (GAUZE/BANDAGES/DRESSINGS) ×1 IMPLANT
TOWEL GREEN STERILE (TOWEL DISPOSABLE) ×3 IMPLANT
TOWEL GREEN STERILE FF (TOWEL DISPOSABLE) ×3 IMPLANT
TRAY FOLEY SLVR 16FR TEMP STAT (SET/KITS/TRAYS/PACK) ×2 IMPLANT
TUBING LAP HI FLOW INSUFFLATIO (TUBING) ×3 IMPLANT
UNDERPAD 30X36 HEAVY ABSORB (UNDERPADS AND DIAPERS) ×3 IMPLANT
WATER STERILE IRR 1000ML POUR (IV SOLUTION) ×6 IMPLANT

## 2020-08-09 NOTE — Op Note (Signed)
[address]       [place] [address]             [phone]                                          08/09/2020 Patient:  Lonnie Skinner Pre-Op Dx:  Coronary artery disease   CKD   HTN        Post-op Dx:  same Procedure: CABG X 4.  LIMA LAD, RSVG PDA, OM1, OM2   Endoscopic greater saphenous vein harvest on the right and left   Surgeon and Role:      * Lightfoot, Lucile Crater, MD - Primary    * T. Harriet Pho, PA-C - assisting  Anesthesia  general EBL:  560ml Blood Administration: none Xclamp Time:  67 min Pump Time:  161min  Drains: 28 F blake drain: L, mediastinal  Wires: none Counts: correct   Indications:  60 year old gentleman admitted with an NSTEMI.  Left heart cath revealed severe three-vessel disease.  Patient has good targets for surgical bypass.  Left heart cath.  RV and LV function.  There is no significant valvular disease. OR today for CABG 4 Findings: Bicuspid aortic valve on TEE.  No valve dysfunction.  Small LIMA, small vein.  Calcified LAD.  Good PDA.  Intramyocardial OM2.  Good OM1.  Good flows on all vein grafts.    Operative Technique: All invasive lines were placed in pre-op holding.  After the risks, benefits and alternatives were thoroughly discussed, the patient was brought to the operative theatre.  Anesthesia was induced, and the patient was prepped and draped in normal sterile fashion.  An appropriate surgical pause was performed, and pre-operative antibiotics were dosed accordingly.  We began with simultaneous incisions along the right leg for harvesting of the greater saphenous vein and the chest for the sternotomy.  In regards to the sternotomy, this was carried down with bovie cautery, and the sternum was divided with a reciprocating saw.  Meticulous hemostasis was obtained.  The left internal thoracic artery was exposed and harvested in in pedicled fashion.  The patient was systemically heparinized, and the artery was divided  distally, and placed in a papaverine sponge.    The sternal elevator was removed, and a retractor was placed.  The pericardium was divided in the midline and fashioned into a cradle with pericardial stitches.   After we confirmed an appropriate ACT, the ascending aorta was cannulated in standard fashion.  The right atrial appendage was used for venous cannulation site.  Cardiopulmonary bypass was initiated, and the heart retractor was placed. The cross clamp was applied, and a dose of anterograde cardioplegia was given with good arrest of the heart.  We moved to the posterior wall of the heart, and found a good target on the PDA.  An arteriotomy was made, and the vein graft was anastomosed to it in an end to side fashion.  Next we exposed the lateral wall, and found a good target on the OM2.  An end to side anastomosis with the vein graft was then created.  Next, we exposed the anterior lateral wall of the heart and identified a good target on OM1.   An arteriotomy was created.  The vein was anastomosed in an end to side fashion.  Finally, we exposed a good target on the LAD,  and fashioned an end to side anastomosis between it and the LITA.  We began to re-warm, and a re-animation dose of cardioplegia was given.  The heart was de-aired, and the cross clamp was removed.  Meticulous hemostasis was obtained.    A partial occludding clamp was then placed on the ascending aorta, and we created an end to side anastomosis between it and the proximal vein grafts.  The proximal sites were marked with rings.  Hemostasis was obtained, and we separated from cardiopulmonary bypass without event.the heparin was reversed with protamine.  Chest tubes and wires were placed, and the sternum was re-approximated with with sternal wires.  The soft tissue and skin were re-approximated wth absorbable suture.    The patient tolerated the procedure without any immediate complications, and was transferred to the ICU in guarded  condition.  Harrell Bary Leriche

## 2020-08-09 NOTE — Anesthesia Procedure Notes (Signed)
Arterial Line Insertion Start/End3/22/2022 7:10 AM, 08/09/2020 7:15 AM Performed by: Roderic Palau, MD, Betha Loa, CRNA, CRNA  Patient location: Pre-op. Preanesthetic checklist: patient identified, IV checked, site marked, risks and benefits discussed, surgical consent, monitors and equipment checked, pre-op evaluation, timeout performed and anesthesia consent Lidocaine 1% used for infiltration and patient sedated Right, radial was placed Catheter size: 20 G Hand hygiene performed  and maximum sterile barriers used   Attempts: 1 Procedure performed without using ultrasound guided technique. Following insertion, Biopatch and dressing applied. Post procedure assessment: normal  Patient tolerated the procedure well with no immediate complications.

## 2020-08-09 NOTE — Transfer of Care (Signed)
Immediate Anesthesia Transfer of Care Note  Patient: Lonnie Skinner  Procedure(s) Performed: CORONARY ARTERY BYPASS GRAFTING (CABG) X  FOUR   USING  LEFT INTERNAL MAMMARY ARTERY HARVEST AND RIGHT AND LEFT ENDOSCOPIC SAPHENOUS VEIN HARVEST (N/A Chest) TRANSESOPHAGEAL ECHOCARDIOGRAM (TEE) (N/A )  Patient Location: ICU  Anesthesia Type:General  Level of Consciousness: sedated  Airway & Oxygen Therapy: Patient remains intubated per anesthesia plan and Patient placed on Ventilator (see vital sign flow sheet for setting)  Post-op Assessment: Report given to RN and Post -op Vital signs reviewed and stable  Post vital signs: Reviewed and stable  Last Vitals:  Vitals Value Taken Time  BP    Temp    Pulse 93 08/09/20 1348  Resp 17 08/09/20 1348  SpO2 100 % 08/09/20 1348  Vitals shown include unvalidated device data.  Last Pain:  Vitals:   08/09/20 0436  TempSrc: Oral  PainSc:       Patients Stated Pain Goal: 0 (20/80/22 3361)  Complications: No complications documented.

## 2020-08-09 NOTE — Anesthesia Procedure Notes (Signed)
Procedure Name: Intubation Date/Time: 08/09/2020 7:54 AM Performed by: Colin Benton, CRNA Pre-anesthesia Checklist: Patient identified, Emergency Drugs available, Suction available and Patient being monitored Patient Re-evaluated:Patient Re-evaluated prior to induction Oxygen Delivery Method: Circle system utilized Preoxygenation: Pre-oxygenation with 100% oxygen Induction Type: IV induction Ventilation: Mask ventilation without difficulty Laryngoscope Size: Miller and 1 Grade View: Grade II Tube type: Oral Tube size: 8.0 mm Number of attempts: 1 Airway Equipment and Method: Stylet and Oral airway Placement Confirmation: ETT inserted through vocal cords under direct vision,  positive ETCO2 and breath sounds checked- equal and bilateral Tube secured with: Tape Dental Injury: Teeth and Oropharynx as per pre-operative assessment  Comments: Performed by Tandy Gaw srna

## 2020-08-09 NOTE — Brief Op Note (Signed)
08/02/2020 - 08/09/2020  1:35 PM  PATIENT:  Lonnie Skinner  60 y.o. male  PRE-OPERATIVE DIAGNOSIS:  CORONARY ARTERY DISEASE  POST-OPERATIVE DIAGNOSIS:  CORONARY ARTERY DISEASE  PROCEDURE:  Procedure(s): CORONARY ARTERY BYPASS GRAFTING (CABG) X  FOUR   USING  LEFT INTERNAL MAMMARY ARTERY HARVEST AND RIGHT AND LEFT ENDOSCOPIC SAPHENOUS VEIN HARVEST (N/A) TRANSESOPHAGEAL ECHOCARDIOGRAM (TEE) (N/A)   LIMA to LAD SVG to PDA SVG to OM1 SVG to OM2  R SV harvest: 35 minutes, prep 10 minutes L SV harvest: 25 minutes, prep 5 minutes  SURGEON:  Surgeon(s) and Role:    * Lightfoot, Lucile Crater, MD - Primary  PHYSICIAN ASSISTANT:  Nicholes Rough, PA-C   ANESTHESIA:   general  EBL:  350 mL   BLOOD ADMINISTERED:none  DRAINS:  routine    LOCAL MEDICATIONS USED:  NONE  SPECIMEN:  No Specimen  DISPOSITION OF SPECIMEN:  N/A  COUNTS:  YES  DICTATION: .Dragon Dictation  PLAN OF CARE: Admit to inpatient   PATIENT DISPOSITION:  ICU - intubated and hemodynamically stable.   Delay start of Pharmacological VTE agent (>24hrs) due to surgical blood loss or risk of bleeding: yes

## 2020-08-09 NOTE — Progress Notes (Signed)
  Echocardiogram Echocardiogram Transesophageal has been performed.  Darlina Sicilian M 08/09/2020, 9:03 AM

## 2020-08-09 NOTE — Anesthesia Postprocedure Evaluation (Signed)
Anesthesia Post Note  Patient: [name]  Procedure(s) Performed: CORONARY ARTERY BYPASS GRAFTING (CABG) X  FOUR   USING  LEFT INTERNAL MAMMARY ARTERY HARVEST AND RIGHT AND LEFT ENDOSCOPIC SAPHENOUS VEIN HARVEST (N/A Chest) TRANSESOPHAGEAL ECHOCARDIOGRAM (TEE) (N/A )     Patient location during evaluation: SICU Anesthesia Type: General Level of consciousness: sedated Pain management: pain level controlled Vital Signs Assessment: post-procedure vital signs reviewed and stable Respiratory status: patient remains intubated per anesthesia plan Cardiovascular status: stable Postop Assessment: no apparent nausea or vomiting Anesthetic complications: no   No complications documented.  Last Vitals:  Vitals:   08/09/20 1445 08/09/20 1500  BP:  130/89  Pulse: 88 94  Resp: 12 15  Temp: (!) 36.1 C (!) 36.1 C  SpO2: 100% 100%    Last Pain:  Vitals:   08/09/20 1445  TempSrc:   PainSc: 0-No pain                 Kayanna Mckillop,W. EDMOND

## 2020-08-09 NOTE — Procedures (Signed)
Extubation Procedure Note  Patient Details:   Name: Lonnie Skinner DOB: 01/09/1961 MRN: [id number]   Airway Documentation:    Vent end date: 08/09/20 Vent end time: 1710   Evaluation  O2 sats: stable throughout Complications: No apparent complications Patient did tolerate procedure well. Bilateral Breath Sounds: Clear,Diminished   Yes   NIF>-20, VC> 45mL/kg  No stridor noted  Sury Wentworth 08/09/2020, 5:16 PM

## 2020-08-09 NOTE — Hospital Course (Addendum)
      [address]       [place] [address]             [phone]       History of Present Illness:    at time of consult  The patient is a 60 year old male with a significant history of cardiac disease including non-STEMI with placement of drug-eluting stent to the mid RCA in 2015.  At that time he had residual moderate LAD disease.  He was last seen by cardiology in February 2021 at which time he was doing well on current medical regimen.  He was recently evaluated in the ER on 07/27/2020 after an episode of chest pressure while walking his dog that was associated with shortness of breath. These symptoms have been recurring in retrospect for about a month.   Symptoms were resolved with rest.  He was seen by cardiology and subsequently cardiac catheterization was scheduled due to high risk of progression of previous coronary artery disease.  Catheterization was scheduled on today's date.  He was found to have severe three-vessel coronary artery disease including critical 95% proximal LAD lesion, 70% obtuse marginal #1 lesion, 60% obtuse marginal #2 as well as 70% mid RCA lesion proximal to previous drug-eluting stent which was found to have abnormal RFR.  LV function was noted to be normal.  He did have mildly elevated LVEDP.  Due to these findings cardiothoracic surgical consultation is requested for consideration of coronary artery surgical revascularization.  Plans are also noted to check echocardiogram and patient will require a Plavix washout.  The patient is a former smoker who quit in 2007.  He does have a history of previous DVT in 1981.  He has stage II chronic kidney disease with creatinine noted on 07/27/2020 to be 1.14 prior to catheterization.  Hospital Course: The patient was medically stabilized with a heparin drip and cardiology management as he had a Plavix washout.  Prior to proceeding with surgical intervention.  Mr. Rockhill underwent a CABG x 4 with Dr. Kipp Brood on  08/09/2020. He tolerated the procedure well and was transferred to the surgical ICU in stable condition.   Postoperative hospital course:  Patient is doing well.  He was weaned from the ventilator, neurologically intact , using standard protocols.  Initially he did require some Neo-Synephrine but this was discontinued over time as hemodynamics were stabilized.  He is also started on a beta-blocker, aspirin and statin.  Chest tubes were removed on postop day #2.  Renal function has remained in the normal range and he has good urine output.  He is maintaining normal sinus rhythm with some very brief episodes of atrial fibrillation .  He does not have baseline anxiety and is started on Xanax.  He does have an expected acute blood loss anemia and we are trending hemoglobin hematocrit.  He currently is not in the transfusion threshold range.

## 2020-08-09 NOTE — Progress Notes (Signed)
Patient repeatedly refused to allow RT attempt for ABG.  RN present.

## 2020-08-09 NOTE — Plan of Care (Signed)

## 2020-08-09 NOTE — Progress Notes (Signed)
     [address]       [place] [address]             [phone]       No events  Vitals:   08/08/20 2219 08/09/20 0436  BP: 111/85   Pulse: 70 78  Resp: 18 19  Temp: 98.3 F (36.8 C) 97.9 F (36.6 C)  SpO2: 93% 94%   Alert NAD Sinus EWOB  60 yo male with CAD - OR for CABG [place]

## 2020-08-09 NOTE — Anesthesia Procedure Notes (Signed)
Central Venous Catheter Insertion Performed by: Roderic Palau, MD, anesthesiologist Start/End3/22/2022 6:50 AM, 08/09/2020 7:05 AM Patient location: Pre-op. Preanesthetic checklist: patient identified, IV checked, site marked, risks and benefits discussed, surgical consent, monitors and equipment checked, pre-op evaluation, timeout performed and anesthesia consent Position: Trendelenburg Lidocaine 1% used for infiltration and patient sedated Hand hygiene performed , maximum sterile barriers used  and Seldinger technique used Catheter size: 8.5 Fr Total catheter length 10. Central line was placed.Sheath introducer Procedure performed using ultrasound guided technique. Ultrasound Notes:anatomy identified, needle tip was noted to be adjacent to the nerve/plexus identified, no ultrasound evidence of intravascular and/or intraneural injection and image(s) printed for medical record Attempts: 1 Following insertion, line sutured, dressing applied and Biopatch. Post procedure assessment: blood return through all ports, free fluid flow and no air  Patient tolerated the procedure well with no immediate complications. Additional procedure comments: 3 lumen slick catherter inserted through the side port of the introducer.Marland Kitchen

## 2020-08-09 NOTE — Progress Notes (Signed)
Patient refused Restoril after I had opened pill packaged. Wasted pill in Chief Operating Officer, witnessed by Tobias Alexander.

## 2020-08-10 ENCOUNTER — Encounter (HOSPITAL_COMMUNITY): Payer: Self-pay | Admitting: Thoracic Surgery (Cardiothoracic Vascular Surgery)

## 2020-08-10 ENCOUNTER — Inpatient Hospital Stay (HOSPITAL_COMMUNITY): Payer: Medicare Other

## 2020-08-10 LAB — CBC
HCT: 33.2 % — ABNORMAL LOW (ref 39.0–52.0)
HCT: 28.8 % — ABNORMAL LOW (ref 39.0–52.0)
Hemoglobin: 11.3 g/dL — ABNORMAL LOW (ref 13.0–17.0)
Hemoglobin: 9.9 g/dL — ABNORMAL LOW (ref 13.0–17.0)
MCH: 31.5 pg (ref 26.0–34.0)
MCH: 31.9 pg (ref 26.0–34.0)
MCHC: 34 g/dL (ref 30.0–36.0)
MCHC: 34.4 g/dL (ref 30.0–36.0)
MCV: 92.5 fL (ref 80.0–100.0)
MCV: 92.9 fL (ref 80.0–100.0)
Platelets: 233 10*3/uL (ref 150–400)
Platelets: 141 10*3/uL — ABNORMAL LOW (ref 150–400)
RBC: 3.59 MIL/uL — ABNORMAL LOW (ref 4.22–5.81)
RBC: 3.1 MIL/uL — ABNORMAL LOW (ref 4.22–5.81)
RDW: 13.7 % (ref 11.5–15.5)
RDW: 14.2 % (ref 11.5–15.5)
WBC: 12.9 10*3/uL — ABNORMAL HIGH (ref 4.0–10.5)
WBC: 9.8 10*3/uL (ref 4.0–10.5)
nRBC: 0 % (ref 0.0–0.2)
nRBC: 0 % (ref 0.0–0.2)

## 2020-08-10 LAB — MAGNESIUM
Magnesium: 2.7 mg/dL — ABNORMAL HIGH (ref 1.7–2.4)
Magnesium: 2.4 mg/dL (ref 1.7–2.4)

## 2020-08-10 LAB — GLUCOSE, CAPILLARY
Glucose-Capillary: 123 mg/dL — ABNORMAL HIGH (ref 70–99)
Glucose-Capillary: 134 mg/dL — ABNORMAL HIGH (ref 70–99)
Glucose-Capillary: 137 mg/dL — ABNORMAL HIGH (ref 70–99)
Glucose-Capillary: 155 mg/dL — ABNORMAL HIGH (ref 70–99)
Glucose-Capillary: 167 mg/dL — ABNORMAL HIGH (ref 70–99)
Glucose-Capillary: 93 mg/dL (ref 70–99)

## 2020-08-10 LAB — BASIC METABOLIC PANEL
Anion gap: 5 (ref 5–15)
Anion gap: 6 (ref 5–15)
BUN: 19 mg/dL (ref 6–20)
BUN: 17 mg/dL (ref 6–20)
CO2: 23 mmol/L (ref 22–32)
CO2: 24 mmol/L (ref 22–32)
Calcium: 8.2 mg/dL — ABNORMAL LOW (ref 8.9–10.3)
Calcium: 8.5 mg/dL — ABNORMAL LOW (ref 8.9–10.3)
Chloride: 103 mmol/L (ref 98–111)
Chloride: 102 mmol/L (ref 98–111)
Creatinine, Ser: 1.05 mg/dL (ref 0.61–1.24)
Creatinine, Ser: 1 mg/dL (ref 0.61–1.24)
GFR, Estimated: >60 mL/min (ref >60)
GFR, Estimated: >60 mL/min (ref >60)
Glucose, Bld: 124 mg/dL — ABNORMAL HIGH (ref 70–99)
Glucose, Bld: 176 mg/dL — ABNORMAL HIGH (ref 70–99)
Potassium: 4.1 mmol/L (ref 3.5–5.1)
Potassium: 4 mmol/L (ref 3.5–5.1)
Sodium: 131 mmol/L — ABNORMAL LOW (ref 135–145)
Sodium: 132 mmol/L — ABNORMAL LOW (ref 135–145)

## 2020-08-10 MED ORDER — INSULIN ASPART 100 UNIT/ML ~~LOC~~ SOLN: 0.0000 [IU] | SUBCUTANEOUS | Status: DC

## 2020-08-10 MED ORDER — ENOXAPARIN SODIUM 40 MG/0.4ML ~~LOC~~ SOLN
40.0000 mg | Freq: Every day | SUBCUTANEOUS | Status: DC
  Administered 2020-08-10 – 2020-08-12 (×3): 40 mg via SUBCUTANEOUS
  Filled 2020-08-10 (×3): qty 0.4

## 2020-08-10 MED FILL — Heparin Sodium (Porcine) Inj 1000 Unit/ML: INTRAMUSCULAR | Qty: 30 | Status: AC

## 2020-08-10 MED FILL — Calcium Chloride Inj 10%: INTRAVENOUS | Qty: 10 | Status: AC

## 2020-08-10 MED FILL — Potassium Chloride Inj 2 mEq/ML: INTRAVENOUS | Qty: 40 | Status: AC

## 2020-08-10 MED FILL — Sodium Bicarbonate IV Soln 8.4%: INTRAVENOUS | Qty: 50 | Status: AC

## 2020-08-10 MED FILL — Heparin Sodium (Porcine) Inj 1000 Unit/ML: INTRAMUSCULAR | Qty: 120 | Status: AC

## 2020-08-10 MED FILL — Electrolyte-R (PH 7.4) Solution: INTRAVENOUS | Qty: 2000 | Status: AC

## 2020-08-10 MED FILL — Mannitol IV Soln 20%: INTRAVENOUS | Qty: 500 | Status: AC

## 2020-08-10 MED FILL — Heparin Sodium (Porcine) Inj 1000 Unit/ML: INTRAMUSCULAR | Qty: 10 | Status: AC

## 2020-08-10 MED FILL — Lidocaine HCl Local Preservative Free (PF) Inj 2%: INTRAMUSCULAR | Qty: 15 | Status: AC

## 2020-08-10 NOTE — Progress Notes (Signed)
[facility] BRIEF SICU PROGRESS NOTE  1 Day Post-Op  S/P Procedure(s) (LRB): CORONARY ARTERY BYPASS GRAFTING (CABG) X  FOUR   USING  LEFT INTERNAL MAMMARY ARTERY HARVEST AND RIGHT AND LEFT ENDOSCOPIC SAPHENOUS VEIN HARVEST (N/A) TRANSESOPHAGEAL ECHOCARDIOGRAM (TEE) (N/A)   NSR w/ stable hemodynamics Breathing comfortably on room air UOP adequate  Plan: Continue current plan  Rexene Alberts, MD 08/10/2020 4:44 PM

## 2020-08-10 NOTE — Progress Notes (Signed)
      [address]       [place] [address]             [phone]                 1 Day Post-Op Procedure(s) (LRB): CORONARY ARTERY BYPASS GRAFTING (CABG) X  FOUR   USING  LEFT INTERNAL MAMMARY ARTERY HARVEST AND RIGHT AND LEFT ENDOSCOPIC SAPHENOUS VEIN HARVEST (N/A) TRANSESOPHAGEAL ECHOCARDIOGRAM (TEE) (N/A)   Events: No events _______________________________________________________________ Vitals: BP 128/70 (BP Location: Left Arm)   Pulse 95   Temp 97.7 F (36.5 C) (Oral)   Resp (!) 30   Ht 5\' 8"  (1.727 m)   Wt 88.6 kg   SpO2 97%   BMI 29.71 kg/m   - Neuro: alert NAD  - Cardiovascular: sinus  Drips: neo off CVP:  [1 mmHg-44 mmHg] 10 mmHg  - Pulm: EWOB Vent Mode: Stand-by  ABG    Component Value Date/Time   PHART 7.332 (L) 08/09/2020 1808   PCO2ART 46.8 08/09/2020 1808   PO2ART 141 (H) 08/09/2020 1808   HCO3 24.8 08/09/2020 1808   TCO2 26 08/09/2020 1808   ACIDBASEDEF 1.0 08/09/2020 1808   O2SAT 99.0 08/09/2020 1808    - Abd: ND - Extremity: warm  .Intake/Output      03/22 0701 03/23 0700 03/23 0701 03/24 0700   P.O. 100 180   I.V. (mL/kg) 4096.9 (46.2) 361.5 (4.1)   Blood 210    IV Piggyback 1550 150   Total Intake(mL/kg) 5956.9 (67.2) 691.5 (7.8)   Urine (mL/kg/hr) 3305 (1.6) 385 (0.5)   Emesis/NG output 75    Blood 350    Chest Tube 240 170   Total Output 3970 555   Net +1986.9 +136.5           _______________________________________________________________ Labs: CBC Latest Ref Rng & Units 08/10/2020 08/09/2020 08/09/2020  WBC 4.0 - 10.5 K/uL 12.9(H) 14.8(H) -  Hemoglobin 13.0 - 17.0 g/dL 11.3(L) 12.5(L) 11.9(L)  Hematocrit 39.0 - 52.0 % 33.2(L) 35.3(L) 35.0(L)  Platelets 150 - 400 K/uL 233 188 -   CMP Latest Ref Rng & Units 08/10/2020 08/09/2020 08/09/2020  Glucose 70 - 99 mg/dL 124(H) 155(H) -  BUN 6 - 20 mg/dL 19 21(H) -  Creatinine 0.61 - 1.24 mg/dL 1.05 1.08 -  Sodium 135 - 145 mmol/L 131(L) 136 138  Potassium  3.5 - 5.1 mmol/L 4.1 4.6 4.4  Chloride 98 - 111 mmol/L 103 106 -  CO2 22 - 32 mmol/L 23 24 -  Calcium 8.9 - 10.3 mg/dL 8.2(L) 8.7(L) -  Total Protein 6.5 - 8.1 g/dL - - -  Total Bilirubin 0.3 - 1.2 mg/dL - - -  Alkaline Phos 38 - 126 U/L - - -  AST 15 - 41 U/L - - -  ALT 0 - 44 U/L - - -    CXR: clear  _______________________________________________________________  Assessment and Plan: POD 1 s/p CABG  Neuro: pain controlled.  On anxiety meds CV: off neo.  Will start BB.  On asp/statin Pulm: pulm toilet.  Will remove CT tomorrow Renal: creat stable.  Good uop GI: advancing diet Heme: stable ID: [email or web address] Endo: SSI Dispo: continue ICU care   [name] 08/10/2020 4:37 PM

## 2020-08-10 NOTE — Addendum Note (Signed)
Addendum  created 08/10/20 0758 by Wilburn Cornelia, CRNA   Order list changed

## 2020-08-10 NOTE — Progress Notes (Addendum)
[facility] DAILY ICU PROGRESS NOTE                   [address]            [place] [address]          [phone]   1 Day Post-Op Procedure(s) (LRB): CORONARY ARTERY BYPASS GRAFTING (CABG) X  FOUR   USING  LEFT INTERNAL MAMMARY ARTERY HARVEST AND RIGHT AND LEFT ENDOSCOPIC SAPHENOUS VEIN HARVEST (N/A) TRANSESOPHAGEAL ECHOCARDIOGRAM (TEE) (N/A)  Total Length of Stay:  LOS: 8 days   Subjective: Awake and alert.  He has walked a lap in the unit this afternoon.  Neo-Synephrine has been off for a couple of hours according to RN, vital signs remained stable.   Objective: Vital signs in last 24 hours: Temp:  [97.52 F (36.4 C)-99.9 F (37.7 C)] 97.7 F (36.5 C) (03/23 1600) Pulse Rate:  [76-99] 95 (03/23 1600) Cardiac Rhythm: Normal sinus rhythm (03/23 1600) Resp:  [10-36] 30 (03/23 1600) BP: (79-128)/(52-84) 128/70 (03/23 1600) SpO2:  [91 %-100 %] 97 % (03/23 1600) Arterial Line BP: (65-127)/(30-115) 127/84 (03/23 1600) Weight:  [88.6 kg] 88.6 kg (03/23 0500)  Filed Weights   08/08/20 0654 08/09/20 0538 08/10/20 0500  Weight: 86 kg 84.2 kg 88.6 kg    Weight change: 4.4 kg   Hemodynamic parameters for last 24 hours: CVP:  [1 mmHg-44 mmHg] 10 mmHg  Intake/Output from previous day: 03/22 0701 - 03/23 0700 In: 5956.9 [P.O.:100; I.V.:4096.9; Blood:210; IV Piggyback:1550] Out: 4008 [Urine:3305; Emesis/NG output:75; Blood:350; Chest Tube:240]  Intake/Output this shift: Total I/O In: 691.5 [P.O.:180; I.V.:361.5; IV Piggyback:150] Out: 555 [Urine:385; Chest Tube:170]  Current Meds: Scheduled Meds: . acetaminophen  1,000 mg Oral Q6H   Or  . acetaminophen (TYLENOL) oral liquid 160 mg/5 mL  1,000 mg Per Tube Q6H  . aspirin EC  325 mg Oral Daily   Or  . aspirin  324 mg Per Tube Daily  . atorvastatin  80 mg Oral QPM  . bisacodyl  10 mg Oral Daily   Or  . bisacodyl  10 mg Rectal Daily  . Chlorhexidine Gluconate Cloth  6 each Topical Daily  . docusate sodium   200 mg Oral Daily  . enoxaparin (LOVENOX) injection  40 mg Subcutaneous QHS  . insulin aspart  0-24 Units Subcutaneous Q4H  . insulin aspart  0-24 Units Subcutaneous Q4H  . mouth rinse  15 mL Mouth Rinse BID  . metoprolol tartrate  12.5 mg Oral BID   Or  . metoprolol tartrate  12.5 mg Per Tube BID  . [START ON 08/11/2020] pantoprazole  40 mg Oral Daily  . sodium chloride flush  10-40 mL Intracatheter Q12H  . sodium chloride flush  3 mL Intravenous Q12H   Continuous Infusions: . sodium chloride Stopped (08/10/20 1138)  . sodium chloride    . dexmedetomidine (PRECEDEX) IV infusion Stopped (08/09/20 1543)  . DOBUTamine    . lactated ringers    . lactated ringers Stopped (08/10/20 1316)  . niCARDipine    . nitroGLYCERIN    . norepinephrine (LEVOPHED) Adult infusion    . phenylephrine (NEO-SYNEPHRINE) Adult infusion Stopped (08/10/20 1439)   PRN Meds:.sodium chloride, ALPRAZolam, metoprolol tartrate, midazolam, morphine injection, ondansetron (ZOFRAN) IV, oxyCODONE, sodium chloride flush, sodium chloride flush, traMADol  General appearance: alert, appears stated age and no distress Neurologic: Mental status appropriate, no gross focal deficits. Heart: Currently normal sinus rhythm with heart rate of 85.  He had a  brief, 6-minute episode of rate-controlled atrial fibrillation last hour. Lungs: Breath sounds are shallow but clear.  Chest tube drainage is tapering off Abdomen: Soft and nontender, active bowel sounds. Extremities: Minimal peripheral edema.  All extremities are well perfused.  The right lower extremity EVH incision is dry and intact the lower legs are wrapped with Kerlix. Wound: The sternotomy incision is covered with a clean dry dressing.  Lab Results: CBC: Recent Labs    08/09/20 1931 08/10/20 0303  WBC 14.8* 12.9*  HGB 12.5* 11.3*  HCT 35.3* 33.2*  PLT 188 233   BMET:  Recent Labs    08/09/20 1931 08/10/20 0303  NA 136 131*  K 4.6 4.1  CL 106 103  CO2 24  23  GLUCOSE 155* 124*  BUN 21* 19  CREATININE 1.08 1.05  CALCIUM 8.7* 8.2*    CMET: Lab Results  Component Value Date   WBC 12.9 (H) 08/10/2020   HGB 11.3 (L) 08/10/2020   HCT 33.2 (L) 08/10/2020   PLT 233 08/10/2020   GLUCOSE 124 (H) 08/10/2020   CHOL 102 03/09/2020   TRIG 90 03/09/2020   HDL 39 (L) 03/09/2020   LDLCALC 45 03/09/2020   ALT 41 06/16/2020   AST 43 (H) 06/16/2020   NA 131 (L) 08/10/2020   K 4.1 08/10/2020   CL 103 08/10/2020   CREATININE 1.05 08/10/2020   BUN 19 08/10/2020   CO2 23 08/10/2020   TSH 2.16 09/15/2014   INR 1.6 (H) 08/09/2020   HGBA1C 5.4 08/09/2020      PT/INR:  Recent Labs    08/09/20 1410  LABPROT 18.6*  INR 1.6*   Radiology: [facility] Chest Port 1 View  Result Date: 08/10/2020 CLINICAL DATA:  Chest tube. EXAM: PORTABLE CHEST 1 VIEW COMPARISON:  08/09/2020. FINDINGS: Interim extubation and removal of NG tube. Right IJ line, mediastinal drainage catheter, left chest tube in stable position. Persistent left base atelectasis. Tiny bilateral pleural effusions can not be excluded. No pneumothorax. IMPRESSION: 1. Interim extubation and removal of NG tube. Right IJ line, mediastinal drainage catheter, left chest tube in stable position. No pneumothorax. 2. Persistent left base atelectasis. Tiny bilateral pleural effusions cannot be excluded. Electronically Signed   By: Marcello Moores  Register   On: 08/10/2020 06:35     Assessment/Plan: S/P Procedure(s) (LRB): CORONARY ARTERY BYPASS GRAFTING (CABG) X  FOUR   USING  LEFT INTERNAL MAMMARY ARTERY HARVEST AND RIGHT AND LEFT ENDOSCOPIC SAPHENOUS VEIN HARVEST (N/A) TRANSESOPHAGEAL ECHOCARDIOGRAM (TEE) (N/A)  -Postop day 1 CABG x4 for multivessel coronary disease presenting with acute non-STEMI and normal ejection fraction.  Vital signs and hemodynamics are stable.  He is off all vasoactive drips.  Will DC the arterial line and hemodynamic monitoring.  Continue working on mobility.  Continue aspirin, atorvastatin,  and low-dose metoprolol.  -Postoperative A. Fib-very brief episode of rate control A. Fib about 1 hour ago.  Metoprolol has been started.  K+ and Mg++ on a.m. labs were within normal limits.  Monitor but will not start antiarrhythmics for now.  -Mild expected acute blood loss anemia--chest tube drainage is tapering off.  Monitor.  -History of anxiety-managed with Xanax prior to surgery, this has been resumed.  -DVT prophylaxis-continue working on mobility, to start daily enoxaparin subcu this evening.  -Chronic venous insufficiency-has evidence of venous stasis in his lower legs.  Continue topical skin care and Kerlix wraps to protect these lesions  Antony Odea, PA-C [phone] 08/10/2020 4:34 PM   Agree with above  Chinmayi Rumer Bary Leriche

## 2020-08-10 NOTE — Plan of Care (Signed)
  Problem: Education: Goal: Knowledge of General Education information will improve Description: Including pain rating scale, medication(s)/side effects and non-pharmacologic comfort measures Outcome: Progressing   Problem: Health Behavior/Discharge Planning: Goal: Ability to manage health-related needs will improve Outcome: Progressing   Problem: Clinical Measurements: Goal: Ability to maintain clinical measurements within normal limits will improve Outcome: Progressing Goal: Will remain free from infection Outcome: Progressing Goal: Diagnostic test results will improve Outcome: Progressing Goal: Respiratory complications will improve Outcome: Progressing Goal: Cardiovascular complication will be avoided Outcome: Progressing   Problem: Activity: Goal: Risk for activity intolerance will decrease Outcome: Progressing   Problem: Nutrition: Goal: Adequate nutrition will be maintained Outcome: Progressing   Problem: Elimination: Goal: Will not experience complications related to urinary retention Outcome: Progressing   Problem: Pain Managment: Goal: General experience of comfort will improve Outcome: Progressing   Problem: Safety: Goal: Ability to remain free from injury will improve Outcome: Progressing   Problem: Skin Integrity: Goal: Risk for impaired skin integrity will decrease Outcome: Progressing   Problem: Activity: Goal: Risk for activity intolerance will decrease Outcome: Progressing   Problem: Cardiac: Goal: Will achieve and/or maintain hemodynamic stability Outcome: Progressing   Problem: Clinical Measurements: Goal: Postoperative complications will be avoided or minimized Outcome: Progressing   Problem: Respiratory: Goal: Respiratory status will improve Outcome: Progressing   Problem: Skin Integrity: Goal: Wound healing without signs and symptoms of infection Outcome: Progressing Goal: Risk for impaired skin integrity will decrease Outcome:  Progressing   Problem: Coping: Goal: Level of anxiety will decrease Outcome: Not Progressing   Problem: Elimination: Goal: Will not experience complications related to bowel motility Outcome: Not Progressing

## 2020-08-11 ENCOUNTER — Telehealth: Payer: Self-pay | Admitting: Physician Assistant

## 2020-08-11 ENCOUNTER — Inpatient Hospital Stay (HOSPITAL_COMMUNITY): Payer: Medicare Other

## 2020-08-11 DIAGNOSIS — E7800 Pure hypercholesterolemia, unspecified: Secondary | ICD-10-CM

## 2020-08-11 DIAGNOSIS — E13621 Other specified diabetes mellitus with foot ulcer: Secondary | ICD-10-CM

## 2020-08-11 LAB — POCT I-STAT 7, (LYTES, BLD GAS, ICA,H+H)
Acid-base deficit: 9 mmol/L — ABNORMAL HIGH (ref 0.0–2.0)
Bicarbonate: 15.4 mmol/L — ABNORMAL LOW (ref 20.0–28.0)
Calcium, Ion: 1.06 mmol/L — ABNORMAL LOW (ref 1.15–1.40)
HCT: 28 % — ABNORMAL LOW (ref 39.0–52.0)
Hemoglobin: 9.5 g/dL — ABNORMAL LOW (ref 13.0–17.0)
O2 Saturation: 99 %
Patient temperature: 36.4
Potassium: 2.6 mmol/L — CL (ref 3.5–5.1)
Sodium: 146 mmol/L — ABNORMAL HIGH (ref 135–145)
TCO2: 16 mmol/L — ABNORMAL LOW (ref 22–32)
pCO2 arterial: 27.2 mmHg — ABNORMAL LOW (ref 32.0–48.0)
pH, Arterial: 7.358 (ref 7.350–7.450)
pO2, Arterial: 140 mmHg — ABNORMAL HIGH (ref 83.0–108.0)

## 2020-08-11 LAB — BASIC METABOLIC PANEL
Anion gap: 3 — ABNORMAL LOW (ref 5–15)
BUN: 19 mg/dL (ref 6–20)
CO2: 27 mmol/L (ref 22–32)
Calcium: 8.2 mg/dL — ABNORMAL LOW (ref 8.9–10.3)
Chloride: 104 mmol/L (ref 98–111)
Creatinine, Ser: 1.19 mg/dL (ref 0.61–1.24)
GFR, Estimated: >60 mL/min (ref >60)
Glucose, Bld: 118 mg/dL — ABNORMAL HIGH (ref 70–99)
Potassium: 3.7 mmol/L (ref 3.5–5.1)
Sodium: 134 mmol/L — ABNORMAL LOW (ref 135–145)

## 2020-08-11 LAB — GLUCOSE, CAPILLARY
Glucose-Capillary: 107 mg/dL — ABNORMAL HIGH (ref 70–99)
Glucose-Capillary: 122 mg/dL — ABNORMAL HIGH (ref 70–99)
Glucose-Capillary: 127 mg/dL — ABNORMAL HIGH (ref 70–99)
Glucose-Capillary: 71 mg/dL (ref 70–99)
Glucose-Capillary: 76 mg/dL (ref 70–99)
Glucose-Capillary: 80 mg/dL (ref 70–99)
Glucose-Capillary: 87 mg/dL (ref 70–99)

## 2020-08-11 LAB — CBC
HCT: 26.5 % — ABNORMAL LOW (ref 39.0–52.0)
Hemoglobin: 9.4 g/dL — ABNORMAL LOW (ref 13.0–17.0)
MCH: 33 pg (ref 26.0–34.0)
MCHC: 35.5 g/dL (ref 30.0–36.0)
MCV: 93 fL (ref 80.0–100.0)
Platelets: 102 10*3/uL — ABNORMAL LOW (ref 150–400)
RBC: 2.85 MIL/uL — ABNORMAL LOW (ref 4.22–5.81)
RDW: 14 % (ref 11.5–15.5)
WBC: 7.9 10*3/uL (ref 4.0–10.5)
nRBC: 0 % (ref 0.0–0.2)

## 2020-08-11 MED ORDER — SODIUM CHLORIDE 0.9% FLUSH
3.0000 mL | INTRAVENOUS | Status: DC | PRN
Start: 2020-08-11 — End: 2020-08-13

## 2020-08-11 MED ORDER — SODIUM CHLORIDE 0.9 % IV SOLN: 250.0000 mL | INTRAVENOUS | Status: DC | PRN

## 2020-08-11 MED ORDER — INSULIN ASPART 100 UNIT/ML ~~LOC~~ SOLN: 0.0000 [IU] | Freq: Three times a day (TID) | SUBCUTANEOUS | Status: DC

## 2020-08-11 MED ORDER — SODIUM CHLORIDE 0.9% FLUSH
3.0000 mL | Freq: Two times a day (BID) | INTRAVENOUS | Status: DC
  Administered 2020-08-11 – 2020-08-13 (×5): 3 mL via INTRAVENOUS

## 2020-08-11 MED ORDER — POTASSIUM CHLORIDE 20 MEQ PO PACK
20.0000 meq | PACK | ORAL | Status: AC
  Administered 2020-08-11 (×3): 20 meq
  Filled 2020-08-11 (×3): qty 1

## 2020-08-11 MED ORDER — ~~LOC~~ CARDIAC SURGERY, PATIENT & FAMILY EDUCATION: Freq: Once | Status: AC

## 2020-08-11 NOTE — Progress Notes (Signed)
Progress Note  Patient Name: Lonnie Skinner Date of Encounter: 08/11/2020  Primary Cardiologist: Ena Dawley, MD   Subjective   POD #2 from CABG. Brief post op afib, resolved. Has ambulated well, pain is present but not limiting for him.  Inpatient Medications    Scheduled Meds: . acetaminophen  1,000 mg Oral Q6H   Or  . acetaminophen (TYLENOL) oral liquid 160 mg/5 mL  1,000 mg Per Tube Q6H  . aspirin EC  325 mg Oral Daily   Or  . aspirin  324 mg Per Tube Daily  . atorvastatin  80 mg Oral QPM  . bisacodyl  10 mg Oral Daily   Or  . bisacodyl  10 mg Rectal Daily  . Chlorhexidine Gluconate Cloth  6 each Topical Daily  . docusate sodium  200 mg Oral Daily  . enoxaparin (LOVENOX) injection  40 mg Subcutaneous QHS  . insulin aspart  0-24 Units Subcutaneous Q4H  . insulin aspart  0-24 Units Subcutaneous Q4H  . mouth rinse  15 mL Mouth Rinse BID  . metoprolol tartrate  12.5 mg Oral BID   Or  . metoprolol tartrate  12.5 mg Per Tube BID  . pantoprazole  40 mg Oral Daily  . potassium chloride  20 mEq Per Tube Q4H  . sodium chloride flush  10-40 mL Intracatheter Q12H  . sodium chloride flush  3 mL Intravenous Q12H  . sodium chloride flush  3 mL Intravenous Q12H   Continuous Infusions: . sodium chloride Stopped (08/10/20 1138)  . sodium chloride    . sodium chloride    . dexmedetomidine (PRECEDEX) IV infusion Stopped (08/09/20 1543)  . DOBUTamine    . lactated ringers    . lactated ringers Stopped (08/10/20 1316)  . niCARDipine    . nitroGLYCERIN    . norepinephrine (LEVOPHED) Adult infusion    . phenylephrine (NEO-SYNEPHRINE) Adult infusion Stopped (08/10/20 1439)   PRN Meds: sodium chloride, sodium chloride, ALPRAZolam, metoprolol tartrate, midazolam, morphine injection, ondansetron (ZOFRAN) IV, oxyCODONE, sodium chloride flush, sodium chloride flush, sodium chloride flush, traMADol   Vital Signs    Vitals:   08/11/20 0700 08/11/20 0800 08/11/20 0807 08/11/20  0900  BP: 102/77 104/71  125/82  Pulse: 78 78  81  Resp: 16 (!) 21  (!) 22  Temp:   98.1 F (36.7 C)   TempSrc:   Oral   SpO2: 100% 99%  100%  Weight:      Height:        Intake/Output Summary (Last 24 hours) at 08/11/2020 1027 Last data filed at 08/11/2020 0903 Gross per 24 hour  Intake 882.52 ml  Output 2025 ml  Net -1142.48 ml   Filed Weights   08/09/20 0538 08/10/20 0500 08/11/20 0500  Weight: 84.2 kg 88.6 kg 90.4 kg    Physical Exam   GEN: Well nourished, well developed in no acute distress NECK: No JVD CARDIAC: regular rhythm, normal S1 and S2, no rubs or gallops. No murmur. VASCULAR: Radial pulses 2+ bilaterally.  RESPIRATORY:  Clear to auscultation in upper fields, slightly coarse at bases ABDOMEN: Soft, non-tender, non-distended MUSCULOSKELETAL:  Moves all 4 limbs independently SKIN: Warm and dry. 1+ LE edema bilaterally NEUROLOGIC:  No focal neuro deficits noted. PSYCHIATRIC:  Normal affect   Labs    Chemistry Recent Labs  Lab 08/10/20 0303 08/10/20 1657 08/11/20 0355  NA 131* 132* 134*  K 4.1 4.0 3.7  CL 103 102 104  CO2 23 24 27  GLUCOSE 124* 176* 118*  BUN 19 17 19   CREATININE 1.05 1.00 1.19  CALCIUM 8.2* 8.5* 8.2*  GFRNONAA >60 >60 >60  ANIONGAP 5 6 3*     Hematology Recent Labs  Lab 08/10/20 0303 08/10/20 1657 08/11/20 0355  WBC 12.9* 9.8 7.9  RBC 3.59* 3.10* 2.85*  HGB 11.3* 9.9* 9.4*  HCT 33.2* 28.8* 26.5*  MCV 92.5 92.9 93.0  MCH 31.5 31.9 33.0  MCHC 34.0 34.4 35.5  RDW 13.7 14.2 14.0  PLT 233 141* 102*    Cardiac Enzymes High Sensitivity Troponin:  No results for input(s): TROPONINIHS in the last 720 hours.      Lab Results  Component Value Date   CHOL 102 03/09/2020   HDL 39 (L) 03/09/2020   LDLCALC 45 03/09/2020   TRIG 90 03/09/2020   CHOLHDL 2.6 03/09/2020   Lab Results  Component Value Date   ALT 41 06/16/2020   AST 43 (H) 06/16/2020   ALKPHOS 68 06/16/2020   BILITOT 1.6 (H) 06/16/2020   Lab Results   Component Value Date   TSH 2.16 09/15/2014   Lab Results  Component Value Date   HGBA1C 5.4 08/09/2020    BNPNo results for input(s): BNP, PROBNP in the last 168 hours.   Cardiology Studies    ECHO: 08/02/2020 1. Left ventricular ejection fraction, by estimation, is 60 to 65%. The  left ventricle has normal function. The left ventricle has no regional  wall motion abnormalities. Left ventricular diastolic parameters were  normal.  2. Right ventricular systolic function is normal. The right ventricular  size is normal. Tricuspid regurgitation signal is inadequate for assessing  PA pressure.  3. The mitral valve is normal in structure. No evidence of mitral valve  regurgitation. No evidence of mitral stenosis.  4. The aortic valve is normal in structure. Aortic valve regurgitation is  not visualized. No aortic stenosis is present.  5. The inferior vena cava is normal in size with greater than 50%  respiratory variability, suggesting right atrial pressure of 3 mmHg.   CARDIAC CATH: 08/02/2020  Prox LAD lesion is 95% stenosed.  Prox LAD to Mid LAD lesion is 80% stenosed.  1st Mrg lesion is 75% stenosed.  2nd Mrg lesion is 60% stenosed.  Prox RCA to Mid RCA lesion is 70% stenosed.  Previously placed Mid RCA-1 stent (unknown type) is widely patent.  Mid RCA-2 lesion is 50% stenosed.  The left ventricular systolic function is normal.  LV end diastolic pressure is mildly elevated.  The left ventricular ejection fraction is 55-65% by visual estimate.   1. 3 vessel obstructive CAD    - critical 95% proximal LAD    - 70% OM1    - 60% OM2    - 70% mid RCA proximal to prior stent with abnormal RFR 2. Normal LV function 3. Mildly elevated LVEDP  Plan: recommend CABG. Will hold Plavix. Admit to telemetry and treat with IV heparin. Check Echo.  Diagnostic Dominance: Right      Radiology    DG Chest Port 1 View  Result Date: 08/10/2020 CLINICAL DATA:  Chest  tube. EXAM: PORTABLE CHEST 1 VIEW COMPARISON:  08/09/2020. FINDINGS: Interim extubation and removal of NG tube. Right IJ line, mediastinal drainage catheter, left chest tube in stable position. Persistent left base atelectasis. Tiny bilateral pleural effusions can not be excluded. No pneumothorax. IMPRESSION: 1. Interim extubation and removal of NG tube. Right IJ line, mediastinal drainage catheter, left chest tube in stable position. No  pneumothorax. 2. Persistent left base atelectasis. Tiny bilateral pleural effusions cannot be excluded. Electronically Signed   By: Marcello Moores  Register   On: 08/10/2020 06:35   DG Chest Port 1 View  Result Date: 08/09/2020 CLINICAL DATA:  Follow-up CABG EXAM: PORTABLE CHEST 1 VIEW COMPARISON:  08/08/2020 FINDINGS: Status post median sternotomy and CABG. Endotracheal tube tip 6 cm above the carina right internal jugular venous access she. Internal catheter tip in the SVC at the azygos level. Swan-Ganz catheter placed from a femoral approach has its tip in the left lower lobe pulmonary artery. Left chest tube in place. No pneumothorax. Moderate atelectasis in the left lower lobe. IMPRESSION: Lines and tubes well positioned. No pneumothorax. Moderate atelectasis in the left lower lobe. Electronically Signed   By: Nelson Chimes M.D.   On: 08/09/2020 14:36    Telemetry    Largely sinus rhythm, brief supraventricular/atrial tach yesterday- Personally Reviewed  ECG    08/10/20 SR- Personally Reviewed  Patient Profile     60 y.o. male with hx of CAD/NSTEMI s/p DES to [facility] with residual moderate LAD disease in 1975, chronic diastolic HF, HTN, HLD, CKD now with with cath for angina, associated SOB, Cath with significant CAD. S/P 4V CABG 08/09/20.  Assessment & Plan    CAD with NSTEMI Now s/p 4 V CABG by Dr. Kipp Brood on 08/09/20 (LIMA-LAD, SVG-PDA, SVG-OM1, SVG-OM2) -continue aspirin 325 mg daily for now. Given that he presented with NSTEMI, would change to aspirin 81 mg and  clopidogrel 75 mg for one year prior to discharge -continue atorvastatin 80 mg daily -has been restarted on beta blocker, now on metoprolol, was on carvedilol prior. Will monitor BP and adjust as necessary -chest tube coming out today, plan to transfer to the floor  LE edema: -mild, suspect component of venous insufficiency.  Post op atrial tachycardia: -very brief, within 48 hours of surgery. No anticoagulation or antiarrhythmic at this time.  Hypertension: - see above meds, plus Lasix 40 mg qd - BP generally well-controlled  Chronic diastolic heart failure: -preop EF 60-65%  Hypercholesterolemia: -prior to admission, was on atorvastatin 40 mg daily, but last LDL 02/2020 was 90. Goal <70 -increased atorvastatin to 80 mg daily this admission, recheck lipids at follow up  CKD stage II: -Cr 1.19 today, GFR >60  Buford Dresser, MD, PhD, [place]  [address], [place], [place] [address] [phone]  Buford Dresser, MD 08/11/2020

## 2020-08-11 NOTE — Consult Note (Signed)
WOC Nurse Consult Note: Patient receiving care in [place]. Reason for Consult: Unna boots--in this facility, these are placed by the Ortho Tech Wound type: Small, superficial wounds BLE pretibial areas; wound beds pink. Pressure Injury POA: Yes/No/NA Measurement: Wound bed: pink Drainage (amount, consistency, odor) none Periwound: hemosiderin staining Dressing procedure/placement/frequency: PERFORM on the days that the [employer] are changed.  Wash legs, dry, moisturize with [place] Moisturizing Ointment. Place Vaseline gauzes over BLE wounds, then square foam dressings.  When this is completed, call the Ortho Tech to place/change the unna boots. Thank you for the consult.  Discussed plan of care with the patient and bedside nurse.  [place] nurse will not follow at this time.  Please re-consult the [place] team if needed.  Val Riles, RN, MSN, CWOCN, CNS-BC, pager [phone]

## 2020-08-11 NOTE — Progress Notes (Signed)
Orthopedic Tech Progress Note Patient Details:  Lonnie Skinner [birth year] [id number]  Ortho Devices Type of Ortho Device: Louretta Parma boot Ortho Device/Splint Location: BLE Ortho Device/Splint Interventions: Ordered,Application,Adjustment   Post Interventions Patient Tolerated: Well Instructions Provided: Care of device,Poper ambulation with device   [name] 08/11/2020, 3:22 PM

## 2020-08-11 NOTE — Telephone Encounter (Addendum)
   I am covering Trish/cardmaster inbasket today. Received request from surgical team for anticipated 2 week post-CABG follow-up (just had surgery 3/22 so will likely be in the hospital several more days). The patient is scheduled for next available post-hospital appt 4/11 with Richardson Dopp PA-C. Appt info placed on AVS. Sending this to [facility] team to follow along for discharge date as he will need a TOC call after discharge. Thanks! Noah Pelaez PA-C

## 2020-08-11 NOTE — Progress Notes (Signed)
Report called to 4E19 RN. Patient resting comfortable in chair. Awaiting [employer] arrival to bedside to replace portable transport monitor to enable patient transfer to new room as current monitor was replaced earlier and unable to be disconnected due to a bad battery. Charge RN aware.

## 2020-08-11 NOTE — Plan of Care (Signed)
  Problem: Education: Goal: Knowledge of General Education information will improve Description: Including pain rating scale, medication(s)/side effects and non-pharmacologic comfort measures Outcome: Progressing   Problem: Health Behavior/Discharge Planning: Goal: Ability to manage health-related needs will improve Outcome: Progressing   Problem: Clinical Measurements: Goal: Ability to maintain clinical measurements within normal limits will improve Outcome: Progressing Goal: Will remain free from infection Outcome: Progressing Goal: Diagnostic test results will improve Outcome: Progressing Goal: Respiratory complications will improve Outcome: Progressing Goal: Cardiovascular complication will be avoided Outcome: Progressing   Problem: Activity: Goal: Risk for activity intolerance will decrease Outcome: Progressing   Problem: Nutrition: Goal: Adequate nutrition will be maintained Outcome: Progressing   Problem: Elimination: Goal: Will not experience complications related to bowel motility Outcome: Progressing Goal: Will not experience complications related to urinary retention Outcome: Progressing   Problem: Pain Managment: Goal: General experience of comfort will improve Outcome: Progressing   Problem: Safety: Goal: Ability to remain free from injury will improve Outcome: Progressing   Problem: Skin Integrity: Goal: Risk for impaired skin integrity will decrease Outcome: Progressing   Problem: Activity: Goal: Risk for activity intolerance will decrease Outcome: Progressing   Problem: Cardiac: Goal: Will achieve and/or maintain hemodynamic stability Outcome: Progressing   Problem: Coping: Goal: Level of anxiety will decrease Outcome: Not Progressing

## 2020-08-11 NOTE — Progress Notes (Addendum)
Hospital Consult    Reason for Consult:  Venous stasis ulcers Requesting Physician:   MRN #:  [id number]  History of Present Illness: This is a 60 y.o. male who is postop day #2 status post coronary artery bypass grafting involving saphenous vein harvest from bilateral lower extremities.  He is being seen in consultation for evaluation of venous stasis disease with ulcerations of bilateral lower extremities.  Patient states he previously had venous stasis management with the [facility] which involved unna boots in the past.  He has been dealing with edema and discoloration of his lower extremities for past several years.  He has a strong family history of venous disease.  He admittedly does not wear compression or elevate his legs during the day.  He has history of DVT on his chart from 45 however is unsure of the details.  He denies claudication, rest pain BLE.  He is a former smoker.  Past Medical History:  Diagnosis Date  . CAD (coronary artery disease)    a. 05/2013: NSTEMI s/p DES to mRCA (culprit vessel), residual moderate LAD disease (the patient fails medical therapy and this was found to be significant, this would be amenable to PCI).  . CHF (congestive heart failure) ([place])   . CKD (chronic kidney disease), stage II   . DVT (deep venous thrombosis) ([place]) 1981  . Hypercholesteremia   . Hyperglycemia   . Hypertension     Past Surgical History:  Procedure Laterality Date  . CORONARY ARTERY BYPASS GRAFT N/A 08/09/2020   Procedure: CORONARY ARTERY BYPASS GRAFTING (CABG) X  FOUR   USING  LEFT INTERNAL MAMMARY ARTERY HARVEST AND RIGHT AND LEFT ENDOSCOPIC SAPHENOUS VEIN HARVEST;  Surgeon: [name], MD;  Location: [place];  Service: Open Heart Surgery;  Laterality: N/A;  . CORONARY STENT PLACEMENT  05/2013   mRCA  . CORONARY/GRAFT ACUTE MI REVASCULARIZATION  2015  . INTRAVASCULAR PRESSURE WIRE/FFR STUDY N/A 08/02/2020   Procedure: INTRAVASCULAR PRESSURE WIRE/FFR STUDY;   Surgeon: [name], [name], MD;  Location: [place] CV LAB;  Service: Cardiovascular;  Laterality: N/A;  . KNEE SURGERY Left 2009  . LEFT HEART CATH AND CORONARY ANGIOGRAPHY N/A 08/02/2020   Procedure: LEFT HEART CATH AND CORONARY ANGIOGRAPHY;  Surgeon: [name], [name], MD;  Location: [place] CV LAB;  Service: Cardiovascular;  Laterality: N/A;  . LEFT HEART CATHETERIZATION WITH CORONARY ANGIOGRAM N/A 06/02/2013   Procedure: LEFT HEART CATHETERIZATION WITH CORONARY ANGIOGRAM;  Surgeon: [name], MD;  Location: [facility] CATH LAB;  Service: Cardiovascular;  Laterality: N/A;  . PERCUTANEOUS CORONARY STENT INTERVENTION (PCI-S)  06/02/2013   Procedure: PERCUTANEOUS CORONARY STENT INTERVENTION (PCI-S);  Surgeon: [name], MD;  Location: [facility] CATH LAB;  Service: Cardiovascular;;  . TEE WITHOUT CARDIOVERSION N/A 08/09/2020   Procedure: TRANSESOPHAGEAL ECHOCARDIOGRAM (TEE);  Surgeon: [name], MD;  Location: [place];  Service: Open Heart Surgery;  Laterality: N/A;    Allergies  Allergen Reactions  . Penicillins     Unknown reaction    Prior to Admission medications   Medication Sig Start Date End Date Taking? Authorizing Provider  ALPRAZolam (XANAX) 0.25 MG tablet TAKE 1 TABLET(0.25 MG) BY MOUTH TWICE DAILY AS NEEDED FOR ANXIETY Patient taking differently: Take 0.25 mg by mouth 2 (two) times daily as needed for anxiety. 03/09/20  Yes Meccariello, Bernita Raisin, DO  aspirin 81 MG EC tablet Take 1 tablet (81 mg total) by mouth daily. 10/30/18  Yes Nuala Alpha, DO  atorvastatin (  LIPITOR) 40 MG tablet Take 1 tablet (40 mg total) by mouth every evening. 01/20/20  Yes Meccariello, Bernita Raisin, DO  carvedilol (COREG) 12.5 MG tablet Take 1 tablet (12.5 mg total) by mouth 2 (two) times daily. 06/03/19  Yes Nuala Alpha, DO  clopidogrel (PLAVIX) 75 MG tablet Take 1 tablet (75 mg total) by mouth daily. 01/20/20  Yes Meccariello, Bernita Raisin, DO  furosemide (LASIX) 40 MG tablet Take 1 tablet (40  mg total) by mouth daily. 01/20/20  Yes Meccariello, Bernita Raisin, DO  isosorbide mononitrate (IMDUR) 30 MG 24 hr tablet TAKE 1 TABLET BY MOUTH DAILY, PLEASE SCHEDULE OVERDUE APPOINTMENT FOR FUTURE REFILLS 1ST ATTEMPT Patient taking differently: Take 30 mg by mouth daily. 06/03/19  Yes Lockamy, Timothy, DO  nitroGLYCERIN (NITROSTAT) 0.4 MG SL tablet Place 1 tablet (0.4 mg total) under the tongue every 5 (five) minutes as needed for chest pain (up to 3 doses). 05/01/17  Yes Dorothy Spark, MD  clotrimazole-betamethasone (LOTRISONE) cream APPLY TO THE AFFECTED AREA TWICE DAILY Patient not taking: Reported on 07/27/2020 11/05/17   Nuala Alpha, DO  triamcinolone cream (KENALOG) 0.1 % APPLY TOPICALLY TO THE AFFECTED AREA TWICE DAILY Patient not taking: Reported on 07/27/2020 10/16/17   Nuala Alpha, DO    Social History   Socioeconomic History  . Marital status: Widowed    Spouse name: Not on file  . Number of children: Not on file  . Years of education: Not on file  . Highest education level: Not on file  Occupational History    Comment: Disabled  Tobacco Use  . Smoking status: Former Research scientist (life sciences)  . Smokeless tobacco: Never Used  . Tobacco comment: Quit ~2007  Vaping Use  . Vaping Use: Never used  Substance and Sexual Activity  . Alcohol use: No  . Drug use: No  . Sexual activity: Not Currently  Other Topics Concern  . Not on file  Social History Narrative  . Not on file   Social Determinants of Health   Financial Resource Strain: Not on file  Food Insecurity: Not on file  Transportation Needs: Not on file  Physical Activity: Not on file  Stress: Not on file  Social Connections: Not on file  Intimate Partner Violence: Not on file     Family History  Problem Relation Age of Onset  . CAD Other        Multiple family members with CABG  . Heart disease Mother        CABG  . Heart attack Mother   . Stroke Mother   . Heart failure Mother   . Cancer Father   . Heart disease  Brother        cabg  . Heart attack Brother     ROS: Otherwise negative unless mentioned in HPI  Physical Examination  Vitals:   08/11/20 0807 08/11/20 0900  BP:  125/82  Pulse:  81  Resp:  (!) 22  Temp: 98.1 F (36.7 C)   SpO2:  100%   Body mass index is 30.3 kg/m.  General:  WDWN in NAD Gait: Not observed HENT: WNL, normocephalic Pulmonary: normal non-labored breathing with O2 by [place] Cardiac: regular Abdomen:  soft, NT/ND, no masses Skin: without rashes Vascular Exam/Pulses: symmetrical DP pulses Extremities: Stasis pigmentation changes right more so than left; venous ulcerations bilateral shins Musculoskeletal: no muscle wasting or atrophy  Neurologic: A&O X 3;  No focal weakness or paresthesias are detected; speech is fluent/normal Psychiatric:  The pt has Normal  affect. Lymph:  Unremarkable  CBC    Component Value Date/Time   WBC 7.9 08/11/2020 0355   RBC 2.85 (L) 08/11/2020 0355   HGB 9.4 (L) 08/11/2020 0355   HGB 14.3 07/27/2020 1052   HCT 26.5 (L) 08/11/2020 0355   HCT 42.0 07/27/2020 1052   PLT 102 (L) 08/11/2020 0355   PLT 210 07/27/2020 1052   MCV 93.0 08/11/2020 0355   MCV 91 07/27/2020 1052   MCH 33.0 08/11/2020 0355   MCHC 35.5 08/11/2020 0355   RDW 14.0 08/11/2020 0355   RDW 13.7 07/27/2020 1052   LYMPHSABS 1.7 06/16/2020 1555   LYMPHSABS 2.0 01/04/2017 1610   MONOABS 0.3 06/16/2020 1555   EOSABS 0.1 06/16/2020 1555   EOSABS 0.2 01/04/2017 1610   BASOSABS 0.0 06/16/2020 1555   BASOSABS 0.0 01/04/2017 1610    BMET    Component Value Date/Time   NA 134 (L) 08/11/2020 0355   NA 142 07/27/2020 1052   K 3.7 08/11/2020 0355   CL 104 08/11/2020 0355   CO2 27 08/11/2020 0355   GLUCOSE 118 (H) 08/11/2020 0355   BUN 19 08/11/2020 0355   BUN 20 07/27/2020 1052   CREATININE 1.19 08/11/2020 0355   CREATININE 1.34 (H) 05/10/2016 1121   CALCIUM 8.2 (L) 08/11/2020 0355   GFRNONAA >60 08/11/2020 0355   GFRAA 63 03/09/2020 1459    COAGS: Lab  Results  Component Value Date   INR 1.6 (H) 08/09/2020   INR 1.1 08/09/2020   INR 1.1 06/16/2020     Non-Invasive Vascular Imaging:   None   ASSESSMENT/PLAN: This is a 60 y.o. male status post CABG POD #2 involving bilateral saphenous vein harvest; patient with venous stasis ulcerations of bilateral lower extremities  -Bilateral lower extremities are well perfused with palpable DP pulses -Venous stasis changes with pigmentation and ulcerations of bilateral lower extremities R worse than L leg -Saphenous vein harvested in bilateral lower extremity should help venous symptoms going forward -Recommend application of Unna boots which should be changed weekly; Needles RN consulted; once ulcerations are healed he should be fitted for 15 to 20 mmHg knee-high compression stockings which should be worn regularly; we also discussed making an effort to elevate his legs periodically throughout the day -He can follow-up as an outpatient to recheck venous ulcerations -On call vascular surgeon Dr. Carlis Abbott will evaluate the patient later today and provide further treatment plans   Dagoberto Ligas PA-C [facility] [phone]   I have seen and evaluated the patient. I agree with the PA note as documented above. 60 yo M POD#2 s/p CABGx4 with BLE GSV harvest.  Vascular surgery consulted for lower extremity venous stasis ulcers.  Patient states these are followed at the [place] and he has not been wearing compression.  No venous reflux study to review so unclear if this is deep or superficial reflux.  Would recommend [employer] with wound consult and once healed will need bilateral lower extremity compression with elevation and exercise.  No signs of arterial insufficiency with palpable DP pulses bilaterally.  We could see him as outpatient with formal venous reflux if he prefers unless he wants to continue following with the [place].  Right leg worse than left as pictured below.  CEAP classification C6  with active ulceration.        Marty Heck, MD [facility] [place] Office: [phone]

## 2020-08-11 NOTE — Progress Notes (Signed)
Patient admitted to 4E from [place]. CHG bath completed. Telemetry box applied, CCMD notified. VSS. Patient oriented to staff and room. Call bell in reach. Will continue to monitor.

## 2020-08-11 NOTE — Discharge Summary (Signed)
Physician Discharge Summary  Patient ID: Lonnie Skinner MRN: [id number] DOB/AGE: [birth year] 60 y.o.  Admit date: 08/02/2020 Discharge date: 08/14/2020  Admission Diagnoses:  Patient Active Problem List   Diagnosis Date Noted  . Chest pain, exertional 08/02/2020  . Unstable angina ([place]) 08/02/2020  . Low blood pressure, not hypotension 06/22/2020  . COVID-19 virus infection 06/22/2020  . Grieving 06/22/2020  . Chronic venous insufficiency 01/04/2017  . Acute on chronic diastolic CHF (congestive heart failure) ([place]) 05/10/2016  . CAD (coronary artery disease)   . CKD (chronic kidney disease), stage II   . History of tobacco abuse 06/02/2013  . Acute myocardial infarction of other inferior wall, initial episode of care   . NSTEMI (non-ST elevated myocardial infarction) ([place]) 06/01/2013  . Hyperlipidemia 05/19/2009  . Generalized anxiety disorder 05/19/2009  . NICOTINE ADDICTION 05/19/2009  . ADD 05/19/2009  . Essential hypertension, benign 05/19/2009  . UNEQUAL LEG LENGTH 05/19/2009   Discharge Diagnoses:   Patient Active Problem List   Diagnosis Date Noted  . S/P CABG x 4 08/09/2020  . Chest pain, exertional 08/02/2020  . Unstable angina ([place]) 08/02/2020  . Low blood pressure, not hypotension 06/22/2020  . COVID-19 virus infection 06/22/2020  . Grieving 06/22/2020  . Chronic venous insufficiency 01/04/2017  . Acute on chronic diastolic CHF (congestive heart failure) ([place]) 05/10/2016  . CAD (coronary artery disease)   . CKD (chronic kidney disease), stage II   . History of tobacco abuse 06/02/2013  . Acute myocardial infarction of other inferior wall, initial episode of care   . NSTEMI (non-ST elevated myocardial infarction) ([place]) 06/01/2013  . Hyperlipidemia 05/19/2009  . Generalized anxiety disorder 05/19/2009  . NICOTINE ADDICTION 05/19/2009  . ADD 05/19/2009  . Essential hypertension, benign 05/19/2009  . UNEQUAL LEG LENGTH 05/19/2009     History of Present  Illness:    at time of consult  The patient is a 60 year old male with a significant history of cardiac disease including non-STEMI with placement of drug-eluting stent to the mid RCA in 2015.  At that time he had residual moderate LAD disease.  He was last seen by cardiology in February 2021 at which time he was doing well on current medical regimen.  He was recently evaluated in the ER on 07/27/2020 after an episode of chest pressure while walking his dog that was associated with shortness of breath. These symptoms have been recurring in retrospect for about a month.   Symptoms were resolved with rest.  He was seen by cardiology and subsequently cardiac catheterization was scheduled due to high risk of progression of previous coronary artery disease.  Catheterization was scheduled on today's date.  He was found to have severe three-vessel coronary artery disease including critical 95% proximal LAD lesion, 70% obtuse marginal #1 lesion, 60% obtuse marginal #2 as well as 70% mid RCA lesion proximal to previous drug-eluting stent which was found to have abnormal RFR.  LV function was noted to be normal.  He did have mildly elevated LVEDP.  Due to these findings cardiothoracic surgical consultation is requested for consideration of coronary artery surgical revascularization.  Plans are also noted to check echocardiogram and patient will require a Plavix washout.  The patient is a former smoker who quit in 2007.  He does have a history of previous DVT in 1981.  He has stage II chronic kidney disease with creatinine noted on 07/27/2020 to be 1.14 prior to catheterization.   Discharged Condition: good  Hospital Course:  The patient  was medically stabilized with a heparin drip and cardiology management as he had a Plavix washout.  Prior to proceeding with surgical intervention.  Lonnie Skinner underwent a CABG x 4 with Dr. Kipp Brood on 08/09/2020. He tolerated the procedure well and was transferred to the surgical ICU in  stable condition.   Postoperative hospital course:  Patient is doing well.  He was weaned from the ventilator, neurologically intact , using standard protocols.  Initially he did require some Neo-Synephrine but this was discontinued over time as hemodynamics were stabilized.  He is also started on a beta-blocker, aspirin and statin.  Chest tubes were removed on postop day #2.  Renal function has remained in the normal range and he has good urine output.  He is maintaining normal sinus rhythm with some very brief episodes of atrial fibrillation .  He does not have baseline anxiety and is started on Xanax.  He does have an expected acute blood loss anemia and we are trending hemoglobin hematocrit.  He currently is not in the transfusion threshold range. POD 3 he continued to progress and was transferred to the telemetry floor. He did have some chronic non-healing ulcers from chronic venous stasis which were treated with unna boots. Since the patient lives alone and will need a lot of encouragement post-op, we consulted PT to evaluate for SNF. They recommended home health PT.  However patient ambulating without difficulty with rolling walker and he doesn't feel this was needed.  He was resumed on home regimen of Lasix.  He will be transitioned to home regimen of Coreg.  His surgical incisions are healing without evidence of infection.  His chronic ulcerations from venous stasis are stable and he will [facility] [place].  He is felt to be medically stable for discharge home today.  Consults: vascular surgery  Significant Diagnostic Studies:  CLINICAL DATA:  Chest tube. History of pleural effusion. Sore chest.  EXAM: PORTABLE CHEST 1 VIEW  COMPARISON:  08/10/2020.  FINDINGS: Right IJ line, mediastinal drainage catheter, left chest tube in stable position. Prior CABG. Heart size stable. Low lung volumes with persistent left base subsegmental atelectasis. Tiny bilateral pleural  effusions again cannot be excluded. No pneumothorax. Moderate gastric distention.  IMPRESSION: 1. Lines and tubes including left chest tube in stable position. No pneumothorax. 2. Prior CABG. Heart size stable. 3. Persistent left base subsegmental atelectasis. Tiny bilateral pleural effusions cannot be excluded. 4. Moderate gastric distention.   Electronically Signed   By: Marcello Moores  Register   On: 08/11/2020 06:46   Treatments:  08/09/2020 Patient:  Lonnie Skinner Pre-Op Dx:     Coronary artery disease                         CKD                         HTN                                                               Post-op Dx:  same Procedure: CABG X 4.  LIMA LAD, RSVG PDA, OM1, OM2   Endoscopic greater saphenous vein harvest on the right and left   Surgeon and Role:      *  [name], MD - Primary    * T. Harriet Pho, PA-C - assisting  Anesthesia  general EBL:  548ml Blood Administration: none Xclamp Time:  67 min Pump Time:  140min  Drains: 28 F blake drain: L, mediastinal  Wires: none Counts: correct   Indications: 60 year old gentleman admitted with an NSTEMI. Left heart cath revealed severe three-vessel disease. Patient has good targets for surgical bypass. Left heart cath. RV and LV function. There is no significant valvular disease. OR today for CABG 4 Findings: Bicuspid aortic valve on TEE.  No valve dysfunction.  Small LIMA, small vein.  Calcified LAD.  Good PDA.  Intramyocardial OM2.  Good OM1.  Good flows on all vein grafts.     Discharge Exam: Blood pressure 122/87, pulse 83, temperature 97.6 F (36.4 C), temperature source Oral, resp. rate 20, height 5\' 8"  (1.727 m), weight 90.2 kg, SpO2 98 %.   General appearance: alert, cooperative and no distress Heart: regular rate and rhythm Lungs: clear to auscultation bilaterally Abdomen: soft, non-tender; bowel sounds normal; no masses,  no organomegaly Extremities: edema  trace Wound: clean and dry sternotomy, EVH sites.. vascular ulcers stable   Discharge disposition: 01-Home or Self Care   Discharge Instructions    Amb Referral to Cardiac Rehabilitation   Complete by: As directed    Diagnosis: CABG   CABG X ___: 4   After initial evaluation and assessments completed: Virtual Based Care may be provided alone or in conjunction with Phase 2 Cardiac Rehab based on patient barriers.: Yes     Allergies as of 08/14/2020      Reactions   Penicillins    Unknown reaction      Medication List    STOP taking these medications   isosorbide mononitrate 30 MG 24 hr tablet Commonly known as: IMDUR     TAKE these medications   acetaminophen 325 MG tablet Commonly known as: TYLENOL Take 2 tablets (650 mg total) by mouth every 6 (six) hours as needed for mild pain, headache or fever.   ALPRAZolam 0.25 MG tablet Commonly known as: XANAX TAKE 1 TABLET(0.25 MG) BY MOUTH TWICE DAILY AS NEEDED FOR ANXIETY What changed:   how much to take  how to take this  when to take this  reasons to take this  additional instructions   aspirin 81 MG EC tablet Take 1 tablet (81 mg total) by mouth daily.   atorvastatin 80 MG tablet Commonly known as: LIPITOR Take 1 tablet (80 mg total) by mouth every evening. What changed:   medication strength  how much to take   carvedilol 3.125 MG tablet Commonly known as: Coreg Take 1 tablet (3.125 mg total) by mouth 2 (two) times daily. What changed:   medication strength  how much to take   clopidogrel 75 MG tablet Commonly known as: PLAVIX Take 1 tablet (75 mg total) by mouth daily.   furosemide 40 MG tablet Commonly known as: LASIX Take 1 tablet (40 mg total) by mouth daily.   nitroGLYCERIN 0.4 MG SL tablet Commonly known as: NITROSTAT Place 1 tablet (0.4 mg total) under the tongue every 5 (five) minutes as needed for chest pain (up to 3 doses).   traMADol 50 MG tablet Commonly known as: ULTRAM Take  1 tablet (50 mg total) by mouth every 4 (four) hours as needed for moderate pain.       Follow-up Information    Lightfoot, Lucile Crater, MD Follow up.   Specialty: Cardiothoracic Surgery Why:  Please see discharge paperwork for follow-up appointment with your surgeon. Contact information: [address] [place] [address] [phone]        Dorothy Spark, MD Follow up.   Specialty: Cardiology Why: Please see discharge paperwork for follow-up appointment with cardiology.  You will have follow-up with them long-term as well so keep appointment with both cardiology as well as surgeon. Contact information: [address] [place] [address] [phone]        Richardson Dopp T, PA-C Follow up.   Specialties: Cardiology, Physician Assistant Why: [facility] (Cardiology) - [facility] location - a follow-up has been made for you for Monday August 29, 2020 at 8:45 AM (Arrive by 8:30 AM). Nicki Reaper is one of the PAs that works closely with Dr. [name] and our cardiology team. Contact information: [address] [place] [address] [phone]              The patient has been discharged on:   1.Beta Blocker:  Yes [ X  ]                              No   [   ]                              If No, reason:  2.Ace Inhibitor/ARB: Yes [   ]                                     No  [  X  ]                                     If No, reason: labile BP  3.Statin:   Yes [ X  ]                  No  [   ]                  If No, reason:  4.Ecasa:  Yes  [ X  ]                  No   [   ]                  If No, reason:   Signed:   , PA-C 08/14/2020, 9:30 AM

## 2020-08-11 NOTE — Progress Notes (Addendum)
[address]       [place] [address]             [phone]      2 Days Post-Op Procedure(s) (LRB): CORONARY ARTERY BYPASS GRAFTING (CABG) X  FOUR   USING  LEFT INTERNAL MAMMARY ARTERY HARVEST AND RIGHT AND LEFT ENDOSCOPIC SAPHENOUS VEIN HARVEST (N/A) TRANSESOPHAGEAL ECHOCARDIOGRAM (TEE) (N/A) Subjective: Feels okay this morning, biggest issue is his bottom is sore.  Objective: Vital signs in last 24 hours: Temp:  [97.5 F (36.4 C)-98.2 F (36.8 C)] 97.5 F (36.4 C) (03/24 0337) Pulse Rate:  [72-95] 78 (03/24 0700) Cardiac Rhythm: Normal sinus rhythm (03/24 0747) Resp:  [13-30] 16 (03/24 0700) BP: (79-128)/(50-78) 102/77 (03/24 0700) SpO2:  [92 %-100 %] 100 % (03/24 0700) Arterial Line BP: (84-127)/(60-92) 127/84 (03/23 1600) Weight:  [90.4 kg] 90.4 kg (03/24 0500)  Hemodynamic parameters for last 24 hours: CVP:  [8 mmHg-18 mmHg] 10 mmHg  Intake/Output from previous day: 03/23 0701 - 03/24 0700 In: 752.5 [P.O.:241; I.V.:361.5; IV Piggyback:150] Out: 1400 [Urine:1120; Chest Tube:280] Intake/Output this shift: Total I/O In: -  Out: 20 [Chest Tube:20]  General appearance: alert, cooperative and no distress Heart: regular rate and rhythm, S1, S2 normal, no murmur, click, rub or gallop Lungs: clear to auscultation bilaterally Abdomen: soft, non-tender; bowel sounds normal; no masses,  no organomegaly Extremities: extremities normal, atraumatic, no cyanosis or edema Wound: clean and dry covered with a sterile dressing  Lab Results: Recent Labs    08/10/20 1657 08/11/20 0355  WBC 9.8 7.9  HGB 9.9* 9.4*  HCT 28.8* 26.5*  PLT 141* 102*   BMET:  Recent Labs    08/10/20 1657 08/11/20 0355  NA 132* 134*  K 4.0 3.7  CL 102 104  CO2 24 27  GLUCOSE 176* 118*  BUN 17 19  CREATININE 1.00 1.19  CALCIUM 8.5* 8.2*    PT/INR:  Recent Labs    08/09/20 1410  LABPROT 18.6*  INR 1.6*   ABG    Component Value Date/Time   PHART 7.332 (L)  08/09/2020 1808   HCO3 24.8 08/09/2020 1808   TCO2 26 08/09/2020 1808   ACIDBASEDEF 1.0 08/09/2020 1808   O2SAT 99.0 08/09/2020 1808   CBG (last 3)  Recent Labs    08/10/20 2003 08/10/20 2357 08/11/20 0335  GLUCAP 137* 155* 127*    Assessment/Plan: S/P Procedure(s) (LRB): CORONARY ARTERY BYPASS GRAFTING (CABG) X  FOUR   USING  LEFT INTERNAL MAMMARY ARTERY HARVEST AND RIGHT AND LEFT ENDOSCOPIC SAPHENOUS VEIN HARVEST (N/A) TRANSESOPHAGEAL ECHOCARDIOGRAM (TEE) (N/A)  -Postop day 2 CABG x4 for multivessel coronary disease presenting with acute non-STEMI and normal ejection fraction.  NSR in the 70s, BP well controlled.  He is off all vasoactive drips.  Continue working on mobility.  Continue aspirin, atorvastatin, and low-dose metoprolol.  -Postoperative A. Fib-very brief episode of rate control A. Fib yesterday with brief episode around 1:30pm yesterday.  Continue metoprolol. Potassium 3.7.  Monitor but will not start antiarrhythmics for now.  -Mild expected acute blood loss anemia 9.4/26.5--chest tube drainage is tapering off.  Monitor.  -History of anxiety-managed with Xanax prior to surgery, this has been resumed.  -DVT prophylaxis-continue working on mobility, lovenox this evening.  -Chronic venous insufficiency-has evidence of venous stasis in his lower legs.  Continue topical skin care and Kerlix wraps to protect these lesions  Plan: See progression orders per surgeon. Encouraged to ambulate around the unit and use  his incentive spirometer.      LOS: 9 days    Lonnie Skinner 08/11/2020   Agree with above HD stable.  On BB Will transfer to floor today Will remove CT today.  Lonnie Skinner

## 2020-08-12 LAB — BASIC METABOLIC PANEL
Anion gap: 4 — ABNORMAL LOW (ref 5–15)
BUN: 19 mg/dL (ref 6–20)
CO2: 26 mmol/L (ref 22–32)
Calcium: 8.7 mg/dL — ABNORMAL LOW (ref 8.9–10.3)
Chloride: 102 mmol/L (ref 98–111)
Creatinine, Ser: 1.29 mg/dL — ABNORMAL HIGH (ref 0.61–1.24)
GFR, Estimated: >60 mL/min (ref >60)
Glucose, Bld: 141 mg/dL — ABNORMAL HIGH (ref 70–99)
Potassium: 3.8 mmol/L (ref 3.5–5.1)
Sodium: 132 mmol/L — ABNORMAL LOW (ref 135–145)

## 2020-08-12 LAB — CBC
HCT: 28.7 % — ABNORMAL LOW (ref 39.0–52.0)
Hemoglobin: 9.5 g/dL — ABNORMAL LOW (ref 13.0–17.0)
MCH: 31.5 pg (ref 26.0–34.0)
MCHC: 33.1 g/dL (ref 30.0–36.0)
MCV: 95 fL (ref 80.0–100.0)
Platelets: 125 10*3/uL — ABNORMAL LOW (ref 150–400)
RBC: 3.02 MIL/uL — ABNORMAL LOW (ref 4.22–5.81)
RDW: 14.3 % (ref 11.5–15.5)
WBC: 9.8 10*3/uL (ref 4.0–10.5)
nRBC: 0 % (ref 0.0–0.2)

## 2020-08-12 LAB — GLUCOSE, CAPILLARY
Glucose-Capillary: 105 mg/dL — ABNORMAL HIGH (ref 70–99)
Glucose-Capillary: 102 mg/dL — ABNORMAL HIGH (ref 70–99)
Glucose-Capillary: 102 mg/dL — ABNORMAL HIGH (ref 70–99)
Glucose-Capillary: 126 mg/dL — ABNORMAL HIGH (ref 70–99)

## 2020-08-12 NOTE — Progress Notes (Signed)
[facility]  Subjective  - got his [employer] yesterday.  Objective 98/60 78 (!) 97.4 F (36.3 C) (Oral) 17 96%  Intake/Output Summary (Last 24 hours) at 08/12/2020 1227 Last data filed at 08/12/2020 0724 Gross per 24 hour  Intake 490 ml  Output 500 ml  Net -10 ml    BLE wrapped with Unna Boots DP palpable bilaterally  Laboratory Lab Results: Recent Labs    08/11/20 0355 08/12/20 0150  WBC 7.9 9.8  HGB 9.4* 9.5*  HCT 26.5* 28.7*  PLT 102* 125*   BMET Recent Labs    08/11/20 0355 08/12/20 0150  NA 134* 132*  K 3.7 3.8  CL 104 102  CO2 27 26  GLUCOSE 118* 141*  BUN 19 19  CREATININE 1.19 1.29*  CALCIUM 8.2* 8.7*    COAG Lab Results  Component Value Date   INR 1.6 (H) 08/09/2020   INR 1.1 08/09/2020   INR 1.1 06/16/2020   No results found for: PTT  Assessment/Planning:  60 year old male postop day 3 status post CABG that vascular surgery was consulted for bilateral lower extremity venous stasis ulcers.  He got his Unna boots applied yesterday and appreciate the orthopedic tech for applying those.  Discussed options moving forward and he will need to stay in the Unna boots until his ulcers heal and then will need to be transitioned to compression stockings.  I have advised that he needs a formal venous reflux study which we can arrange as an outpatient and he would prefer to follow with our office at [facility] rather than the [place].  Given bilateral great saphenous vein harvest, I suspect most of his remaining reflux will be deep reflux that will be medically managed.  Will arrange follow-up in 1 month for venous reflux study.  Would need outpatient referral to wound center for [facility] dressing changes until his wounds heal.  Marty Heck 08/12/2020 12:27 PM --

## 2020-08-12 NOTE — Progress Notes (Addendum)
      [address]       [place] [address]             [phone]      3 Days Post-Op Procedure(s) (LRB): CORONARY ARTERY BYPASS GRAFTING (CABG) X  FOUR   USING  LEFT INTERNAL MAMMARY ARTERY HARVEST AND RIGHT AND LEFT ENDOSCOPIC SAPHENOUS VEIN HARVEST (N/A) TRANSESOPHAGEAL ECHOCARDIOGRAM (TEE) (N/A)   Subjective:  Patient with complaints of pain at times.  He also became nauseated with ambulation yesterday.  + BM  Objective: Vital signs in last 24 hours: Temp:  [97.4 F (36.3 C)-98.1 F (36.7 C)] 98.1 F (36.7 C) (03/25 0723) Pulse Rate:  [62-90] 84 (03/25 0723) Cardiac Rhythm: Normal sinus rhythm (03/25 0315) Resp:  [13-26] 18 (03/25 0723) BP: (89-130)/(64-86) 103/64 (03/25 0723) SpO2:  [92 %-100 %] 95 % (03/25 0723) Weight:  [90 kg] 90 kg (03/25 0300)  Intake/Output from previous day: 03/24 0701 - 03/25 0700 In: 500 [P.O.:480; I.V.:20] Out: 1640 [Urine:1600; Chest Tube:40] Intake/Output this shift: Total I/O In: 240 [P.O.:240] Out: -   General appearance: alert, cooperative and no distress Heart: regular rate and rhythm Lungs: clear to auscultation bilaterally Abdomen: soft, non-tender; bowel sounds normal; no masses,  no organomegaly Extremities: edema trace Wound: clean and dry, UNA boots bilaterally  Lab Results: Recent Labs    08/11/20 0355 08/12/20 0150  WBC 7.9 9.8  HGB 9.4* 9.5*  HCT 26.5* 28.7*  PLT 102* 125*   BMET:  Recent Labs    08/11/20 0355 08/12/20 0150  NA 134* 132*  K 3.7 3.8  CL 104 102  CO2 27 26  GLUCOSE 118* 141*  BUN 19 19  CREATININE 1.19 1.29*  CALCIUM 8.2* 8.7*    PT/INR:  Recent Labs    08/09/20 1410  LABPROT 18.6*  INR 1.6*   ABG    Component Value Date/Time   PHART 7.332 (L) 08/09/2020 1808   HCO3 24.8 08/09/2020 1808   TCO2 26 08/09/2020 1808   ACIDBASEDEF 1.0 08/09/2020 1808   O2SAT 99.0 08/09/2020 1808   CBG (last 3)  Recent Labs    08/11/20 2114 08/11/20 2338 08/12/20 0619   GLUCAP 71 76 105*    Assessment/Plan: S/P Procedure(s) (LRB): CORONARY ARTERY BYPASS GRAFTING (CABG) X  FOUR   USING  LEFT INTERNAL MAMMARY ARTERY HARVEST AND RIGHT AND LEFT ENDOSCOPIC SAPHENOUS VEIN HARVEST (N/A) TRANSESOPHAGEAL ECHOCARDIOGRAM (TEE) (N/A)  1. CV- NSR, BP labile- on Lopressor 12.5 mg BID 2. Pulm- no acute issues, continue IS 3. Renal- creatinine stable, weight minimally elevated, no diuretics at this time 4. Venous Insufficiency with bilateral non-healing ulcers- Vascular surgery evaluated, una boots in place, wound care 5. H/O GAD on home xanax 6. Deconditioning- patient lives alone, requires a lot of encouragement and reassurance to ambulate, will get PT evaluation for SNF placement 7. dispo- patient stable, continue current care   LOS: 10 days    Ellwood Handler, PA-C 08/12/2020   Agree with above Doing well dispo planning, likely SNF   Laura Radilla O Zavion Sleight

## 2020-08-12 NOTE — Progress Notes (Addendum)
CARDIAC REHAB PHASE I   PRE:  Rate/Rhythm: 78 SR    BP: sitting 99/75    SaO2: 96 RA  MODE:  Ambulation: 790 ft   POST:  Rate/Rhythm: 92 SR    BP: sitting 116/75     SaO2: 97 RA  Pt needed instructions on how to stand. Able to stand with min assist. Used RW, steady in hall. Rest x4 for DOE. VSS. Knows to walk x2 more today and work on IS.  Pt called daughter and son and educations discussed on phone. Discussed IS, sternal precautions, exercise, and CRPII. Gave pt HH diet. Will refer to [facility]. Daughter sts she can be with him for a couple hours during the day. She can cook dinner for him at her house, which is close. The son can be there 7-8 pm to 9 am. Pt sts he has a RW at home that was his wife's. [phone]  [place], ACSM 08/12/2020 10:30 AM

## 2020-08-12 NOTE — Progress Notes (Signed)
PT Cancellation Note  Patient Details Name: Lonnie Skinner MRN: [id number] DOB: 09/11/1960   Cancelled Treatment:    Reason Eval/Treat Not Completed: Other (comment).  Pt just finished mobility with mobility specialist, will see pt 3/26. 08/12/2020  Ginger Carne., PT Acute Rehabilitation Services [phone]  (pager) [phone]  (office)   Tessie Fass Lonnie Skinner 08/12/2020, 4:17 PM

## 2020-08-12 NOTE — Care Management Important Message (Signed)
Important Message  Patient Details  Name: TAVARUS POTEETE MRN: [id number] Date of Birth: [birth year]   Medicare Important Message Given:  Yes     Shelda Altes 08/12/2020, 9:05 AM

## 2020-08-12 NOTE — Progress Notes (Signed)
Mobility Specialist: Progress Note   08/12/20 1538  Mobility  Activity Ambulated in hall  Level of Assistance Minimal assist, patient does 75% or more  Assistive Device Front wheel walker  Distance Ambulated (ft) 790 ft  Mobility Response Tolerated well  Mobility performed by Mobility specialist  $Mobility charge 1 Mobility   Pre-Mobility: 86 HR Post-Mobility: 86 HR  Pt stopped to take several brief standing breaks due to DOE, recovered quickly. Pt otherwise asx. Pt to bed after walk. Encouraged pt to walk at least one more time today.   [facility] Day Mobility Specialist Mobility Specialist Phone: [phone]

## 2020-08-13 DIAGNOSIS — Z951 Presence of aortocoronary bypass graft: Secondary | ICD-10-CM

## 2020-08-13 DIAGNOSIS — I5032 Chronic diastolic (congestive) heart failure: Secondary | ICD-10-CM

## 2020-08-13 LAB — GLUCOSE, CAPILLARY
Glucose-Capillary: 55 mg/dL — ABNORMAL LOW (ref 70–99)
Glucose-Capillary: 74 mg/dL (ref 70–99)
Glucose-Capillary: 106 mg/dL — ABNORMAL HIGH (ref 70–99)
Glucose-Capillary: 80 mg/dL (ref 70–99)
Glucose-Capillary: 94 mg/dL (ref 70–99)

## 2020-08-13 MED ORDER — CLOPIDOGREL BISULFATE 75 MG PO TABS
75.0000 mg | ORAL_TABLET | Freq: Every day | ORAL | Status: DC
  Administered 2020-08-13 – 2020-08-14 (×2): 75 mg via ORAL
  Filled 2020-08-13 (×2): qty 1

## 2020-08-13 MED ORDER — ACETAMINOPHEN 160 MG/5ML PO SOLN
650.0000 mg | Freq: Four times a day (QID) | ORAL | Status: DC | PRN
  Administered 2020-08-13: 650 mg
  Filled 2020-08-13: qty 20.3

## 2020-08-13 MED ORDER — FUROSEMIDE 40 MG PO TABS
40.0000 mg | ORAL_TABLET | Freq: Every day | ORAL | Status: DC
  Administered 2020-08-13 – 2020-08-14 (×2): 40 mg via ORAL
  Filled 2020-08-13 (×2): qty 1

## 2020-08-13 MED ORDER — ASPIRIN EC 81 MG PO TBEC
81.0000 mg | DELAYED_RELEASE_TABLET | Freq: Every day | ORAL | Status: DC
  Administered 2020-08-14: 81 mg via ORAL
  Filled 2020-08-13: qty 1

## 2020-08-13 MED ORDER — ACETAMINOPHEN 325 MG PO TABS: 650.0000 mg | ORAL_TABLET | Freq: Four times a day (QID) | ORAL | Status: DC | PRN

## 2020-08-13 MED ORDER — SALINE SPRAY 0.65 % NA SOLN
1.0000 | NASAL | Status: DC | PRN
  Administered 2020-08-13: 1 via NASAL
  Filled 2020-08-13: qty 44

## 2020-08-13 NOTE — Progress Notes (Signed)
Progress Note  Patient Name: Lonnie Skinner Date of Encounter: 08/13/2020  Primary Cardiologist: Ena Dawley, MD   Subjective   Doing well overall, ambulating. Unna boots in place for edema. Hasn't had lasix today, usually takes daily, but doesn't like how much it makes him urinate. Doesn't like using urinal, prefers to use the restroom. Just had PT eval.  Inpatient Medications    Scheduled Meds: . [START ON 08/14/2020] aspirin EC  81 mg Oral Daily  . atorvastatin  80 mg Oral QPM  . clopidogrel  75 mg Oral Daily  . furosemide  40 mg Oral Daily  . mouth rinse  15 mL Mouth Rinse BID  . metoprolol tartrate  12.5 mg Oral BID   Or  . metoprolol tartrate  12.5 mg Per Tube BID  . pantoprazole  40 mg Oral Daily   Continuous Infusions:  PRN Meds: acetaminophen **OR** acetaminophen (TYLENOL) oral liquid 160 mg/5 mL, ALPRAZolam, metoprolol tartrate, ondansetron (ZOFRAN) IV, oxyCODONE, traMADol   Vital Signs    Vitals:   08/12/20 2034 08/13/20 0006 08/13/20 0447 08/13/20 0712  BP:  (!) 98/58 93/67 107/71  Pulse: 89 77 80 84  Resp:  15 20 15   Temp:  (!) 97.5 F (36.4 C) 97.7 F (36.5 C) 97.6 F (36.4 C)  TempSrc:  Oral Oral Oral  SpO2:  95% 96% 95%  Weight:   93.5 kg   Height:        Intake/Output Summary (Last 24 hours) at 08/13/2020 1128 Last data filed at 08/12/2020 2036 Gross per 24 hour  Intake 3 ml  Output --  Net 3 ml   Filed Weights   08/11/20 0500 08/12/20 0300 08/13/20 0447  Weight: 90.4 kg 90 kg 93.5 kg    Physical Exam   GEN: Well nourished, well developed in no acute distress NECK: JVD above clavicle at 90 degrees CARDIAC: regular rhythm, normal S1 and S2, no rubs or gallops. No murmur. VASCULAR: Radial pulses 2+ bilaterally.  RESPIRATORY:  Clear to auscultation without rales, wheezing or rhonchi, diminished at bases ABDOMEN: Soft, non-tender, non-distended MUSCULOSKELETAL:  Moves all 4 limbs independently SKIN: Warm and dry, bilateral LE  edema with Unna Boots NEUROLOGIC:  No focal neuro deficits noted. PSYCHIATRIC:  Normal affect   Labs    Chemistry Recent Labs  Lab 08/10/20 1657 08/11/20 0355 08/12/20 0150  NA 132* 134* 132*  K 4.0 3.7 3.8  CL 102 104 102  CO2 24 27 26   GLUCOSE 176* 118* 141*  BUN 17 19 19   CREATININE 1.00 1.19 1.29*  CALCIUM 8.5* 8.2* 8.7*  GFRNONAA >60 >60 >60  ANIONGAP 6 3* 4*     Hematology Recent Labs  Lab 08/10/20 1657 08/11/20 0355 08/12/20 0150  WBC 9.8 7.9 9.8  RBC 3.10* 2.85* 3.02*  HGB 9.9* 9.4* 9.5*  HCT 28.8* 26.5* 28.7*  MCV 92.9 93.0 95.0  MCH 31.9 33.0 31.5  MCHC 34.4 35.5 33.1  RDW 14.2 14.0 14.3  PLT 141* 102* 125*    Cardiac Enzymes High Sensitivity Troponin:  No results for input(s): TROPONINIHS in the last 720 hours.      Lab Results  Component Value Date   CHOL 102 03/09/2020   HDL 39 (L) 03/09/2020   LDLCALC 45 03/09/2020   TRIG 90 03/09/2020   CHOLHDL 2.6 03/09/2020   Lab Results  Component Value Date   ALT 41 06/16/2020   AST 43 (H) 06/16/2020   ALKPHOS 68 06/16/2020   BILITOT  1.6 (H) 06/16/2020   Lab Results  Component Value Date   TSH 2.16 09/15/2014   Lab Results  Component Value Date   HGBA1C 5.4 08/09/2020    BNPNo results for input(s): BNP, PROBNP in the last 168 hours.   Cardiology Studies    ECHO: 08/02/2020 1. Left ventricular ejection fraction, by estimation, is 60 to 65%. The  left ventricle has normal function. The left ventricle has no regional  wall motion abnormalities. Left ventricular diastolic parameters were  normal.  2. Right ventricular systolic function is normal. The right ventricular  size is normal. Tricuspid regurgitation signal is inadequate for assessing  PA pressure.  3. The mitral valve is normal in structure. No evidence of mitral valve  regurgitation. No evidence of mitral stenosis.  4. The aortic valve is normal in structure. Aortic valve regurgitation is  not visualized. No aortic  stenosis is present.  5. The inferior vena cava is normal in size with greater than 50%  respiratory variability, suggesting right atrial pressure of 3 mmHg.   CARDIAC CATH: 08/02/2020  Prox LAD lesion is 95% stenosed.  Prox LAD to Mid LAD lesion is 80% stenosed.  1st Mrg lesion is 75% stenosed.  2nd Mrg lesion is 60% stenosed.  Prox RCA to Mid RCA lesion is 70% stenosed.  Previously placed Mid RCA-1 stent (unknown type) is widely patent.  Mid RCA-2 lesion is 50% stenosed.  The left ventricular systolic function is normal.  LV end diastolic pressure is mildly elevated.  The left ventricular ejection fraction is 55-65% by visual estimate.   1. 3 vessel obstructive CAD    - critical 95% proximal LAD    - 70% OM1    - 60% OM2    - 70% mid RCA proximal to prior stent with abnormal RFR 2. Normal LV function 3. Mildly elevated LVEDP  Plan: recommend CABG. Will hold Plavix. Admit to telemetry and treat with IV heparin. Check Echo.  Diagnostic Dominance: Right      Radiology    No results found.  Telemetry    Largely sinus rhythm, brief supraventricular/atrial tach yesterday- Personally Reviewed  ECG    08/10/20 SR- Personally Reviewed  Patient Profile     60 y.o. male with hx of CAD/NSTEMI s/p DES to [facility] with residual moderate LAD disease in 1027, chronic diastolic HF, HTN, HLD, CKD now with with cath for angina, associated SOB, Cath with significant CAD. S/P 4V CABG 08/09/20.  Assessment & Plan    CAD with NSTEMI Now s/p 4 V CABG by Dr. Kipp Brood on 08/09/20 (LIMA-LAD, SVG-PDA, SVG-OM1, SVG-OM2) -Given that he presented with NSTEMI, changing to aspirin 81 mg daily long term and clopidogrel 75 mg for one year prior  -continue atorvastatin 80 mg daily -has been restarted on beta blocker, now on metoprolol, was on carvedilol prior. BP low normal.  LE edema: -suspect component of venous insufficiency. -Unna boots in place today -restarting lasix  today  Post op atrial tachycardia: -very brief, within 48 hours of surgery. No anticoagulation or antiarrhythmic at this time.  Hypertension: - BP has been well controlled, only on metoprolol currently. Was on carvedilol and imdur at home  Chronic diastolic heart failure: -preop EF 60-65% -lasix PRN while admitted, was on standing daily lasix at home. Elevated JVD, will restart lasix today  Hypercholesterolemia: -prior to admission, was on atorvastatin 40 mg daily, but last LDL 02/2020 was 90. Goal <70 -increased atorvastatin to 80 mg daily this admission, recheck lipids at  follow up  CKD stage II: -Cr 1.29 today  CARDIOLOGY RECOMMENDATIONS:  Discharge is anticipated in the next 48 hours. Recommendations for medications and follow up:  Discharge Medications: Continue medications as they are currently listed in the [facility]. Exceptions to the above:  adding back daily lasix 40 mg today  Follow Up: The patient's Primary Cardiologist is Ena Dawley, MD  He is scheduled for follow up/transitions of care with 08/29/20 with Richardson Dopp  Signed,  Buford Dresser, MD  11:28 AM 08/13/2020  [facility]  Buford Dresser, MD, PhD, [place]  [address], [place], [place] [address] [phone]  Buford Dresser, MD 08/13/2020

## 2020-08-13 NOTE — Progress Notes (Addendum)
      [address]       [place] [address]             [phone]      4 Days Post-Op Procedure(s) (LRB): CORONARY ARTERY BYPASS GRAFTING (CABG) X  FOUR   USING  LEFT INTERNAL MAMMARY ARTERY HARVEST AND RIGHT AND LEFT ENDOSCOPIC SAPHENOUS VEIN HARVEST (N/A) TRANSESOPHAGEAL ECHOCARDIOGRAM (TEE) (N/A)   Subjective:  No new complaints.  Up and walking to the chair.  Continues to complain of his chest being sore.  + BM  Objective: Vital signs in last 24 hours: Temp:  [97.4 F (36.3 C)-98.1 F (36.7 C)] 97.6 F (36.4 C) (03/26 0712) Pulse Rate:  [77-89] 84 (03/26 0712) Cardiac Rhythm: Normal sinus rhythm (03/25 1900) Resp:  [15-20] 15 (03/26 0712) BP: (93-107)/(58-71) 107/71 (03/26 0712) SpO2:  [95 %-98 %] 95 % (03/26 0712) Weight:  [93.5 kg] 93.5 kg (03/26 0447)  Intake/Output from previous day: 03/25 0701 - 03/26 0700 In: 243 [P.O.:240; I.V.:3] Out: -   General appearance: alert, cooperative and no distress Heart: regular rate and rhythm Lungs: clear to auscultation bilaterally Abdomen: soft, non-tender; bowel sounds normal; no masses,  no organomegaly Extremities: edema trace Wound: clean and dry, una boots BLE  Lab Results: Recent Labs    08/11/20 0355 08/12/20 0150  WBC 7.9 9.8  HGB 9.4* 9.5*  HCT 26.5* 28.7*  PLT 102* 125*   BMET:  Recent Labs    08/11/20 0355 08/12/20 0150  NA 134* 132*  K 3.7 3.8  CL 104 102  CO2 27 26  GLUCOSE 118* 141*  BUN 19 19  CREATININE 1.19 1.29*  CALCIUM 8.2* 8.7*    PT/INR: No results for input(s): LABPROT, INR in the last 72 hours. ABG    Component Value Date/Time   PHART 7.332 (L) 08/09/2020 1808   HCO3 24.8 08/09/2020 1808   TCO2 26 08/09/2020 1808   ACIDBASEDEF 1.0 08/09/2020 1808   O2SAT 99.0 08/09/2020 1808   CBG (last 3)  Recent Labs    08/12/20 2033 08/13/20 0620 08/13/20 0717  GLUCAP 126* 55* 74    Assessment/Plan: S/P Procedure(s) (LRB): CORONARY ARTERY BYPASS GRAFTING  (CABG) X  FOUR   USING  LEFT INTERNAL MAMMARY ARTERY HARVEST AND RIGHT AND LEFT ENDOSCOPIC SAPHENOUS VEIN HARVEST (N/A) TRANSESOPHAGEAL ECHOCARDIOGRAM (TEE) (N/A)  1. CV- NSR, BP labile- continue Lopressor 12.5 mg BID 2. Pulm- no acute issues, off oxygen, continue IS 3. Renal- no significant edema on exam, no need for lasix at this time 4. CBGs have been controlled, patient is not a diabetic, will d/c SSIP and cbgs 5. Deconditioning- patient lives alone, awaiting PT evaluation, patient is ambulating with rolling walker, will see if he qualifies for SNF 6. Dispo- patient stable, remains in NSR, awaiting PT evaluation, una boots to be changed today and redress LE venous stasis ulcers, continue current care   LOS: 11 days    Ellwood Handler, PA-C 08/13/2020   Agree with above Ambulating well [place]

## 2020-08-13 NOTE — Evaluation (Signed)
Physical Therapy Evaluation Patient Details Name: Lonnie Skinner MRN: [id number] DOB: Feb 13, 1961 Today's Date: 08/13/2020   History of Present Illness  60 yo male with history of chest pain was admitted for CABG x 4 with heart cath on 3.15 and Cabg on 3.22.  Has recently had wife develop health issues, has increased SOB esp to walk the dog and go outdoors.  PMHx:  CAD, NSTEMI, CHF, CKD, DVT, HTN, angina, HLD, difficulty with word finding and getting history  Clinical Impression  Pt was seen for mobility on RW with first checking sats and HR, starting at 76 and sats at 96%.  After gait, during which vitals were difficult to read due to cold fingers and grip on walker, pt demonstrated 100% sat with pulse 79.  One short time with reading was 82 pulse during gait but sat read 64%, and not likely as fingers were blanched and cold.  Follow acutely for gait and balance skills, and recommend recovery to include outpatient PT and cardiac rehab.  Pt is struggling with word finding and history details, as well as slow motor output esp for more unfamiliar tasks such as standing with sternal precautions.    Follow Up Recommendations Home health PT;Supervision for mobility/OOB    Equipment Recommendations  Rolling walker with 5" wheels    Recommendations for Other Services       Precautions / Restrictions Precautions Precautions: Fall;Sternal Precaution Booklet Issued: No (verbally reviewed) Precaution Comments: monitor HR and sats Restrictions Weight Bearing Restrictions: Yes RUE Weight Bearing:  (minimize WB) LUE Weight Bearing:  (minimize WB)      Mobility  Bed Mobility               General bed mobility comments: up in chair when PT arrivede    Transfers Overall transfer level: Needs assistance Equipment used: Rolling walker (2 wheeled);1 person hand held assist Transfers: Sit to/from Stand Sit to Stand: Min assist;Min guard         General transfer comment: greater heights  are min guard, lesser are min assist  Ambulation/Gait Ambulation/Gait assistance: Min guard Gait Distance (Feet): 250 Feet Assistive device: Rolling walker (2 wheeled);1 person hand held assist Gait Pattern/deviations: Step-through pattern Gait velocity: controlled   General Gait Details: walking in a bit of a squat, but able to maneuver on walker to go around corners and avoid obstacles.  Stairs            Wheelchair Mobility    Modified Rankin (Stroke Patients Only)       Balance Overall balance assessment: Needs assistance Sitting-balance support: Feet supported Sitting balance-Leahy Scale: Good     Standing balance support: Bilateral upper extremity supported;During functional activity Standing balance-Leahy Scale: Fair Standing balance comment: min guard for control on walker                             Pertinent Vitals/Pain Pain Assessment: 0-10 Pain Score: 2  Pain Location: chest Pain Descriptors / Indicators: Operative site guarding    Home Living Family/patient expects to be discharged to:: Private residence Living Arrangements: Alone Available Help at Discharge: Family;Available 24 hours/day Type of Home: House Home Access: Stairs to enter Entrance Stairs-Rails: None (uses door for support) Entrance Stairs-Number of Steps: 1 Home Layout: One level Home Equipment: None Additional Comments: has been walking with no AD    Prior Function Level of Independence: Needs assistance   Gait / Transfers Assistance Needed: has  mod I gait with no AD previously  ADL's / Homemaking Assistance Needed: has nearby family to assist if needed  Comments: delayed processing     Hand Dominance   Dominant Hand: Right    Extremity/Trunk Assessment   Upper Extremity Assessment Upper Extremity Assessment: Overall WFL for tasks assessed    Lower Extremity Assessment Lower Extremity Assessment: Generalized weakness    Cervical / Trunk  Assessment Cervical / Trunk Assessment: Normal  Communication   Communication: Expressive difficulties;Other (comment) (delayed motor responses as well as verbal responses)  Cognition Arousal/Alertness: Awake/alert Behavior During Therapy: Flat affect Overall Cognitive Status: No family/caregiver present to determine baseline cognitive functioning                                 General Comments: unsure of his baseline motor and verbal output      General Comments General comments (skin integrity, edema, etc.): pt is walking on RW with minor cues and contact to maintain balance control but his primary issue is to stand up from the chair    Exercises     Assessment/Plan    PT Assessment Patient needs continued PT services  PT Problem List Decreased strength;Decreased activity tolerance;Decreased balance;Decreased mobility;Decreased knowledge of use of DME;Decreased safety awareness;Cardiopulmonary status limiting activity;Decreased skin integrity;Pain       PT Treatment Interventions DME instruction;Gait training;Stair training;Functional mobility training;Therapeutic activities;Therapeutic exercise;Balance training;Neuromuscular re-education;Patient/family education    PT Goals (Current goals can be found in the Care Plan section)  Acute Rehab PT Goals Patient Stated Goal: to go home PT Goal Formulation: With patient Time For Goal Achievement: 08/26/20 Potential to Achieve Goals: Good    Frequency Min 3X/week   Barriers to discharge Inaccessible home environment;Decreased caregiver support has one step and lives alone    Co-evaluation               AM-PAC PT "6 Clicks" Mobility  Outcome Measure Help needed turning from your back to your side while in a flat bed without using bedrails?: A Little Help needed moving from lying on your back to sitting on the side of a flat bed without using bedrails?: A Little Help needed moving to and from a bed to a  chair (including a wheelchair)?: A Little Help needed standing up from a chair using your arms (e.g., wheelchair or bedside chair)?: A Little Help needed to walk in hospital room?: A Little Help needed climbing 3-5 steps with a railing? : A Little 6 Click Score: 18    End of Session   Activity Tolerance: Patient limited by fatigue;Treatment limited secondary to medical complications (Comment) Patient left: in chair;with call bell/phone within reach;with chair alarm set Nurse Communication: Mobility status;Other (comment) (HR was 79-82 with movement) PT Visit Diagnosis: Unsteadiness on feet (R26.81);Muscle weakness (generalized) (M62.81);Pain Pain - Right/Left:  (chest) Pain - part of body:  (chest)    Time: 3428-7681 PT Time Calculation (min) (ACUTE ONLY): 39 min   Charges:   PT Evaluation $PT Eval Moderate Complexity: 1 Mod PT Treatments $Gait Training: 8-22 mins $Therapeutic Activity: 8-22 mins       Ramond Dial 08/13/2020, 1:58 PM Mee Hives, PT MS Acute Rehab Dept. Number: [place] and [place]

## 2020-08-13 NOTE — Progress Notes (Signed)
08/12/2020 2115 Pt ambulated 731ft with walker without difficulty.  Did stop one time.  Tolerated walk well. [place]

## 2020-08-13 NOTE — Progress Notes (Signed)
Mobility Specialist - Progress Note   08/13/20 1339  Mobility  Activity Ambulated in hall  Level of Assistance Standby assist, set-up cues, supervision of patient - no hands on  Assistive Device Front wheel walker  Distance Ambulated (ft) 790 ft  Mobility Response Tolerated well  Mobility performed by Mobility specialist  $Mobility charge 1 Mobility   Pt asx throughout ambulation. Pt to recliner after walk to eat lunch. VSS.   Pricilla Handler Mobility Specialist Mobility Specialist Phone: [phone]

## 2020-08-14 LAB — GLUCOSE, CAPILLARY: Glucose-Capillary: 113 mg/dL — ABNORMAL HIGH (ref 70–99)

## 2020-08-14 MED ORDER — TRAMADOL HCL 50 MG PO TABS: 50.0000 mg | ORAL_TABLET | ORAL | 0 refills | Status: DC | PRN

## 2020-08-14 MED ORDER — FUROSEMIDE 40 MG PO TABS: 40.0000 mg | ORAL_TABLET | Freq: Every day | ORAL | 1 refills | Status: DC

## 2020-08-14 MED ORDER — ATORVASTATIN CALCIUM 80 MG PO TABS: 80.0000 mg | ORAL_TABLET | Freq: Every evening | ORAL | 3 refills | Status: DC

## 2020-08-14 MED ORDER — CARVEDILOL 3.125 MG PO TABS: 3.1250 mg | ORAL_TABLET | Freq: Two times a day (BID) | ORAL | 3 refills | Status: DC

## 2020-08-14 MED ORDER — ACETAMINOPHEN 325 MG PO TABS: 650.0000 mg | ORAL_TABLET | Freq: Four times a day (QID) | ORAL | Status: DC | PRN

## 2020-08-14 NOTE — Discharge Instructions (Signed)
[facility] office number [id number]  Endoscopic Saphenous Vein Harvesting, Care After This sheet gives you information about how to care for yourself after your procedure. Your health care provider may also give you more specific instructions. If you have problems or questions, contact your health care provider. What can I expect after the procedure? After the procedure, it is common to have:  Pain.  Bruising.  Swelling.  Numbness. Follow these instructions at home: Incision care  Follow instructions from your health care provider about how to take care of your incisions. Make sure you: ? Wash your hands with soap and water before and after you change your bandages (dressings). If soap and water are not available, use hand sanitizer. ? Change your dressings as told by your health care provider. ? Leave stitches (sutures), skin glue, or adhesive strips in place. These skin closures may need to stay in place for 2 weeks or longer. If adhesive strip edges start to loosen and curl up, you may trim the loose edges. Do not remove adhesive strips completely unless your health care provider tells you to do that.  Check your incision areas every day for signs of infection. Check for: ? More redness, swelling, or pain. ? Fluid or blood. ? Warmth. ? Pus or a bad smell.   Medicines  Take over-the-counter and prescription medicines only as told by your health care provider.  Ask your health care provider if the medicine prescribed to you requires you to avoid driving or using heavy machinery. General instructions  Raise (elevate) your legs above the level of your heart while you are sitting or lying down.  Avoid crossing your legs.  Avoid sitting for long periods of time. Change positions every 30 minutes.  Do any exercises your health care providers have given you. These may include deep breathing, coughing, and walking exercises.  Do not take baths, swim, or use a hot tub until your  health care provider approves. Ask your health care provider if you may take showers. You may only be allowed to take sponge baths.  Wear compression stockings as told by your health care provider. These stockings help to prevent blood clots and reduce swelling in your legs.  Keep all follow-up visits as told by your health care provider. This is important. Contact a health care provider if:  Medicine does not help your pain.  Your pain gets worse.  You have new leg bruises or your leg bruises get bigger.  Your leg feels numb.  You have more redness, swelling, or pain around your incision.  You have fluid or blood coming from your incision.  Your incision feels warm to the touch.  You have pus or a bad smell coming from your incision.  You have a fever. Get help right away if:  Your pain is severe.  You develop pain, tenderness, warmth, redness, or swelling in any part of your leg.  You have chest pain.  You have trouble breathing. Summary  Raise (elevate) your legs above the level of your heart while you are sitting or lying down.  Wear compression stockings as told by your health care provider.  Make sure you know which symptoms should prompt you to contact your health care provider.  Keep all follow-up visits as told by your health care provider. This information is not intended to replace advice given to you by your health care provider. Make sure you discuss any questions you have with your health care provider. Document Revised:  04/14/2018 Document Reviewed: 04/14/2018 Elsevier Patient Education  [place]. Coronary Artery Bypass Grafting, Care After This sheet gives you information about how to care for yourself after your procedure. Your doctor may also give you more specific instructions. If you have problems or questions, call your doctor. What can I expect after the procedure? After the procedure, it is common to:  Feel sick to your stomach  (nauseous).  Not want to eat as much as normal (lack of appetite).  Have trouble pooping (constipation).  Have weakness and tiredness (fatigue).  Feel sad (depressed) or grouchy (irritable).  Have pain or discomfort around the cuts from surgery (incisions). Follow these instructions at home: Medicines  Take over-the-counter and prescription medicines only as told by your doctor. Do not stop taking medicines or start any new medicines unless your doctor says it is okay.  If you were prescribed an antibiotic medicine, take it as told by your doctor. Do not stop taking the antibiotic even if you start to feel better. Incision care  Follow instructions from your doctor about how to take care of your cuts from surgery. Make sure you: ? Wash your hands with soap and water before and after you change your bandage (dressing). If you cannot use soap and water, use hand sanitizer. ? Change your bandage as told by your doctor. ? Leave stitches (sutures), skin glue, or skin tape (adhesive) strips in place. They may need to stay in place for 2 weeks or longer. If tape strips get loose and curl up, you may trim the loose edges. Do not remove tape strips completely unless your doctor says it is okay.  Make sure the surgery cuts are clean, dry, and protected.  Check your cut areas every day for signs of infection. Check for: ? More redness, swelling, or pain. ? More fluid or blood. ? Warmth. ? Pus or a bad smell.  If cuts were made in your legs: ? Avoid crossing your legs. ? Avoid sitting for long periods of time. Change positions every 30 minutes. ? Raise (elevate) your legs when you are sitting.   Bathing  Do not take baths, swim, or use a hot tub until your doctor says it is okay.  You may shower Pat the surgery cuts dry. Do not rub the cuts to dry.  Eating and drinking  Eat foods that are high in fiber, such as beans, nuts, whole grains, and raw fruits and vegetables. Any meats you  eat should be lean cut. Avoid canned, processed, and fried foods. This can help prevent trouble pooping. This is also a part of a heart-healthy diet.  Drink enough fluid to keep your pee (urine) pale yellow.  Do not drink alcohol until you are fully recovered. Ask your doctor when it is safe to drink alcohol.   Activity  Rest and limit your activity as told by your doctor. You may be told to: ? Stop any activity right away if you have chest pain, shortness of breath, irregular heartbeats, or dizziness. Get help right away if you have any of these symptoms. ? Move around often for short periods or take short walks as told by your doctor. Slowly increase your activities. ? Avoid lifting, pushing, or pulling anything that is heavier than 10 lb (4.5 kg) for at least 6 weeks or as told by your doctor.  Do physical therapy or a cardiac rehab (cardiac rehabilitation) program as told by your doctor. ? Physical therapy involves doing exercises to maintain  movement and build strength and endurance. ? A cardiac rehab program includes:  Exercise training.  Education.  Counseling.  Do not drive until your doctor says it is okay.  Ask your doctor when you can go back to work.  Ask your doctor when you can be sexually active. General instructions  Do not drive or use heavy machinery while taking prescription pain medicine.  Do not use any products that contain nicotine or tobacco. These include cigarettes, e-cigarettes, and chewing tobacco. If you need help quitting, ask your doctor.  Take 2-3 deep breaths every few hours during the day while you get better. This helps expand your lungs and prevent problems.  If you were given a device called an incentive spirometer, use it several times a day to practice deep breathing. Support your chest with a pillow or your arms when you take deep breaths or cough.  Wear compression stockings as told by your doctor.  Weigh yourself every day. This helps  to see if your body is holding (retaining) fluid that may make your heart and lungs work harder.  Keep all follow-up visits as told by your doctor. This is important. Contact a doctor if:  You have more redness, swelling, or pain around any cut.  You have more fluid or blood coming from any cut.  Any cut feels warm to the touch.  You have pus or a bad smell coming from any cut.  You have a fever.  You have swelling in your ankles or legs.  You have pain in your legs.  You gain 2 lb (0.9 kg) or more a day.  You feel sick to your stomach or you throw up (vomit).  You have watery poop (diarrhea). Get help right away if:  You have chest pain that goes to your jaw or arms.  You are short of breath.  You have a fast or irregular heartbeat.  You notice a "clicking" in your breastbone (sternum) when you move.  You have any signs of a stroke. "BE FAST" is an easy way to remember the main warning signs: ? B - Balance. Signs are dizziness, sudden trouble walking, or loss of balance. ? E - Eyes. Signs are trouble seeing or a change in how you see. ? F - Face. Signs are sudden weakness or loss of feeling of the face, or the face or eyelid drooping on one side. ? A - Arms. Signs are weakness or loss of feeling in an arm. This happens suddenly and usually on one side of the body. ? S - Speech. Signs are sudden trouble speaking, slurred speech, or trouble understanding what people say. ? T - Time. Time to call emergency services. Write down what time symptoms started.  You have other signs of a stroke, such as: ? A sudden, very bad headache with no known cause. ? Feeling sick to your stomach. ? Throwing up. ? Jerky movements you cannot control (seizure). These symptoms may be an emergency. Do not wait to see if the symptoms will go away. Get medical help right away. Call your local emergency services (911 in the [place] Do not drive yourself to the hospital. Summary  After the  procedure, it is common to have pain or discomfort in the cuts from surgery (incisions).  Do not take baths, swim, or use a hot tub until your doctor says it is okay.  Slowly increase your activities. You may need physical therapy or cardiac rehab.  Weigh yourself every day. This  helps to see if your body is holding fluid. This information is not intended to replace advice given to you by your health care provider. Make sure you discuss any questions you have with your health care provider. Document Revised: 01/14/2018 Document Reviewed: 01/14/2018 Elsevier Patient Education  2021 [employer].

## 2020-08-14 NOTE — Progress Notes (Signed)
° °   °  [address]       [place] [address]             [phone]      5 Days Post-Op Procedure(s) (LRB): CORONARY ARTERY BYPASS GRAFTING (CABG) X  FOUR   USING  LEFT INTERNAL MAMMARY ARTERY HARVEST AND RIGHT AND LEFT ENDOSCOPIC SAPHENOUS VEIN HARVEST (N/A) TRANSESOPHAGEAL ECHOCARDIOGRAM (TEE) (N/A)   Subjective:  No new complaints.  Sitting up in chair, feeling pretty good.  Wants to go home.  + ambulation  + BM  Objective: Vital signs in last 24 hours: Temp:  [97.6 F (36.4 C)-98.4 F (36.9 C)] 97.6 F (36.4 C) (03/27 0748) Pulse Rate:  [76-95] 83 (03/27 0748) Cardiac Rhythm: Normal sinus rhythm (03/27 0917) Resp:  [18-24] 20 (03/27 0748) BP: (97-133)/(62-87) 122/87 (03/27 0748) SpO2:  [93 %-100 %] 98 % (03/27 0748) Weight:  [90.2 kg] 90.2 kg (03/27 0300)  Intake/Output from previous day: No intake/output data recorded. Intake/Output this shift: Total I/O In: 640 [P.O.:640] Out: -   General appearance: alert, cooperative and no distress Heart: regular rate and rhythm Lungs: clear to auscultation bilaterally Abdomen: soft, non-tender; bowel sounds normal; no masses,  no organomegaly Extremities: edema trace Wound: clean and dry sternotomy, EVH sites.. vascular ulcers stable  Lab Results: Recent Labs    08/12/20 0150  WBC 9.8  HGB 9.5*  HCT 28.7*  PLT 125*   BMET:  Recent Labs    08/12/20 0150  NA 132*  K 3.8  CL 102  CO2 26  GLUCOSE 141*  BUN 19  CREATININE 1.29*  CALCIUM 8.7*    PT/INR: No results for input(s): LABPROT, INR in the last 72 hours. ABG    Component Value Date/Time   PHART 7.332 (L) 08/09/2020 1808   HCO3 24.8 08/09/2020 1808   TCO2 26 08/09/2020 1808   ACIDBASEDEF 1.0 08/09/2020 1808   O2SAT 99.0 08/09/2020 1808   CBG (last 3)  Recent Labs    08/13/20 1609 08/13/20 2115 08/14/20 0611  GLUCAP 80 94 113*    Assessment/Plan: S/P Procedure(s) (LRB): CORONARY ARTERY BYPASS GRAFTING (CABG) X  FOUR    USING  LEFT INTERNAL MAMMARY ARTERY HARVEST AND RIGHT AND LEFT ENDOSCOPIC SAPHENOUS VEIN HARVEST (N/A) TRANSESOPHAGEAL ECHOCARDIOGRAM (TEE) (N/A)  1. CV- NSR, BP stable- will transition back to Coreg at discharge 2. Pulm- no acute issues, off oxygen continue IS 3. Renal- creatinine WNL, weight is stable.. restarted on lasix yesterday which he took prior to surgery 4. Deconditioning- PT recs H/H, however patient doesn't think he needs it, also has a walker at home 5. dispo- patient stable, will d/c home today   LOS: 12 days    Ellwood Handler, PA-C 08/14/2020

## 2020-08-14 NOTE — TOC Transition Note (Signed)
Transition of Care (TOC) - CM/SW Discharge Note Marvetta Gibbons RN, BSN Transitions of Care Unit 4E- RN Case Manager See Treatment Team for direct phone #    Patient Details  Name: Lonnie Skinner MRN: [id number] Date of Birth: [birth year]  Transition [facility]) CM/SW Contact:  Dawayne Patricia, RN Phone Number: 08/14/2020, 10:10 AM   Clinical Narrative:    Pt stable for transition home today, noted order for HHPT- then order was canceled. Per bedside RN Lattie Haw, pt has declined [facility] services and reports that he has RW at home. Family to assist post discharge. No further TOC needs noted.  Pt states he will have transportation home as well.     Final next level of care: Home/Self Care Barriers to Discharge: No Barriers Identified   Patient Goals and CMS Choice Patient states their goals for this hospitalization and ongoing recovery are:: return home   Choice offered to / list presented to : NA  Discharge Placement                 Home      Discharge Plan and Services   Discharge Planning Services: CM Consult Post Acute Care Choice: NA          DME Arranged: N/A DME Agency: NA       HH Arranged: PT,Patient Refused [facility] Agency: NA        Social Determinants of Health (SDOH) Interventions     Readmission Risk Interventions Readmission Risk Prevention Plan 08/14/2020  Transportation Screening Complete  PCP or Specialist Appt within 5-7 Days Complete  Home Care Screening Complete  Medication Review (RN CM) Complete  Some recent data might be hidden

## 2020-08-14 NOTE — Progress Notes (Signed)
Discharge instructions provided to patient, medications reviewed, questions answered.  Removed UNA boots from his legs and placed xeroform gauze and wrapped with kerlex. Educated pt on how to perform dressing changes every 48 hours and supplies provided.  He will follow up with [facility] for future care for the wounds.

## 2020-08-15 NOTE — Telephone Encounter (Signed)
**Note De-Identified Lonnie Skinner Obfuscation** Patient contacted regarding discharge from [facility] on 08/14/2020.  Patient understands to follow up with Richardson Dopp, PA-c on 08/29/2020 at 8:45 at [address] [place], [place] [address]. Patient understands discharge instructions? Yes Patient understands medications and regiment? Yes Patient understands to bring all medications to this visit? Yes  Ask patient:  Are you enrolled in My Chart: Yes  Postop Surgical Patients:                What is your wound status? "Good" Any signs/ symptoms of infection (Temp, redness/ red streaks, swelling, purulent drainage, foul odor or smell)? No  .             Please do not place any creams/ lotions/ or antibiotic ointment on any surgical incisions/ wounds without physician approval. The pt is advised and he verbalizes understanding.  .             Do you have any questions about your medications? No                                                      All medications (except pain medications) are to be filled by your Cardiologist AFTER your first post op appointment with them.  The patient is aware.                                                                              Are you taking your pain medication? No, he states that [facility] has not notified him that his medications are ready to be picked up yet.  I did advise him to call [facility] to request they fill his meds if they have not and to have his wife, son, or daughter pick them up asap. He states that he will as soon as we ended our call. I called and left a message on [facility] VM asking that they fill and to notify pt asap when his medications are ready for pick up. Marland Kitchen             How is your pain controlled? "Ok, but Im sore". Pain level? Pt only states that he is sore but other wise he is ok. He has not taken anything for pain since his discharge yesterday.  .             If you require a refill on pain medications, know that the same medication/ amount  may not be prescribed or a refill may not be given.  Please contact your pharmacy for refill requests. The pt is aware. .             Do you have help at home with ADL's?  Yes              Please refer to your Pre/post surgery booklet, there is a lot of useful information in it that may answer any questions you may have. The pt is aware. .             Please note **Note De-Identified Shakiyla Kook Obfuscation** that it is ok to remove your surgical dressing, shower (soap/ water), and pat the incision dry. The pt is aware.  For surgery related questions staff will route a phone note to CV DIV [facility] pool  [facility] [address] [place], [place] [address]

## 2020-08-17 ENCOUNTER — Telehealth (HOSPITAL_COMMUNITY): Payer: Self-pay

## 2020-08-17 NOTE — Telephone Encounter (Signed)
Called patient to see if he is interested in the Cardiac Rehab Program. Patient expressed interest. Explained scheduling process and went over insurance, patient verbalized understanding. Will contact patient for scheduling once f/u has been completed.  °

## 2020-08-17 NOTE — Telephone Encounter (Signed)
Pt insurance is active and benefits verified through [facility] Medicare. Co-pay $0.00, DED $0.00/$0.00 met, out of pocket $3,600.00/$720.00 met, co-insurance 0%. No pre-authorization required. Passport, 08/17/20 @ 12:07PM, [id number]  Will contact patient to see if he is interested in the Cardiac Rehab Program. If interested, patient will need to complete follow up appt. Once completed, patient will be contacted for scheduling upon review by the RN Navigator.

## 2020-08-19 ENCOUNTER — Ambulatory Visit (INDEPENDENT_AMBULATORY_CARE_PROVIDER_SITE_OTHER): Payer: Self-pay | Admitting: Thoracic Surgery (Cardiothoracic Vascular Surgery)

## 2020-08-19 ENCOUNTER — Encounter: Payer: Self-pay | Admitting: Thoracic Surgery (Cardiothoracic Vascular Surgery)

## 2020-08-19 ENCOUNTER — Other Ambulatory Visit: Payer: Self-pay

## 2020-08-19 VITALS — BP 140/90 | HR 92 | Resp 20 | Ht 68.0 in | Wt 192.0 lb

## 2020-08-19 DIAGNOSIS — Z951 Presence of aortocoronary bypass graft: Secondary | ICD-10-CM

## 2020-08-19 NOTE — Progress Notes (Signed)
      [address]       [place] [address]             [place] Record #[id number] Date of Birth: 1960/10/17  Referring: Dorothy Spark, MD Primary Care: Cleophas Dunker, DO Primary Cardiologist:Katarina [name], MD  Reason for visit:   follow-up  History of Present Illness:     Mr. Mossa comes in for his 1 week follow-up appointment.  Overall he is doing well.  He continues to use his walker at home.  He does complain of some right knee pain.  Physical Exam: BP 140/90   Pulse 92   Resp 20   Ht 5\' 8"  (1.727 m)   Wt 192 lb (87.1 kg)   SpO2 95% Comment: RA  BMI 29.19 kg/m   Alert NAD Incision clean.  Sternum stable Abdomen soft, ND Trace peripheral edema      Assessment / Plan:   60 year old male status post CABG.  Doing well. Follow-up in 1 month with a chest x-ray.   [name] 08/19/2020 12:28 PM

## 2020-08-28 NOTE — Progress Notes (Signed)
Cardiology Office Note:    Date:  08/29/2020   ID:  Lonnie Skinner, DOB June 03, 1960, MRN [id number]  PCP:  Cleophas Dunker, [place]  Cardiologist:  Freada Bergeron, MD   Electrophysiologist:  None       Referring MD: Cleophas Dunker, *   Chief Complaint:  Hospitalization Follow-up (S/p CABG)    Patient Profile:     Lonnie Skinner is a 60 y.o. male with:   Coronary artery disease   NSTEMI in 2015 s/p DES to mRCA   S/p CABG 07/2020 (L-LAD, S-PDA, S-OM1, S-OM2)  (HFpEF) heart failure with preserved ejection fraction   Hypertension   Hyperlipidemia   Chronic kidney disease   Hx of DVT in 1981   Venous insufficiency   Prior CV studies: LEFT HEART CATH 08/02/2020 1. 3 vessel obstructive CAD - critical 95% proximal LAD - 70% OM1 - 60% OM2 - 70% mid RCA proximal to prior stent with abnormal RFR 2. Normal LV function 3. Mildly elevated LVEDP Plan: recommend CABG. Will hold Plavix. Admit to telemetry and treat with IV heparin. Check Echo.  Echocardiogram 08/12/20 EF 60-65, no RWMA  Pre-CABG Dopplers 08/08/20 Minimal carotid plaque, no sig stenosis     History of Present Illness:    Mr. Lipinski was seen by Dr. [name] in 3/22.  He was having symptoms of exertional angina and he was set up for cardiac catheterization.  This demonstrated severe 3 v CAD with critical pLAD stenosis.  He was admitted 3/15-3/17 and underwent CABG with Dr. Kipp Brood.  His post op course was fairly uneventful.  He had brief runs of AFib but did not require amiodarone.  He returns for f/u.  He is here today with his daughter.  His chest remains sore.  It seems to be improving.  His appetite is okay.  He has had some difficulty with bowel movements.  He has been walking without significant shortness of breath.  He has a history of lower extremity swelling.  He has venous stasis ulcers which are managed by vascular surgery.  He currently has wraps on  both legs.  He has been sleeping in the recliner.        Past Medical History:  Diagnosis Date  . CAD (coronary artery disease)    a. 05/2013: NSTEMI s/p DES to mRCA (culprit vessel), residual moderate LAD disease (the patient fails medical therapy and this was found to be significant, this would be amenable to PCI).  . CHF (congestive heart failure) ([place])   . CKD (chronic kidney disease), stage II   . DVT (deep venous thrombosis) ([place]) 1981  . Hypercholesteremia   . Hyperglycemia   . Hypertension     Current Medications: Current Meds  Medication Sig  . acetaminophen (TYLENOL) 325 MG tablet Take 2 tablets (650 mg total) by mouth every 6 (six) hours as needed for mild pain, headache or fever.  . ALPRAZolam (XANAX) 0.25 MG tablet TAKE 1 TABLET(0.25 MG) BY MOUTH TWICE DAILY AS NEEDED FOR ANXIETY  . aspirin 81 MG EC tablet Take 1 tablet (81 mg total) by mouth daily.  Marland Kitchen atorvastatin (LIPITOR) 80 MG tablet Take 1 tablet (80 mg total) by mouth every evening.  . carvedilol (COREG) 3.125 MG tablet Take 1 tablet (3.125 mg total) by mouth 2 (two) times daily.  . clopidogrel (PLAVIX) 75 MG tablet Take 1 tablet (75 mg total) by mouth daily.  . furosemide (LASIX) 40 MG tablet  Take 1 tablet (40 mg total) by mouth daily.  . nitroGLYCERIN (NITROSTAT) 0.4 MG SL tablet Place 1 tablet (0.4 mg total) under the tongue every 5 (five) minutes as needed for chest pain (up to 3 doses).  . traMADol (ULTRAM) 50 MG tablet Take 1 tablet (50 mg total) by mouth every 4 (four) hours as needed for moderate pain.     Allergies:   Penicillins   Social History   Tobacco Use  . Smoking status: Former Research scientist (life sciences)  . Smokeless tobacco: Never Used  . Tobacco comment: Quit ~2007  Vaping Use  . Vaping Use: Never used  Substance Use Topics  . Alcohol use: No  . Drug use: No     Family Hx: The patient's family history includes CAD in an other family member; Cancer in his father; Heart attack in his brother and mother;  Heart disease in his brother and mother; Heart failure in his mother; Stroke in his mother.  Review of Systems  Gastrointestinal: Positive for constipation.     EKGs/Labs/Other Test Reviewed:    EKG:  EKG is   ordered today.  The ekg ordered today demonstrates NSR, HR 90, normal axis, nonspecific interventricular conduction delay, nonspecific ST-T wave changes, QTC 484  Recent Labs: 06/16/2020: ALT 41 08/10/2020: Magnesium 2.4 08/12/2020: BUN 19; Creatinine, Ser 1.29; Hemoglobin 9.5; Platelets 125; Potassium 3.8; Sodium 132   Recent Lipid Panel Lab Results  Component Value Date/Time   CHOL 102 03/09/2020 02:59 PM   TRIG 90 03/09/2020 02:59 PM   HDL 39 (L) 03/09/2020 02:59 PM   CHOLHDL 2.6 03/09/2020 02:59 PM   CHOLHDL 3.1 02/13/2016 03:40 PM   LDLCALC 45 03/09/2020 02:59 PM      Risk Assessment/Calculations:      Physical Exam:    VS:  BP 102/80   Pulse 90   Ht 5\' 8"  (1.727 m)   Wt 188 lb 6.4 oz (85.5 kg)   SpO2 98%   BMI 28.65 kg/m     Wt Readings from Last 3 Encounters:  08/29/20 188 lb 6.4 oz (85.5 kg)  08/19/20 192 lb (87.1 kg)  08/14/20 198 lb 12.8 oz (90.2 kg)     Constitutional:      Appearance: Healthy appearance. Not in distress.  Neck:     Vascular: No JVR. JVD normal.  Pulmonary:     Breath sounds: No wheezing. No rales.  Cardiovascular:     Normal rate. Regular rhythm. Normal S1. Normal S2.     Murmurs: There is no murmur.  Edema:    Ankle: bilateral trace edema of the ankle.    Comments: Lower ext wraps in place Abdominal:     Palpations: Abdomen is soft. There is no hepatomegaly.  Skin:    General: Skin is warm and dry.  Neurological:     General: No focal deficit present.     Mental Status: Alert and oriented to person, place and time.     Cranial Nerves: Cranial nerves are intact.         ASSESSMENT & PLAN:    1. Coronary artery disease involving native coronary artery of native heart without angina pectoris Prior history of  non-STEMI treated with DES to the mid RCA in 2015.  He is now status post CABG last month with Dr. Kipp Brood.  He seems to be progressing well.  I have encouraged him to pursue cardiac rehabilitation.  I think he is okay to start that from a cardiology standpoint.  He sees  Dr. Kipp Brood again next month.  I have encouraged him to use Ensure to replace calories if his meal intake is low.  I have also encouraged him to increase fiber in his diet to help with bowel movements.  Continue current dose of aspirin, clopidogrel, atorvastatin, carvedilol.  Follow-up with Dr. [name] in 3 months.  2. Essential hypertension, benign The patient's blood pressure is controlled on his current regimen.  Continue current therapy.   3. Mixed hyperlipidemia LDL optimal on most recent lab work.  Continue current Rx.    4. CKD (chronic kidney disease) stage 2, GFR 60-89 ml/min Creatinine has remained stable.         Dispo:  Return in about 3 months (around 11/28/2020) for Routine Follow Up, w/ Dr. [name].   Medication Adjustments/Labs and Tests Ordered: Current medicines are reviewed at length with the patient today.  Concerns regarding medicines are outlined above.  Tests Ordered: Orders Placed This Encounter  Procedures  . EKG 12-Lead   Medication Changes: No orders of the defined types were placed in this encounter.   Signed, Richardson Dopp, PA-C  08/29/2020 9:29 AM    [facility] [address], [place], [place]  [address] Phone: [phone]; Fax: [phone]

## 2020-08-29 ENCOUNTER — Other Ambulatory Visit: Payer: Self-pay

## 2020-08-29 ENCOUNTER — Ambulatory Visit (INDEPENDENT_AMBULATORY_CARE_PROVIDER_SITE_OTHER): Payer: Medicare Other | Admitting: Physician Assistant

## 2020-08-29 ENCOUNTER — Encounter: Payer: Self-pay | Admitting: Physician Assistant

## 2020-08-29 VITALS — BP 102/80 | HR 90 | Ht 68.0 in | Wt 188.4 lb

## 2020-08-29 DIAGNOSIS — I10 Essential (primary) hypertension: Secondary | ICD-10-CM | POA: Diagnosis not present

## 2020-08-29 DIAGNOSIS — I2510 Atherosclerotic heart disease of native coronary artery without angina pectoris: Secondary | ICD-10-CM

## 2020-08-29 DIAGNOSIS — E782 Mixed hyperlipidemia: Secondary | ICD-10-CM | POA: Diagnosis not present

## 2020-08-29 DIAGNOSIS — I872 Venous insufficiency (chronic) (peripheral): Secondary | ICD-10-CM

## 2020-08-29 DIAGNOSIS — N182 Chronic kidney disease, stage 2 (mild): Secondary | ICD-10-CM

## 2020-08-29 NOTE — Patient Instructions (Signed)
Medication Instructions:  Your physician recommends that you continue on your current medications as directed. Please refer to the Current Medication list given to you today.  *If you need a refill on your cardiac medications before your next appointment, please call your pharmacy*   Lab Work: -None  If you have labs (blood work) drawn today and your tests are completely normal, you will receive your results only by: Marland Kitchen MyChart Message (if you have MyChart) OR . A paper copy in the mail If you have any lab test that is abnormal or we need to change your treatment, we will call you to review the results.   Testing/Procedures: -None   Follow-Up: At [facility], you and your health needs are our priority.  As part of our continuing mission to provide you with exceptional heart care, we have created designated Provider Care Teams.  These Care Teams include your primary Cardiologist (physician) and Advanced Practice Providers (APPs -  Physician Assistants and Nurse Practitioners) who all work together to provide you with the care you need, when you need it.  We recommend signing up for the patient portal called "MyChart".  Sign up information is provided on this After Visit Summary.  MyChart is used to connect with patients for Virtual Visits (Telemedicine).  Patients are able to view lab/test results, encounter notes, upcoming appointments, etc.  Non-urgent messages can be sent to your provider as well.   To learn more about what you can do with MyChart, go to [email or web address].    Your next appointment:   3 month(s)  The format for your next appointment:   In Person  Provider:   Gwyndolyn Kaufman, MD   Other Instructions -None

## 2020-09-14 ENCOUNTER — Other Ambulatory Visit: Payer: Self-pay | Admitting: Family Medicine

## 2020-09-14 ENCOUNTER — Other Ambulatory Visit: Payer: Self-pay

## 2020-09-14 ENCOUNTER — Encounter: Payer: Self-pay | Admitting: Family Medicine

## 2020-09-14 ENCOUNTER — Ambulatory Visit (INDEPENDENT_AMBULATORY_CARE_PROVIDER_SITE_OTHER): Payer: Medicare Other | Admitting: Family Medicine

## 2020-09-14 VITALS — BP 120/66 | HR 85 | Ht 68.0 in | Wt 193.0 lb

## 2020-09-14 DIAGNOSIS — I25118 Atherosclerotic heart disease of native coronary artery with other forms of angina pectoris: Secondary | ICD-10-CM | POA: Diagnosis not present

## 2020-09-14 DIAGNOSIS — Z711 Person with feared health complaint in whom no diagnosis is made: Secondary | ICD-10-CM

## 2020-09-14 DIAGNOSIS — D696 Thrombocytopenia, unspecified: Secondary | ICD-10-CM

## 2020-09-14 DIAGNOSIS — Z951 Presence of aortocoronary bypass graft: Secondary | ICD-10-CM

## 2020-09-14 DIAGNOSIS — I10 Essential (primary) hypertension: Secondary | ICD-10-CM | POA: Diagnosis not present

## 2020-09-14 DIAGNOSIS — N182 Chronic kidney disease, stage 2 (mild): Secondary | ICD-10-CM | POA: Diagnosis not present

## 2020-09-14 NOTE — Patient Instructions (Signed)
My concerns: I do think that we have to do anything else for her eyes right now.  There does not seem to be any evidence of anything bad going on in your brain.  We also have recent imaging from January that shows that everything is normal.  I will get a release of information to see what the eye doctor saw on her last exam.  Going to follow-up on some blood work today and let you know if there are any abnormal results.  I would like you to come back to clinic in 1 month to make sure that we are following up on her chronic issues.

## 2020-09-14 NOTE — Progress Notes (Signed)
    SUBJECTIVE:   CHIEF COMPLAINT / HPI:   Eye Concerns Lonnie Skinner reports that he has been receiving much of his medical care at the [facility] in the past years though he sometimes gets medical care outside of the [facility] when it is convenient.  He was seen at the [facility] last week by an eye doctor who performed an eye exam and told him that his eyes show evidence of elevated blood pressure.  At that time, he was told that his blood pressure was normal.  He is not sure of any more detail than this.  Today, he specifically denies any new vision concerns, hearing trouble, headaches, neck pain, numbness, tingling, new weakness.   PERTINENT  PMH / PSH: History of CABG x4 on 08/09/2020,, CHF, CAD, CKD  OBJECTIVE:   BP 120/66   Pulse 85   Ht 5\' 8"  (1.727 m)   Wt 193 lb (87.5 kg)   SpO2 98%   BMI 29.35 kg/m    General: Seated comfortably in his chair in the exam room.  No acute distress. HEENT:PERRL, EOMI, his membranes.  Poor dentition.  Notable cerumen in the external canals bilaterally. Neuro: Cranial nerves II-XII intact Respiratory: Breathing comfortably on room air. Skin: Warm, dry  ASSESSMENT/PLAN:   Concern about eye disease without diagnosis Without the record of the exam from the eye doctor, it is a little bit difficult to know what the eye doctor was most concerned about.  There may simply be evidence long-term elevated blood pressure.  Alternatively, it is possible they were concerned about a more acute pathology with papilledema.  Based on his current presentation and blood pressure, I have very low suspicion for dangerous acute pathology.  He has no focal neurological findings today and his blood pressure is well controlled.  Additionally, he has an MRI from January of this year with no significant findings.  For now, we will simply get a release of information to follow-up on his eye findings.  Essential hypertension, benign Well-controlled today.  No medication changes needed at this time.    Lab abnormalities Chart review shows evidence of recent anemia, thrombocytopenia and kidney injury.  These are likely related to his recent CABG.  I will follow-up on these results and have already informed him that I would like him to be seen back in clinic in the next month.  Lonnie Haymaker, MD [name]

## 2020-09-14 NOTE — Assessment & Plan Note (Signed)
Without the record of the exam from the eye doctor, it is a little bit difficult to know what the eye doctor was most concerned about.  There may simply be evidence long-term elevated blood pressure.  Alternatively, it is possible they were concerned about a more acute pathology with papilledema.  Based on his current presentation and blood pressure, I have very low suspicion for dangerous acute pathology.  He has no focal neurological findings today and his blood pressure is well controlled.  Additionally, he has an MRI from January of this year with no significant findings.  For now, we will simply get a release of information to follow-up on his eye findings.

## 2020-09-14 NOTE — Assessment & Plan Note (Signed)
" >>  ASSESSMENT AND PLAN FOR ESSENTIAL HYPERTENSION, BENIGN WRITTEN ON 09/14/2020  1:04 PM BY DEMPSEY COY, MD  Well-controlled today.  No medication changes needed at this time. "

## 2020-09-14 NOTE — Assessment & Plan Note (Signed)
Well-controlled today.  No medication changes needed at this time.

## 2020-09-15 ENCOUNTER — Other Ambulatory Visit: Payer: Self-pay | Admitting: Family Medicine

## 2020-09-15 LAB — CBC
Hematocrit: 38.8 % (ref 37.5–51.0)
Hemoglobin: 13.3 g/dL (ref 13.0–17.7)
MCH: 30.1 pg (ref 26.6–33.0)
MCHC: 34.3 g/dL (ref 31.5–35.7)
MCV: 88 fL (ref 79–97)
Platelets: 284 10*3/uL (ref 150–450)
RBC: 4.42 x10E6/uL (ref 4.14–5.80)
RDW: 12.8 % (ref 11.6–15.4)
WBC: 9.4 10*3/uL (ref 3.4–10.8)

## 2020-09-15 LAB — COMPREHENSIVE METABOLIC PANEL
ALT: 17 IU/L (ref 0–44)
AST: 19 IU/L (ref 0–40)
Albumin/Globulin Ratio: 1.3 (ref 1.2–2.2)
Albumin: 3.9 g/dL (ref 3.8–4.9)
Alkaline Phosphatase: 143 IU/L — ABNORMAL HIGH (ref 44–121)
BUN/Creatinine Ratio: 18 (ref 10–24)
BUN: 22 mg/dL (ref 8–27)
Bilirubin Total: 0.5 mg/dL (ref 0.0–1.2)
CO2: 26 mmol/L (ref 20–29)
Calcium: 9.6 mg/dL (ref 8.6–10.2)
Chloride: 97 mmol/L (ref 96–106)
Creatinine, Ser: 1.22 mg/dL (ref 0.76–1.27)
Globulin, Total: 3 g/dL (ref 1.5–4.5)
Glucose: 101 mg/dL — ABNORMAL HIGH (ref 65–99)
Potassium: 4.1 mmol/L (ref 3.5–5.2)
Sodium: 137 mmol/L (ref 134–144)
Total Protein: 6.9 g/dL (ref 6.0–8.5)
eGFR: 68 mL/min/{1.73_m2} (ref >59)

## 2020-09-15 LAB — RETICULOCYTES: Retic Ct Pct: 1.7 % (ref 0.6–2.6)

## 2020-09-20 ENCOUNTER — Ambulatory Visit (INDEPENDENT_AMBULATORY_CARE_PROVIDER_SITE_OTHER): Payer: Medicare Other | Admitting: Physician Assistant

## 2020-09-20 ENCOUNTER — Other Ambulatory Visit: Payer: Self-pay

## 2020-09-20 ENCOUNTER — Ambulatory Visit (HOSPITAL_COMMUNITY)
Admission: RE | Admit: 2020-09-20 | Discharge: 2020-09-20 | Disposition: A | Discharge status: Home / Self Care | Payer: Medicare Other | Source: Ambulatory Visit | Attending: Vascular Surgery | Admitting: Vascular Surgery

## 2020-09-20 VITALS — BP 123/92 | HR 90 | Temp 98.5°F | Resp 20 | Ht 68.0 in | Wt 194.2 lb

## 2020-09-20 DIAGNOSIS — M549 Dorsalgia, unspecified: Secondary | ICD-10-CM | POA: Insufficient documentation

## 2020-09-20 DIAGNOSIS — M25569 Pain in unspecified knee: Secondary | ICD-10-CM | POA: Insufficient documentation

## 2020-09-20 DIAGNOSIS — H35033 Hypertensive retinopathy, bilateral: Secondary | ICD-10-CM | POA: Insufficient documentation

## 2020-09-20 DIAGNOSIS — R269 Unspecified abnormalities of gait and mobility: Secondary | ICD-10-CM | POA: Insufficient documentation

## 2020-09-20 DIAGNOSIS — F419 Anxiety disorder, unspecified: Secondary | ICD-10-CM | POA: Insufficient documentation

## 2020-09-20 DIAGNOSIS — M6289 Other specified disorders of muscle: Secondary | ICD-10-CM | POA: Insufficient documentation

## 2020-09-20 DIAGNOSIS — Z7901 Long term (current) use of anticoagulants: Secondary | ICD-10-CM | POA: Insufficient documentation

## 2020-09-20 DIAGNOSIS — I10 Essential (primary) hypertension: Secondary | ICD-10-CM | POA: Insufficient documentation

## 2020-09-20 DIAGNOSIS — I872 Venous insufficiency (chronic) (peripheral): Secondary | ICD-10-CM

## 2020-09-20 DIAGNOSIS — G8929 Other chronic pain: Secondary | ICD-10-CM | POA: Insufficient documentation

## 2020-09-20 LAB — PATHOLOGIST SMEAR REVIEW
Basophils Absolute: 0.1 10*3/uL (ref 0.0–0.2)
Basos: 1 %
EOS (ABSOLUTE): 0.2 10*3/uL (ref 0.0–0.4)
Eos: 2 %
Hematocrit: 39.9 % (ref 37.5–51.0)
Hemoglobin: 13.6 g/dL (ref 13.0–17.7)
Immature Grans (Abs): 0 10*3/uL (ref 0.0–0.1)
Immature Granulocytes: 0 %
Lymphocytes Absolute: 1.7 10*3/uL (ref 0.7–3.1)
Lymphs: 18 %
MCH: 30.6 pg (ref 26.6–33.0)
MCHC: 34.1 g/dL (ref 31.5–35.7)
MCV: 90 fL (ref 79–97)
Monocytes Absolute: 0.7 10*3/uL (ref 0.1–0.9)
Monocytes: 8 %
Neutrophils Absolute: 6.8 10*3/uL (ref 1.4–7.0)
Neutrophils: 71 %
Platelets: 280 10*3/uL (ref 150–450)
RBC: 4.44 x10E6/uL (ref 4.14–5.80)
RDW: 13 % (ref 11.6–15.4)
WBC: 9.4 10*3/uL (ref 3.4–10.8)

## 2020-09-20 NOTE — Progress Notes (Signed)
Office Note     CC:  follow up Requesting Provider:  Cleophas Dunker, *  HPI: Lonnie Skinner is a 60 y.o. (1961-01-13) male who presents for outpatient follow-up of venous stasis ulcerations of bilateral lower extremities.  He was seen in consult in the hospital postoperatively after coronary artery bypass grafting to evaluate venous stasis ulcerations of bilateral lower extremities.  Bilateral greater saphenous veins were harvested for conduit use.  The patient is here with his daughter today who states she continued to cleanse and wrap his legs since leaving the hospital.  She states the ulcerations are all healed.  They also noticed less edema of bilateral lower extremities.  He denies any claudication or rest pain of bilateral lower extremities.  Past Medical History:  Diagnosis Date  . CAD (coronary artery disease)    a. 05/2013: NSTEMI s/p DES to mRCA (culprit vessel), residual moderate LAD disease (the patient fails medical therapy and this was found to be significant, this would be amenable to PCI).  . CHF (congestive heart failure) ([place])   . CKD (chronic kidney disease), stage II   . DVT (deep venous thrombosis) ([place]) 1981  . Hypercholesteremia   . Hyperglycemia   . Hypertension     Past Surgical History:  Procedure Laterality Date  . CORONARY ARTERY BYPASS GRAFT N/A 08/09/2020   Procedure: CORONARY ARTERY BYPASS GRAFTING (CABG) X  FOUR   USING  LEFT INTERNAL MAMMARY ARTERY HARVEST AND RIGHT AND LEFT ENDOSCOPIC SAPHENOUS VEIN HARVEST;  Surgeon: [name], MD;  Location: [facility];  Service: Open Heart Surgery;  Laterality: N/A;  . CORONARY STENT PLACEMENT  05/2013   mRCA  . CORONARY/GRAFT ACUTE MI REVASCULARIZATION  2015  . INTRAVASCULAR PRESSURE WIRE/FFR STUDY N/A 08/02/2020   Procedure: INTRAVASCULAR PRESSURE WIRE/FFR STUDY;  Surgeon: [name], [name], MD;  Location: [facility] CV LAB;  Service: Cardiovascular;  Laterality: N/A;  . KNEE SURGERY Left 2009  . LEFT  HEART CATH AND CORONARY ANGIOGRAPHY N/A 08/02/2020   Procedure: LEFT HEART CATH AND CORONARY ANGIOGRAPHY;  Surgeon: [name], [name], MD;  Location: [facility] CV [facility];  Service: Cardiovascular;  Laterality: N/A;  . LEFT HEART CATHETERIZATION WITH CORONARY ANGIOGRAM N/A 06/02/2013   Procedure: LEFT HEART CATHETERIZATION WITH CORONARY ANGIOGRAM;  Surgeon: [name], MD;  [facility] C[facility] LAB;  Service: Cardiovascular;  Laterality: N/A;  . PERCUTANEOUS CORONARY STENT INTERVENTION (PCI-S)  06/02/2013   Procedure: PERCUTANEOUS CORONARY STENT INTERVENTION (PCI-S);  Surgeon: [name], MD;  Location: [facility] CATH LAB;  Service: Cardiovascular;;  . TEE WITHOUT CARDIOVERSION N/A 08/09/2020   Procedure: TRANSESOPHAGEAL ECHOCARDIOGRAM (TEE);  Surgeon: [name], MD;  Location: [facility];  Service: Open Heart Surgery;  Laterality: N/A;    Social History   Socioeconomic History  . Marital status: Widowed    Spouse name: Not on file  . Number of children: Not on file  . Years of education: Not on file  . Highest education level: Not on file  Occupational History    Comment: Disabled  Tobacco Use  . Smoking status: Former Research scientist (life sciences)  . Smokeless tobacco: Never Used  . Tobacco comment: Quit ~2007  Vaping Use  . Vaping Use: Never used  Substance and Sexual Activity  . Alcohol use: No  . Drug use: No  . Sexual activity: Not Currently  Other Topics Concern  . Not on file  Social History Narrative  . Not on file   Social Determinants of Health  Financial Resource Strain: Not on file  Food Insecurity: Not on file  Transportation Needs: Not on file  Physical Activity: Not on file  Stress: Not on file  Social Connections: Not on file  Intimate Partner Violence: Not on file    Family History  Problem Relation Age of Onset  . CAD Other        Multiple family members with CABG  . Heart disease Mother        CABG  . Heart attack Mother   . Stroke Mother   . Heart failure  Mother   . Cancer Father   . Heart disease Brother        cabg  . Heart attack Brother     Current Outpatient Medications  Medication Sig Dispense Refill  . acetaminophen (TYLENOL) 325 MG tablet Take 2 tablets (650 mg total) by mouth every 6 (six) hours as needed for mild pain, headache or fever.    . ALPRAZolam (XANAX) 0.25 MG tablet TAKE 1 TABLET(0.25 MG) BY MOUTH TWICE DAILY AS NEEDED FOR ANXIETY 60 tablet 0  . aspirin 81 MG EC tablet Take 1 tablet (81 mg total) by mouth daily. 30 tablet   . atorvastatin (LIPITOR) 80 MG tablet Take 1 tablet (80 mg total) by mouth every evening. 30 tablet 3  . carvedilol (COREG) 3.125 MG tablet Take 1 tablet (3.125 mg total) by mouth 2 (two) times daily. 60 tablet 3  . clopidogrel (PLAVIX) 75 MG tablet Take 1 tablet (75 mg total) by mouth daily. 90 tablet 0  . furosemide (LASIX) 40 MG tablet Take 1 tablet (40 mg total) by mouth daily. 90 tablet 1  . nitroGLYCERIN (NITROSTAT) 0.4 MG SL tablet Place 1 tablet (0.4 mg total) under the tongue every 5 (five) minutes as needed for chest pain (up to 3 doses). 25 tablet 3  . traMADol (ULTRAM) 50 MG tablet Take 1 tablet (50 mg total) by mouth every 4 (four) hours as needed for moderate pain. 30 tablet 0   No current facility-administered medications for this visit.    Allergies  Allergen Reactions  . Penicillins     Unknown reaction     REVIEW OF SYSTEMS:   [X]  denotes positive finding, [ ]  denotes negative finding Cardiac  Comments:  Chest pain or chest pressure:    Shortness of breath upon exertion:    Short of breath when lying flat:    Irregular heart rhythm:        Vascular    Pain in calf, thigh, or hip brought on by ambulation:    Pain in feet at night that wakes you up from your sleep:     Blood clot in your veins:    Leg swelling:         Pulmonary    Oxygen at home:    Productive cough:     Wheezing:         Neurologic    Sudden weakness in arms or legs:     Sudden numbness in  arms or legs:     Sudden onset of difficulty speaking or slurred speech:    Temporary loss of vision in one eye:     Problems with dizziness:         Gastrointestinal    Blood in stool:     Vomited blood:         Genitourinary    Burning when urinating:     Blood in urine:  Psychiatric    Major depression:         Hematologic    Bleeding problems:    Problems with blood clotting too easily:        Skin    Rashes or ulcers:        Constitutional    Fever or chills:      PHYSICAL EXAMINATION:  Vitals:   09/20/20 1208  BP: (!) 123/92  Pulse: 90  Resp: 20  Temp: 98.5 F (36.9 C)  TempSrc: Temporal  SpO2: 99%  Weight: 194 lb 3.2 oz (88.1 kg)  Height: 5\' 8"  (1.727 m)    General:  WDWN in NAD; vital signs documented above Gait: Not observed HENT: WNL, normocephalic Pulmonary: normal non-labored breathing Cardiac: regular HR Abdomen: soft, NT, no masses Skin: without rashes Vascular Exam/Pulses:  Right Left  Radial 2+ (normal) 2+ (normal)  DP 2+ (normal) 2+ (normal)   Extremities: without ischemic changes, without Gangrene , without cellulitis; without open wounds; stasis pigmentation changes bilateral medial lower legs; no open ulcerations or significant edema Musculoskeletal: no muscle wasting or atrophy  Neurologic: A&O X 3;  No focal weakness or paresthesias are detected Psychiatric:  The pt has Normal affect.   Non-Invasive Vascular Imaging:   Bilateral common femoral vein incompetent  Bilateral greater saphenous vein not visualized  Negative for DVT    ASSESSMENT/PLAN:: 60 y.o. male here for follow up for bilateral lower extremity venous stasis ulcerations  -After bilateral greater saphenous vein harvest as well as conservative measures including compression and elevation, venous ulcerations of both legs are well-healed -Bilateral lower extremity venous reflux study shows mild deep venous reflux; study was negative for DVT -Going forward if  patient were to be bothered by venous symptoms such as edema recommendations would include knee-high compression 15 to 20 mmHg and periodic elevation during the day -Nothing further to add from vascular surgery standpoint; patient may follow-up as needed   Dagoberto Ligas, PA-C [facility] [phone]  Clinic MD:   Carlis Abbott

## 2020-09-22 ENCOUNTER — Other Ambulatory Visit: Payer: Self-pay | Admitting: Thoracic Surgery (Cardiothoracic Vascular Surgery)

## 2020-09-22 DIAGNOSIS — Z951 Presence of aortocoronary bypass graft: Secondary | ICD-10-CM

## 2020-09-23 ENCOUNTER — Other Ambulatory Visit: Payer: Self-pay

## 2020-09-23 ENCOUNTER — Ambulatory Visit (INDEPENDENT_AMBULATORY_CARE_PROVIDER_SITE_OTHER): Payer: Self-pay | Admitting: Thoracic Surgery (Cardiothoracic Vascular Surgery)

## 2020-09-23 ENCOUNTER — Ambulatory Visit
Admission: RE | Admit: 2020-09-23 | Discharge: 2020-09-23 | Disposition: A | Discharge status: Home / Self Care | Payer: Medicare Other | Source: Ambulatory Visit | Attending: Thoracic Surgery (Cardiothoracic Vascular Surgery) | Admitting: Thoracic Surgery (Cardiothoracic Vascular Surgery)

## 2020-09-23 ENCOUNTER — Encounter: Payer: Self-pay | Admitting: Thoracic Surgery (Cardiothoracic Vascular Surgery)

## 2020-09-23 VITALS — BP 136/87 | HR 80 | Resp 20 | Ht 68.0 in | Wt 196.0 lb

## 2020-09-23 DIAGNOSIS — Z951 Presence of aortocoronary bypass graft: Secondary | ICD-10-CM

## 2020-09-23 NOTE — Progress Notes (Signed)
      [id number]       ,[place] [address]             [phone]        Damein R Mandujano [place] Medical Record #[id number] Date of Birth: Jul 02, 1960  Referring: Sharmon Revere Primary Care: Cleophas Dunker, DO Primary Cardiologist:Heather Renae Fickle, MD  Reason for visit:   follow-up  History of Present Illness:     60 yo male presents for his 1 month appointment.  Overall doing well.  He has some chest wall tenderness with sneezing and cough.  He is ambulating well  Physical Exam: BP 136/87   Pulse 80   Resp 20   Ht 5\' 8"  (1.727 m)   Wt 196 lb (88.9 kg)   SpO2 99% Comment: RA  BMI 29.80 kg/m   Alert NAD Incision clean.  Sternum stable Abdomen soft, ND no peripheral edema   Diagnostic Studies & Laboratory data: CXR: clear     Assessment / Plan:   60 yo male s/p CABG.  Doing well Clear for cardiac rehab Follow-up prn    [name] 09/23/2020 12:35 PM

## 2020-09-27 ENCOUNTER — Telehealth (HOSPITAL_COMMUNITY): Payer: Self-pay | Admitting: Family Medicine

## 2020-09-27 ENCOUNTER — Telehealth (HOSPITAL_COMMUNITY): Payer: Self-pay

## 2020-09-27 DIAGNOSIS — Z951 Presence of aortocoronary bypass graft: Secondary | ICD-10-CM

## 2020-09-27 NOTE — Telephone Encounter (Signed)
Returned pt phone call in regards to CR, adv pt of CR backlog (1-3 months) and that we will contact him at a later date.

## 2020-09-28 ENCOUNTER — Other Ambulatory Visit: Payer: Self-pay

## 2020-09-28 ENCOUNTER — Other Ambulatory Visit: Payer: Self-pay | Admitting: *Deleted

## 2020-09-28 ENCOUNTER — Encounter: Payer: Self-pay | Admitting: Family Medicine

## 2020-09-28 ENCOUNTER — Encounter: Payer: Medicare Other | Attending: Cardiology | Admitting: *Deleted

## 2020-09-28 ENCOUNTER — Telehealth (HOSPITAL_COMMUNITY): Payer: Self-pay | Admitting: *Deleted

## 2020-09-28 DIAGNOSIS — Z5189 Encounter for other specified aftercare: Secondary | ICD-10-CM | POA: Insufficient documentation

## 2020-09-28 DIAGNOSIS — Z951 Presence of aortocoronary bypass graft: Secondary | ICD-10-CM | POA: Insufficient documentation

## 2020-09-28 DIAGNOSIS — I252 Old myocardial infarction: Secondary | ICD-10-CM | POA: Insufficient documentation

## 2020-09-28 NOTE — Progress Notes (Signed)
Virtual orientation call completed today. he has an appointment on Date: 09/29/2020 for EP eval and gym Orientation.  Documentation of diagnosis can be found in [facility] Date: 07/2020.

## 2020-09-28 NOTE — Telephone Encounter (Signed)
-----   Message from Freada Bergeron, MD sent at 09/28/2020  5:28 PM EDT ----- Regarding: FW: signing a referral for Cardiac Rehab  ----- Message ----- From: Lynford Humphrey, RN Sent: 09/28/2020   2:29 PM EDT To: Freada Bergeron, MD Subject: signing a referral for [facility],  This patient is anxious to get started in the program and we need a new referral signed as Dr [name] is no longer here. She signed original referral.  I sent the referral to you for signing.  As soon as completed, we will get him started.  Thank you for your assistance with this referral, Wynona Canes

## 2020-09-28 NOTE — Telephone Encounter (Signed)
Able to call area Cardiac rehab programs to find out where their program is with scheduling.  [facility] able to get him in as an add on for May 17th at 8:00 - this was too early for him. Next available mid to late June.  Pt rambled about wanting to get started sooner.  Called [facility]- first available is the first week of June.  Pt rambled about not knowing where to go.  Called [facility] able to get him in tomorrow or next week. Will send this referral over to their staff and notify them. Cherre Huger, BSN Cardiac and Training and development officer

## 2020-09-28 NOTE — Telephone Encounter (Signed)
New referral for cardiac Rehab placed on this pt.  Will send this message to Cardiac Rehab as an FYI, to make them aware referral was placed.

## 2020-09-29 ENCOUNTER — Telehealth: Payer: Self-pay | Admitting: *Deleted

## 2020-09-29 VITALS — Ht 68.0 in | Wt 196.6 lb

## 2020-09-29 DIAGNOSIS — Z951 Presence of aortocoronary bypass graft: Secondary | ICD-10-CM

## 2020-09-29 DIAGNOSIS — I252 Old myocardial infarction: Secondary | ICD-10-CM | POA: Diagnosis not present

## 2020-09-29 DIAGNOSIS — Z5189 Encounter for other specified aftercare: Secondary | ICD-10-CM | POA: Diagnosis not present

## 2020-09-29 NOTE — Patient Instructions (Signed)
Patient Instructions  Patient Details  Name: Lonnie Skinner MRN: [id number] Date of Birth: 06/11/1960 Referring Provider:  Freada Bergeron, MD  Below are your personal goals for exercise, nutrition, and risk factors. Our goal is to help you stay on track towards obtaining and maintaining these goals. We will be discussing your progress on these goals with you throughout the program.  Initial Exercise Prescription:  Initial Exercise Prescription - 09/29/20 1500      Date of Initial Exercise RX and Referring Provider   Date 09/29/20    Referring Provider [name]      Treadmill   MPH 2.4    Grade 1.5    Minutes 15    METs 3.3      NuStep   Level 3    SPM 80    Minutes 15    METs 3.45      Biostep-RELP   Level 3    SPM 50    Minutes 15    METs 3      Track   Laps 45    Minutes 15    METs 3.4      Prescription Details   Frequency (times per week) 3    Duration Progress to 30 minutes of continuous aerobic without signs/symptoms of physical distress      Intensity   THRR 40-80% of Max Heartrate 114-145    Ratings of Perceived Exertion 11-13    Perceived Dyspnea 0-4      Resistance Training   Training Prescription Yes    Weight 3 lb    Reps 10-15           Exercise Goals: Frequency: Be able to perform aerobic exercise two to three times per week in program working toward 2-5 days per week of home exercise.  Intensity: Work with a perceived exertion of 11 (fairly light) - 15 (hard) while following your exercise prescription.  We will make changes to your prescription with you as you progress through the program.   Duration: Be able to do 30 to 45 minutes of continuous aerobic exercise in addition to a 5 minute warm-up and a 5 minute cool-down routine.   Nutrition Goals: Your personal nutrition goals will be established when you do your nutrition analysis with the dietician.  The following are general nutrition guidelines to follow: Cholesterol <  200mg /day Sodium < 1500mg /day Fiber: Men over 50 yrs - 30 grams per day  Personal Goals:  Personal Goals and Risk Factors at Admission - 09/29/20 1550      Core Components/Risk Factors/Patient Goals on Admission    Weight Management Yes    Admit Weight 196 lb (88.9 kg)    Goal Weight: Short Term 194 lb (88 kg)    Goal Weight: Long Term 180 lb (81.6 kg)    Expected Outcomes Short Term: Continue to assess and modify interventions until short term weight is achieved;Long Term: Adherence to nutrition and physical activity/exercise program aimed toward attainment of established weight goal;Weight Loss: Understanding of general recommendations for a balanced deficit meal plan, which promotes 1-2 lb weight loss per week and includes a negative energy balance of [phone] kcal/d    Hypertension Yes    Intervention Provide education on lifestyle modifcations including regular physical activity/exercise, weight management, moderate sodium restriction and increased consumption of fresh fruit, vegetables, and low fat dairy, alcohol moderation, and smoking cessation.;Monitor prescription use compliance.    Expected Outcomes Short Term: Continued assessment and intervention until BP is <  140/66mm HG in hypertensive participants. < 130/65mm HG in hypertensive participants with diabetes, heart failure or chronic kidney disease.;Long Term: Maintenance of blood pressure at goal levels.    Lipids Yes    Intervention Provide education and support for participant on nutrition & aerobic/resistive exercise along with prescribed medications to achieve LDL 70mg , HDL >40mg .    Expected Outcomes Short Term: Participant states understanding of desired cholesterol values and is compliant with medications prescribed. Participant is following exercise prescription and nutrition guidelines.;Long Term: Cholesterol controlled with medications as prescribed, with individualized exercise RX and with personalized nutrition plan. Value  goals: LDL < 70mg , HDL > 40 mg.           Tobacco Use Initial Evaluation: Social History   Tobacco Use  Smoking Status Former Smoker  Smokeless Tobacco Never Used  Tobacco Comment   Quit ~2007    Exercise Goals and Review:  Exercise Goals    Row Name 09/29/20 1547             Exercise Goals   Increase Physical Activity Yes       Intervention Provide advice, education, support and counseling about physical activity/exercise needs.;Develop an individualized exercise prescription for aerobic and resistive training based on initial evaluation findings, risk stratification, comorbidities and participant's personal goals.       Expected Outcomes Short Term: Attend rehab on a regular basis to increase amount of physical activity.;Long Term: Exercising regularly at least 3-5 days a week.       Increase Strength and Stamina Yes       Intervention Provide advice, education, support and counseling about physical activity/exercise needs.;Develop an individualized exercise prescription for aerobic and resistive training based on initial evaluation findings, risk stratification, comorbidities and participant's personal goals.       Expected Outcomes Short Term: Increase workloads from initial exercise prescription for resistance, speed, and METs.;Short Term: Perform resistance training exercises routinely during rehab and add in resistance training at home;Long Term: Improve cardiorespiratory fitness, muscular endurance and strength as measured by increased METs and functional capacity (6MWT)       Able to understand and use rate of perceived exertion (RPE) scale Yes       Intervention Provide education and explanation on how to use RPE scale       Expected Outcomes Short Term: Able to use RPE daily in rehab to express subjective intensity level;Long Term:  Able to use RPE to guide intensity level when exercising independently       Able to understand and use Dyspnea scale Yes       Intervention  Provide education and explanation on how to use Dyspnea scale       Expected Outcomes Short Term: Able to use Dyspnea scale daily in rehab to express subjective sense of shortness of breath during exertion;Long Term: Able to use Dyspnea scale to guide intensity level when exercising independently       Knowledge and understanding of Target Heart Rate Range (THRR) Yes       Intervention Provide education and explanation of THRR including how the numbers were predicted and where they are located for reference       Expected Outcomes Short Term: Able to state/look up THRR;Long Term: Able to use THRR to govern intensity when exercising independently       Able to check pulse independently Yes       Intervention Provide education and demonstration on how to check pulse in carotid and radial arteries.;Review  the importance of being able to check your own pulse for safety during independent exercise       Expected Outcomes Short Term: Able to explain why pulse checking is important during independent exercise;Long Term: Able to check pulse independently and accurately       Understanding of Exercise Prescription Yes       Intervention Provide education, explanation, and written materials on patient's individual exercise prescription       Expected Outcomes Short Term: Able to explain program exercise prescription;Long Term: Able to explain home exercise prescription to exercise independently              Copy of goals given to participant.

## 2020-09-29 NOTE — Telephone Encounter (Signed)
Message Received: Today Rowe Pavy, RN  Winifred Olive M, LPN Charlane Ferretti Pt wanted to get started sooner than our schedule would allow. I called around to the area programs and he decided to go to [facility]. Scheduled appt for today 5/12.  Will close this referral for [place].   Carlette
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
## 2020-09-29 NOTE — Telephone Encounter (Signed)
We have him starting today. Thank you for the quick referral turnaround  ----- Message -----  From: Nuala Alpha, LPN  Sent: 6/31/4970  5:32 PM EDT  To: Lynford Humphrey, RN

## 2020-10-03 ENCOUNTER — Other Ambulatory Visit: Payer: Self-pay

## 2020-10-03 DIAGNOSIS — Z951 Presence of aortocoronary bypass graft: Secondary | ICD-10-CM

## 2020-10-03 DIAGNOSIS — Z5189 Encounter for other specified aftercare: Secondary | ICD-10-CM | POA: Diagnosis not present

## 2020-10-03 DIAGNOSIS — I252 Old myocardial infarction: Secondary | ICD-10-CM | POA: Diagnosis not present

## 2020-10-03 NOTE — Progress Notes (Signed)
Daily Session Note  Patient Details  Name: Lonnie Skinner MRN: [id number] Date of Birth: Oct 29, 1960 Referring Provider:   Flowsheet Row Cardiac Rehab from 09/29/2020 in [facility]  Referring Provider [name]      Encounter Date: 10/03/2020  Check In:  Session Check In - 10/03/20 1341      Check-In   Supervising physician immediately available to respond to emergencies See telemetry face sheet for immediately available ER MD    Location [facility]-Cardiac & Pulmonary Rehab    Staff Present Birdie Sons, MPA, Mauricia Area, BS, ACSM CEP, Exercise Physiologist;Laureen Owens Shark, BS, RRT, CPFT    Virtual Visit No    Medication changes reported     No    Fall or balance concerns reported    No    Warm-up and Cool-down Performed on first and last piece of equipment    Resistance Training Performed Yes    VAD Patient? No    PAD/SET Patient? No      Pain Assessment   Currently in Pain? No/denies              Social History   Tobacco Use  Smoking Status Former Smoker  Smokeless Tobacco Never Used  Tobacco Comment   Quit ~2007    Goals Met:  Independence with exercise equipment Exercise tolerated well No report of cardiac concerns or symptoms Strength training completed today  Goals Unmet:  Not Applicable  Comments: First full day of exercise!  Patient was oriented to gym and equipment including functions, settings, policies, and procedures.  Patient's individual exercise prescription and treatment plan were reviewed.  All starting workloads were established based on the results of the 6 minute walk test done at initial orientation visit.  The plan for exercise progression was also introduced and progression will be customized based on patient's performance and goals.     Dr. Emily Filbert is Medical Director for [facility].

## 2020-10-05 ENCOUNTER — Encounter: Payer: Self-pay | Admitting: *Deleted

## 2020-10-05 ENCOUNTER — Encounter (HOSPITAL_BASED_OUTPATIENT_CLINIC_OR_DEPARTMENT_OTHER): Payer: Medicare Other

## 2020-10-05 ENCOUNTER — Other Ambulatory Visit: Payer: Self-pay

## 2020-10-05 DIAGNOSIS — Z951 Presence of aortocoronary bypass graft: Secondary | ICD-10-CM

## 2020-10-05 DIAGNOSIS — Z5189 Encounter for other specified aftercare: Secondary | ICD-10-CM | POA: Diagnosis not present

## 2020-10-05 DIAGNOSIS — I252 Old myocardial infarction: Secondary | ICD-10-CM | POA: Diagnosis not present

## 2020-10-05 NOTE — Progress Notes (Signed)
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 Completed 6MWT and gym orientation. Initial ITP created and sent for review to Dr. Emily Filbert, Medical Director. First full day of exercise!  Patient was oriented to gym and equipment including functions, settings, policies, and procedures.  Patient's individual exercise prescription and treatment plan were reviewed.  All starting workloads were established based on the results of the 6 minute walk test done at initial orientation visit.  The plan for exercise progression was also introduced and progression will be customized based on patient's performance and goals. 30 Day review completed. Medical Director ITP review done, changes made as directed, and signed approval by Medical Director.    New to program           Comments:

## 2020-10-05 NOTE — Progress Notes (Signed)
Daily Session Note  Patient Details  Name: Lonnie Skinner MRN: [id number] Date of Birth: 12/07/1960 Referring Provider:   Flowsheet Row Cardiac Rehab from 09/29/2020 in [facility] Cardiac and Pulmonary Rehab  Referring Provider [place]      Encounter Date: 10/05/2020  Check In:  Session Check In - 10/05/20 1345      Check-In   Supervising physician immediately available to respond to emergencies See telemetry face sheet for immediately available ER MD    Location [facility]Cardiac & Pul[facility] Rehab    Staff Present Kelly Bollinger, MPA, RN;Joseph Hood RCP,RRT,BSRT;Kara Langdon, MS Exercise Physiologist    Virtual Visit No    Medication changes reported     No    Fall or balance concerns reported    No    Warm-up and Cool-down Performed on first and last piece of equipment    Resistance Training Performed Yes    VAD Patient? No    PAD/SET Patient? No      Pain Assessment   Currently in Pain? No/denies              Social History   Tobacco Use  Smoking Status Former Smoker  Smokeless Tobacco Never Used  Tobacco Comment   Quit ~2007    Goals Met:  Independence with exercise equipment Exercise tolerated well No report of cardiac concerns or symptoms Strength training completed today  Goals Unmet:  Not Applicable  Comments: Pt able to follow exercise prescription today without complaint.  Will continue to monitor for progression.    Dr. Mark Miller is Medical Director for [facility] and [facility]. 

## 2020-10-06 ENCOUNTER — Encounter: Payer: Medicare Other | Admitting: *Deleted

## 2020-10-06 ENCOUNTER — Other Ambulatory Visit: Payer: Self-pay

## 2020-10-06 DIAGNOSIS — Z951 Presence of aortocoronary bypass graft: Secondary | ICD-10-CM | POA: Diagnosis not present

## 2020-10-06 DIAGNOSIS — Z5189 Encounter for other specified aftercare: Secondary | ICD-10-CM | POA: Diagnosis not present

## 2020-10-06 DIAGNOSIS — I252 Old myocardial infarction: Secondary | ICD-10-CM | POA: Diagnosis not present

## 2020-10-06 NOTE — Progress Notes (Signed)
Daily Session Note  Patient Details  Name: KOUPER SPINELLA MRN: [id number] Date of Birth: 06/18/1960 Referring Provider:   Flowsheet Row Cardiac Rehab from 09/29/2020 in [facility] Cardiac and Pulmonary Rehab  Referring Provider [name]      Encounter Date: 10/06/2020  Check In:  Session Check In - 10/06/20 1328      Check-In   Supervising physician immediately available to respond to emergencies See telemetry face sheet for immediately available ER MD    Location [facility]    Staff Present Renita Papa, RN BSN;Joseph [address] [place], [place], [place], CCRP, CCET    Virtual Visit No    Medication changes reported     No    Fall or balance concerns reported    No    Warm-up and Cool-down Performed on first and last piece of equipment    Resistance Training Performed Yes    VAD Patient? No    PAD/SET Patient? No      Pain Assessment   Currently in Pain? No/denies              Social History   Tobacco Use  Smoking Status Former Smoker  Smokeless Tobacco Never Used  Tobacco Comment   Quit ~2007    Goals Met:  Independence with exercise equipment Exercise tolerated well No report of cardiac concerns or symptoms Strength training completed today  Goals Unmet:  Not Applicable  Comments: Pt able to follow exercise prescription today without complaint.  Will continue to monitor for progression.    Dr. Emily Filbert is Medical Director for [place] and [facility].

## 2020-10-10 ENCOUNTER — Other Ambulatory Visit: Payer: Self-pay

## 2020-10-10 DIAGNOSIS — Z5189 Encounter for other specified aftercare: Secondary | ICD-10-CM | POA: Diagnosis not present

## 2020-10-10 DIAGNOSIS — Z951 Presence of aortocoronary bypass graft: Secondary | ICD-10-CM

## 2020-10-10 DIAGNOSIS — I252 Old myocardial infarction: Secondary | ICD-10-CM | POA: Diagnosis not present

## 2020-10-10 NOTE — Progress Notes (Signed)
Daily Session Note  Patient Details  Name: Lonnie Skinner MRN: [id number] Date of Birth: 1961-01-02 Referring Provider:   Flowsheet Row Cardiac Rehab from 09/29/2020 in [facility] Cardiac and Pulmonary Rehab  Referring Provider [name]      Encounter Date: 10/10/2020  Check In:  Session Check In - 10/10/20 1342      Check-In   Supervising physician immediately available to respond to emergencies See telemetry face sheet for immediately available ER MD    Location [facility]    Staff Present Birdie Sons, MPA, Mauricia Area, BS, ACSM CEP, Exercise Physiologist;Kara Eliezer Bottom, MS, ASCM CEP, Exercise Physiologist    Virtual Visit No    Medication changes reported     No    Fall or balance concerns reported    No    Warm-up and Cool-down Performed on first and last piece of equipment    Resistance Training Performed Yes    VAD Patient? No    PAD/SET Patient? No      Pain Assessment   Currently in Pain? No/denies              Social History   Tobacco Use  Smoking Status Former Smoker  Smokeless Tobacco Never Used  Tobacco Comment   Quit ~2007    Goals Met:  Independence with exercise equipment Exercise tolerated well No report of cardiac concerns or symptoms Strength training completed today  Goals Unmet:  Not Applicable  Comments: Pt able to follow exercise prescription today without complaint.  Will continue to monitor for progression.    Dr. Emily Filbert is Medical Director for [place].  Dr. Ottie Glazier is Medical Director for [facility] Pulmonary Rehabilitation.

## 2020-10-12 ENCOUNTER — Other Ambulatory Visit: Payer: Self-pay

## 2020-10-12 DIAGNOSIS — Z951 Presence of aortocoronary bypass graft: Secondary | ICD-10-CM | POA: Diagnosis not present

## 2020-10-12 DIAGNOSIS — Z5189 Encounter for other specified aftercare: Secondary | ICD-10-CM | POA: Diagnosis not present

## 2020-10-12 DIAGNOSIS — I252 Old myocardial infarction: Secondary | ICD-10-CM | POA: Diagnosis not present

## 2020-10-12 NOTE — Progress Notes (Signed)
Daily Session Note  Patient Details  Name: MAURIE OLESEN MRN: [id number] Date of Birth: 05/28/1960 Referring Provider:   Flowsheet Row Cardiac Rehab from 09/29/2020 in [facility] Cardiac and Pulmonary Rehab  Referring Provider [name]      Encounter Date: 10/12/2020  Check In:  Session Check In - 10/12/20 1338      Check-In   Supervising physician immediately available to respond to emergencies See telemetry face sheet for immediately available ER MD    Location [facility]    Staff Present Birdie Sons, MPA, RN;Joseph Lou Miner, MS, ASCM CEP, Exercise Physiologist    Virtual Visit No    Medication changes reported     No    Fall or balance concerns reported    No    Warm-up and Cool-down Performed on first and last piece of equipment    Resistance Training Performed Yes    VAD Patient? No    PAD/SET Patient? No      Pain Assessment   Currently in Pain? No/denies              Social History   Tobacco Use  Smoking Status Former Smoker  Smokeless Tobacco Never Used  Tobacco Comment   Quit ~2007    Goals Met:  Independence with exercise equipment Exercise tolerated well No report of cardiac concerns or symptoms Strength training completed today  Goals Unmet:  Not Applicable  Comments: Pt able to follow exercise prescription today without complaint.  Will continue to monitor for progression.    Dr. Emily Filbert is Medical Director for [place].  Dr. Ottie Glazier is Medical Director for [facility] Pulmonary Rehabilitation.

## 2020-10-13 ENCOUNTER — Encounter: Payer: Medicare Other | Admitting: *Deleted

## 2020-10-13 DIAGNOSIS — Z951 Presence of aortocoronary bypass graft: Secondary | ICD-10-CM | POA: Diagnosis not present

## 2020-10-13 DIAGNOSIS — Z5189 Encounter for other specified aftercare: Secondary | ICD-10-CM | POA: Diagnosis not present

## 2020-10-13 DIAGNOSIS — I252 Old myocardial infarction: Secondary | ICD-10-CM | POA: Diagnosis not present

## 2020-10-13 NOTE — Progress Notes (Signed)
Daily Session Note  Patient Details  Name: Lonnie Skinner MRN: [id number] Date of Birth: 01-Aug-1960 Referring Provider:   Flowsheet Row Cardiac Rehab from 09/29/2020 in [facility] Cardiac and Pulmonary Rehab  Referring Provider [name]      Encounter Date: 10/13/2020  Check In:  Session Check In - 10/13/20 1330      Check-In   Supervising physician immediately available to respond to emergencies See telemetry face sheet for immediately available ER MD    Location [facility]    Staff Present Renita Papa, RN BSN;Jessica Luan Pulling, MA, RCEP, CCRP, CCET;Melissa Beaulieu RDN, LDN    Virtual Visit No    Medication changes reported     No    Fall or balance concerns reported    No    Warm-up and Cool-down Performed on first and last piece of equipment    Resistance Training Performed Yes    VAD Patient? No      Pain Assessment   Currently in Pain? No/denies              Social History   Tobacco Use  Smoking Status Former Smoker  Smokeless Tobacco Never Used  Tobacco Comment   Quit ~2007    Goals Met:  Independence with exercise equipment Exercise tolerated well No report of cardiac concerns or symptoms Strength training completed today  Goals Unmet:  Not Applicable  Comments: Pt able to follow exercise prescription today without complaint.  Will continue to monitor for progression.    Dr. Emily Filbert is Medical Director for [place].  Dr. Ottie Glazier is Medical Director for [facility] Pulmonary Rehabilitation.

## 2020-10-19 ENCOUNTER — Other Ambulatory Visit: Payer: Self-pay

## 2020-10-19 ENCOUNTER — Encounter: Payer: Medicare Other | Attending: Cardiology | Admitting: *Deleted

## 2020-10-19 DIAGNOSIS — Z951 Presence of aortocoronary bypass graft: Secondary | ICD-10-CM | POA: Insufficient documentation

## 2020-10-19 NOTE — Progress Notes (Signed)
Daily Session Note  Patient Details  Name: EMMERICH CRYER MRN: [id number] Date of Birth: 08-02-1960 Referring Provider:   Flowsheet Row Cardiac Rehab from 09/29/2020 in [facility] Card[facility] Rehab  Referring Provider [name]      Encounter Date: 10/19/2020  Check In:  Session Check In - 10/19/20 1343      Check-In   Supervising physician immediately available to respond to emergencies See telemetry face sheet for immediately available ER MD    Location [facility]    Staff Present Renita Papa, RN BSN;Melissa Caiola RDN, Tawanna Solo, MS, ASCM CEP, Exercise Physiologist    Virtual Visit No    Medication changes reported     No    Fall or balance concerns reported    No    Warm-up and Cool-down Performed on first and last piece of equipment    Resistance Training Performed Yes    VAD Patient? No    PAD/SET Patient? No      Pain Assessment   Currently in Pain? No/denies              Social History   Tobacco Use  Smoking Status Former Smoker  Smokeless Tobacco Never Used  Tobacco Comment   Quit ~2007    Goals Met:  Independence with exercise equipment Exercise tolerated well No report of cardiac concerns or symptoms Strength training completed today  Goals Unmet:  Not Applicable  Comments: Pt able to follow exercise prescription today without complaint.  Will continue to monitor for progression.    Dr. Emily Filbert is Medical Director for [place].  Dr. Ottie Glazier is Medical Director for [facility] Pulmonary Rehabilitation.

## 2020-10-20 ENCOUNTER — Other Ambulatory Visit: Payer: Self-pay

## 2020-10-20 ENCOUNTER — Encounter: Payer: Medicare Other | Admitting: *Deleted

## 2020-10-20 DIAGNOSIS — Z951 Presence of aortocoronary bypass graft: Secondary | ICD-10-CM | POA: Diagnosis not present

## 2020-10-20 NOTE — Progress Notes (Signed)
Daily Session Note  Patient Details  Name: Lonnie Skinner MRN: [id number] Date of Birth: 1960/07/09 Referring Provider:   Flowsheet Row Cardiac Rehab from 09/29/2020 in [facility] Cardiac and Pulmonary Rehab  Referring Provider [name]      Encounter Date: 10/20/2020  Check In:  Session Check In - 10/20/20 1335      Check-In   Supervising physician immediately available to respond to emergencies See telemetry face sheet for immediately available ER MD    Location [facility]    Staff Present Renita Papa, RN BSN;Jessica Luan Pulling, MA, RCEP, CCRP, CCET;Joseph [place] RCP,RRT,BSRT    Virtual Visit No    Medication changes reported     No    Fall or balance concerns reported    No    Warm-up and Cool-down Performed on first and last piece of equipment    Resistance Training Performed Yes    VAD Patient? No    PAD/SET Patient? No      Pain Assessment   Currently in Pain? No/denies              Social History   Tobacco Use  Smoking Status Former Smoker  Smokeless Tobacco Never Used  Tobacco Comment   Quit ~2007    Goals Met:  Independence with exercise equipment Exercise tolerated well No report of cardiac concerns or symptoms Strength training completed today  Goals Unmet:  Not Applicable  Comments: Pt able to follow exercise prescription today without complaint.  Will continue to monitor for progression.    Dr. Emily Filbert is Medical Director for [place].  Dr. Ottie Glazier is Medical Director for [facility] Pulmonary Rehabilitation.

## 2020-10-24 ENCOUNTER — Other Ambulatory Visit: Payer: Self-pay

## 2020-10-24 DIAGNOSIS — Z951 Presence of aortocoronary bypass graft: Secondary | ICD-10-CM

## 2020-10-24 NOTE — Progress Notes (Signed)
Daily Session Note  Patient Details  Name: Lonnie Skinner MRN: [id number] Date of Birth: 1960-08-24 Referring Provider:   Flowsheet Row Cardiac Rehab from 09/29/2020 in [facility] Cardiac and Pulmonary Rehab  Referring Provider [name]      Encounter Date: 10/24/2020  Check In:  Session Check In - 10/24/20 1344      Check-In   Supervising physician immediately available to respond to emergencies See telemetry face sheet for immediately available ER MD    Location [facility]    Staff Present Birdie Sons, MPA, Mauricia Area, BS, ACSM CEP, Exercise Physiologist;Jessica Luan Pulling, MA, RCEP, CCRP, CCET    Virtual Visit No    Medication changes reported     No    Fall or balance concerns reported    No    Warm-up and Cool-down Performed on first and last piece of equipment    Resistance Training Performed Yes    VAD Patient? No    PAD/SET Patient? No      Pain Assessment   Currently in Pain? No/denies              Social History   Tobacco Use  Smoking Status Former Smoker  Smokeless Tobacco Never Used  Tobacco Comment   Quit ~2007    Goals Met:  Independence with exercise equipment Exercise tolerated well No report of cardiac concerns or symptoms Strength training completed today  Goals Unmet:  Not Applicable  Comments: Pt able to follow exercise prescription today without complaint.  Will continue to monitor for progression.    Dr. Emily Filbert is Medical Director for [place].  Dr. Ottie Glazier is Medical Director for [facility] Pulmonary Rehabilitation.

## 2020-10-26 ENCOUNTER — Other Ambulatory Visit: Payer: Self-pay

## 2020-10-26 DIAGNOSIS — Z951 Presence of aortocoronary bypass graft: Secondary | ICD-10-CM | POA: Diagnosis not present

## 2020-10-26 NOTE — Progress Notes (Signed)
Daily Session Note  Patient Details  Name: Lonnie Skinner MRN: [id number] Date of Birth: Jan 07, 1961 Referring Provider:   Flowsheet Row Cardiac Rehab from 09/29/2020 in [facility] Cardiac and Pulmonary Rehab  Referring Provider [name]      Encounter Date: 10/26/2020  Check In:  Session Check In - 10/26/20 1329      Check-In   Supervising physician immediately available to respond to emergencies See telemetry face sheet for immediately available ER MD    Location [facility]    Staff Present Birdie Sons, MPA, RN;Joseph Lou Miner, MS, ASCM CEP, Exercise Physiologist    Virtual Visit No    Medication changes reported     No    Fall or balance concerns reported    No    Warm-up and Cool-down Performed on first and last piece of equipment    Resistance Training Performed Yes    VAD Patient? No    PAD/SET Patient? No      Pain Assessment   Currently in Pain? No/denies              Social History   Tobacco Use  Smoking Status Former Smoker  Smokeless Tobacco Never Used  Tobacco Comment   Quit ~2007    Goals Met:  Independence with exercise equipment Exercise tolerated well No report of cardiac concerns or symptoms Strength training completed today  Goals Unmet:  Not Applicable  Comments: Pt able to follow exercise prescription today without complaint.  Will continue to monitor for progression.    Dr. Emily Filbert is Medical Director for [place].  Dr. Ottie Glazier is Medical Director for [facility] Pulmonary Rehabilitation.

## 2020-10-27 ENCOUNTER — Other Ambulatory Visit: Payer: Self-pay

## 2020-10-27 DIAGNOSIS — Z951 Presence of aortocoronary bypass graft: Secondary | ICD-10-CM | POA: Diagnosis not present

## 2020-10-27 NOTE — Progress Notes (Signed)
Daily Session Note  Patient Details  Name: Lonnie Skinner MRN: [id number] Date of Birth: 1960/09/07 Referring Provider:   Flowsheet Row Cardiac Rehab from 09/29/2020 in [facility] Cardiac and Pulmonary Rehab  Referring Provider [name]       Encounter Date: 10/27/2020  Check In:  Session Check In - 10/27/20 1330       Check-In   Supervising physician immediately available to respond to emergencies See telemetry face sheet for immediately available ER MD    Location [facility] Re[facility]    Staff Present Birdie Sons, MPA, RN;Joseph Darrin Ni[place], [place], RCEP, CCRP, CCET    Virtual Visit No    Medication changes reported     No    Fall or balance concerns reported    No    Warm-up and Cool-down Performed on first and last piece of equipment    Resistance Training Performed Yes    VAD Patient? No    PAD/SET Patient? No      Pain Assessment   Currently in Pain? No/denies                Social History   Tobacco Use  Smoking Status Former   Pack years: 0.00  Smokeless Tobacco Never  Tobacco Comments   Quit ~2007    Goals Met:  Independence with exercise equipment Exercise tolerated well No report of cardiac concerns or symptoms Strength training completed today  Goals Unmet:  Not Applicable  Comments: Pt able to follow exercise prescription today without complaint.  Will continue to monitor for progression.    Dr. Emily Filbert is Medical Director for [place].  Dr. Ottie Glazier is Medical Director for [facility] Pulmonary Rehabilitation.

## 2020-11-02 ENCOUNTER — Encounter: Payer: Self-pay | Admitting: *Deleted

## 2020-11-02 DIAGNOSIS — Z951 Presence of aortocoronary bypass graft: Secondary | ICD-10-CM

## 2020-11-02 NOTE — Progress Notes (Signed)
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             General Cardiac Education Topics:  AED/CPR: - Group verbal and written instruction with the use of models to demonstrate the basic use of the AED with the basic ABC's of resuscitation.   Anatomy and Cardiac Procedures: - Group verbal and visual presentation and models provide information about basic cardiac anatomy and function. Reviews the testing methods done to diagnose heart disease and the outcomes of the test results. Describes the treatment choices: Medical Management, Angioplasty, or Coronary Bypass Surgery for treating various heart conditions including Myocardial Infarction, Angina, Valve Disease, and Cardiac Arrhythmias.  Written material given at  graduation.   Medication Safety: - Group verbal and visual instruction to review commonly prescribed medications for heart and lung disease. Reviews the medication, class of the drug, and side effects. Includes the steps to properly store meds and maintain the prescription regimen.  Written material given at graduation. Flowsheet Row Cardiac Rehab from 10/26/2020 in [facility] Cardiac and Pulmonary Rehab  Date 10/05/20  Educator [facility]  Instruction Review Code 1- Verbalizes Understanding       Intimacy: - Group verbal instruction through game format to discuss how heart and lung disease can affect sexual intimacy. Written material given at graduation..   Know Your Numbers and Heart Failure: - Group verbal and visual instruction to discuss disease risk factors for cardiac and pulmonary disease and treatment options.  Reviews associated critical values for Overweight/Obesity, Hypertension, Cholesterol, and Diabetes.  Discusses basics of heart failure: signs/symptoms and treatments.  Introduces Heart Failure Zone chart for action plan for heart failure.  Written material given at graduation. Flowsheet Row Cardiac Rehab from 10/26/2020 in [facility] Cardiac and Pulmonary Rehab  Date 10/12/20  Educator [facility]  Instruction Review Code 1- Verbalizes Understanding       Infection Prevention: - Provides verbal and written material to individual with discussion of infection control including proper hand washing and proper equipment cleaning during exercise session. Flowsheet Row Cardiac Rehab from 10/26/2020 in [facility] Cardiac and Pulmonary Rehab  Date 09/29/20  Educator AS  Instruction Review Code 1- Verbalizes Understanding       Falls Prevention: - Provides verbal and written material to individual with discussion of falls prevention and safety. Flowsheet Row Cardiac Rehab from 10/26/2020 in [facility] Cardiac and Pulmonary Rehab  Date 09/29/20  Educator AS  Instruction Review Code 1- Verbalizes Understanding        Other: -Provides group and verbal instruction on various topics (see comments)   Knowledge Questionnaire Score:  Knowledge Questionnaire Score - 09/29/20 1549       Knowledge Questionnaire Score   Pre Score 16/26             Core Components/Risk Factors/Patient Goals at Admission:  Personal Goals and Risk Factors at Admission - 09/29/20 1550       Core Components/Risk Factors/Patient Goals on Admission    Weight Management Yes    Admit Weight 196 lb (88.9 kg)    Goal Weight: Short Term 194 lb (88 kg)    Goal Weight: Long Term 180 lb (81.6 kg)    Expected Outcomes Short Term: Continue to assess and modify interventions until short term weight is achieved;Long Term: Adherence to nutrition and physical activity/exercise program aimed toward attainment of established weight goal;Weight Loss: Understanding of general recommendations for a balanced deficit meal plan, which promotes 1-2 lb weight loss per week and includes a negative energy balance of [phone] kcal/d  Hypertension Yes    Intervention Provide education on lifestyle modifcations including regular physical activity/exercise, weight management, moderate sodium restriction and increased consumption of fresh fruit, vegetables, and low fat dairy, alcohol moderation, and smoking cessation.;Monitor prescription use compliance.    Expected Outcomes Short Term: Continued assessment and intervention until BP is < 140/74m HG in hypertensive participants. < 130/882mHG in hypertensive participants with diabetes, heart failure or chronic kidney disease.;Long Term: Maintenance of blood pressure at goal levels.    Lipids Yes    Intervention Provide education and support for participant on nutrition & aerobic/resistive exercise along with prescribed medications to achieve LDL <7064mHDL >23m48m  Expected Outcomes Short Term: Participant states understanding of desired cholesterol values and is compliant with medications prescribed.  Participant is following exercise prescription and nutrition guidelines.;Long Term: Cholesterol controlled with medications as prescribed, with individualized exercise RX and with personalized nutrition plan. Value goals: LDL < 70mg23mL > 40 mg.             Education:Diabetes - Individual verbal and written instruction to review signs/symptoms of diabetes, desired ranges of glucose level fasting, after meals and with exercise. Acknowledge that pre and post exercise glucose checks will be done for 3 sessions at entry of program.   Core Components/Risk Factors/Patient Goals Review:   Goals and Risk Factor Review     Row Name 10/26/20 1344             Core Components/Risk Factors/Patient Goals Review   Personal Goals Review Weight Management/Obesity       Review Jams wants to lose some weight but has since gone up a little. He is 203 pounds currently. He feels like he may be eating more than usual.       Expected Outcomes Short: lose 3 pounds. Long: reach a weight goal of 180 pounds.                Core Components/Risk Factors/Patient Goals at Discharge (Final Review):   Goals and Risk Factor Review - 10/26/20 1344       Core Components/Risk Factors/Patient Goals Review   Personal Goals Review Weight Management/Obesity    Review Jams wants to lose some weight but has since gone up a little. He is 203 pounds currently. He feels like he may be eating more than usual.    Expected Outcomes Short: lose 3 pounds. Long: reach a weight goal of 180 pounds.             ITP Comments:  ITP Comments     Row Name 09/28/20 1610 09/29/20 1556 10/03/20 1342 10/05/20 0730 11/02/20 0823   ITP Comments Virtual orientation call completed today. he has an appointment on Date: 09/29/2020 for EP eval and gym Orientation.  [facility]. Completed 6MWT and gym orientation. Initial ITP created and sent for review to Dr. Mark Emily Filbertical  Director. First full day of exercise!  Patient was oriented to gym and equipment including functions, settings, policies, and procedures.  Patient's individual exercise prescription and treatment plan were reviewed.  All starting workloads were established based on the results of the 6 minute walk test done at initial orientation visit.  The plan for exercise progression was also introduced and progression will be customized based on patient's performance and goals. 30 Day review completed. Medical Director ITP review done, changes made as directed, and signed approval by Medical Director.    New to program 30  Day review completed. Medical Director ITP review done, changes made as directed, and signed approval by Medical Director.            Comments:

## 2020-11-07 ENCOUNTER — Other Ambulatory Visit: Payer: Self-pay

## 2020-11-07 DIAGNOSIS — Z951 Presence of aortocoronary bypass graft: Secondary | ICD-10-CM

## 2020-11-07 NOTE — Progress Notes (Signed)
Daily Session Note  Patient Details  Name: Lonnie Skinner MRN: [id number] Date of Birth: 12-27-1960 Referring Provider:   Flowsheet Row Cardiac Rehab from 09/29/2020 in [facility] Cardiac and Pulmonary Rehab  Referring Provider [name]       Encounter Date: 11/07/2020  Check In:  Session Check In - 11/07/20 1407       Check-In   Supervising physician immediately available to respond to emergencies See telemetry face sheet for immediately available ER MD    Location [facility]-Cardiac & Pulmonary Rehab    Staff Present Birdie Sons, MPA, Nino Glow, MS, ASCM CEP, Exercise Physiologist;Konstantinos Cordoba Amedeo Plenty, BS, ACSM CEP, Exercise Physiologist    Virtual Visit No    Medication changes reported     No    Fall or balance concerns reported    No    Warm-up and Cool-down Performed on first and last piece of equipment    Resistance Training Performed Yes    VAD Patient? No    PAD/SET Patient? No      Pain Assessment   Currently in Pain? No/denies                Social History   Tobacco Use  Smoking Status Former   Pack years: 0.00  Smokeless Tobacco Never  Tobacco Comments   Quit ~2007    Goals Met:  Independence with exercise equipment Exercise tolerated well No report of cardiac concerns or symptoms Strength training completed today  Goals Unmet:  Not Applicable  Comments: Pt able to follow exercise prescription today without complaint.  Will continue to monitor for progression.    Dr. Emily Filbert is Medical Director for [place].  Dr. Ottie Glazier is Medical Director for [facility] Pulmonary Rehabilitation.

## 2020-11-08 ENCOUNTER — Other Ambulatory Visit: Payer: Self-pay | Admitting: Physician Assistant

## 2020-11-09 ENCOUNTER — Other Ambulatory Visit: Payer: Self-pay

## 2020-11-09 DIAGNOSIS — Z951 Presence of aortocoronary bypass graft: Secondary | ICD-10-CM | POA: Diagnosis not present

## 2020-11-09 NOTE — Progress Notes (Signed)
Daily Session Note  Patient Details  Name: Lonnie Skinner MRN: [id number] Date of Birth: [birth year] Referring Provider:   Flowsheet Row Cardiac Rehab from 09/29/2020 in [facility] Cardiac and Pulmonary Rehab  Referring Provider [name]       Encounter Date: 11/09/2020  Check In:  Session Check In - 11/09/20 1329       Check-In   Supervising physician immediately available to respond to emergencies See telemetry face sheet for immediately available ER MD    Location [facility]-Cardiac & Pulmonary Rehab    Staff Present Birdie Sons, MPA, RN;Joseph Lou Miner, MS, ASCM CEP, Exercise Physiologist    Virtual Visit No    Medication changes reported     No    Fall or balance concerns reported    No    Warm-up and Cool-down Performed on first and last piece of equipment    Resistance Training Performed Yes    VAD Patient? No    PAD/SET Patient? No      Pain Assessment   Currently in Pain? No/denies                Social History   Tobacco Use  Smoking Status Former   Pack years: 0.00  Smokeless Tobacco Never  Tobacco Comments   Quit ~2007    Goals Met:  Independence with exercise equipment Exercise tolerated well No report of cardiac concerns or symptoms Strength training completed today  Goals Unmet:  Not Applicable  Comments: Pt able to follow exercise prescription today without complaint.  Will continue to monitor for progression.    Dr. Emily Filbert is Medical Director for [place].  Dr. Ottie Glazier is Medical Director for [facility] Pulmonary Rehabilitation.

## 2020-11-10 ENCOUNTER — Other Ambulatory Visit: Payer: Self-pay

## 2020-11-10 ENCOUNTER — Encounter: Payer: Medicare Other | Admitting: *Deleted

## 2020-11-10 DIAGNOSIS — Z951 Presence of aortocoronary bypass graft: Secondary | ICD-10-CM

## 2020-11-10 NOTE — Progress Notes (Signed)
Daily Session Note  Patient Details  Name: MANASES ETCHISON MRN: [id number] Date of Birth: 27-Aug-1960 Referring Provider:   Flowsheet Row Cardiac Rehab from 09/29/2020 in [facility] Cardiac and Pulmonary Rehab  Referring Provider [name]       Encounter Date: 11/10/2020  Check In:  Session Check In - 11/10/20 1348       Check-In   Supervising physician immediately available to respond to emergencies See telemetry face sheet for immediately available ER MD    Location [facility]-Cardiac & Pulmonary Rehab    Staff Present Renita Papa, RN BSN;Joseph [address] [place], [place], [place], CCRP, CCET    Virtual Visit No    Medication changes reported     No    Fall or balance concerns reported    No    Warm-up and Cool-down Performed on first and last piece of equipment    Resistance Training Performed Yes    VAD Patient? No    PAD/SET Patient? No      Pain Assessment   Currently in Pain? No/denies                Social History   Tobacco Use  Smoking Status Former   Pack years: 0.00  Smokeless Tobacco Never  Tobacco Comments   Quit ~2007    Goals Met:  Independence with exercise equipment Exercise tolerated well No report of cardiac concerns or symptoms Strength training completed today  Goals Unmet:  Not Applicable  Comments: Pt able to follow exercise prescription today without complaint.  Will continue to monitor for progression.    Dr. Emily Filbert is Medical Director for [place].  Dr. Ottie Glazier is Medical Director for [facility] Pulmonary Rehabilitation.

## 2020-11-14 ENCOUNTER — Other Ambulatory Visit: Payer: Self-pay

## 2020-11-14 DIAGNOSIS — Z951 Presence of aortocoronary bypass graft: Secondary | ICD-10-CM

## 2020-11-14 NOTE — Progress Notes (Signed)
Daily Session Note  Patient Details  Name: Lonnie Skinner MRN: [id number] Date of Birth: [birth year] Referring Provider:   Flowsheet Row Cardiac Rehab from 09/29/2020 in [facility] Cardiac and Pulmonary Rehab  Referring Provider [name]       Encounter Date: 11/14/2020  Check In:  Session Check In - 11/14/20 1349       Check-In   Supervising physician immediately available to respond to emergencies See telemetry face sheet for immediately available ER MD    Location [facility]-Cardiac & Pulmonary Rehab    Staff Present Birdie Sons, MPA, Mauricia Area, BS, ACSM CEP, Exercise Physiologist;Kara Eliezer Bottom, MS, ASCM CEP, Exercise Physiologist    Virtual Visit No    Medication changes reported     No    Fall or balance concerns reported    No    Warm-up and Cool-down Performed on first and last piece of equipment    Resistance Training Performed Yes    VAD Patient? No    PAD/SET Patient? No      Pain Assessment   Currently in Pain? No/denies                Social History   Tobacco Use  Smoking Status Former   Pack years: 0.00  Smokeless Tobacco Never  Tobacco Comments   Quit ~2007    Goals Met:  Independence with exercise equipment Exercise tolerated well No report of cardiac concerns or symptoms Strength training completed today  Goals Unmet:  Not Applicable  Comments: Pt able to follow exercise prescription today without complaint.  Will continue to monitor for progression.    Dr. Emily Filbert is Medical Director for [place].  Dr. Ottie Glazier is Medical Director for [facility] Pulmonary Rehabilitation.

## 2020-11-16 ENCOUNTER — Other Ambulatory Visit: Payer: Self-pay

## 2020-11-16 DIAGNOSIS — Z951 Presence of aortocoronary bypass graft: Secondary | ICD-10-CM

## 2020-11-16 NOTE — Progress Notes (Signed)
Daily Session Note  Patient Details  Name: Lonnie Skinner MRN: [id number] Date of Birth: 1960-05-25 Referring Provider:   Flowsheet Row Cardiac Rehab from 09/29/2020 in [facility] Cardiac and Pulmonary Rehab  Referring Provider [name]       Encounter Date: 11/16/2020  Check In:  Session Check In - 11/16/20 1446       Check-In   Supervising physician immediately available to respond to emergencies See telemetry face sheet for immediately available ER MD    Location [facility]-Cardiac & Pulmonary Rehab    Staff Present Birdie Sons, MPA, RN;Joseph Tessie Fass, RCP,RRT,BSRT;[place], RN Margurite Auerbach, MS, ASCM CEP, Exercise Physiologist;Kristen Coble, RN,BC,MSN    Virtual Visit No    Medication changes reported     No    Fall or balance concerns reported    No    Warm-up and Cool-down Performed on first and last piece of equipment    Resistance Training Performed Yes    VAD Patient? No    PAD/SET Patient? No      Pain Assessment   Currently in Pain? No/denies                Social History   Tobacco Use  Smoking Status Former   Pack years: 0.00  Smokeless Tobacco Never  Tobacco Comments   Quit ~2007    Goals Met:  Independence with exercise equipment Exercise tolerated well No report of cardiac concerns or symptoms Strength training completed today  Goals Unmet:  Not Applicable  Comments: Pt able to follow exercise prescription today without complaint.  Will continue to monitor for progression.    Dr. Emily Filbert is Medical Director for [place].  Dr. Ottie Glazier is Medical Director for [facility] Pulmonary Rehabilitation.

## 2020-11-17 ENCOUNTER — Other Ambulatory Visit: Payer: Self-pay

## 2020-11-17 ENCOUNTER — Encounter: Payer: Medicare Other | Admitting: *Deleted

## 2020-11-17 DIAGNOSIS — Z951 Presence of aortocoronary bypass graft: Secondary | ICD-10-CM | POA: Diagnosis not present

## 2020-11-17 NOTE — Progress Notes (Signed)
Daily Session Note  Patient Details  Name: Lonnie Skinner MRN: [id number] Date of Birth: 1960-12-31 Referring Provider:   Flowsheet Row Cardiac Rehab from 09/29/2020 in [facility] Cardiac and Pulmonary Rehab  Referring Provider [name]       Encounter Date: 11/17/2020  Check In:  Session Check In - 11/17/20 1531       Check-In   Supervising physician immediately available to respond to emergencies See telemetry face sheet for immediately available ER MD    Location [facility]-Cardiac & Pulmonary Rehab    Staff Present Renita Papa, RN BSN;Joseph [place], RCP,RRT,BSRT;Jessica [place], [place], RCEP, CCRP, CCET    Virtual Visit No    Medication changes reported     No    Fall or balance concerns reported    No    Warm-up and Cool-down Performed on first and last piece of equipment    Resistance Training Performed Yes    VAD Patient? No    PAD/SET Patient? No      Pain Assessment   Currently in Pain? No/denies                Social History   Tobacco Use  Smoking Status Former   Pack years: 0.00  Smokeless Tobacco Never  Tobacco Comments   Quit ~2007    Goals Met:  Independence with exercise equipment Exercise tolerated well No report of cardiac concerns or symptoms Strength training completed today  Goals Unmet:  Not Applicable  Comments: Pt able to follow exercise prescription today without complaint.  Will continue to monitor for progression.    Dr. Emily Filbert is Medical Director for [place].  Dr. Ottie Glazier is Medical Director for [facility] Pulmonary Rehabilitation.

## 2020-11-23 ENCOUNTER — Other Ambulatory Visit: Payer: Self-pay

## 2020-11-23 ENCOUNTER — Encounter: Payer: Medicare Other | Attending: Cardiology

## 2020-11-23 DIAGNOSIS — Z951 Presence of aortocoronary bypass graft: Secondary | ICD-10-CM | POA: Diagnosis not present

## 2020-11-23 NOTE — Progress Notes (Signed)
Daily Session Note  Patient Details  Name: Lonnie Skinner MRN: [id number] Date of Birth: 1961-04-22 Referring Provider:   Flowsheet Row Cardiac Rehab from 09/29/2020 in [facility] Cardiac and Pulmonary Rehab  Referring Provider Lonnie Skinner       Encounter Date: 11/23/2020  Check In:  Session Check In - 11/23/20 1338       Check-In   Supervising physician immediately available to respond to emergencies See telemetry face sheet for immediately available ER MD    Location [facility]-Cardiac & Pulmonary Rehab    Staff Present Birdie Sons, MPA, Nino Glow, MS, ASCM CEP, Exercise Physiologist;Joseph Tessie Fass, [place]    Virtual Visit No    Medication changes reported     No    Fall or balance concerns reported    No    Warm-up and Cool-down Performed on first and last piece of equipment    Resistance Training Performed Yes    VAD Patient? No    PAD/SET Patient? No      Pain Assessment   Currently in Pain? No/denies                Social History   Tobacco Use  Smoking Status Former   Pack years: 0.00  Smokeless Tobacco Never  Tobacco Comments   Quit ~2007    Goals Met:  Independence with exercise equipment Exercise tolerated well No report of cardiac concerns or symptoms Strength training completed today  Goals Unmet:  Not Applicable  Comments: Pt able to follow exercise prescription today without complaint.  Will continue to monitor for progression.    Dr. Emily Filbert is Medical Director for [place].  Dr. Ottie Glazier is Medical Director for [facility] Pulmonary Rehabilitation.

## 2020-11-24 ENCOUNTER — Encounter: Payer: Medicare Other | Admitting: *Deleted

## 2020-11-24 DIAGNOSIS — Z951 Presence of aortocoronary bypass graft: Secondary | ICD-10-CM

## 2020-11-24 NOTE — Progress Notes (Signed)
Daily Session Note  Patient Details  Name: Lonnie Skinner MRN: [id number] Date of Birth: 16-Sep-1960 Referring Provider:   Flowsheet Row Cardiac Rehab from 09/29/2020 in [facility] Cardiac and Pulmonary Rehab  Referring Provider [name]       Encounter Date: 11/24/2020  Check In:  Session Check In - 11/24/20 1328       Check-In   Supervising physician immediately available to respond to emergencies See telemetry face sheet for immediately available ER MD    Location [facility]-Cardiac & Pulmonary Rehab    Staff Present Renita Papa, RN BSN;Jessica Luan [place], MA, RCEP, CCRP, CCET;Melissa [place], RDN, Tawanna Solo, MS, ASCM CEP, Exercise Physiologist    Virtual Visit No    Medication changes reported     No    Fall or balance concerns reported    No    Warm-up and Cool-down Performed on first and last piece of equipment    Resistance Training Performed Yes    VAD Patient? No    PAD/SET Patient? No      Pain Assessment   Currently in Pain? No/denies                Social History   Tobacco Use  Smoking Status Former   Pack years: 0.00  Smokeless Tobacco Never  Tobacco Comments   Quit ~2007    Goals Met:  Independence with exercise equipment Exercise tolerated well No report of cardiac concerns or symptoms Strength training completed today  Goals Unmet:  Not Applicable  Comments: Pt able to follow exercise prescription today without complaint.  Will continue to monitor for progression.    Dr. Emily Filbert is Medical Director for [place].  Dr. Ottie Glazier is Medical Director for [facility] Pulmonary Rehabilitation.

## 2020-11-28 ENCOUNTER — Other Ambulatory Visit: Payer: Self-pay

## 2020-11-28 ENCOUNTER — Encounter: Payer: Medicare Other | Admitting: *Deleted

## 2020-11-28 DIAGNOSIS — Z951 Presence of aortocoronary bypass graft: Secondary | ICD-10-CM

## 2020-11-28 NOTE — Progress Notes (Signed)
Daily Session Note  Patient Details  Name: Lonnie Skinner MRN: [id number] Date of Birth: 09-30-1960 Referring Provider:   Flowsheet Row Cardiac Rehab from 09/29/2020 in [facility] Cardiac and Pulmonary Rehab  Referring Provider [name]       Encounter Date: 11/28/2020  Check In:  Session Check In - 11/28/20 1352       Check-In   Supervising physician immediately available to respond to emergencies See telemetry face sheet for immediately available ER MD    Location [facility]-Cardiac & Pulmonary Rehab    Staff Present Heath Lark, RN, BSN, Laveda Norman, BS, ACSM CEP, Exercise Physiologist;Kelly Rosalia Hammers, MPA, RN    Virtual Visit No    Medication changes reported     No    Fall or balance concerns reported    No    Warm-up and Cool-down Performed on first and last piece of equipment    Resistance Training Performed Yes    VAD Patient? No    PAD/SET Patient? No      VAD patient   Has back up controller? No      Pain Assessment   Currently in Pain? No/denies                Social History   Tobacco Use  Smoking Status Former   Pack years: 0.00  Smokeless Tobacco Never  Tobacco Comments   Quit ~2007    Goals Met:  Independence with exercise equipment Exercise tolerated well No report of cardiac concerns or symptoms  Goals Unmet:  Not Applicable  Comments: Pt able to follow exercise prescription today without complaint.  Will continue to monitor for progression.    Dr. Emily Filbert is Medical Director for [place].  Dr. Ottie Glazier is Medical Director for [facility] Pulmonary Rehabilitation.

## 2020-11-30 ENCOUNTER — Encounter: Payer: Self-pay | Admitting: *Deleted

## 2020-11-30 ENCOUNTER — Other Ambulatory Visit: Payer: Self-pay

## 2020-11-30 ENCOUNTER — Encounter: Payer: Medicare Other | Admitting: *Deleted

## 2020-11-30 DIAGNOSIS — Z951 Presence of aortocoronary bypass graft: Secondary | ICD-10-CM

## 2020-11-30 NOTE — Progress Notes (Signed)
Cardiac Individual Treatment Plan  Patient Details  Name: Lonnie Skinner MRN: [id number] Date of Birth: 09/04/1960 Referring Provider:   Flowsheet Row Cardiac Rehab from 09/29/2020 in [facility] Cardiac and Pulmonary Rehab  Referring Provider Lonnie Skinner       Initial Encounter Date:  Flowsheet Row Cardiac Rehab from 09/29/2020 in [facility] Cardiac and Pulmonary Rehab  Date 09/29/20       Visit Diagnosis: S/P CABG x 4  Patient's Home Medications on Admission:  Current Outpatient Medications:    acetaminophen (TYLENOL) 325 MG tablet, Take 2 tablets (650 mg total) by mouth every 6 (six) hours as needed for mild pain, headache or fever., Disp: , Rfl:    ALPRAZolam (XANAX) 0.25 MG tablet, TAKE 1 TABLET(0.25 MG) BY MOUTH TWICE DAILY AS NEEDED FOR ANXIETY, Disp: 60 tablet, Rfl: 0   aspirin 81 MG EC tablet, Take 1 tablet (81 mg total) by mouth daily., Disp: 30 tablet, Rfl:    atorvastatin (LIPITOR) 80 MG tablet, Take 1 tablet (80 mg total) by mouth every evening., Disp: 30 tablet, Rfl: 3   carvedilol (COREG) 3.125 MG tablet, Take 1 tablet (3.125 mg total) by mouth 2 (two) times daily., Disp: 60 tablet, Rfl: 3   clopidogrel (PLAVIX) 75 MG tablet, Take 1 tablet (75 mg total) by mouth daily., Disp: 90 tablet, Rfl: 0   furosemide (LASIX) 40 MG tablet, Take 1 tablet (40 mg total) by mouth daily., Disp: 90 tablet, Rfl: 1   nitroGLYCERIN (NITROSTAT) 0.4 MG SL tablet, Place 1 tablet (0.4 mg total) under the tongue every 5 (five) minutes as needed for chest pain (up to 3 doses)., Disp: 25 tablet, Rfl: 3   traMADol (ULTRAM) 50 MG tablet, Take 1 tablet (50 mg total) by mouth every 4 (four) hours as needed for moderate pain. (Patient not taking: Reported on 09/23/2020), Disp: 30 tablet, Rfl: 0  Past Medical History: Past Medical History:  Diagnosis Date   CAD (coronary artery disease)    a. 05/2013: NSTEMI s/p DES to mRCA (culprit vessel), residual moderate LAD disease (the patient fails medical therapy and this was  found to be significant, this would be amenable to PCI).   CHF (congestive heart failure) (HCC)    CKD (chronic kidney disease), stage II    DVT (deep venous thrombosis) ([place]) 1981   Hypercholesteremia    Hyperglycemia    Hypertension     Tobacco Use: Social History   Tobacco Use  Smoking Status Former   Pack years: 0.00  Smokeless Tobacco Never  Tobacco Comments   Quit ~2007    Labs: Recent Review Flowsheet Data     Labs for ITP Cardiac and Pulmonary Rehab Latest Ref Rng & Units 08/09/2020 08/09/2020 08/09/2020 08/09/2020 08/09/2020   Cholestrol 100 - 199 mg/dL - - - - -   LDLCALC 0 - 99 mg/dL - - - - -   HDL >39 mg/dL - - - - -   Trlycerides 0 - 149 mg/dL - - - - -   Hemoglobin A1c 4.8 - 5.6 % - - - - -   PHART 7.350 - 7.450 - 7.407 7.358 7.406 7.332(L)   PCO2ART 32.0 - 48.0 mmHg - 40.1 27.2(L) 37.2 46.8   HCO3 20.0 - 28.0 mmol/L - 25.4 15.4(L) 23.5 24.8   TCO2 22 - 32 mmol/L 24 27 16(L) 25 26   ACIDBASEDEF 0.0 - 2.0 mmol/L - - 9.0(H) 1.0 1.0   O2SAT % - 100.0 99.0 99.0 99.0  Exercise Target Goals: Exercise Program Goal: Individual exercise prescription set using results from initial 6 min walk test and THRR while considering  patient's activity barriers and safety.   Exercise Prescription Goal: Initial exercise prescription builds to 30-45 minutes a day of aerobic activity, 2-3 days per week.  Home exercise guidelines will be given to patient during program as part of exercise prescription that the participant will acknowledge.   Education: Aerobic Exercise: - Group verbal and visual presentation on the components of exercise prescription. Introduces F.I.T.T principle from ACSM for exercise prescriptions.  Reviews F.I.T.T. principles of aerobic exercise including progression. Written material given at graduation. Flowsheet Row Cardiac Rehab from 11/23/2020 in [facility] Cardiac and Pulmonary Rehab  Date 11/09/20  Educator AS  Instruction Review Code 1- Verbalizes  Understanding       Education: Resistance Exercise: - Group verbal and visual presentation on the components of exercise prescription. Introduces F.I.T.T principle from ACSM for exercise prescriptions  Reviews F.I.T.T. principles of resistance exercise including progression. Written material given at graduation. Flowsheet Row Cardiac Rehab from 11/23/2020 in [facility] Cardiac and Pulmonary Rehab  Date 11/16/20  Educator [place]  Instruction Review Code 1- Verbalizes Understanding        Education: Exercise & Equipment Safety: - Individual verbal instruction and demonstration of equipment use and safety with use of the equipment. Flowsheet Row Cardiac Rehab from 11/23/2020 in [facility] Cardiac and Pulmonary Rehab  Date 09/29/20  Educator AS  Instruction Review Code 1- Verbalizes Understanding       Education: Exercise Physiology & General Exercise Guidelines: - Group verbal and written instruction with models to review the exercise physiology of the cardiovascular system and associated critical values. Provides general exercise guidelines with specific guidelines to those with heart or lung disease.    Education: Flexibility, Balance, Mind/Body Relaxation: - Group verbal and visual presentation with interactive activity on the components of exercise prescription. Introduces F.I.T.T principle from ACSM for exercise prescriptions. Reviews F.I.T.T. principles of flexibility and balance exercise training including progression. Also discusses the mind body connection.  Reviews various relaxation techniques to help reduce and manage stress (i.e. Deep breathing, progressive muscle relaxation, and visualization). Balance handout provided to take home. Written material given at graduation. Flowsheet Row Cardiac Rehab from 11/23/2020 in [facility] Cardiac and Pulmonary Rehab  Date 11/23/20  Educator [facility]  Instruction Review Code 1- Verbalizes Understanding       Activity Barriers & Risk Stratification:  Activity  Barriers & Cardiac Risk Stratification - 09/28/20 1535       Activity Barriers & Cardiac Risk Stratification   Activity Barriers Joint Problems   Both knees have had surgery.   Cardiac Risk Stratification High             6 Minute Walk:  6 Minute Walk     Row Name 09/29/20 1535         6 Minute Walk   Phase Initial     Distance 1300 feet     Walk Time 6 minutes     # of Rest Breaks 0     MPH 2.46     METS 3.45     RPE 11     Perceived Dyspnea  1     VO2 Peak 12.08     Symptoms No     Resting HR 84 bpm     Resting BP 128/84     Resting Oxygen Saturation  99 %     Exercise Oxygen Saturation  during 6  min walk 99 %     Max Ex. HR 102 bpm     Max Ex. BP 146/78     2 Minute Post BP 114/68              Oxygen Initial Assessment:   Oxygen Re-Evaluation:   Oxygen Discharge (Final Oxygen Re-Evaluation):   Initial Exercise Prescription:  Initial Exercise Prescription - 09/29/20 1500       Date of Initial Exercise RX and Referring Provider   Date 09/29/20    Referring Provider Lonnie Skinner      Treadmill   MPH 2.4    Grade 1.5    Minutes 15    METs 3.3      NuStep   Level 3    SPM 80    Minutes 15    METs 3.45      Biostep-RELP   Level 3    SPM 50    Minutes 15    METs 3      Track   Laps 45    Minutes 15    METs 3.4      Prescription Details   Frequency (times per week) 3    Duration Progress to 30 minutes of continuous aerobic without signs/symptoms of physical distress      Intensity   THRR 40-80% of Max Heartrate 114-145    Ratings of Perceived Exertion 11-13    Perceived Dyspnea 0-4      Resistance Training   Training Prescription Yes    Weight 3 lb    Reps 10-15             Perform Capillary Blood Glucose checks as needed.  Exercise Prescription Changes:   Exercise Prescription Changes     Row Name 09/29/20 1500 10/13/20 0900 10/26/20 1600 11/09/20 1000 11/24/20 1800     Response to Exercise   Blood Pressure  (Admit) 128/84 122/80 132/82 134/72 122/76   Blood Pressure (Exercise) 146/78 140/86 124/62 -- --   Blood Pressure (Exit) 114/68 130/78 122/84 122/80 108/64   Heart Rate (Admit) 84 bpm 92 bpm 97 bpm 96 bpm 78 bpm   Heart Rate (Exercise) 102 bpm 110 bpm 106 bpm 110 bpm 93 bpm   Heart Rate (Exit) 84 bpm 93 bpm 88 bpm 92 bpm 82 bpm   Oxygen Saturation (Admit) 99 % -- -- -- --   Oxygen Saturation (Exercise) 99 % -- -- -- --   Rating of Perceived Exertion (Exercise) _0 Perceived Dyspnea (Exercise) 1 -- -- -- --   Symptoms _1    Duration -- Progress to 30 minutes of  aerobic without signs/symptoms of physical distress Progress to 30 minutes of  aerobic without signs/symptoms of physical distress Continue with 30 min of aerobic exercise without signs/symptoms of physical distress. Continue with 30 min of aerobic exercise without signs/symptoms of physical distress.   Intensity -- THRR unchanged THRR unchanged THRR unchanged THRR unchanged     Progression   Progression -- Continue to progress workloads to maintain intensity without signs/symptoms of physical distress. Continue to progress workloads to maintain intensity without signs/symptoms of physical distress. Continue to progress workloads to maintain intensity without signs/symptoms of physical distress. Continue to progress workloads to maintain intensity without signs/symptoms of physical distress.   Average METs -- 2.67 2.9 2.57 3.06     Resistance Training   Training Prescription -- Yes Yes Yes Yes   Weight -- 4 lb 4  lb 4 lb 5 lb   Reps -- 10-15 10-15 10-15 10-15     Interval Training   Interval Training -- No No No No     NuStep   Level -- 3 -- -- 4   Minutes -- 15 -- -- 15   METs -- 2.6 -- -- 3.3     Arm Ergometer   Level -- -- 1 1 5.2   Minutes -- -- _0 METs -- -- 2.4 2.1 4.2     T5 Nustep   Level -- -- -- 4 --   Minutes -- -- -- 15 --   METs -- -- -- 2.4 --     Biostep-RELP    Level -- 3 -- -- 3   Minutes -- 15 -- -- 15   METs -- 3 -- -- 2     Track   Laps -- 30 45 40 47   Minutes -- _1 --   METs -- 2.6 3.4 3.2 --            Exercise Comments:   Exercise Comments     Row Name 10/03/20 1342           Exercise Comments First full day of exercise!  Patient was oriented to gym and equipment including functions, settings, policies, and procedures.  Patient's individual exercise prescription and treatment plan were reviewed.  All starting workloads were established based on the results of the 6 minute walk test done at initial orientation visit.  The plan for exercise progression was also introduced and progression will be customized based on patient's performance and goals.                Exercise Goals and Review:   Exercise Goals     Row Name 09/29/20 1547             Exercise Goals   Increase Physical Activity Yes       Intervention Provide advice, education, support and counseling about physical activity/exercise needs.;Develop an individualized exercise prescription for aerobic and resistive training based on initial evaluation findings, risk stratification, comorbidities and participant's personal goals.       Expected Outcomes Short Term: Attend rehab on a regular basis to increase amount of physical activity.;Long Term: Exercising regularly at least 3-5 days a week.       Increase Strength and Stamina Yes       Intervention Provide advice, education, support and counseling about physical activity/exercise needs.;Develop an individualized exercise prescription for aerobic and resistive training based on initial evaluation findings, risk stratification, comorbidities and participant's personal goals.       Expected Outcomes Short Term: Increase workloads from initial exercise prescription for resistance, speed, and METs.;Short Term: Perform resistance training exercises routinely during rehab and add in resistance training at  home;Long Term: Improve cardiorespiratory fitness, muscular endurance and strength as measured by increased METs and functional capacity (6MWT)       Able to understand and use rate of perceived exertion (RPE) scale Yes       Intervention Provide education and explanation on how to use RPE scale       Expected Outcomes Short Term: Able to use RPE daily in rehab to express subjective intensity level;Long Term:  Able to use RPE to guide intensity level when exercising independently       Able to understand and use Dyspnea scale Yes       Intervention Provide education and  explanation on how to use Dyspnea scale       Expected Outcomes Short Term: Able to use Dyspnea scale daily in rehab to express subjective sense of shortness of breath during exertion;Long Term: Able to use Dyspnea scale to guide intensity level when exercising independently       Knowledge and understanding of Target Heart Rate Range (THRR) Yes       Intervention Provide education and explanation of THRR including how the numbers were predicted and where they are located for reference       Expected Outcomes Short Term: Able to state/look up THRR;Long Term: Able to use THRR to govern intensity when exercising independently       Able to check pulse independently Yes       Intervention Provide education and demonstration on how to check pulse in carotid and radial arteries.;Review the importance of being able to check your own pulse for safety during independent exercise       Expected Outcomes Short Term: Able to explain why pulse checking is important during independent exercise;Long Term: Able to check pulse independently and accurately       Understanding of Exercise Prescription Yes       Intervention Provide education, explanation, and written materials on patient's individual exercise prescription       Expected Outcomes Short Term: Able to explain program exercise prescription;Long Term: Able to explain home exercise  prescription to exercise independently                Exercise Goals Re-Evaluation :  Exercise Goals Re-Evaluation     Row Name 10/03/20 1343 10/13/20 0927 10/26/20 1340 11/09/20 1002 11/17/20 1602     Exercise Goal Re-Evaluation   Exercise Goals Review Able to understand and use rate of perceived exertion (RPE) scale;Knowledge and understanding of Target Heart Rate Range (THRR);Increase Physical Activity;Understanding of Exercise Prescription;Increase Strength and Stamina;Able to understand and use Dyspnea scale;Able to check pulse independently Increase Physical Activity;Increase Strength and Stamina;Understanding of Exercise Prescription Increase Physical Activity;Increase Strength and Stamina Increase Physical Activity;Increase Strength and Stamina;Understanding of Exercise Prescription Increase Physical Activity;Increase Strength and Stamina   Comments Reviewed RPE and dyspnea scales, THR and program prescription with pt today.  Pt voiced understanding and was given a copy of goals to take home. Lonnie Skinner is off to a good start in rehab.  He does have some difficulty remember what to do each day, but once we get him going he does well on the machines. He is up to 2.5 METs on the arm crank.  We will continue to monitor his progress. Informed Lonnie Skinner about the [facility] and what he wants to do when he completes the program. Explained to him that it would be benificial for his health to have a plan when he is dome with rehab. Lonnie Skinner is doing well in rehab.  He is up to 40 laps on the track and level 4 on T5 NuStep.  We will need to make sure that he is maintaining his SPM on T5.  We will continue to monitor his progress. Lonnie Skinner feels he has more energy since he has started to exercise.  He has moved up to 5 lb weights for strength.  He walks his driveway sometimes at home.   Expected Outcomes Short: Use RPE daily to regulate intensity. Long: Follow program prescription in THR. Short: Continue to improve  workloads Long: Continue to improve stamina Short: continue exercising and add days at home to workout. Long: join  a gym after Heart[facility]. Short: Continue to increase workloads Long: Continue to improve stamina --    Row Name 11/17/20 1604 11/24/20 1841           Exercise Goal Re-Evaluation   Exercise Goals Review -- Increase Physical Activity;Increase Strength and Stamina      Comments Reviewed home exercise with pt today.  Pt plans to walk/consider [facility] for exercise.  Reviewed THR, pulse, RPE, sign and symptoms, pulse oximetery and when to call 911 or MD.  Also discussed weather considerations and indoor options.  Pt voiced understanding. Lonnie Skinner continues to do well in rehab. He has increased his number of laps on the track to 47 which hits him over 1 mile! RPE stays in appropriate range. Will continue to monitor.      Expected Outcomes Short: monitor RPE when exrecising at home Long: continue to build stamina Short: Continue to increase handweights Long: Increase overall MET level               Discharge Exercise Prescription (Final Exercise Prescription Changes):  Exercise Prescription Changes - 11/24/20 1800       Response to Exercise   Blood Pressure (Admit) 122/76    Blood Pressure (Exit) 108/64    Heart Rate (Admit) 78 bpm    Heart Rate (Exercise) 93 bpm    Heart Rate (Exit) 82 bpm    Rating of Perceived Exertion (Exercise) 12    Symptoms none    Duration Continue with 30 min of aerobic exercise without signs/symptoms of physical distress.    Intensity THRR unchanged      Progression   Progression Continue to progress workloads to maintain intensity without signs/symptoms of physical distress.    Average METs 3.06      Resistance Training   Training Prescription Yes    Weight 5 lb    Reps 10-15      Interval Training   Interval Training No      NuStep   Level 4    Minutes 15    METs 3.3      Arm Ergometer   Level 5.2    Minutes 15    METs 4.2       Biostep-RELP   Level 3    Minutes 15    METs 2      Track   Laps 47             Nutrition:  Target Goals: Understanding of nutrition guidelines, daily intake of sodium <153m, cholesterol <2042m calories 30% from fat and 7% or less from saturated fats, daily to have 5 or more servings of fruits and vegetables.  Education: All About Nutrition: -Group instruction provided by verbal, written material, interactive activities, discussions, models, and posters to present general guidelines for heart healthy nutrition including fat, fiber, MyPlate, the role of sodium in heart healthy nutrition, utilization of the nutrition label, and utilization of this knowledge for meal planning. Follow up email sent as well. Written material given at graduation.   Biometrics:  Pre Biometrics - 09/29/20 1600       Pre Biometrics   Height 5' 8" (1.727 m)    Weight 196 lb 9.6 oz (89.2 kg)    BMI (Calculated) 29.9    Single Leg Stand 12.57 seconds              Nutrition Therapy Plan and Nutrition Goals:  Nutrition Therapy & Goals - 10/10/20 1432       Nutrition Therapy  Diet heart healthy, low Na    Drug/Food Interactions Statins/Certain Fruits    Protein (specify units) 70g    Fiber 30 grams    Whole Grain Foods 3 servings    Saturated Fats 12 max. grams    Fruits and Vegetables 8 servings/day    Sodium 1.5 grams      Personal Nutrition Goals   Nutrition Goal ST:LT: answer any questions patient has    Comments he doesn't eat too much in the morning - [employer] breakfast shakes.Late afternoon: chicken, mac and cheese, and lima beans. sloppy joes (hasnt had in a while), he likes vegetables and greens, beans, salads, subway sandwiches, K and W, pizza. Snack: veggie straws (ranch) or ice cream or sherbert. Unable to get a good view of his diet day to day aside from breakfast which is consistent. Reviewed heart healthy eating basics including MyPlate, salt, and fats.  Answered questions  about specific foods that patient had. Will continue to follow up with patient. Provided handout.      Intervention Plan   Intervention Prescribe, educate and counsel regarding individualized specific dietary modifications aiming towards targeted core components such as weight, hypertension, lipid management, diabetes, heart failure and other comorbidities.;Nutrition handout(s) given to patient.    Expected Outcomes Short Term Goal: Understand basic principles of dietary content, such as calories, fat, sodium, cholesterol and nutrients.;Short Term Goal: A plan has been developed with personal nutrition goals set during dietitian appointment.;Long Term Goal: Adherence to prescribed nutrition plan.             Nutrition Assessments:  MEDIFICTS Score Key: ?70 Need to make dietary changes  40-70 Heart Healthy Diet ? 40 Therapeutic Level Cholesterol Diet  Flowsheet Row Cardiac Rehab from 10/10/2020 in [facility] Cardiac and Pulmonary Rehab  Picture Your Plate Total Score on Admission 56      Picture Your Plate Scores: <88 Unhealthy dietary pattern with much room for improvement. 41-50 Dietary pattern unlikely to meet recommendations for good health and room for improvement. 51-60 More healthful dietary pattern, with some room for improvement.  >60 Healthy dietary pattern, although there may be some specific behaviors that could be improved.    Nutrition Goals Re-Evaluation:  Nutrition Goals Re-Evaluation     [place] Name 10/26/20 1348 11/17/20 1556           Goals   Current Weight 203 lb (92.1 kg) --      Nutrition Goal Watch what he is eating more closely. Lonnie Skinner eats when he is hungry.  He says he tried not to eat fried foods.  Review information given by RD to improve diet      Comment Jaxtyn states that he does not eat breakfast all the time and has had a shake today only. Informed him that he may need more food in order for him to have energy and lose weight.Informed him to look at  labels and watch sodium intake. --      Expected Outcome Short: read more nutrition labels. Long: maintian a lower sodium diet. Short: review info from RD Long: develop heart healthy diet               Nutrition Goals Discharge (Final Nutrition Goals Re-Evaluation):  Nutrition Goals Re-Evaluation - 11/17/20 1556       Goals   Nutrition Goal Lonnie Skinner eats when he is hungry.  He says he tried not to eat fried foods.  Review information given by RD to improve diet    Expected  Outcome Short: review info from RD Long: develop heart healthy diet             Psychosocial: Target Goals: Acknowledge presence or absence of significant depression and/or stress, maximize coping skills, provide positive support system. Participant is able to verbalize types and ability to use techniques and skills needed for reducing stress and depression.   Education: Stress, Anxiety, and Depression - Group verbal and visual presentation to define topics covered.  Reviews how body is impacted by stress, anxiety, and depression.  Also discusses healthy ways to reduce stress and to treat/manage anxiety and depression.  Written material given at graduation. Flowsheet Row Cardiac Rehab from 11/23/2020 in [facility] Cardiac and Pulmonary Rehab  Date 10/26/20  Educator AS  Instruction Review Code 1- Verbalizes Understanding       Education: Sleep Hygiene -Provides group verbal and written instruction about how sleep can affect your health.  Define sleep hygiene, discuss sleep cycles and impact of sleep habits. Review good sleep hygiene tips.    Initial Review & Psychosocial Screening:  Initial Psych Review & Screening - 09/28/20 1549       Initial Review   Current issues with Current Stress Concerns    Source of Stress Concerns Family    Comments By himslf now- since wife passed away. Dealing with lots of changes, trying to cope.      Family Dynamics   Good Support System? Yes   son and daughter             Quality of Life Scores:   Quality of Life - 09/29/20 1549       Quality of Life   Select Quality of Life      Quality of Life Scores   Health/Function Pre 16.73 %    Socioeconomic Pre 12.75 %    Psych/Spiritual Pre 14.4 %    Family Pre 15.5 %    GLOBAL Pre 15.63 %            Scores of 19 and below usually indicate a poorer quality of life in these areas.  A difference of  2-3 points is a clinically meaningful difference.  A difference of 2-3 points in the total score of the Quality of Life Index has been associated with significant improvement in overall quality of life, self-image, physical symptoms, and general health in studies assessing change in quality of life.  PHQ-9: Recent Review Flowsheet Data     Depression screen [facility] 2/9 09/29/2020 09/14/2020 03/09/2020 04/22/2019 06/02/2018   Decreased Interest 1 0 0 0 0   Down, Depressed, Hopeless 1 2 0 0 0   PHQ - 2 Score 2 2 0 0 0   Altered sleeping 0 0 0 - -   Tired, decreased energy 1 1 0 - -   Change in appetite 0 0 0 - -   Feeling bad or failure about yourself  1 1 0 - -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

## 2020-11-30 NOTE — Progress Notes (Signed)
Daily Session Note  Patient Details  Name: Lonnie Skinner MRN: [id number] Date of Birth: 1960/08/23 Referring Provider:   Flowsheet Row Cardiac Rehab from 09/29/2020 in [facility] Cardiac and Pulmonary Rehab  Referring Provider [name]       Encounter Date: 11/30/2020  Check In:  Session Check In - 11/30/20 1351       Check-In   Supervising physician immediately available to respond to emergencies See telemetry face sheet for immediately available ER MD    Location [facility]-Cardiac & Pulmonary Rehab    Staff Present Heath Lark, RN, BSN, [employer], MPA, Lonnie Glow, MS, ASCM CEP, Exercise Physiologist    Virtual Visit No    Medication changes reported     No    Fall or balance concerns reported    No    Warm-up and Cool-down Performed on first and last piece of equipment    Resistance Training Performed Yes    VAD Patient? No    PAD/SET Patient? No      VAD patient   Has back up controller? No      Pain Assessment   Currently in Pain? No/denies                Social History   Tobacco Use  Smoking Status Former   Pack years: 0.00  Smokeless Tobacco Never  Tobacco Comments   Quit ~2007    Goals Met:  Independence with exercise equipment Exercise tolerated well No report of cardiac concerns or symptoms  Goals Unmet:  Not Applicable  Comments: Pt able to follow exercise prescription today without complaint.  Will continue to monitor for progression.    Dr. Emily Filbert is Medical Director for [place].  Dr. Ottie Glazier is Medical Director for [facility] Pulmonary Rehabilitation.

## 2020-12-01 ENCOUNTER — Encounter: Payer: Medicare Other | Admitting: *Deleted

## 2020-12-01 ENCOUNTER — Other Ambulatory Visit: Payer: Self-pay

## 2020-12-01 DIAGNOSIS — Z951 Presence of aortocoronary bypass graft: Secondary | ICD-10-CM | POA: Diagnosis not present

## 2020-12-01 NOTE — Progress Notes (Signed)
Daily Session Note  Patient Details  Name: Lonnie Skinner MRN: [id number] Date of Birth: [birth year] Referring Provider:   Flowsheet Row Cardiac Rehab from 09/29/2020 in [facility] Cardiac and Pulmonary Rehab  Referring Provider [name]       Encounter Date: 12/01/2020  Check In:  Session Check In - 12/01/20 1327       Check-In   Supervising physician immediately available to respond to emergencies See telemetry face sheet for immediately available ER MD    Location [facility]-Cardiac & Pulmonary Rehab    Staff Present Renita Papa, RN BSN;Joseph Paoli, RCP,RRT,BSRT;Jessica [place], [place], RCEP, CCRP, CCET    Virtual Visit No    Medication changes reported     No    Fall or balance concerns reported    No    Warm-up and Cool-down Performed on first and last piece of equipment    Resistance Training Performed Yes    VAD Patient? No    PAD/SET Patient? No      Pain Assessment   Currently in Pain? No/denies                Social History   Tobacco Use  Smoking Status Former  Smokeless Tobacco Never  Tobacco Comments   Quit ~2007    Goals Met:  Independence with exercise equipment Exercise tolerated well No report of cardiac concerns or symptoms Strength training completed today  Goals Unmet:  Not Applicable  Comments: Pt able to follow exercise prescription today without complaint.  Will continue to monitor for progression.    Dr. Emily Filbert is Medical Director for [place].  Dr. Ottie Glazier is Medical Director for [facility] Pulmonary Rehabilitation.

## 2020-12-05 ENCOUNTER — Other Ambulatory Visit: Payer: Self-pay

## 2020-12-05 DIAGNOSIS — Z951 Presence of aortocoronary bypass graft: Secondary | ICD-10-CM | POA: Diagnosis not present

## 2020-12-05 NOTE — Progress Notes (Signed)
Daily Session Note  Patient Details  Name: Lonnie Skinner MRN: [id number] Date of Birth: [birth year] Referring Provider:   Flowsheet Row Cardiac Rehab from 09/29/2020 in [facility] Cardiac and Pulmonary Rehab  Referring Provider [name]       Encounter Date: 12/05/2020  Check In:  Session Check In - 12/05/20 1411       Check-In   Supervising physician immediately available to respond to emergencies See telemetry face sheet for immediately available ER MD    Location [facility]-Cardiac & Pulmonary Rehab    Staff Present Birdie Sons, MPA, Mauricia Area, BS, ACSM CEP, Exercise Physiologist;Joseph Tessie Fass, [place]    Virtual Visit No    Medication changes reported     No    Fall or balance concerns reported    No    Warm-up and Cool-down Performed on first and last piece of equipment    Resistance Training Performed Yes    VAD Patient? No    PAD/SET Patient? No      Pain Assessment   Currently in Pain? No/denies                Social History   Tobacco Use  Smoking Status Former  Smokeless Tobacco Never  Tobacco Comments   Quit ~2007    Goals Met:  Independence with exercise equipment Exercise tolerated well No report of cardiac concerns or symptoms Strength training completed today  Goals Unmet:  Not Applicable  Comments: Pt able to follow exercise prescription today without complaint.  Will continue to monitor for progression.    Dr. Emily Filbert is Medical Director for [place].  Dr. Ottie Glazier is Medical Director for [facility] Pulmonary Rehabilitation.

## 2020-12-05 NOTE — Progress Notes (Signed)
Cardiology Office Note:    Date:  12/07/2020   ID:  Lonnie Skinner, DOB Apr 13, 1961, MRN [id number]  PCP:  Alen Bleacher, MD   [place]  Cardiologist:  Freada Bergeron, MD  Advanced Practice Provider:  No care team member to display Electrophysiologist:  None    Referring MD: Cleophas Dunker, *    History of Present Illness:    Lonnie Skinner is a 60 y.o. male with a hx of CAD/NSTEMI s/p DES to [facility] with residual moderate LAD disease in [id number], chronic diastolic heart failure, HTN, HLD, CKD who was previously followed seen by Dr. [name] who now returns to clinic for follow-up.  Admitted back in early 2015 with CAD/NSTEMI - s/p DES to the mRCA, residual moderate LAD disease (if fails on medical therapy would proceed with PCI). He has typically not followed CV risk factor modification.   Was seen by me in clinic on 07/27/20 where he was having chest pain on exertion. Cath 08/02/20 showed critical 95% prox LAD, 70% OM1, 60% OM2, 70% prox RCA with abnormal FFR. He subsequently underwent CABG 07/2020 with LIMA-LAD, S-PDA, S-OM1, S-OM2 with Dr. Kipp Brood. TTE 08/12/20 with LVEF 60-65%, no WMA.   Last seen by Richardson Dopp on 08/29/20 where he was recovering from surgery. No changes in meds at that time.  Today, the patient states that he is overall feeling well. Has recovered well from his surgery. Enrolled in cardiac rehab which he is enjoying. Has been taking classes for heart healthy diet as well. Plans to join [employer] afterwards. No exertional chest pain or significant shortness of breath. Blood pressure is well controlled. Tolerating medications as prescribed.    Past Medical History:  Diagnosis Date   CAD (coronary artery disease)    a. 05/2013: NSTEMI s/p DES to mRCA (culprit vessel), residual moderate LAD disease (the patient fails medical therapy and this was found to be significant, this would be amenable to PCI).   CHF (congestive heart failure)  (HCC)    CKD (chronic kidney disease), stage II    DVT (deep venous thrombosis) ([place]) 1981   Hypercholesteremia    Hyperglycemia    Hypertension     Past Surgical History:  Procedure Laterality Date   CORONARY ARTERY BYPASS GRAFT N/A 08/09/2020   Procedure: CORONARY ARTERY BYPASS GRAFTING (CABG) X  FOUR   USING  LEFT INTERNAL MAMMARY ARTERY HARVEST AND RIGHT AND LEFT ENDOSCOPIC SAPHENOUS VEIN HARVEST;  Surgeon: [name], MD;  Location: [place];  Service: Open Heart Surgery;  Laterality: N/A;   CORONARY STENT PLACEMENT  05/2013   mRCA   CORONARY/GRAFT ACUTE MI REVASCULARIZATION  2015   INTRAVASCULAR PRESSURE WIRE/FFR STUDY N/A 08/02/2020   Procedure: INTRAVASCULAR PRESSURE WIRE/FFR STUDY;  Surgeon: [name], [name], MD;  Location: [place] CV [facility];  Service: Cardiovascular;  Laterality: N/A;   KNEE SURGERY Left 2009   LEFT HEART CATH AND CORONARY ANGIOGRAPHY N/A 08/02/2020   Procedure: LEFT HEART CATH AND CORONARY ANGIOGRAPHY;  Surgeon: [name], [name], MD;  Location: [place] CV [facility];  Service: Cardiovascular;  Laterality: N/A;   LEFT HEART CATHETERIZATION WITH CORONARY ANGIOGRAM N/A 06/02/2013   Procedure: LEFT HEART CATHETERIZATION WITH CORONARY ANGIOGRAM;  Surgeon: [name], MD;  Location: [facility] CATH LAB;  Service: Cardiovascular;  Laterality: N/A;   PERCUTANEOUS CORONARY STENT INTERVENTION (PCI-S)  06/02/2013   Procedure: PERCUTANEOUS CORONARY STENT INTERVENTION (PCI-S);  Surgeon: [name], MD;  Location: [facility] CATH LAB;  Service: Cardiovascular;;   TEE WITHOUT CARDIOVERSION N/A 08/09/2020   Procedure: TRANSESOPHAGEAL ECHOCARDIOGRAM (TEE);  Surgeon: [name], MD;  Location: [place];  Service: Open Heart Surgery;  Laterality: N/A;    Current Medications: Current Meds  Medication Sig   acetaminophen (TYLENOL) 325 MG tablet Take 2 tablets (650 mg total) by mouth every 6 (six) hours as needed for mild pain, headache or fever.   ALPRAZolam (XANAX) 0.25  MG tablet TAKE 1 TABLET(0.25 MG) BY MOUTH TWICE DAILY AS NEEDED FOR ANXIETY   aspirin 81 MG EC tablet Take 1 tablet (81 mg total) by mouth daily.   nitroGLYCERIN (NITROSTAT) 0.4 MG SL tablet Place 1 tablet (0.4 mg total) under the tongue every 5 (five) minutes as needed for chest pain (up to 3 doses).   traMADol (ULTRAM) 50 MG tablet Take 1 tablet (50 mg total) by mouth every 4 (four) hours as needed for moderate pain.   [DISCONTINUED] atorvastatin (LIPITOR) 80 MG tablet Take 1 tablet (80 mg total) by mouth every evening.   [DISCONTINUED] carvedilol (COREG) 3.125 MG tablet Take 1 tablet (3.125 mg total) by mouth 2 (two) times daily.   [DISCONTINUED] clopidogrel (PLAVIX) 75 MG tablet Take 1 tablet (75 mg total) by mouth daily.   [DISCONTINUED] furosemide (LASIX) 40 MG tablet Take 1 tablet (40 mg total) by mouth daily.     Allergies:   Penicillins   Social History   Socioeconomic History   Marital status: Widowed    Spouse name: Not on file   Number of children: Not on file   Years of education: Not on file   Highest education level: Not on file  Occupational History    Comment: Disabled  Tobacco Use   Smoking status: Former   Smokeless tobacco: Never   Tobacco comments:    Quit ~2007  Vaping Use   Vaping Use: Never used  Substance and Sexual Activity   Alcohol use: No   Drug use: No   Sexual activity: Not Currently  Other Topics Concern   Not on file  Social History Narrative   Not on file   Social Determinants of Health   Financial Resource Strain: Not on file  Food Insecurity: Not on file  Transportation Needs: Not on file  Physical Activity: Not on file  Stress: Not on file  Social Connections: Not on file     Family History: The patient's family history includes CAD in an other family member; Cancer in his father; Heart attack in his brother and mother; Heart disease in his brother and mother; Heart failure in his mother; Stroke in his mother.  ROS:   Please see  the history of present illness.    Review of Systems  Constitutional:  Negative for chills and fever.  HENT:  Negative for hearing loss.   Eyes:  Negative for blurred vision and redness.  Respiratory:  Negative for shortness of breath.   Cardiovascular:  Negative for chest pain, palpitations, orthopnea, claudication, leg swelling and PND.  Gastrointestinal:  Negative for melena, nausea and vomiting.  Genitourinary:  Negative for dysuria and flank pain.  Neurological:  Negative for dizziness and loss of consciousness.  Endo/Heme/Allergies:  Negative for polydipsia.  Psychiatric/Behavioral:  Positive for depression.    EKGs/Labs/Other Studies Reviewed:    The following studies were reviewed today: Cath 08/02/20: Prox LAD lesion is 95% stenosed. Prox LAD to Mid LAD lesion is 80% stenosed. 1st Mrg lesion is 75% stenosed. 2nd Mrg lesion is 60% stenosed.  Prox RCA to Mid RCA lesion is 70% stenosed. Previously placed Mid RCA-1 stent (unknown type) is widely patent. Mid RCA-2 lesion is 50% stenosed. The left ventricular systolic function is normal. LV end diastolic pressure is mildly elevated. The left ventricular ejection fraction is 55-65% by visual estimate.   1. 3 vessel obstructive CAD    - critical 95% proximal LAD    - 70% OM1    - 60% OM2    - 70% mid RCA proximal to prior stent with abnormal RFR 2. Normal LV function 3. Mildly elevated LVEDP   Plan: recommend CABG. Will hold Plavix. Admit to telemetry and treat with IV heparin. Check Echo.   TTE 08/02/20: IMPRESSIONS     1. Left ventricular ejection fraction, by estimation, is 60 to 65%. The  left ventricle has normal function. The left ventricle has no regional  wall motion abnormalities. Left ventricular diastolic parameters were  normal.   2. Right ventricular systolic function is normal. The right ventricular  size is normal. Tricuspid regurgitation signal is inadequate for assessing  PA pressure.   3. The mitral  valve is normal in structure. No evidence of mitral valve  regurgitation. No evidence of mitral stenosis.   4. The aortic valve is normal in structure. Aortic valve regurgitation is  not visualized. No aortic stenosis is present.   5. The inferior vena cava is normal in size with greater than 50%  respiratory variability, suggesting right atrial pressure of 3 mmHg.   06/18/13  ECHO Study Conclusions  Left ventricle: The cavity size was normal. There was mild focal basal hypertrophy of the septum. Systolic function was normal. The estimated ejection fraction was in the range of 55% to 60%. Wall motion was normal; there were no regional wall motion abnormalities. Features are consistent with a pseudonormal left ventricular filling pattern, with concomitant abnormal relaxation and increased filling pressure (grade 2 diastolic dysfunction).  Impressions:  - No prior study for comparison.  ANGIOGRAPHIC DATA: The left main coronary artery is absent. There appear to be separate ostia of the LAD and circumflex.  The left anterior descending artery is a large vessel which reaches the apex. In the mid vessel, there is a 50-70% lesion in the mid LAD. There is mild to moderate diffuse disease. There 2 large diagonals which are widely patent. The apical LAD is small but patent.  The left circumflex artery is a medium size vessel. There appears to be some vasospasm proximally. There is a large first marginal which is patent. The second marginal is a larger vessel which branches across the lateral wall. This appears widely patent.  The right coronary artery is a large dominant vessel. In the mid vessel, there is a focal 99% stenosis with visible thrombus. The posterior lateral artery is very large and widely patent. The posterior descending artery is widely patent.  LEFT VENTRICULOGRAM: Left ventricular angiogram was not done. LVEDP was 13 mmHg.  PCI NARRATIVE: A JR 4 guiding catheters u used to engage  the RCA. Angiomax was used for anticoagulation. Initially, a pro-water wire was advanced but would not cross the lesion. A Fielder XT density cross the lesion in the mid right coronary artery. A 2.5 x 12 balloon was used to predilate the lesion. A 2.75 x 16 promise drug-eluting stent was then deployed. The stent was post dilated with a 3.25 x 12 noncompliant balloon. There is an excellent angiographic result. There is no residual stenosis. There were several areas of vasospasm during  intervention noted in the distal right and posterior lateral artery. Both resolved with intracoronary nitroglycerin.  EKG:  EKG not performed today  Recent Labs: 08/10/2020: Magnesium 2.4 09/14/2020: ALT 17; BUN 22; Creatinine, Ser 1.22; Hemoglobin 13.3; Platelets 284; Potassium 4.1; Sodium 137  Recent Lipid Panel    Component Value Date/Time   CHOL 102 03/09/2020 1459   TRIG 90 03/09/2020 1459   HDL 39 (L) 03/09/2020 1459   CHOLHDL 2.6 03/09/2020 1459   CHOLHDL 3.1 02/13/2016 1540   VLDL 21 02/13/2016 1540   LDLCALC 45 03/09/2020 1459     Risk Assessment/Calculations:       Physical Exam:    VS:  BP 126/80   Pulse 87   Ht 5\' 8"  (1.727 m)   Wt 209 lb 3.2 oz (94.9 kg)   SpO2 97%   BMI 31.81 kg/m     Wt Readings from Last 3 Encounters:  12/07/20 209 lb 3.2 oz (94.9 kg)  09/29/20 196 lb 9.6 oz (89.2 kg)  09/23/20 196 lb (88.9 kg)     GEN: Comfortable, NAD HEENT: Normal NECK: No JVD; No carotid bruits CARDIAC: RRR, no murmurs RESPIRATORY:  CTAB, no wheezes ABDOMEN: Soft, non-tender, non-distended MUSCULOSKELETAL:  No significant edema. Chronic venous stasis changes SKIN: Warm and dry NEUROLOGIC:  Alert and oriented x 3. Tangential in thought process but answering questions appropriately PSYCHIATRIC:  Tangential in thought process  ASSESSMENT:    1. Coronary artery disease involving native coronary artery of native heart without angina pectoris   2. Chronic diastolic (congestive) heart  failure ([place])   3. Mixed hyperlipidemia   4. Essential hypertension, benign   5. CKD (chronic kidney disease) stage 2, GFR 60-89 ml/min   6. Essential hypertension   7. Hyperlipidemia, unspecified hyperlipidemia type   8. Chronic venous insufficiency    PLAN:    In order of problems listed above:  #Severe multivessel CAD s/p CABG Patient with history of NSTEMI in in 2015 s/p DES to mid-RCA with known residual moderate LAD disease. Presented to clinic with exertional chest pain in 07/2020 found to have multivessel CAD s/p 4v CABG on 07/2020 with L-LAD, S-PDA, S-OM1, S-OM2. Doing well.  -Continue plavix 75mg  daily -Continue aspirin 81mg  -Continue carvedilol 3.125mg  BID -Continue atorvastatin 80mg  daily  #Essential hypertension: Well controlled and at goal <120/80s. -Continue carvedilol 3.125mg  BID  #Chronic diastolic CHF: Stable and euvolemic on examination. No significant LE edema. -Continue lasix 40mg  daily -Low Na diet  #CKD: Stable. -Follow-up with PCP as scheduled  #HLD: -Continue atorvastatin 80mg  as above -Check lipids today   Medication Adjustments/Labs and Tests Ordered: Current medicines are reviewed at length with the patient today.  Concerns regarding medicines are outlined above.  Orders Placed This Encounter  Procedures   Lipid Profile   Meds ordered this encounter  Medications   furosemide (LASIX) 40 MG tablet    Sig: Take 1 tablet (40 mg total) by mouth daily.    Dispense:  90 tablet    Refill:  2    **Patient requests 90 days supply**   clopidogrel (PLAVIX) 75 MG tablet    Sig: Take 1 tablet (75 mg total) by mouth daily.    Dispense:  90 tablet    Refill:  2   carvedilol (COREG) 3.125 MG tablet    Sig: Take 1 tablet (3.125 mg total) by mouth 2 (two) times daily.    Dispense:  180 tablet    Refill:  2   atorvastatin (LIPITOR) 80 MG  tablet    Sig: Take 1 tablet (80 mg total) by mouth every evening.    Dispense:  90 tablet    Refill:  2     Patient Instructions  Medication Instructions:   Your physician recommends that you continue on your current medications as directed. Please refer to the Current Medication list given to you today.  *If you need a refill on your cardiac medications before your next appointment, please call your pharmacy*   Lab Work:  TODAY--LIPIDS  If you have labs (blood work) drawn today and your tests are completely normal, you will receive your results only by: [place] (if you have MyChart) OR A paper copy in the mail If you have any lab test that is abnormal or we need to change your treatment, we will call you to review the results.  Follow-Up: At [facility], you and your health needs are our priority.  As part of our continuing mission to provide you with exceptional heart care, we have created designated Provider Care Teams.  These Care Teams include your primary Cardiologist (physician) and Advanced Practice Providers (APPs -  Physician Assistants and Nurse Practitioners) who all work together to provide you with the care you need, when you need it.  We recommend signing up for the patient portal called "MyChart".  Sign up information is provided on this After Visit Summary.  MyChart is used to connect with patients for Virtual Visits (Telemedicine).  Patients are able to view lab/test results, encounter notes, upcoming appointments, etc.  Non-urgent messages can be sent to your provider as well.   To learn more about what you can do with MyChart, go to [email or web address].    Your next appointment:   6 month(s)  The format for your next appointment:   In Person  Provider:   You may see Freada Bergeron, MD or one of the following Advanced Practice Providers on your designated Care Team:   Richardson Dopp, PA-C Robbie Lis, [place]     Signed, Freada Bergeron, MD  12/07/2020 12:29 PM    [place]

## 2020-12-07 ENCOUNTER — Encounter: Payer: Self-pay | Admitting: Cardiology

## 2020-12-07 ENCOUNTER — Other Ambulatory Visit: Payer: Self-pay

## 2020-12-07 ENCOUNTER — Ambulatory Visit (INDEPENDENT_AMBULATORY_CARE_PROVIDER_SITE_OTHER): Payer: Medicare Other | Admitting: Cardiology

## 2020-12-07 VITALS — BP 126/80 | HR 87 | Ht 68.0 in | Wt 209.2 lb

## 2020-12-07 DIAGNOSIS — I872 Venous insufficiency (chronic) (peripheral): Secondary | ICD-10-CM

## 2020-12-07 DIAGNOSIS — Z951 Presence of aortocoronary bypass graft: Secondary | ICD-10-CM | POA: Diagnosis not present

## 2020-12-07 DIAGNOSIS — E782 Mixed hyperlipidemia: Secondary | ICD-10-CM

## 2020-12-07 DIAGNOSIS — E785 Hyperlipidemia, unspecified: Secondary | ICD-10-CM | POA: Diagnosis not present

## 2020-12-07 DIAGNOSIS — I5033 Acute on chronic diastolic (congestive) heart failure: Secondary | ICD-10-CM | POA: Diagnosis not present

## 2020-12-07 DIAGNOSIS — N182 Chronic kidney disease, stage 2 (mild): Secondary | ICD-10-CM | POA: Diagnosis not present

## 2020-12-07 DIAGNOSIS — I10 Essential (primary) hypertension: Secondary | ICD-10-CM | POA: Diagnosis not present

## 2020-12-07 DIAGNOSIS — I2510 Atherosclerotic heart disease of native coronary artery without angina pectoris: Secondary | ICD-10-CM | POA: Diagnosis not present

## 2020-12-07 DIAGNOSIS — I5032 Chronic diastolic (congestive) heart failure: Secondary | ICD-10-CM | POA: Diagnosis not present

## 2020-12-07 LAB — LIPID PANEL
Chol/HDL Ratio: 2.5 ratio (ref 0.0–5.0)
Cholesterol, Total: 109 mg/dL (ref 100–199)
HDL: 44 mg/dL (ref >39)
LDL Chol Calc (NIH): 47 mg/dL (ref 0–99)
Triglycerides: 96 mg/dL (ref 0–149)
VLDL Cholesterol Cal: 18 mg/dL (ref 5–40)

## 2020-12-07 MED ORDER — CLOPIDOGREL BISULFATE 75 MG PO TABS: 75.0000 mg | ORAL_TABLET | Freq: Every day | ORAL | 2 refills | Status: DC

## 2020-12-07 MED ORDER — FUROSEMIDE 40 MG PO TABS: 40.0000 mg | ORAL_TABLET | Freq: Every day | ORAL | 2 refills | Status: DC

## 2020-12-07 MED ORDER — ATORVASTATIN CALCIUM 80 MG PO TABS: 80.0000 mg | ORAL_TABLET | Freq: Every evening | ORAL | 2 refills | Status: DC

## 2020-12-07 MED ORDER — CARVEDILOL 3.125 MG PO TABS: 3.1250 mg | ORAL_TABLET | Freq: Two times a day (BID) | ORAL | 2 refills | Status: DC

## 2020-12-07 NOTE — Progress Notes (Signed)
Daily Session Note  Patient Details  Name: DEMITRIOS MOLYNEUX MRN: [id number] Date of Birth: 01-Jan-1961 Referring Provider:   Flowsheet Row Cardiac Rehab from 09/29/2020 in [facility]  Referring Provider [name]       Encounter Date: 12/07/2020  Check In:  Session Check In - 12/07/20 1330       Check-In   Supervising physician immediately available to respond to emergencies See telemetry face sheet for immediately available ER MD    Location [facility]-Cardiac & Pulmonary Rehab    Staff Present Birdie Sons, MPA, Nino Glow, MS, ASCM CEP, Exercise Physiologist;Joseph [place], [place];Heath Lark, RN, BSN, CCRP    Virtual Visit No    Medication changes reported     No    Fall or balance concerns reported    No    Warm-up and Cool-down Performed on first and last piece of equipment    Resistance Training Performed Yes    VAD Patient? No    PAD/SET Patient? No      Pain Assessment   Currently in Pain? No/denies                Social History   Tobacco Use  Smoking Status Former  Smokeless Tobacco Never  Tobacco Comments   Quit ~2007    Goals Met:  Independence with exercise equipment Exercise tolerated well No report of cardiac concerns or symptoms Strength training completed today  Goals Unmet:  Not Applicable  Comments: Pt able to follow exercise prescription today without complaint.  Will continue to monitor for progression.    Dr. Emily Filbert is Medical Director for [facility].  Dr. Ottie Glazier is Medical Director for [facility] Pulmonary Rehabilitation.

## 2020-12-07 NOTE — Patient Instructions (Signed)
Medication Instructions:   Your physician recommends that you continue on your current medications as directed. Please refer to the Current Medication list given to you today.  *If you need a refill on your cardiac medications before your next appointment, please call your pharmacy*   Lab Work:  TODAY--LIPIDS  If you have labs (blood work) drawn today and your tests are completely normal, you will receive your results only by: [place] (if you have MyChart) OR A paper copy in the mail If you have any lab test that is abnormal or we need to change your treatment, we will call you to review the results.  Follow-Up: At [facility], you and your health needs are our priority.  As part of our continuing mission to provide you with exceptional heart care, we have created designated Provider Care Teams.  These Care Teams include your primary Cardiologist (physician) and Advanced Practice Providers (APPs -  Physician Assistants and Nurse Practitioners) who all work together to provide you with the care you need, when you need it.  We recommend signing up for the patient portal called "MyChart".  Sign up information is provided on this After Visit Summary.  MyChart is used to connect with patients for Virtual Visits (Telemedicine).  Patients are able to view lab/test results, encounter notes, upcoming appointments, etc.  Non-urgent messages can be sent to your provider as well.   To learn more about what you can do with MyChart, go to [email or web address].    Your next appointment:   6 month(s)  The format for your next appointment:   In Person  Provider:   You may see Freada Bergeron, MD or one of the following Advanced Practice Providers on your designated Care Team:   Richardson Dopp, PA-C [place], [place]

## 2020-12-08 ENCOUNTER — Encounter: Payer: Medicare Other | Admitting: *Deleted

## 2020-12-08 DIAGNOSIS — Z951 Presence of aortocoronary bypass graft: Secondary | ICD-10-CM

## 2020-12-08 NOTE — Patient Instructions (Signed)
Discharge Patient Instructions  Patient Details  Name: Lonnie Skinner MRN: [id number] Date of Birth: 1960/12/09 Referring Provider:  Freada Bergeron, MD   Number of Visits: 17  Reason for Discharge:  Patient reached a stable level of exercise. Patient independent in their exercise. Patient has met program and personal goals.  Smoking History:  Social History   Tobacco Use  Smoking Status Former  Smokeless Tobacco Never  Tobacco Comments   Quit ~2007    Diagnosis:  S/P CABG x 4  Initial Exercise Prescription:  Initial Exercise Prescription - 09/29/20 1500       Date of Initial Exercise RX and Referring Provider   Date 09/29/20    Referring Provider [name]      Treadmill   MPH 2.4    Grade 1.5    Minutes 15    METs 3.3      NuStep   Level 3    SPM 80    Minutes 15    METs 3.45      Biostep-RELP   Level 3    SPM 50    Minutes 15    METs 3      Track   Laps 45    Minutes 15    METs 3.4      Prescription Details   Frequency (times per week) 3    Duration Progress to 30 minutes of continuous aerobic without signs/symptoms of physical distress      Intensity   THRR 40-80% of Max Heartrate 114-145    Ratings of Perceived Exertion 11-13    Perceived Dyspnea 0-4      Resistance Training   Training Prescription Yes    Weight 3 lb    Reps 10-15             Discharge Exercise Prescription (Final Exercise Prescription Changes):  Exercise Prescription Changes - 12/07/20 1600       Response to Exercise   Blood Pressure (Admit) 138/52    Blood Pressure (Exit) 118/72    Heart Rate (Admit) 84 bpm    Heart Rate (Exercise) 101 bpm    Heart Rate (Exit) 90 bpm    Rating of Perceived Exertion (Exercise) 12    Symptoms none    Duration Continue with 30 min of aerobic exercise without signs/symptoms of physical distress.    Intensity THRR unchanged      Progression   Progression Continue to progress workloads to maintain intensity without  signs/symptoms of physical distress.    Average METs 3.19      Resistance Training   Training Prescription Yes    Weight 5 lb    Reps 10-15      Interval Training   Interval Training No      Recumbant Elliptical   Level 4    Minutes 15    METs 3      Biostep-RELP   Level 5    Minutes 15    METs 3      Track   Laps 47    Minutes 15    METs 3.56      Home Exercise Plan   Plans to continue exercise at Home (comment)   walking   Frequency Add 2 additional days to program exercise sessions.    Initial Home Exercises Provided 11/17/20             Functional Capacity:  6 Minute Walk     Row Name 09/29/20 1535 11/30/20 1513  6 Minute Walk   Phase Initial Discharge    Distance 1300 feet 1800 feet    Distance % Change -- 38.4 %    Distance Feet Change -- 500 ft    Walk Time 6 minutes 6 minutes    # of Rest Breaks 0 0    MPH 2.46 3.4    METS 3.45 4.55    RPE 11 12    Perceived Dyspnea  1 0    VO2 Peak 12.08 15.94    Symptoms No No    Resting HR 84 bpm 94 bpm    Resting BP 128/84 122/68    Resting Oxygen Saturation  99 % 95 %    Exercise Oxygen Saturation  during 6 min walk 99 % 97 %    Max Ex. HR 102 bpm 121 bpm    Max Ex. BP 146/78 164/78    2 Minute Post BP 114/68 --             Nutrition:  Nutrition Therapy & Goals - 10/10/20 1432       Nutrition Therapy   Diet heart healthy, low Na    Drug/Food Interactions Statins/Certain Fruits    Protein (specify units) 70g    Fiber 30 grams    Whole Grain Foods 3 servings    Saturated Fats 12 max. grams    Fruits and Vegetables 8 servings/day    Sodium 1.5 grams      Personal Nutrition Goals   Nutrition Goal ST:LT: answer any questions patient has    Comments he doesn't eat too much in the morning - Carnation breakfast shakes.Late afternoon: chicken, mac and cheese, and lima beans. sloppy joes (hasnt had in a while), he likes vegetables and greens, beans, salads, subway sandwiches, K and W,  pizza. Snack: veggie straws (ranch) or ice cream or sherbert. Unable to get a good view of his diet day to day aside from breakfast which is consistent. Reviewed heart healthy eating basics including MyPlate, salt, and fats.  Answered questions about specific foods that patient had. Will continue to follow up with patient. Provided handout.      Intervention Plan   Intervention Prescribe, educate and counsel regarding individualized specific dietary modifications aiming towards targeted core components such as weight, hypertension, lipid management, diabetes, heart failure and other comorbidities.;Nutrition handout(s) given to patient.    Expected Outcomes Short Term Goal: Understand basic principles of dietary content, such as calories, fat, sodium, cholesterol and nutrients.;Short Term Goal: A plan has been developed with personal nutrition goals set during dietitian appointment.;Long Term Goal: Adherence to prescribed nutrition plan.             Nutrition Discharge:   Education Questionnaire Score:  Knowledge Questionnaire Score - 12/01/20 1547       Knowledge Questionnaire Score   Post Score 20/26             Goals reviewed with patient; copy given to patient.

## 2020-12-08 NOTE — Progress Notes (Signed)
Daily Session Note  Patient Details  Name: Lonnie Skinner MRN: [id number] Date of Birth: [birth year] Referring Provider:   Flowsheet Row Cardiac Rehab from 09/29/2020 in [facility] Cardiac and Pulmonary Rehab  Referring Provider [name]       Encounter Date: 12/08/2020  Check In:  Session Check In - 12/08/20 1326       Check-In   Supervising physician immediately available to respond to emergencies See telemetry face sheet for immediately available ER MD    Location [facility]-Cardiac & Pulmonary Rehab    Staff Present Renita Papa, RN BSN;Joseph Newman, RCP,RRT,BSRT;Jessica [place], [place], RCEP, CCRP, CCET    Virtual Visit No    Medication changes reported     No    Fall or balance concerns reported    No    Warm-up and Cool-down Performed on first and last piece of equipment    Resistance Training Performed Yes    VAD Patient? No    PAD/SET Patient? No      Pain Assessment   Currently in Pain? No/denies                Social History   Tobacco Use  Smoking Status Former  Smokeless Tobacco Never  Tobacco Comments   Quit ~2007    Goals Met:  Independence with exercise equipment Exercise tolerated well No report of cardiac concerns or symptoms Strength training completed today  Goals Unmet:  Not Applicable  Comments: Pt able to follow exercise prescription today without complaint.  Will continue to monitor for progression.    Dr. Emily Filbert is Medical Director for [facility].  Dr. Ottie Glazier is Medical Director for [facility] Pulmonary Rehabilitation.

## 2020-12-12 ENCOUNTER — Other Ambulatory Visit: Payer: Self-pay

## 2020-12-12 DIAGNOSIS — Z951 Presence of aortocoronary bypass graft: Secondary | ICD-10-CM | POA: Diagnosis not present

## 2020-12-12 NOTE — Progress Notes (Signed)
Daily Session Note  Patient Details  Name: Lonnie Skinner MRN: [id number] Date of Birth: [birth year] Referring Provider:   Flowsheet Row Cardiac Rehab from 09/29/2020 in [facility] Cardiac and Pulmonary Rehab  Referring Provider [name]       Encounter Date: 12/12/2020  Check In:  Session Check In - 12/12/20 1340       Check-In   Supervising physician immediately available to respond to emergencies See telemetry face sheet for immediately available ER MD    Location [facility]-Cardiac & Pulmonary Rehab    Staff Present Birdie Sons, MPA, Elveria Rising, BA, ACSM CEP, Exercise Physiologist;Joseph Salado, RCP,RRT,BSRT;Renley Banwart [employer], BS, ACSM CEP, Exercise Physiologist    Virtual Visit No    Medication changes reported     No    Fall or balance concerns reported    No    Warm-up and Cool-down Performed on first and last piece of equipment    Resistance Training Performed Yes    VAD Patient? No    PAD/SET Patient? No      Pain Assessment   Currently in Pain? No/denies                Social History   Tobacco Use  Smoking Status Former  Smokeless Tobacco Never  Tobacco Comments   Quit ~2007    Goals Met:  Independence with exercise equipment Exercise tolerated well No report of cardiac concerns or symptoms Strength training completed today  Goals Unmet:  Not Applicable  Comments:  Lonnie Skinner graduated today from  rehab with 36 sessions completed.  Details of the patient's exercise prescription and what He needs to do in order to continue the prescription and progress were discussed with patient.  Patient was given a copy of prescription and goals.  Patient verbalized understanding.  Lonnie Skinner plans to continue to exercise by walking at home and possibly joining the [place].    Dr. Emily Filbert is Medical Director for [facility].  Dr. Ottie Glazier is Medical Director for [facility] Pulmonary Rehabilitation.

## 2020-12-12 NOTE — Progress Notes (Signed)
Cardiac Individual Treatment Plan  Patient Details  Name: Lonnie Skinner MRN: [id number] Date of Birth: July 09, 1960 Referring Provider:   Flowsheet Row Cardiac Rehab from 09/29/2020 in [facility] Cardiac and Pulmonary Rehab  Referring Provider [name]       Initial Encounter Date:  Flowsheet Row Cardiac Rehab from 09/29/2020 in [facility] Cardiac and Pulmonary Rehab  Date 09/29/20       Visit Diagnosis: S/P CABG x 4  Patient's Home Medications on Admission:  Current Outpatient Medications:    acetaminophen (TYLENOL) 325 MG tablet, Take 2 tablets (650 mg total) by mouth every 6 (six) hours as needed for mild pain, headache or fever., Disp: , Rfl:    ALPRAZolam (XANAX) 0.25 MG tablet, TAKE 1 TABLET(0.25 MG) BY MOUTH TWICE DAILY AS NEEDED FOR ANXIETY, Disp: 60 tablet, Rfl: 0   aspirin 81 MG EC tablet, Take 1 tablet (81 mg total) by mouth daily., Disp: 30 tablet, Rfl:    atorvastatin (LIPITOR) 80 MG tablet, Take 1 tablet (80 mg total) by mouth every evening., Disp: 90 tablet, Rfl: 2   carvedilol (COREG) 3.125 MG tablet, Take 1 tablet (3.125 mg total) by mouth 2 (two) times daily., Disp: 180 tablet, Rfl: 2   clopidogrel (PLAVIX) 75 MG tablet, Take 1 tablet (75 mg total) by mouth daily., Disp: 90 tablet, Rfl: 2   furosemide (LASIX) 40 MG tablet, Take 1 tablet (40 mg total) by mouth daily., Disp: 90 tablet, Rfl: 2   nitroGLYCERIN (NITROSTAT) 0.4 MG SL tablet, Place 1 tablet (0.4 mg total) under the tongue every 5 (five) minutes as needed for chest pain (up to 3 doses)., Disp: 25 tablet, Rfl: 3   traMADol (ULTRAM) 50 MG tablet, Take 1 tablet (50 mg total) by mouth every 4 (four) hours as needed for moderate pain., Disp: 30 tablet, Rfl: 0  Past Medical History: Past Medical History:  Diagnosis Date   CAD (coronary artery disease)    a. 05/2013: NSTEMI s/p DES to mRCA (culprit vessel), residual moderate LAD disease (the patient fails medical therapy and this was found to be significant, this would be  amenable to PCI).   CHF (congestive heart failure) (HCC)    CKD (chronic kidney disease), stage II    DVT (deep venous thrombosis) ([place]) 1981   Hypercholesteremia    Hyperglycemia    Hypertension     Tobacco Use: Social History   Tobacco Use  Smoking Status Former  Smokeless Tobacco Never  Tobacco Comments   Quit ~2007    Labs: Recent Review Flowsheet Data     Labs for ITP Cardiac and Pulmonary Rehab Latest Ref Rng & Units 08/09/2020 08/09/2020 08/09/2020 08/09/2020 12/07/2020   Cholestrol 100 - 199 mg/dL - - - - 109   LDLCALC 0 - 99 mg/dL - - - - 47   HDL >39 mg/dL - - - - 44   Trlycerides 0 - 149 mg/dL - - - - 96   Hemoglobin A1c 4.8 - 5.6 % - - - - -   PHART 7.350 - 7.450 7.407 7.358 7.406 7.332(L) -   PCO2ART 32.0 - 48.0 mmHg 40.1 27.2(L) 37.2 46.8 -   HCO3 20.0 - 28.0 mmol/L 25.4 15.4(L) 23.5 24.8 -   TCO2 22 - 32 mmol/L 27 16(L) 25 26 -   ACIDBASEDEF 0.0 - 2.0 mmol/L - 9.0(H) 1.0 1.0 -   O2SAT % 100.0 99.0 99.0 99.0 -        Exercise Target Goals: Exercise Program Goal: Individual exercise prescription  set using results from initial 6 min walk test and THRR while considering  patient's activity barriers and safety.   Exercise Prescription Goal: Initial exercise prescription builds to 30-45 minutes a day of aerobic activity, 2-3 days per week.  Home exercise guidelines will be given to patient during program as part of exercise prescription that the participant will acknowledge.   Education: Aerobic Exercise: - Group verbal and visual presentation on the components of exercise prescription. Introduces F.I.T.T principle from ACSM for exercise prescriptions.  Reviews F.I.T.T. principles of aerobic exercise including progression. Written material given at graduation. Flowsheet Row Cardiac Rehab from 12/07/2020 in [facility] Cardiac and Pulmonary Rehab  Date 11/09/20  Educator AS  Instruction Review Code 1- Verbalizes Understanding       Education: Resistance  Exercise: - Group verbal and visual presentation on the components of exercise prescription. Introduces F.I.T.T principle from ACSM for exercise prescriptions  Reviews F.I.T.T. principles of resistance exercise including progression. Written material given at graduation. Flowsheet Row Cardiac Rehab from 12/07/2020 in [facility] Cardiac and Pulmonary Rehab  Date 11/16/20  Educator [place]  Instruction Review Code 1- Verbalizes Understanding        Education: Exercise & Equipment Safety: - Individual verbal instruction and demonstration of equipment use and safety with use of the equipment. Flowsheet Row Cardiac Rehab from 12/07/2020 in [facility] Cardiac and Pulmonary Rehab  Date 09/29/20  Educator AS  Instruction Review Code 1- Verbalizes Understanding       Education: Exercise Physiology & General Exercise Guidelines: - Group verbal and written instruction with models to review the exercise physiology of the cardiovascular system and associated critical values. Provides general exercise guidelines with specific guidelines to those with heart or lung disease.    Education: Flexibility, Balance, Mind/Body Relaxation: - Group verbal and visual presentation with interactive activity on the components of exercise prescription. Introduces F.I.T.T principle from ACSM for exercise prescriptions. Reviews F.I.T.T. principles of flexibility and balance exercise training including progression. Also discusses the mind body connection.  Reviews various relaxation techniques to help reduce and manage stress (i.e. Deep breathing, progressive muscle relaxation, and visualization). Balance handout provided to take home. Written material given at graduation. Flowsheet Row Cardiac Rehab from 12/07/2020 in [facility] Cardiac and Pulmonary Rehab  Date 11/23/20  Educator [facility]  Instruction Review Code 1- Verbalizes Understanding       Activity Barriers & Risk Stratification:  Activity Barriers & Cardiac Risk Stratification -  09/28/20 1535       Activity Barriers & Cardiac Risk Stratification   Activity Barriers Joint Problems   Both knees have had surgery.   Cardiac Risk Stratification High             6 Minute Walk:  6 Minute Walk     Row Name 09/29/20 1535 11/30/20 1513       6 Minute Walk   Phase Initial Discharge    Distance 1300 feet 1800 feet    Distance % Change -- 38.4 %    Distance Feet Change -- 500 ft    Walk Time 6 minutes 6 minutes    # of Rest Breaks 0 0    MPH 2.46 3.4    METS 3.45 4.55    RPE 11 12    Perceived Dyspnea  1 0    VO2 Peak 12.08 15.94    Symptoms No No    Resting HR 84 bpm 94 bpm    Resting BP 128/84 122/68    Resting Oxygen Saturation  99 % 95 %    Exercise Oxygen Saturation  during 6 min walk 99 % 97 %    Max Ex. HR 102 bpm 121 bpm    Max Ex. BP 146/78 164/78    2 Minute Post BP 114/68 --             Oxygen Initial Assessment:   Oxygen Re-Evaluation:   Oxygen Discharge (Final Oxygen Re-Evaluation):   Initial Exercise Prescription:  Initial Exercise Prescription - 09/29/20 1500       Date of Initial Exercise RX and Referring Provider   Date 09/29/20    Referring Provider [name]      Treadmill   MPH 2.4    Grade 1.5    Minutes 15    METs 3.3      NuStep   Level 3    SPM 80    Minutes 15    METs 3.45      Biostep-RELP   Level 3    SPM 50    Minutes 15    METs 3      Track   Laps 45    Minutes 15    METs 3.4      Prescription Details   Frequency (times per week) 3    Duration Progress to 30 minutes of continuous aerobic without signs/symptoms of physical distress      Intensity   THRR 40-80% of Max Heartrate 114-145    Ratings of Perceived Exertion 11-13    Perceived Dyspnea 0-4      Resistance Training   Training Prescription Yes    Weight 3 lb    Reps 10-15             Perform Capillary Blood Glucose checks as needed.  Exercise Prescription Changes:   Exercise Prescription Changes     Row Name  09/29/20 1500 10/13/20 0900 10/26/20 1600 11/09/20 1000 11/24/20 1800     Response to Exercise   Blood Pressure (Admit) 128/84 122/80 132/82 134/72 122/76   Blood Pressure (Exercise) 146/78 140/86 124/62 -- --   Blood Pressure (Exit) 114/68 130/78 122/84 122/80 108/64   Heart Rate (Admit) 84 bpm 92 bpm 97 bpm 96 bpm 78 bpm   Heart Rate (Exercise) 102 bpm 110 bpm 106 bpm 110 bpm 93 bpm   Heart Rate (Exit) 84 bpm 93 bpm 88 bpm 92 bpm 82 bpm   Oxygen Saturation (Admit) 99 % -- -- -- --   Oxygen Saturation (Exercise) 99 % -- -- -- --   Rating of Perceived Exertion (Exercise) _0 Perceived Dyspnea (Exercise) 1 -- -- -- --   Symptoms _1    Duration -- Progress to 30 minutes of  aerobic without signs/symptoms of physical distress Progress to 30 minutes of  aerobic without signs/symptoms of physical distress Continue with 30 min of aerobic exercise without signs/symptoms of physical distress. Continue with 30 min of aerobic exercise without signs/symptoms of physical distress.   Intensity -- THRR unchanged THRR unchanged THRR unchanged THRR unchanged     Progression   Progression -- Continue to progress workloads to maintain intensity without signs/symptoms of physical distress. Continue to progress workloads to maintain intensity without signs/symptoms of physical distress. Continue to progress workloads to maintain intensity without signs/symptoms of physical distress. Continue to progress workloads to maintain intensity without signs/symptoms of physical distress.   Average METs -- 2.67 2.9 2.57 3.06     Resistance Training  Training Prescription -- Yes Yes Yes Yes   Weight -- 4 lb 4 lb 4 lb 5 lb   Reps -- 10-15 10-15 10-15 10-15     Interval Training   Interval Training -- No No No No     NuStep   Level -- 3 -- -- 4   Minutes -- 15 -- -- 15   METs -- 2.6 -- -- 3.3     Arm Ergometer   Level -- -- 1 1 5.2   Minutes -- -- _0 METs -- -- 2.4 2.1  4.2     T5 Nustep   Level -- -- -- 4 --   Minutes -- -- -- 15 --   METs -- -- -- 2.4 --     Biostep-RELP   Level -- 3 -- -- 3   Minutes -- 15 -- -- 15   METs -- 3 -- -- 2     Track   Laps -- 30 45 40 47   Minutes -- _1 --   METs -- 2.6 3.4 3.2 --    Row Name 12/07/20 1600             Response to Exercise   Blood Pressure (Admit) 138/52       Blood Pressure (Exit) 118/72       Heart Rate (Admit) 84 bpm       Heart Rate (Exercise) 101 bpm       Heart Rate (Exit) 90 bpm       Rating of Perceived Exertion (Exercise) 12       Symptoms none       Duration Continue with 30 min of aerobic exercise without signs/symptoms of physical distress.       Intensity THRR unchanged               Progression     Progression Continue to progress workloads to maintain intensity without signs/symptoms of physical distress.       Average METs 3.19               Resistance Training     Training Prescription Yes       Weight 5 lb       Reps 10-15               Interval Training     Interval Training No               Recumbant Elliptical     Level 4       Minutes 15       METs 3               Biostep-RELP     Level 5       Minutes 15       METs 3               Track     Laps 47       Minutes 15       METs 3.56               Home Exercise Plan     Plans to continue exercise at Home (comment)  walking       Frequency Add 2 additional days to program exercise sessions.       Initial Home Exercises Provided 11/17/20               Exercise Comments:   Exercise  Comments     Row Name 10/03/20 1342 11/30/20 1420 12/12/20 1347       Exercise Comments First full day of exercise!  Patient was oriented to gym and equipment including functions, settings, policies, and procedures.  Patient's individual exercise prescription and treatment plan were reviewed.  All starting workloads were established based on the results of the 6 minute walk test done at initial  orientation visit.  The plan for exercise progression was also introduced and progression will be customized based on patient's performance and goals. Refoel graduated today from  rehab with 20 sessions completed.  Details of the patient's exercise prescription and what He needs to do in order to continue the prescription and progress were discussed with patient.  Patient was given a copy of prescription and goals.  Patient verbalized understanding.  Lonnie Skinner plans to continue to exercise by joining the [employer]. Lonnie Skinner graduated today from  rehab with 36 sessions completed.  Details of the patient's exercise prescription and what He needs to do in order to continue the prescription and progress were discussed with patient.  Patient was given a copy of prescription and goals.  Patient verbalized understanding.  Lonnie Skinner plans to continue to exercise by walking at home and possibly joining the [place]. (11/30/20 note was done in error)              Exercise Goals and Review:   Exercise Goals     Row Name 09/29/20 1547             Exercise Goals   Increase Physical Activity Yes       Intervention Provide advice, education, support and counseling about physical activity/exercise needs.;Develop an individualized exercise prescription for aerobic and resistive training based on initial evaluation findings, risk stratification, comorbidities and participant's personal goals.       Expected Outcomes Short Term: Attend rehab on a regular basis to increase amount of physical activity.;Long Term: Exercising regularly at least 3-5 days a week.       Increase Strength and Stamina Yes       Intervention Provide advice, education, support and counseling about physical activity/exercise needs.;Develop an individualized exercise prescription for aerobic and resistive training based on initial evaluation findings, risk stratification, comorbidities and participant's personal goals.       Expected  Outcomes Short Term: Increase workloads from initial exercise prescription for resistance, speed, and METs.;Short Term: Perform resistance training exercises routinely during rehab and add in resistance training at home;Long Term: Improve cardiorespiratory fitness, muscular endurance and strength as measured by increased METs and functional capacity (6MWT)       Able to understand and use rate of perceived exertion (RPE) scale Yes       Intervention Provide education and explanation on how to use RPE scale       Expected Outcomes Short Term: Able to use RPE daily in rehab to express subjective intensity level;Long Term:  Able to use RPE to guide intensity level when exercising independently       Able to understand and use Dyspnea scale Yes       Intervention Provide education and explanation on how to use Dyspnea scale       Expected Outcomes Short Term: Able to use Dyspnea scale daily in rehab to express subjective sense of shortness of breath during exertion;Long Term: Able to use Dyspnea scale to guide intensity level when exercising independently       Knowledge and understanding of Target Heart  Rate Range (THRR) Yes       Intervention Provide education and explanation of THRR including how the numbers were predicted and where they are located for reference       Expected Outcomes Short Term: Able to state/look up THRR;Long Term: Able to use THRR to govern intensity when exercising independently       Able to check pulse independently Yes       Intervention Provide education and demonstration on how to check pulse in carotid and radial arteries.;Review the importance of being able to check your own pulse for safety during independent exercise       Expected Outcomes Short Term: Able to explain why pulse checking is important during independent exercise;Long Term: Able to check pulse independently and accurately       Understanding of Exercise Prescription Yes       Intervention Provide education,  explanation, and written materials on patient's individual exercise prescription       Expected Outcomes Short Term: Able to explain program exercise prescription;Long Term: Able to explain home exercise prescription to exercise independently                Exercise Goals Re-Evaluation :  Exercise Goals Re-Evaluation     Row Name 10/03/20 1343 10/13/20 0927 10/26/20 1340 11/09/20 1002 11/17/20 1602     Exercise Goal Re-Evaluation   Exercise Goals Review Able to understand and use rate of perceived exertion (RPE) scale;Knowledge and understanding of Target Heart Rate Range (THRR);Increase Physical Activity;Understanding of Exercise Prescription;Increase Strength and Stamina;Able to understand and use Dyspnea scale;Able to check pulse independently Increase Physical Activity;Increase Strength and Stamina;Understanding of Exercise Prescription Increase Physical Activity;Increase Strength and Stamina Increase Physical Activity;Increase Strength and Stamina;Understanding of Exercise Prescription Increase Physical Activity;Increase Strength and Stamina   Comments Reviewed RPE and dyspnea scales, THR and program prescription with pt today.  Pt voiced understanding and was given a copy of goals to take home. Andros is off to a good start in rehab.  He does have some difficulty remember what to do each day, but once we get him going he does well on the machines. He is up to 2.5 METs on the arm crank.  We will continue to monitor his progress. Informed Clair Gulling about the [facility] and what he wants to do when he completes the program. Explained to him that it would be benificial for his health to have a plan when he is dome with rehab. Lonnie Skinner is doing well in rehab.  He is up to 40 laps on the track and level 4 on T5 NuStep.  We will need to make sure that he is maintaining his SPM on T5.  We will continue to monitor his progress. Lonnie Skinner feels he has more energy since he has started to exercise.  He has moved up to 5  lb weights for strength.  He walks his driveway sometimes at home.   Expected Outcomes Short: Use RPE daily to regulate intensity. Long: Follow program prescription in THR. Short: Continue to improve workloads Long: Continue to improve stamina Short: continue exercising and add days at home to workout. Long: join a gym after [employer]. Short: Continue to increase workloads Long: Continue to improve stamina --    Row Name 11/17/20 1604 11/24/20 1841 12/07/20 1600 12/12/20 1339       Exercise Goal Re-Evaluation   Exercise Goals Review -- Increase Physical Activity;Increase Strength and Stamina Increase Physical Activity;Increase Strength and Stamina;Understanding of Exercise Prescription Increase Physical  Activity;Increase Strength and Stamina;Understanding of Exercise Prescription    Comments Reviewed home exercise with pt today.  Pt plans to walk/consider [facility] for exercise.  Reviewed THR, pulse, RPE, sign and symptoms, pulse oximetery and when to call 911 or MD.  Also discussed weather considerations and indoor options.  Pt voiced understanding. Lonnie Skinner continues to do well in rehab. He has increased his number of laps on the track to 47 which hits him over 1 mile! RPE stays in appropriate range. Will continue to monitor. Lonnie Skinner is doing well in rehab.  He continues to do well on the track. He tried the elliptical and is not the biggest fan but continues to try for a few minutes.  He improved his post walk by 500 ft!  We will continue to montior his progress. Lonnie Skinner is graduating from rehab today. He plans to go to the [employer] and    Expected Outcomes Short: monitor RPE when exrecising at home Long: continue to build stamina Short: Continue to increase handweights Long: Increase overall MET level Short: Continue to try elliptical Long; Continue to exercise on off days at home --             Discharge Exercise Prescription (Final Exercise Prescription Changes):  Exercise Prescription Changes -  12/07/20 1600       Response to Exercise   Blood Pressure (Admit) 138/52    Blood Pressure (Exit) 118/72    Heart Rate (Admit) 84 bpm    Heart Rate (Exercise) 101 bpm    Heart Rate (Exit) 90 bpm    Rating of Perceived Exertion (Exercise) 12    Symptoms none    Duration Continue with 30 min of aerobic exercise without signs/symptoms of physical distress.    Intensity THRR unchanged      Progression   Progression Continue to progress workloads to maintain intensity without signs/symptoms of physical distress.    Average METs 3.19      Resistance Training   Training Prescription Yes    Weight 5 lb    Reps 10-15      Interval Training   Interval Training No      Recumbant Elliptical   Level 4    Minutes 15    METs 3      Biostep-RELP   Level 5    Minutes 15    METs 3      Track   Laps 47    Minutes 15    METs 3.56      Home Exercise Plan   Plans to continue exercise at Home (comment)   walking   Frequency Add 2 additional days to program exercise sessions.    Initial Home Exercises Provided 11/17/20             Nutrition:  Target Goals: Understanding of nutrition guidelines, daily intake of sodium <1561m, cholesterol <2022m calories 30% from fat and 7% or less from saturated fats, daily to have 5 or more servings of fruits and vegetables.  Education: All About Nutrition: -Group instruction provided by verbal, written material, interactive activities, discussions, models, and posters to present general guidelines for heart healthy nutrition including fat, fiber, MyPlate, the role of sodium in heart healthy nutrition, utilization of the nutrition label, and utilization of this knowledge for meal planning. Follow up email sent as well. Written material given at graduation. [facility]  Date 11/30/20  [facility]ruction Review Code 1- [employer]  Understanding       Biometrics:  Pre  Biometrics - 09/29/20 1600       Pre Biometrics   Height _0  (1.727 m)    Weight 196 lb 9.6 oz (89.2 kg)    BMI (Calculated) 29.9    Single Leg Stand 12.57 seconds              Nutrition Therapy Plan and Nutrition Goals:  Nutrition Therapy & Goals - 10/10/20 1432       Nutrition Therapy   Diet heart healthy, low Na    Drug/Food Interactions Statins/Certain Fruits    Protein (specify units) 70g    Fiber 30 grams    Whole Grain Foods 3 servings    Saturated Fats 12 max. grams    Fruits and Vegetables 8 servings/day    Sodium 1.5 grams      Personal Nutrition Goals   Nutrition Goal ST:LT: answer any questions patient has    Comments he doesn't eat too much in the morning - [employer] breakfast shakes.Late afternoon: chicken, mac and cheese, and lima beans. sloppy joes (hasnt had in a while), he likes vegetables and greens, beans, salads, subway sandwiches, K and W, pizza. Snack: veggie straws (ranch) or ice cream or sherbert. Unable to get a good view of his diet day to day aside from breakfast which is consistent. Reviewed heart healthy eating basics including MyPlate, salt, and fats.  Answered questions about specific foods that patient had. Will continue to follow up with patient. Provided handout.      Intervention Plan   Intervention Prescribe, educate and counsel regarding individualized specific dietary modifications aiming towards targeted core components such as weight, hypertension, lipid management, diabetes, heart failure and other comorbidities.;Nutrition handout(s) given to patient.    Expected Outcomes Short Term Goal: Understand basic principles of dietary content, such as calories, fat, sodium, cholesterol and nutrients.;Short Term Goal: A plan has been developed with personal nutrition goals set during dietitian appointment.;Long Term Goal: Adherence to prescribed nutrition plan.             Nutrition Assessments:  MEDIFICTS Score Key: ?70 Need to make  dietary changes  40-70 Heart Healthy Diet ? 40 Therapeutic Level Cholesterol Diet  Flowsheet Row Cardiac Rehab from 12/01/2020 in [facility] Cardiac and Pulmonary Rehab  Picture Your Plate Total Score on Discharge 61      Picture Your Plate Scores: <31 Unhealthy dietary pattern with much room for improvement. 41-50 Dietary pattern unlikely to meet recommendations for good health and room for improvement. 51-60 More healthful dietary pattern, with some room for improvement.  >60 Healthy dietary pattern, although there may be some specific behaviors that could be improved.    Nutrition Goals Re-Evaluation:  Nutrition Goals Re-Evaluation     Lonnie Skinner Name 10/26/20 1348 11/17/20 1556           Goals   Current Weight 203 lb (92.1 kg) --      Nutrition Goal Watch what he is eating more closely. Red eats when he is hungry.  He says he tried not to eat fried foods.  Review information given by RD to improve diet      Comment Lonnie Skinner states that he does not eat breakfast all the time and has had a shake today only. Informed him that he may need more food in order for him to have energy and lose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

## 2020-12-12 NOTE — Progress Notes (Signed)
Discharge Progress Report  Patient Details  Name: Lonnie Skinner MRN: [id number] Date of Birth: 1961-04-10 Referring Provider:   Flowsheet Row Cardiac Rehab from 09/29/2020 in [facility] Cardiac and Pulmonary Rehab  Referring Provider [place]        Number of Visits: 19  Reason for Discharge:  Patient reached a stable level of exercise. Patient independent in their exercise. Patient has met program and personal goals.  Smoking History:  Social History   Tobacco Use  Smoking Status Former  Smokeless Tobacco Never  Tobacco Comments   Quit ~2007    Diagnosis:  S/P CABG x 4  ADL UCSD:   Initial Exercise Prescription:  Initial Exercise Prescription - 09/29/20 1500       Date of Initial Exercise RX and Referring Provider   Date 09/29/20    Referring Provider [name]      Treadmill   MPH 2.4    Grade 1.5    Minutes 15    METs 3.3      NuStep   Level 3    SPM 80    Minutes 15    METs 3.45      Biostep-RELP   Level 3    SPM 50    Minutes 15    METs 3      Track   Laps 45    Minutes 15    METs 3.4      Prescription Details   Frequency (times per week) 3    Duration Progress to 30 minutes of continuous aerobic without signs/symptoms of physical distress      Intensity   THRR 40-80% of Max Heartrate 114-145    Ratings of Perceived Exertion 11-13    Perceived Dyspnea 0-4      Resistance Training   Training Prescription Yes    Weight 3 lb    Reps 10-15             Discharge Exercise Prescription (Final Exercise Prescription Changes):  Exercise Prescription Changes - 12/07/20 1600       Response to Exercise   Blood Pressure (Admit) 138/52    Blood Pressure (Exit) 118/72    Heart Rate (Admit) 84 bpm    Heart Rate (Exercise) 101 bpm    Heart Rate (Exit) 90 bpm    Rating of Perceived Exertion (Exercise) 12    Symptoms none    Duration Continue with 30 min of aerobic exercise without signs/symptoms of physical distress.    Intensity THRR  unchanged      Progression   Progression Continue to progress workloads to maintain intensity without signs/symptoms of physical distress.    Average METs 3.19      Resistance Training   Training Prescription Yes    Weight 5 lb    Reps 10-15      Interval Training   Interval Training No      Recumbant Elliptical   Level 4    Minutes 15    METs 3      Biostep-RELP   Level 5    Minutes 15    METs 3      Track   Laps 47    Minutes 15    METs 3.56      Home Exercise Plan   Plans to continue exercise at Home (comment)   walking   Frequency Add 2 additional days to program exercise sessions.    Initial Home Exercises Provided 11/17/20  Functional Capacity:  6 Minute Walk     Row Name 09/29/20 1535 11/30/20 1513       6 Minute Walk   Phase Initial Discharge    Distance 1300 feet 1800 feet    Distance % Change -- 38.4 %    Distance Feet Change -- 500 ft    Walk Time 6 minutes 6 minutes    # of Rest Breaks 0 0    MPH 2.46 3.4    METS 3.45 4.55    RPE 11 12    Perceived Dyspnea  1 0    VO2 Peak 12.08 15.94    Symptoms No No    Resting HR 84 bpm 94 bpm    Resting BP 128/84 122/68    Resting Oxygen Saturation  99 % 95 %    Exercise Oxygen Saturation  during 6 min walk 99 % 97 %    Max Ex. HR 102 bpm 121 bpm    Max Ex. BP 146/78 164/78    2 Minute Post BP 114/68 --             Psychological, QOL, Others - Outcomes: PHQ 2/9: Depression screen [facility] 2/9 12/01/2020 09/29/2020 09/14/2020 03/09/2020 04/22/2019  Decreased Interest 0 1 0 0 0  Down, Depressed, Hopeless _0 0 0  PHQ - 2 Score _1 0 0  Altered sleeping 0 0 0 0 -  Tired, decreased energy 0 1 1 0 -  Change in appetite 0 0 0 0 -  Feeling bad or failure about yourself  0 1 1 0 -  Trouble concentrating 0 0 0 0 -  Moving slowly or fidgety/restless 0 1 0 0 -  Suicidal thoughts 0 0 0 0 -  PHQ-9 Score _2 0 -  Difficult doing work/chores Not difficult at all Very difficult - - -     Quality of Life:  Quality of Life - 12/01/20 1546       Quality of Life   Select Quality of Life      Quality of Life Scores   Health/Function Pre 16.73 %    Health/Function Post 21.92 %    Health/Function % Change 31.02 %    Socioeconomic Pre 12.75 %    Socioeconomic Post 20.21 %    Socioeconomic % Change  58.51 %    Psych/Spiritual Pre 14.4 %    Psych/Spiritual Post 18.86 %    Psych/Spiritual % Change 30.97 %    Family Pre 15.5 %    Family Post 22.5 %    Family % Change 45.16 %    GLOBAL Pre 15.63 %    GLOBAL Post 20.92 %    GLOBAL % Change 33.85 %             Nutrition & Weight - Outcomes:  Pre Biometrics - 09/29/20 1600       Pre Biometrics   Height _3  (1.727 m)    Weight 196 lb 9.6 oz (89.2 kg)    BMI (Calculated) 29.9    Single Leg Stand 12.57 seconds              Nutrition:  Nutrition Therapy & Goals - 10/10/20 1432       Nutrition Therapy   Diet heart healthy, low Na    Drug/Food Interactions Statins/Certain Fruits    Protein (specify units) 70g    Fiber 30 grams    Whole Grain Foods 3 servings  Saturated Fats 12 max. grams    Fruits and Vegetables 8 servings/day    Sodium 1.5 grams      Personal Nutrition Goals   Nutrition Goal ST:LT: answer any questions patient has    Comments he doesn't eat too much in the morning - [employer] breakfast shakes.Late afternoon: chicken, mac and cheese, and lima beans. sloppy joes (hasnt had in a while), he likes vegetables and greens, beans, salads, subway sandwiches, K and W, pizza. Snack: veggie straws (ranch) or ice cream or sherbert. Unable to get a good view of his diet day to day aside from breakfast which is consistent. Reviewed heart healthy eating basics including MyPlate, salt, and fats.  Answered questions about specific foods that patient had. Will continue to follow up with patient. Provided handout.      Intervention Plan   Intervention Prescribe, educate and counsel regarding  individualized specific dietary modifications aiming towards targeted core components such as weight, hypertension, lipid management, diabetes, heart failure and other comorbidities.;Nutrition handout(s) given to patient.    Expected Outcomes Short Term Goal: Understand basic principles of dietary content, such as calories, fat, sodium, cholesterol and nutrients.;Short Term Goal: A plan has been developed with personal nutrition goals set during dietitian appointment.;Long Term Goal: Adherence to prescribed nutrition plan.             Nutrition Discharge:   Education Questionnaire Score:  Knowledge Questionnaire Score - 12/01/20 1547       Knowledge Questionnaire Score   Post Score 20/26             Goals reviewed with patient; copy given to patient.

## 2020-12-13 ENCOUNTER — Telehealth: Payer: Self-pay | Admitting: *Deleted

## 2020-12-13 DIAGNOSIS — Z1211 Encounter for screening for malignant neoplasm of colon: Secondary | ICD-10-CM

## 2020-12-13 NOTE — Telephone Encounter (Signed)
Patient called requesting a new referral be placed for [facility], he is ready to get his colonoscopy now.  Will forward to MD and ask him to the PENDED order in the encounter.  Thanks [employer]

## 2020-12-15 NOTE — Telephone Encounter (Signed)
Referral placed. Thank you so much. Alen Bleacher, MD

## 2021-03-22 ENCOUNTER — Ambulatory Visit (INDEPENDENT_AMBULATORY_CARE_PROVIDER_SITE_OTHER): Payer: Medicare Other

## 2021-03-22 ENCOUNTER — Other Ambulatory Visit: Payer: Self-pay

## 2021-03-22 DIAGNOSIS — Z23 Encounter for immunization: Secondary | ICD-10-CM | POA: Diagnosis not present

## 2021-04-05 ENCOUNTER — Encounter: Payer: Self-pay | Admitting: Student

## 2021-04-05 ENCOUNTER — Ambulatory Visit (INDEPENDENT_AMBULATORY_CARE_PROVIDER_SITE_OTHER): Payer: Medicare Other | Admitting: Student

## 2021-04-05 ENCOUNTER — Other Ambulatory Visit: Payer: Self-pay

## 2021-04-05 VITALS — BP 132/80 | Ht 68.0 in | Wt 212.8 lb

## 2021-04-05 DIAGNOSIS — Z0000 Encounter for general adult medical examination without abnormal findings: Secondary | ICD-10-CM | POA: Diagnosis not present

## 2021-04-05 DIAGNOSIS — Z1211 Encounter for screening for malignant neoplasm of colon: Secondary | ICD-10-CM | POA: Diagnosis not present

## 2021-04-05 NOTE — Patient Instructions (Addendum)
It was wonderful to meet you today. Thank you for allowing me to be a part of your care. Below is a short summary of what we discussed at your visit today:  Today your physical exam was normal. For your annual wellness you are overdue for colonoscopy. I have put in a referral for your colonoscopy.  You received the PVC 20 Pneumococcal Vaccine and will get the COVID vaccine at next visit. You are overdue for shingles vaccine which I advise you get at any pharmacy.  We drew your lab to check your electrolyte, kidney and Liver function.   If you have any questions or concerns, please do not hesitate to contact us via phone or MyChart message.   Alen Bleacher, MD [facility]

## 2021-04-05 NOTE — Progress Notes (Signed)
    SUBJECTIVE:   CHIEF COMPLAINT / HPI: Annual Physical   Lonnie Skinner is a 60 year old male who presents today for his annual physical exam. He report he is doing well and have no complaint at this time. He see a cardiologist regularly since his stent placement and have been making good healthy decision.  He denies having any headaches, dizziness, chest pains or SOB. He expressed desire to get his pneumococcal vaccine today.   PERTINENT  PMH / PSH: CAD s/p DES, CHF, CAD, MI, HLD and HTN  OBJECTIVE:   BP 132/80   Ht 5\' 8"  (1.727 m)   Wt 96.5 kg   SpO2 98%   BMI 32.36 kg/m    Physical Exam General: Alert, well appearing, NAD, Oriented x3 Cardiovascular: RRR, No Murmurs, Normal S2/S2 Respiratory: CTAB, No wheezing or Rales Abdomen: No distension or tenderness Extremities: No edema on extremities   Skin: Warm and dry  ASSESSMENT/PLAN:   Well adult health check 60 year old healthy male who presents today for well check. He has no complaints. His vitals were within normal limit and physical exam was normal. He is overdue for coloscopy and some vaccinatiosn which include Pneumococcal, Shingles and COVID vaccine. Patient expressed interest in getting the pneumococcal vaccine today and wants to get the COVID shot separately. -Administered the Bernie in referral for colonoscopy -recommended patient get his shingles vaccine at any pharmacy -Discussed PSA screening with patient, which he declined at this time -Order CMP lab -Follow up in a year or as needed      Alen Bleacher, MD [place]

## 2021-04-05 NOTE — Assessment & Plan Note (Addendum)
60 year old healthy male who presents today for well check. He has no complaints. His vitals were within normal limit and physical exam was normal. He is overdue for coloscopy and some vaccinatiosn which include Pneumococcal, Shingles and COVID vaccine. Patient expressed interest in getting the pneumococcal vaccine today and wants to get the COVID shot separately. -Administered the Pnrevna 20 -Sent in referral for colonoscopy -recommended patient get his shingles vaccine at any pharmacy -Discussed PSA screening with patient, which he declined at this time -Order CMP lab -Follow up in a year or as needed

## 2021-04-06 LAB — LIPID PANEL
Chol/HDL Ratio: 2.4 ratio (ref 0.0–5.0)
Cholesterol, Total: 119 mg/dL (ref 100–199)
HDL: 49 mg/dL (ref >39)
LDL Chol Calc (NIH): 53 mg/dL (ref 0–99)
Triglycerides: 89 mg/dL (ref 0–149)
VLDL Cholesterol Cal: 17 mg/dL (ref 5–40)

## 2021-04-06 LAB — COMPREHENSIVE METABOLIC PANEL
ALT: 14 IU/L (ref 0–44)
AST: 17 IU/L (ref 0–40)
Albumin/Globulin Ratio: 1.6 (ref 1.2–2.2)
Albumin: 4.1 g/dL (ref 3.8–4.8)
Alkaline Phosphatase: 114 IU/L (ref 44–121)
BUN/Creatinine Ratio: 14 (ref 10–24)
BUN: 19 mg/dL (ref 8–27)
Bilirubin Total: 0.5 mg/dL (ref 0.0–1.2)
CO2: 28 mmol/L (ref 20–29)
Calcium: 9.7 mg/dL (ref 8.6–10.2)
Chloride: 103 mmol/L (ref 96–106)
Creatinine, Ser: 1.39 mg/dL — ABNORMAL HIGH (ref 0.76–1.27)
Globulin, Total: 2.5 g/dL (ref 1.5–4.5)
Glucose: 102 mg/dL — ABNORMAL HIGH (ref 70–99)
Potassium: 5.5 mmol/L — ABNORMAL HIGH (ref 3.5–5.2)
Sodium: 144 mmol/L (ref 134–144)
Total Protein: 6.6 g/dL (ref 6.0–8.5)
eGFR: 58 mL/min/{1.73_m2} — ABNORMAL LOW (ref >59)

## 2021-04-06 LAB — HEMOGLOBIN A1C
Est. average glucose Bld gHb Est-mCnc: 105 mg/dL
Hgb A1c MFr Bld: 5.3 % (ref 4.8–5.6)

## 2021-04-17 ENCOUNTER — Ambulatory Visit (INDEPENDENT_AMBULATORY_CARE_PROVIDER_SITE_OTHER): Payer: Medicare Other | Admitting: Family Medicine

## 2021-04-17 ENCOUNTER — Encounter: Payer: Self-pay | Admitting: Family Medicine

## 2021-04-17 ENCOUNTER — Other Ambulatory Visit: Payer: Self-pay

## 2021-04-17 VITALS — BP 138/102 | HR 68 | Wt 208.2 lb

## 2021-04-17 DIAGNOSIS — R059 Cough, unspecified: Secondary | ICD-10-CM | POA: Insufficient documentation

## 2021-04-17 DIAGNOSIS — R350 Frequency of micturition: Secondary | ICD-10-CM | POA: Diagnosis not present

## 2021-04-17 LAB — POCT UA - MICROSCOPIC ONLY

## 2021-04-17 LAB — POCT URINALYSIS DIP (MANUAL ENTRY)
Blood, UA: NEGATIVE
Glucose, UA: NEGATIVE mg/dL
Ketones, POC UA: NEGATIVE mg/dL
Leukocytes, UA: NEGATIVE
Nitrite, UA: NEGATIVE
Protein Ur, POC: 30 mg/dL — AB
Spec Grav, UA: >=1.03 — AB (ref 1.010–1.025)
Urobilinogen, UA: 0.2 E.U./dL
pH, UA: 5.5 (ref 5.0–8.0)

## 2021-04-17 MED ORDER — BENZONATATE 100 MG PO CAPS: 100.0000 mg | ORAL_CAPSULE | Freq: Two times a day (BID) | ORAL | 0 refills | Status: DC | PRN

## 2021-04-17 NOTE — Patient Instructions (Signed)
We will check for COVID and influenza as well as check your urine for any signs of infection. I will notify you of any abnormal results.   Please drink honey with warm tea or coffee to help with your symptoms.

## 2021-04-17 NOTE — Progress Notes (Signed)
    SUBJECTIVE:   CHIEF COMPLAINT / HPI: cough and congestion   Cough and congestion  Patient reports that his son was visiting his home. Patient's son felt bad one week ago and tested negative for COVID but was positive for influenza. Patient's symptoms include cough, scratchy throat & headache. He denies fevers. He denies body aches. He denies diarrhea. He denies nausea. He reports taking Tylenol cold and flu and has been taking that since he was around his son. He has not taken any today or last night.   PERTINENT  PMH / PSH:  CKD  HTN   OBJECTIVE:   BP (!) 138/102   Pulse 68   Wt 208 lb 3.2 oz (94.4 kg)   SpO2 99%   BMI 31.66 kg/m   General: male appearing stated age in no acute distress HEENT: MMM, no oral lesions noted,Neck non-tender without lymphadenopathy, masses or thyromegaly, TM normal bilaterally  Cardio: Normal S1 and S2, no S3 or S4. Rhythm is regularly irregular. No murmurs or rubs.  Bilateral radial pulses palpable Pulm: Clear to auscultation bilaterally, no crackles, wheezing, or diminished breath sounds. Normal respiratory effort, stable on RA Abdomen: Bowel sounds normal. Abdomen soft and non-tender.   ASSESSMENT/PLAN:   Cough in adult patient Known exposure to influenza  Will test for Covid and influenza and follow up once results available  RX for tessalon perles sent to pharmacy  AVS recommended for drinking  honey in warm fluids   Increased urinary frequency U/A collected today, will f/u with results as available      Eulis Foster, MD [place]

## 2021-04-17 NOTE — Assessment & Plan Note (Signed)
Known exposure to influenza  Will test for Covid and influenza and follow up once results available  RX for tessalon perles sent to pharmacy  AVS recommended for drinking  honey in warm fluids

## 2021-04-17 NOTE — Assessment & Plan Note (Signed)
U/A collected today, will f/u with results as available

## 2021-04-18 LAB — COVID-19, FLU A+B NAA
Influenza A, NAA: DETECTED — AB
Influenza B, NAA: NOT DETECTED
SARS-CoV-2, NAA: NOT DETECTED

## 2021-07-13 ENCOUNTER — Ambulatory Visit (INDEPENDENT_AMBULATORY_CARE_PROVIDER_SITE_OTHER): Payer: Medicare Other

## 2021-07-13 ENCOUNTER — Other Ambulatory Visit: Payer: Self-pay

## 2021-07-13 VITALS — BP 118/70 | HR 78 | Ht 68.0 in | Wt 210.0 lb

## 2021-07-13 DIAGNOSIS — Z0000 Encounter for general adult medical examination without abnormal findings: Secondary | ICD-10-CM

## 2021-07-13 NOTE — Progress Notes (Addendum)
Subjective:   Lonnie Skinner is a 61 y.o. male who presents for Medicare Annual/Subsequent preventive examination.  Review of Systems: Defer to PCP.  Cardiac Risk Factors include: advanced age (>52men, >68 women);family history of premature cardiovascular disease;hypertension;male gender;obesity (BMI >30kg/m2)  Objective:    Vitals: BP 118/70    Pulse 78    Ht 5\' 8"  (1.727 m)    Wt 210 lb (95.3 kg)    SpO2 98%    BMI 31.93 kg/m   Body mass index is 31.93 kg/m.  Advanced Directives 07/13/2021 04/17/2021 09/28/2020 09/14/2020 08/02/2020 03/09/2020 04/18/2017  Does Patient Have a Medical Advance Directive? No No No No No No No  Would patient like information on creating a medical advance directive? Yes (MAU/Ambulatory/Procedural Areas - Information given) No - Patient declined Yes (MAU/Ambulatory/Procedural Areas - Information given) No - Patient declined No - Patient declined No - Patient declined No - Patient declined  Pre-existing out of facility DNR order (yellow form or pink MOST form) - - - - - - -   Tobacco Social History   Tobacco Use  Smoking Status Former   Passive exposure: Past  Smokeless Tobacco Never  Tobacco Comments   Quit ~2007     Counseling given: No plans to restart.  Clinical Intake:  Pre-visit preparation completed: Yes  Pain Score: 0-No pain  How often do you need to have someone help you when you read instructions, pamphlets, or other written materials from your doctor or pharmacy?: 3 - Sometimes What is the last grade level you completed in school?: 11th then [employer]  Interpreter Needed?: No  Past Medical History:  Diagnosis Date   CAD (coronary artery disease)    a. 05/2013: NSTEMI s/p DES to mRCA (culprit vessel), residual moderate LAD disease (the patient fails medical therapy and this was found to be significant, this would be amenable to PCI).   CHF (congestive heart failure) (HCC)    CKD (chronic kidney disease), stage II    DVT (deep venous  thrombosis) ([place]) 1981   Hypercholesteremia    Hyperglycemia    Hypertension    Past Surgical History:  Procedure Laterality Date   CORONARY ARTERY BYPASS GRAFT N/A 08/09/2020   Procedure: CORONARY ARTERY BYPASS GRAFTING (CABG) X  FOUR   USING  LEFT INTERNAL MAMMARY ARTERY HARVEST AND RIGHT AND LEFT ENDOSCOPIC SAPHENOUS VEIN HARVEST;  Surgeon: [name], MD;  Location: [facility];  Service: Open Heart Surgery;  Laterality: N/A;   CORONARY STENT PLACEMENT  05/2013   mRCA   CORONARY/GRAFT ACUTE MI REVASCULARIZATION  2015   INTRAVASCULAR PRESSURE WIRE/FFR STUDY N/A 08/02/2020   Procedure: INTRAVASCULAR PRESSURE WIRE/FFR STUDY;  Surgeon: [name], [name], MD;  Location: [facility];  Service: Cardiovascular;  Laterality: N/A;   KNEE SURGERY Left 2009   LEFT HEART CATH AND CORONARY ANGIOGRAPHY N/A 08/02/2020   Procedure: LEFT HEART CATH AND CORONARY ANGIOGRAPHY;  Surgeon: [name], [name], MD;  Location: [facility];  Service: Cardiovascular;  Laterality: N/A;   LEFT HEART CATHETERIZATION WITH CORONARY ANGIOGRAM N/A 06/02/2013   Procedure: LEFT HEART CATHETERIZATION WITH CORONARY ANGIOGRAM;  Surgeon: [name], MD;  [facility];  Service: Cardiovascular;  Laterality: N/A;   PERCUTANEOUS CORONARY STENT INTERVENTION (PCI-S)  06/02/2013   Procedure: PERCUTANEOUS CORONARY STENT INTERVENTION (PCI-S);  Surgeon: [name], MD;  Location: [facility] CATH LAB;  Service: Cardiovascular;;   TEE WITHOUT CARDIOVERSION N/A 08/09/2020   Procedure: TRANSESOPHAGEAL ECHOCARDIOGRAM (TEE);  Surgeon: [name], MD;  Location: [facility];  Service: Open Heart Surgery;  Laterality: N/A;   Family History  Problem Relation Age of Onset   Diabetes Mother    Heart failure Father    Heart attack Father    Cancer Father    Heart disease Brother        cabg   Heart attack Brother    CAD Other        Multiple family members with CABG   Social History   Socioeconomic History    Marital status: Widowed    Spouse name: Not on file   Number of children: 2   Years of education: 33   Highest education level: 11th grade  Occupational History    Comment: Disabled  Tobacco Use   Smoking status: Former    Passive exposure: Past   Smokeless tobacco: Never   Tobacco comments:    Quit ~2007  Vaping Use   Vaping Use: Never used  Substance and Sexual Activity   Alcohol use: No   Drug use: No   Sexual activity: Not Currently    Birth control/protection: None  Other Topics Concern   Not on file  Social History Narrative   Patient lives alone in. Patients wife died ~1 year ago.    Patient has support from his son and some friends.    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    Patient recently graduated cardiac rehab and has been going to [employer] 3x per week for ~ 2 hours.    Social Determinants of Health   Financial Resource Strain: Low Risk    Difficulty of Paying Living Expenses: Not hard at all  Food Insecurity: No Food Insecurity   Worried About Charity fundraiser in the Last Year: Never true   [place] in the Last Year: Never true  Transportation Needs: No Transportation Needs   Lack of Transportation (Medical): No   Lack of Transportation (Non-Medical): No  Physical Activity: Sufficiently Active   Days of Exercise per Week: 3 days   Minutes of Exercise per Session: 120 min  Stress: No Stress Concern Present   Feeling of Stress : Not at all  Social Connections: Socially Isolated   Frequency of Communication with Friends and Family: More than three times a week   Frequency of Social Gatherings with Friends and Family: More than three times a week   Attends Religious Services: Never   Marine scientist or Organizations: No   Attends Archivist Meetings: Never   Marital Status: Widowed   Outpatient Encounter Medications as of 07/13/2021  Medication Sig   acetaminophen (TYLENOL) 325 MG tablet Take 2 tablets  (650 mg total) by mouth every 6 (six) hours as needed for mild pain, headache or fever.   ALPRAZolam (XANAX) 0.25 MG tablet TAKE 1 TABLET(0.25 MG) BY MOUTH TWICE DAILY AS NEEDED FOR ANXIETY   aspirin 81 MG EC tablet Take 1 tablet (81 mg total) by mouth daily.   atorvastatin (LIPITOR) 80 MG tablet Take 1 tablet (80 mg total) by mouth every evening.   carvedilol (COREG) 3.125 MG tablet Take 1 tablet (3.125 mg total) by mouth 2 (two) times daily.   clopidogrel (PLAVIX) 75 MG tablet Take 1 tablet (75 mg total) by mouth daily.   furosemide (LASIX) 40 MG tablet Take 1 tablet (40 mg total) by mouth daily.   nitroGLYCERIN (NITROSTAT) 0.4 MG SL tablet Place  1 tablet (0.4 mg total) under the tongue every 5 (five) minutes as needed for chest pain (up to 3 doses).   benzonatate (TESSALON) 100 MG capsule Take 1 capsule (100 mg total) by mouth 2 (two) times daily as needed for cough. (Patient not taking: Reported on 07/13/2021)   traMADol (ULTRAM) 50 MG tablet Take 1 tablet (50 mg total) by mouth every 4 (four) hours as needed for moderate pain. (Patient not taking: Reported on 07/13/2021)   No facility-administered encounter medications on file as of 07/13/2021.   Activities of Daily Living In your present state of health, do you have any difficulty performing the following activities: 07/13/2021 08/02/2020  Hearing? [place]? N -  Difficulty concentrating or making decisions? Y -  Walking or climbing stairs? N -  Dressing or bathing? N -  Doing errands, shopping? N N  Preparing Food and eating ? N -  Using the Toilet? N -  In the past six months, have you accidently leaked urine? N -  Do you have problems with loss of bowel control? N -  Managing your Medications? N -  Managing your Finances? N -  Housekeeping or managing your Housekeeping? N -  Some recent data might be hidden   Patient Care Team: Alen Bleacher, MD as PCP - General (Family Medicine) Freada Bergeron, MD as PCP - Cardiology  (Cardiology)   Assessment:   This is a routine wellness examination for Lonnie Skinner.  Exercise Activities and Dietary recommendations Current Exercise Habits: Structured exercise class, Type of exercise: strength training/weights;treadmill, Time (Minutes): > 60, Frequency (Times/Week): 3, Weekly Exercise (Minutes/Week): 0, Intensity: Moderate, Exercise limited by: orthopedic condition(s);cardiac condition(s)   Goals      Patient Stated     Get colonoscopy this year.        Fall Risk Fall Risk  07/13/2021 04/17/2021 09/28/2020 04/22/2019 01/04/2017  Falls in the past year? 1 0 0 0 No  Number falls in past yr: 0 0 - - -  Injury with Fall? 0 0 - - -  Risk for fall due to : Impaired balance/gait - Medication side effect - -  Follow up Falls prevention discussed - Falls prevention discussed;Education provided - -   Abnormal gait and balance due to orthopedic conditions. Patient declines using assistive devices to ambulate.  Is the patient's home free of loose throw rugs in walkways, pet beds, electrical cords, etc?   yes      Grab bars in the bathroom? yes      Handrails on the stairs?   yes      Adequate lighting?   yes  Timed Get Up and Go Performed: 8  Depression Screen PHQ 2/9 Scores 07/13/2021 12/01/2020 09/29/2020 09/14/2020  PHQ - 2 Score 1 1 2 2   PHQ- 9 Score - 1 5 4    Cognitive Function 6CIT Screen 07/13/2021  What Year? 0 points  What month? 0 points  What time? 0 points  Count back from 20 0 points  Months in reverse 0 points  Repeat phrase 0 points  Total Score 0   Immunization History  Administered Date(s) Administered   Influenza Whole 03/22/2009   Influenza,inj,Quad PF,6+ Mos 06/03/2013, 04/22/2019, 03/09/2020, 03/22/2021   Influenza-Unspecified 02/18/2017, 03/21/2018   PNEUMOCOCCAL CONJUGATE-20 04/05/2021   Pneumococcal Polysaccharide-23 09/28/2014   Tdap 11/19/2018   Qualifies for Shingles Vaccine? Yes   Shingrix Completed: No, Education has been provided  regarding the importance of this vaccine. Advised may receive this vaccine  at local pharmacy or Health Dept. Aware to provide a copy of the vaccination record if obtained from local pharmacy or Health Dept. Verbalized acceptance and understanding.   Screening Tests Health Maintenance  Topic Date Due   COVID-19 Vaccine (1) Never done   COLONOSCOPY (Pts 45-16yrs Insurance coverage will need to be confirmed)  Never done   Zoster Vaccines- Shingrix (1 of 2) Never done   TETANUS/TDAP  11/18/2028   INFLUENZA VACCINE  Completed   Hepatitis C Screening  Completed   HIV Screening  Completed   HPV VACCINES  Aged Out   Cancer Screenings: Lung: Low Dose CT Chest recommended if Age 8-80 years, 30 pack-year currently smoking OR have quit w/in 15years. Patient does not qualify. Colorectal: Never- referral has been placed and education provided.   Additional Screenings: Hepatitis C Screening: Completed HIV Screening: Completed   Plan:  FU with PCP as needed or for annual exam in November.  Schedule colonoscopy- call [facility] [phone] option 1. Consider covid vaccines.  Shingles vaccine can be sent to your pharmacy.   I have personally reviewed and noted the following in the patients chart:   Medical and social history Use of alcohol, tobacco or illicit drugs  Current medications and supplements Functional ability and status Nutritional status Physical activity Advanced directives List of other physicians Hospitalizations, surgeries, and ER visits in previous 12 months Vitals Screenings to include cognitive, depression, and falls Referrals and appointments  In addition, I have reviewed and discussed with patient certain preventive protocols, quality metrics, and best practice recommendations. A written personalized care plan for preventive services as well as general preventive health recommendations were provided to patient.  This visit was conducted virtually in the setting of the  [place] pandemic.   Dorna Bloom, [place]  07/20/2021    I have reviewed this visit and agree with the documentation.

## 2021-07-20 NOTE — Patient Instructions (Addendum)
You spoke to Lonnie Skinner, [place] over the phone for your annual wellness visit.  We discussed goals:   Goals      Patient Stated     Get colonoscopy this year.        We also discussed recommended health maintenance. As discussed, you are due for: Health Maintenance  Topic Date Due   COVID-19 Vaccine (1) Never done   COLONOSCOPY (Pts 45-5yrs Insurance coverage will need to be confirmed)  Never done   Zoster Vaccines- Shingrix (1 of 2) Never done   TETANUS/TDAP  11/18/2028   INFLUENZA VACCINE  Completed   Hepatitis C Screening  Completed   HIV Screening  Completed   HPV VACCINES  Aged Out   FU with PCP as needed or for annual exam in November.  Schedule colonoscopy- call [facility] [phone] option 1.  Consider covid vaccines.  Shingles vaccine can be sent to your pharmacy.   Preventive Care 62-28 Years Old, Male Preventive care refers to lifestyle choices and visits with your health care provider that can promote health and wellness. Preventive care visits are also called wellness exams. What can I expect for my preventive care visit? Counseling During your preventive care visit, your health care provider may ask about your: Medical history, including: Past medical problems. Family medical history. Current health, including: Emotional well-being. Home life and relationship well-being. Sexual activity. Lifestyle, including: Alcohol, nicotine or tobacco, and drug use. Access to firearms. Diet, exercise, and sleep habits. Safety issues such as seatbelt and bike helmet use. Sunscreen use. Work and work Statistician. Physical exam Your health care provider will check your: Height and weight. These may be used to calculate your BMI (body mass index). BMI is a measurement that tells if you are at a healthy weight. Waist circumference. This measures the distance around your waistline. This measurement also tells if you are at a healthy weight and may help predict your risk  of certain diseases, such as type 2 diabetes and high blood pressure. Heart rate and blood pressure. Body temperature. Skin for abnormal spots. What immunizations do I need? Vaccines are usually given at various ages, according to a schedule. Your health care provider will recommend vaccines for you based on your age, medical history, and lifestyle or other factors, such as travel or where you work. What tests do I need? Screening Your health care provider may recommend screening tests for certain conditions. This may include: Lipid and cholesterol levels. Diabetes screening. This is done by checking your blood sugar (glucose) after you have not eaten for a while (fasting). Hepatitis B test. Hepatitis C test. HIV (human immunodeficiency virus) test. STI (sexually transmitted infection) testing, if you are at risk. Lung cancer screening. Prostate cancer screening. Colorectal cancer screening. Talk with your health care provider about your test results, treatment options, and if necessary, the need for more tests. Follow these instructions at home: Eating and drinking  Eat a diet that includes fresh fruits and vegetables, whole grains, lean protein, and low-fat dairy products. Take vitamin and mineral supplements as recommended by your health care provider. Do not drink alcohol if your health care provider tells you not to drink. If you drink alcohol: Limit how much you have to 0-2 drinks a day. Know how much alcohol is in your drink. In the [place] one drink equals one 12 oz bottle of beer (355 mL), one 5 oz glass of wine (148 mL), or one 1 oz glass of hard liquor (44 mL). Lifestyle  Brush your teeth every morning and night with fluoride toothpaste. Floss one time each day. Exercise for at least 30 minutes 5 or more days each week. Do not use any products that contain nicotine or tobacco. These products include cigarettes, chewing tobacco, and vaping devices, such as e-cigarettes. If you  need help quitting, ask your health care provider. Do not use drugs. If you are sexually active, practice safe sex. Use a condom or other form of protection to prevent STIs. Take aspirin only as told by your health care provider. Make sure that you understand how much to take and what form to take. Work with your health care provider to find out whether it is safe and beneficial for you to take aspirin daily. Find healthy ways to manage stress, such as: Meditation, yoga, or listening to music. Journaling. Talking to a trusted person. Spending time with friends and family. Minimize exposure to UV radiation to reduce your risk of skin cancer. Safety Always wear your seat belt while driving or riding in a vehicle. Do not drive: If you have been drinking alcohol. Do not ride with someone who has been drinking. When you are tired or distracted. While texting. If you have been using any mind-altering substances or drugs. Wear a helmet and other protective equipment during sports activities. If you have firearms in your house, make sure you follow all gun safety procedures. What's next? Go to your health care provider once a year for an annual wellness visit. Ask your health care provider how often you should have your eyes and teeth checked. Stay up to date on all vaccines. This information is not intended to replace advice given to you by your health care provider. Make sure you discuss any questions you have with your health care provider. Document Revised: 11/02/2020 Document Reviewed: 11/02/2020 Elsevier Patient Education  2022 [place]'s number is [phone]. Please call with questions or concerns about what we discussed today.

## 2021-08-08 ENCOUNTER — Ambulatory Visit (INDEPENDENT_AMBULATORY_CARE_PROVIDER_SITE_OTHER): Payer: Medicare Other | Admitting: Cardiology

## 2021-08-08 ENCOUNTER — Encounter: Payer: Self-pay | Admitting: Cardiology

## 2021-08-08 ENCOUNTER — Other Ambulatory Visit: Payer: Self-pay

## 2021-08-08 VITALS — BP 132/84 | HR 64 | Ht 68.0 in | Wt 219.2 lb

## 2021-08-08 DIAGNOSIS — E782 Mixed hyperlipidemia: Secondary | ICD-10-CM

## 2021-08-08 DIAGNOSIS — I5032 Chronic diastolic (congestive) heart failure: Secondary | ICD-10-CM | POA: Diagnosis not present

## 2021-08-08 DIAGNOSIS — I872 Venous insufficiency (chronic) (peripheral): Secondary | ICD-10-CM | POA: Diagnosis not present

## 2021-08-08 DIAGNOSIS — I10 Essential (primary) hypertension: Secondary | ICD-10-CM | POA: Diagnosis not present

## 2021-08-08 DIAGNOSIS — I2510 Atherosclerotic heart disease of native coronary artery without angina pectoris: Secondary | ICD-10-CM

## 2021-08-08 DIAGNOSIS — N182 Chronic kidney disease, stage 2 (mild): Secondary | ICD-10-CM

## 2021-08-08 MED ORDER — FUROSEMIDE 40 MG PO TABS: 40.0000 mg | ORAL_TABLET | Freq: Every day | ORAL | 2 refills | Status: DC

## 2021-08-08 MED ORDER — ATORVASTATIN CALCIUM 80 MG PO TABS: 80.0000 mg | ORAL_TABLET | Freq: Every evening | ORAL | 2 refills | Status: DC

## 2021-08-08 MED ORDER — CLOPIDOGREL BISULFATE 75 MG PO TABS: 75.0000 mg | ORAL_TABLET | Freq: Every day | ORAL | 2 refills | Status: DC

## 2021-08-08 MED ORDER — CARVEDILOL 3.125 MG PO TABS: 3.1250 mg | ORAL_TABLET | Freq: Two times a day (BID) | ORAL | 2 refills | Status: DC

## 2021-08-08 NOTE — Patient Instructions (Signed)
Medication Instructions:  ? ?Your physician recommends that you continue on your current medications as directed. Please refer to the Current Medication list given to you today. ? ?*If you need a refill on your cardiac medications before your next appointment, please call your pharmacy* ? ? ? ?Follow-Up: ?At [facility], you and your health needs are our priority.  As part of our continuing mission to provide you with exceptional heart care, we have created designated Provider Care Teams.  These Care Teams include your primary Cardiologist (physician) and Advanced Practice Providers (APPs -  Physician Assistants and Nurse Practitioners) who all work together to provide you with the care you need, when you need it. ? ?We recommend signing up for the patient portal called "MyChart".  Sign up information is provided on this After Visit Summary.  MyChart is used to connect with patients for Virtual Visits (Telemedicine).  Patients are able to view lab/test results, encounter notes, upcoming appointments, etc.  Non-urgent messages can be sent to your provider as well.   ?To learn more about what you can do with MyChart, go to [email or web address].   ? ?Your next appointment:   ?6 month(s) ? ?The format for your next appointment:   ?In Person ? ?Provider:   ?Freada Bergeron, MD { ? ? ?

## 2021-08-08 NOTE — Progress Notes (Deleted)
?Cardiology Office Note:   ? ?Date:  08/08/2021  ? ?ID:  Lonnie Skinner, DOB 04-Jul-1960, MRN [id number] ? ?PCP:  Alen Bleacher, MD ?  ?Lonnie Skinner  ?Cardiologist:  Freada Bergeron, MD  ?Advanced Practice Provider:  No care team member to display ?Electrophysiologist:  None  ? ? ?Referring MD: Alen Bleacher, MD  ? ? ?History of Present Illness:   ? ?Lonnie Skinner is a 61 y.o. male with a hx of CAD/NSTEMI s/p DES to [facility] with residual moderate LAD disease in 9381, chronic diastolic heart failure, HTN, HLD, CKD who was previously followed seen by Dr. [name] who now returns to clinic for follow-up. ? ?Admitted back in early 2015 with CAD/NSTEMI - s/p DES to the mRCA, residual moderate LAD disease (if fails on medical therapy would proceed with PCI). He has typically not followed CV risk factor modification.  ? ?Was seen by me in clinic on 07/27/20 where he was having chest pain on exertion. Cath 08/02/20 showed critical 95% prox LAD, 70% OM1, 60% OM2, 70% prox RCA with abnormal FFR. He subsequently underwent CABG 07/2020 with LIMA-LAD, S-PDA, S-OM1, S-OM2 with Dr. Kipp Brood. TTE 08/12/20 with LVEF 60-65%, no WMA.  ? ?Last seen in 11/2020 where he was doing well. Was working through cardiac rehab. No chest pain. ? ? ? ?Past Medical History:  ?Diagnosis Date  ? CAD (coronary artery disease)   ? a. 05/2013: NSTEMI s/p DES to mRCA (culprit vessel), residual moderate LAD disease (the patient fails medical therapy and this was found to be significant, this would be amenable to PCI).  ? CHF (congestive heart failure) ([place])   ? CKD (chronic kidney disease), stage II   ? DVT (deep venous thrombosis) ([place]) 1981  ? Hypercholesteremia   ? Hyperglycemia   ? Hypertension   ? ? ?Past Surgical History:  ?Procedure Laterality Date  ? CORONARY ARTERY BYPASS GRAFT N/A 08/09/2020  ? Procedure: CORONARY ARTERY BYPASS GRAFTING (CABG) X  FOUR   USING  LEFT INTERNAL MAMMARY ARTERY HARVEST AND RIGHT AND LEFT ENDOSCOPIC  SAPHENOUS VEIN HARVEST;  Surgeon: [name], MD;  Location: [place];  Service: Open Heart Surgery;  Laterality: N/A;  ? CORONARY STENT PLACEMENT  05/2013  ? mRCA  ? CORONARY/GRAFT ACUTE MI REVASCULARIZATION  2015  ? INTRAVASCULAR PRESSURE WIRE/FFR STUDY N/A 08/02/2020  ? Procedure: INTRAVASCULAR PRESSURE WIRE/FFR STUDY;  Surgeon: [name], [name], MD;  Location: [place] CV LAB;  Service: Cardiovascular;  Laterality: N/A;  ? KNEE SURGERY Left 2009  ? LEFT HEART CATH AND CORONARY ANGIOGRAPHY N/A 08/02/2020  ? Procedure: LEFT HEART CATH AND CORONARY ANGIOGRAPHY;  Surgeon: [name], [name], MD;  Location: [place] CV LAB;  Service: Cardiovascular;  Laterality: N/A;  ? LEFT HEART CATHETERIZATION WITH CORONARY ANGIOGRAM N/A 06/02/2013  ? Procedure: LEFT HEART CATHETERIZATION WITH CORONARY ANGIOGRAM;  Surgeon: [name], MD;  Location: [facility] CATH LAB;  Service: Cardiovascular;  Laterality: N/A;  ? PERCUTANEOUS CORONARY STENT INTERVENTION (PCI-S)  06/02/2013  ? Procedure: PERCUTANEOUS CORONARY STENT INTERVENTION (PCI-S);  Surgeon: [name], MD;  Location: [facility] CATH LAB;  Service: Cardiovascular;;  ? TEE WITHOUT CARDIOVERSION N/A 08/09/2020  ? Procedure: TRANSESOPHAGEAL ECHOCARDIOGRAM (TEE);  Surgeon: [name], MD;  Location: [place];  Service: Open Heart Surgery;  Laterality: N/A;  ? ? ?Current Medications: ?Current Meds  ?Medication Sig  ? acetaminophen (TYLENOL) 325 MG tablet Take 2 tablets (650 mg total) by mouth every 6 (six) hours  as needed for mild pain, headache or fever.  ? ALPRAZolam (XANAX) 0.25 MG tablet TAKE 1 TABLET(0.25 MG) BY MOUTH TWICE DAILY AS NEEDED FOR ANXIETY  ? aspirin 81 MG EC tablet Take 1 tablet (81 mg total) by mouth daily.  ? atorvastatin (LIPITOR) 80 MG tablet Take 1 tablet (80 mg total) by mouth every evening.  ? benzonatate (TESSALON) 100 MG capsule Take 1 capsule (100 mg total) by mouth 2 (two) times daily as needed for cough.  ? carvedilol (COREG) 3.125 MG  tablet Take 1 tablet (3.125 mg total) by mouth 2 (two) times daily.  ? clopidogrel (PLAVIX) 75 MG tablet Take 1 tablet (75 mg total) by mouth daily.  ? furosemide (LASIX) 40 MG tablet Take 1 tablet (40 mg total) by mouth daily.  ? nitroGLYCERIN (NITROSTAT) 0.4 MG SL tablet Place 1 tablet (0.4 mg total) under the tongue every 5 (five) minutes as needed for chest pain (up to 3 doses).  ?  ? ?Allergies:   Penicillins  ? ?Social History  ? ?Socioeconomic History  ? Marital status: Widowed  ?  Spouse name: Not on file  ? Number of children: 2  ? Years of education: 8  ? Highest education level: 11th grade  ?Occupational History  ?  Comment: Disabled  ?Tobacco Use  ? Smoking status: Former  ?  Passive exposure: Past  ? Smokeless tobacco: Never  ? Tobacco comments:  ?  Quit ~2007  ?Vaping Use  ? Vaping Use: Never used  ?Substance and Sexual Activity  ? Alcohol use: No  ? Drug use: No  ? Sexual activity: Not Currently  ?  Birth control/protection: None  ?Other Topics Concern  ? Not on file  ?Social History Narrative  ? Patient lives alone in. Patients wife died ~1 year ago.   ? Patient has support from his son and some friends.   ? 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   ? Patient recently graduated cardiac rehab and has been going to [employer] 3x per week for ~ 2 hours.   ? ?Social Determinants of Health  ? ?Financial Resource Strain: Low Risk   ? Difficulty of Paying Living Expenses: Not hard at all  ?Food Insecurity: No Food Insecurity  ? Worried About Charity fundraiser in the Last Year: Never true  ? Ran Out of Food in the Last Year: Never true  ?Transportation Needs: No Transportation Needs  ? Lack of Transportation (Medical): No  ? Lack of Transportation (Non-Medical): No  ?Physical Activity: Sufficiently Active  ? Days of Exercise per Week: 3 days  ? Minutes of Exercise per Session: 120 min  ?Stress: No Stress Concern Present  ? Feeling of Stress : Not at all  ?Social Connections: Socially  Isolated  ? Frequency of Communication with Friends and Family: More than three times a week  ? Frequency of Social Gatherings with Friends and Family: More than three times a week  ? Attends Religious Services: Never  ? Active Member of Clubs or Organizations: No  ? Attends Archivist Meetings: Never  ? Marital Status: Widowed  ?  ? ?Family History: ?The patient's family history includes CAD in an other family member; Cancer in his father; Diabetes in his mother; Heart attack in his brother and father; Heart disease in his brother; Heart failure in his father. ? ?ROS:   ?Please see the history of present illness.    ?Review of Systems  ?Constitutional:  Negative for chills and fever.  ?HENT:  Negative for hearing loss.   ?Eyes:  Negative for blurred vision and redness.  ?Respiratory:  Negative for shortness of breath.   ?Cardiovascular:  Negative for chest pain, palpitations, orthopnea, claudication, leg swelling and PND.  ?Gastrointestinal:  Negative for melena, nausea and vomiting.  ?Genitourinary:  Negative for dysuria and flank pain.  ?Neurological:  Negative for dizziness and loss of consciousness.  ?Endo/Heme/Allergies:  Negative for polydipsia.  ?Psychiatric/Behavioral:  Positive for depression.   ? ?EKGs/Labs/Other Studies Reviewed:   ? ?The following studies were reviewed today: ?Cath 08/02/20: ?Prox LAD lesion is 95% stenosed. ?Prox LAD to Mid LAD lesion is 80% stenosed. ?1st Mrg lesion is 75% stenosed. ?2nd Mrg lesion is 60% stenosed. ?Prox RCA to Mid RCA lesion is 70% stenosed. ?Previously placed Mid RCA-1 stent (unknown type) is widely patent. ?Mid RCA-2 lesion is 50% stenosed. ?The left ventricular systolic function is normal. ?LV end diastolic pressure is mildly elevated. ?The left ventricular ejection fraction is 55-65% by visual estimate. ?  ?1. 3 vessel obstructive CAD ?   - critical 95% proximal LAD ?   - 70% OM1 ?   - 60% OM2 ?   - 70% mid RCA proximal to prior stent with abnormal  RFR ?2. Normal LV function ?3. Mildly elevated LVEDP ?  ?Plan: recommend CABG. Will hold Plavix. Admit to telemetry and treat with IV heparin. Check Echo. ? ? ?TTE 08/02/20: ?IMPRESSIONS  ? ? ? 1. Left ventricular eje

## 2021-08-08 NOTE — Progress Notes (Signed)
?Cardiology Office Note:   ? ?Date:  08/08/2021  ? ?ID:  Lonnie Skinner, DOB 1960/07/26, MRN [id number] ? ?PCP:  Lonnie Bleacher, MD ?  ?[place]  ?Cardiologist:  Freada Bergeron, MD  ?Advanced Practice Provider:  No care team member to display ?Electrophysiologist:  None  ? ? ?Referring MD: Lonnie Bleacher, MD  ? ? ?History of Present Illness:   ? ?Lonnie Skinner is a 61 y.o. male with a hx of CAD/NSTEMI s/p DES to [facility] with residual moderate LAD disease in 3267, chronic diastolic heart failure, HTN, HLD, CKD who was previously followed seen by Dr. Meda Skinner who now returns to clinic for follow-up. ? ?Admitted back in early 2015 with CAD/NSTEMI - s/p DES to the mRCA, residual moderate LAD disease (if fails on medical therapy would proceed with PCI). He has typically not followed CV risk factor modification.  ? ?Was seen by me in clinic on 07/27/20 where he was having chest pain on exertion. Cath 08/02/20 showed critical 95% prox LAD, 70% OM1, 60% OM2, 70% prox RCA with abnormal FFR. He subsequently underwent CABG 07/2020 with LIMA-LAD, S-PDA, S-OM1, S-OM2 with Dr. Kipp Skinner. TTE 08/12/20 with LVEF 60-65%, no WMA.  ? ?Last seen in 11/2020 where he was doing well. Was working through cardiac rehab. No chest pain. ? ?Today, he is doing well. However, he has not been active recently, but states he is going to try. He is compliant with his medications. However, he is frustrated with urinary frequency when using Lasix. No chest pain, SOB, orthopnea, PND, lightheadedness or dizziness. Has chronic LE edema that is well controlled with lasix.  ? ?He does not record his blood pressure regularly. His blood pressure at his last doctor's visit was reportedly normal. He had his flu and pneumococcal vaccines but has not yet received his shingles vaccine.   ? ?He denies chest pain, chest pressure, dyspnea at rest or with exertion, palpitations, PND, or orthopnea. Denies cough, fever, chills. Denies nausea,  vomiting. Denies syncope or presyncope. Denies dizziness or lightheadedness. Denies snoring. ? ?Past Medical History:  ?Diagnosis Date  ? CAD (coronary artery disease)   ? a. 05/2013: NSTEMI s/p DES to mRCA (culprit vessel), residual moderate LAD disease (the patient fails medical therapy and this was found to be significant, this would be amenable to PCI).  ? CHF (congestive heart failure) ([place])   ? CKD (chronic kidney disease), stage II   ? DVT (deep venous thrombosis) ([place]) 1981  ? Hypercholesteremia   ? Hyperglycemia   ? Hypertension   ? ? ?Past Surgical History:  ?Procedure Laterality Date  ? CORONARY ARTERY BYPASS GRAFT N/A 08/09/2020  ? Procedure: CORONARY ARTERY BYPASS GRAFTING (CABG) X  FOUR   USING  LEFT INTERNAL MAMMARY ARTERY HARVEST AND RIGHT AND LEFT ENDOSCOPIC SAPHENOUS VEIN HARVEST;  Surgeon: [name], MD;  Location: [place];  Service: Open Heart Surgery;  Laterality: N/A;  ? CORONARY STENT PLACEMENT  05/2013  ? mRCA  ? CORONARY/GRAFT ACUTE MI REVASCULARIZATION  2015  ? INTRAVASCULAR PRESSURE WIRE/FFR STUDY N/A 08/02/2020  ? Procedure: INTRAVASCULAR PRESSURE WIRE/FFR STUDY;  Surgeon: [name], [name], MD;  Location: [place] CV LAB;  Service: Cardiovascular;  Laterality: N/A;  ? KNEE SURGERY Left 2009  ? LEFT HEART CATH AND CORONARY ANGIOGRAPHY N/A 08/02/2020  ? Procedure: LEFT HEART CATH AND CORONARY ANGIOGRAPHY;  Surgeon: [name], [name], MD;  Location: [place] CV LAB;  Service: Cardiovascular;  Laterality: N/A;  ?  LEFT HEART CATHETERIZATION WITH CORONARY ANGIOGRAM N/A 06/02/2013  ? Procedure: LEFT HEART CATHETERIZATION WITH CORONARY ANGIOGRAM;  Surgeon: Lonnie Booze, MD;  Location: [facility] CATH LAB;  Service: Cardiovascular;  Laterality: N/A;  ? PERCUTANEOUS CORONARY STENT INTERVENTION (PCI-S)  06/02/2013  ? Procedure: PERCUTANEOUS CORONARY STENT INTERVENTION (PCI-S);  Surgeon: Lonnie Booze, MD;  Location: [facility] CATH LAB;  Service: Cardiovascular;;  ? TEE WITHOUT CARDIOVERSION N/A  08/09/2020  ? Procedure: TRANSESOPHAGEAL ECHOCARDIOGRAM (TEE);  Surgeon: [name], MD;  Location: [place];  Service: Open Heart Surgery;  Laterality: N/A;  ? ? ?Current Medications: ?Current Meds  ?Medication Sig  ? acetaminophen (TYLENOL) 325 MG tablet Take 2 tablets (650 mg total) by mouth every 6 (six) hours as needed for mild pain, headache or fever.  ? ALPRAZolam (XANAX) 0.25 MG tablet TAKE 1 TABLET(0.25 MG) BY MOUTH TWICE DAILY AS NEEDED FOR ANXIETY  ? aspirin 81 MG EC tablet Take 1 tablet (81 mg total) by mouth daily.  ? benzonatate (TESSALON) 100 MG capsule Take 1 capsule (100 mg total) by mouth 2 (two) times daily as needed for cough.  ? nitroGLYCERIN (NITROSTAT) 0.4 MG SL tablet Place 1 tablet (0.4 mg total) under the tongue every 5 (five) minutes as needed for chest pain (up to 3 doses).  ? [DISCONTINUED] atorvastatin (LIPITOR) 80 MG tablet Take 1 tablet (80 mg total) by mouth every evening.  ? [DISCONTINUED] carvedilol (COREG) 3.125 MG tablet Take 1 tablet (3.125 mg total) by mouth 2 (two) times daily.  ? [DISCONTINUED] clopidogrel (PLAVIX) 75 MG tablet Take 1 tablet (75 mg total) by mouth daily.  ? [DISCONTINUED] furosemide (LASIX) 40 MG tablet Take 1 tablet (40 mg total) by mouth daily.  ?  ? ?Allergies:   Penicillins  ? ?Social History  ? ?Socioeconomic History  ? Marital status: Widowed  ?  Spouse name: Not on file  ? Number of children: 2  ? Years of education: 70  ? Highest education level: 11th grade  ?Occupational History  ?  Comment: Disabled  ?Tobacco Use  ? Smoking status: Former  ?  Passive exposure: Past  ? Smokeless tobacco: Never  ? Tobacco comments:  ?  Quit ~2007  ?Vaping Use  ? Vaping Use: Never used  ?Substance and Sexual Activity  ? Alcohol use: No  ? Drug use: No  ? Sexual activity: Not Currently  ?  Birth control/protection: None  ?Other Topics Concern  ? Not on file  ?Social History Narrative  ? Patient lives alone in. Patients wife died ~1 year ago.   ? Patient has  support from his son and some friends.   ? 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   ? Patient recently graduated cardiac rehab and has been going [facility] [employer] [facility]x per week for ~ 2 hours.   ? ?Social Determinants of Health  ? ?Financial Resource Strain: Low Risk   ? Difficulty of Paying Living Expenses: Not hard at all  ?Food Insecurity: No Food Insecurity  ? Worried About Charity fundraiser in the Last Year: Never true  ? Ran Out of Food in the Last Year: Never true  ?Transportation Needs: No Transportation Needs  ? Lack of Transportation (Medical): No  ? Lack of Transportation (Non-Medical): No  ?Physical Activity: Sufficiently Active  ? Days of Exercise per Week: 3 days  ? Minutes of Exercise per Session: 120 min  ?Stress: No Stress Concern Present  ? Feeling of Stress :  Not at all  ?Social Connections: Socially Isolated  ? Frequency of Communication with Friends and Family: More than three times a week  ? Frequency of Social Gatherings with Friends and Family: More than three times a week  ? Attends Religious Services: Never  ? Active Member of Clubs or Organizations: No  ? Attends Archivist Meetings: Never  ? Marital Status: Widowed  ?  ? ?Family History: ?The patient's family history includes CAD in an other family member; Cancer in his father; Diabetes in his mother; Heart attack in his brother and father; Heart disease in his brother; Heart failure in his father. ? ?ROS:   ?Please see the history of present illness.    ?Review of Systems  ?Constitutional:  Negative for chills and fever.  ?HENT:  Negative for hearing loss.   ?Eyes:  Negative for blurred vision and redness.  ?Respiratory:  Negative for shortness of breath.   ?Cardiovascular:  Positive for leg swelling (Bilateral LE). Negative for chest pain, palpitations, orthopnea, claudication and PND.  ?Gastrointestinal:  Negative for melena, nausea and vomiting.  ?Genitourinary:  Negative for dysuria and flank pain.   ?Musculoskeletal:  Negative for myalgias.  ?Skin:  Negative for rash.  ?Neurological:  Negative for dizziness and loss of consciousness.  ?Endo/Heme/Allergies:  Negative for polydipsia.  ?Psychiatric/Behavioral:  Negative fo

## 2021-08-09 ENCOUNTER — Telehealth: Payer: Self-pay

## 2021-08-09 NOTE — Telephone Encounter (Signed)
I did a medicare wellness visit with patient.  ? ?Patient is due for Shingrix Vaccine.  ? ?Will forward to PCP to send to his pharmacy.  ? ?

## 2021-08-10 ENCOUNTER — Other Ambulatory Visit: Payer: Self-pay | Admitting: Student

## 2021-08-11 ENCOUNTER — Telehealth: Payer: Self-pay | Admitting: *Deleted

## 2021-08-11 DIAGNOSIS — I5033 Acute on chronic diastolic (congestive) heart failure: Secondary | ICD-10-CM

## 2021-08-11 NOTE — Telephone Encounter (Signed)
Spoke with patient about cost of [place]. It is not affordable. I advised that he could apply for patient assistance. Patient proceeded to go on a tangent about his K level and that he cant use the cheap medication. He states he had labs in Jan at [facility] medicine (last labs are from nov 22). Advised we could repeat his K. He then went on a tangent about his furosemide and making him pee and why would he have to take those medications plus a new medication. I explained to him how each of them work.  ?He will come in for repeat BMP on Monday. If his K is still high, he will consider filling out patient assistance paperwork.  ?

## 2021-08-11 NOTE — Addendum Note (Signed)
Addended by: Marcelle Overlie D on: 08/11/2021 04:11 PM ? ? Modules accepted: Orders ? ?

## 2021-08-11 NOTE — Telephone Encounter (Signed)
Left the pt a message to call the office back to discuss medication cost per Pharmacist and Dr. [name].  Left him a message that when he calls back he can ask for Pharmacist in Lipid clinic or myself, for further assistance with this.  ?

## 2021-08-11 NOTE — Telephone Encounter (Signed)
-----   Message from Freada Bergeron, MD sent at 08/11/2021 12:36 PM EDT ----- ?Thank you so much for checking. ? ?Karlene Einstein, can we see if this cost is acceptable to him? ?----- Message ----- ?From: Rockne Menghini, RPH-CPP ?Sent: 08/09/2021   5:14 PM EDT ?To: Freada Bergeron, MD ? ?Heather ? ?Wilder Glade would be $47/month until he hits the coverage gap (looking at his med list, probably after 8-9 months), then would jump to about $150/month for the remainder of the year.   He might make it through 2023 without hitting that gap at this point.  Vania Rea is about the same.   ? ?Erasmo Downer ?----- Message ----- ?From: Freada Bergeron, MD ?Sent: 08/08/2021   2:49 PM EDT ?To: Cv Div Pharmd ? ?Can we check to see how much farxiga would cost him? He is very concerned about cost.  ? ?Thank you! ? ? ? ?

## 2021-08-14 ENCOUNTER — Other Ambulatory Visit: Payer: Medicare Other | Admitting: *Deleted

## 2021-08-14 ENCOUNTER — Telehealth: Payer: Self-pay | Admitting: *Deleted

## 2021-08-14 ENCOUNTER — Encounter: Payer: Self-pay | Admitting: Gastroenterology

## 2021-08-14 ENCOUNTER — Other Ambulatory Visit: Payer: Self-pay

## 2021-08-14 ENCOUNTER — Ambulatory Visit (INDEPENDENT_AMBULATORY_CARE_PROVIDER_SITE_OTHER): Payer: Medicare Other | Admitting: Gastroenterology

## 2021-08-14 VITALS — BP 136/80 | HR 72 | Ht 68.0 in | Wt 221.8 lb

## 2021-08-14 DIAGNOSIS — L290 Pruritus ani: Secondary | ICD-10-CM

## 2021-08-14 DIAGNOSIS — Z7902 Long term (current) use of antithrombotics/antiplatelets: Secondary | ICD-10-CM

## 2021-08-14 DIAGNOSIS — I5033 Acute on chronic diastolic (congestive) heart failure: Secondary | ICD-10-CM | POA: Diagnosis not present

## 2021-08-14 LAB — BASIC METABOLIC PANEL
BUN/Creatinine Ratio: 13 (ref 10–24)
BUN: 16 mg/dL (ref 8–27)
CO2: 26 mmol/L (ref 20–29)
Calcium: 9.2 mg/dL (ref 8.6–10.2)
Chloride: 102 mmol/L (ref 96–106)
Creatinine, Ser: 1.28 mg/dL — ABNORMAL HIGH (ref 0.76–1.27)
Glucose: 97 mg/dL (ref 70–99)
Potassium: 4.7 mmol/L (ref 3.5–5.2)
Sodium: 138 mmol/L (ref 134–144)
eGFR: 64 mL/min/{1.73_m2} (ref >59)

## 2021-08-14 NOTE — Progress Notes (Signed)
? ?Referring Provider: Alen Bleacher, MD ?Primary Care Physician:  Alen Bleacher, MD ? ?Reason for Consultation:  Colon cancer screening on Plavix ? ? ?IMPRESSION:  ?Need for colon cancer screening ? ?Colonoscopy recommended.  Hold Plavix for 5 days before endoscopy.  I discussed with the patient that there is a low, but real, risk of a cardiovascular event such as heart attack, stroke, or embolism/thrombosis while off Plavix. Will communicate by phone or EMR with patient's prescribing provider to confirm that holding the Plavix is appropriate at this time.   ? ?Anal itching: Rectal exam deferred to time of colonoscopy.  ? ?PLAN: ?- Colonoscopy after a Plavix washout ?- See patient instructions for recommendations about anal itching ? ? ?HPI: Lonnie Skinner is a 61 y.o. male referred for colon cancer screening.  The history is obtained through the patient and review of his electronic health record. He has CAD/NSTEMI, chronic diastolic heart failure, HTN, HLD, CKD. He is on chronic Plavix.  Last echo 3/22 showed LVEF 60-65%. ? ?Colonoscopy in 2003 at the [place] was normal. He remembers being awake. He would like to proceed with surveillance colonoscopy now.  ? ?GI ROS is negative except for several years of intermittent rectal itching, particularly after a bowel movement. Happens intermittently. Doesn't happen frequently enough that he feels that treatment is indicated.  No blood or mucous in the stool. No rectal pain. There may be an associated rectal fullness.  ? ?There is no known family history of colon cancer or polyps. No family history of stomach cancer or other GI malignancy. No family history of inflammatory bowel disease or celiac.  ? ? ?Past Medical History:  ?Diagnosis Date  ? CAD (coronary artery disease)   ? a. 05/2013: NSTEMI s/p DES to mRCA (culprit vessel), residual moderate LAD disease (the patient fails medical therapy and this was found to be significant, this would be amenable to PCI).  ? CHF  (congestive heart failure) ([place])   ? CKD (chronic kidney disease), stage II   ? DVT (deep venous thrombosis) ([place]) 1981  ? Hypercholesteremia   ? Hyperglycemia   ? Hypertension   ? ? ?Past Surgical History:  ?Procedure Laterality Date  ? CORONARY ARTERY BYPASS GRAFT N/A 08/09/2020  ? Procedure: CORONARY ARTERY BYPASS GRAFTING (CABG) X  FOUR   USING  LEFT INTERNAL MAMMARY ARTERY HARVEST AND RIGHT AND LEFT ENDOSCOPIC SAPHENOUS VEIN HARVEST;  Surgeon: [name], MD;  Location: [place];  Service: Open Heart Surgery;  Laterality: N/A;  ? CORONARY STENT PLACEMENT  05/2013  ? mRCA  ? CORONARY/GRAFT ACUTE MI REVASCULARIZATION  2015  ? INTRAVASCULAR PRESSURE WIRE/FFR STUDY N/A 08/02/2020  ? Procedure: INTRAVASCULAR PRESSURE WIRE/FFR STUDY;  Surgeon: [name], [name], MD;  Location: [place] [facility];  Service: Cardiovascular;  Laterality: N/A;  ? KNEE SURGERY Left 2009  ? LEFT HEART CATH AND CORONARY ANGIOGRAPHY N/A 08/02/2020  ? Procedure: LEFT HEART CATH AND CORONARY ANGIOGRAPHY;  Surgeon: [name], [name], MD;  Location: [place] [facility];  Service: Cardiovascular;  Laterality: N/A;  ? LEFT HEART CATHETERIZATION WITH CORONARY ANGIOGRAM N/A 06/02/2013  ? Procedure: LEFT HEART CATHETERIZATION WITH CORONARY ANGIOGRAM;  Surgeon: [name], MD;  [facility] CATH LAB;  Service: Cardiovascular;  Laterality: N/A;  ? PERCUTANEOUS CORONARY STENT INTERVENTION (PCI-S)  06/02/2013  ? Procedure: PERCUTANEOUS CORONARY STENT INTERVENTION (PCI-S);  Surgeon: [name], MD;  Location: [facility] CATH LAB;  Service: Cardiovascular;;  ? TEE WITHOUT CARDIOVERSION N/A 08/09/2020  ?  Procedure: TRANSESOPHAGEAL ECHOCARDIOGRAM (TEE);  Surgeon: [name], MD;  Location: [place];  Service: Open Heart Surgery;  Laterality: N/A;  ? ? ? ?Current Outpatient Medications  ?Medication Sig Dispense Refill  ? ALPRAZolam (XANAX) 0.25 MG tablet TAKE 1 TABLET(0.25 MG) BY MOUTH TWICE DAILY AS NEEDED FOR ANXIETY 60 tablet 0  ? aspirin 81 MG  EC tablet Take 1 tablet (81 mg total) by mouth daily. 30 tablet   ? atorvastatin (LIPITOR) 80 MG tablet Take 1 tablet (80 mg total) by mouth every evening. 90 tablet 2  ? carvedilol (COREG) 3.125 MG tablet Take 1 tablet (3.125 mg total) by mouth 2 (two) times daily. 180 tablet 2  ? clopidogrel (PLAVIX) 75 MG tablet Take 1 tablet (75 mg total) by mouth daily. 90 tablet 2  ? furosemide (LASIX) 40 MG tablet Take 1 tablet (40 mg total) by mouth daily. 90 tablet 2  ? nitroGLYCERIN (NITROSTAT) 0.4 MG SL tablet Place 1 tablet (0.4 mg total) under the tongue every 5 (five) minutes as needed for chest pain (up to 3 doses). 25 tablet 3  ? traMADol (ULTRAM) 50 MG tablet Take 1 tablet (50 mg total) by mouth every 4 (four) hours as needed for moderate pain. 30 tablet 0  ? ?No current facility-administered medications for this visit.  ? ? ?Allergies as of 08/14/2021 - Review Complete 08/14/2021  ?Allergen Reaction Noted  ? Penicillins  08/02/2020  ? ? ?Family History  ?Problem Relation Age of Onset  ? Diabetes Mother   ? Heart failure Father   ? Heart attack Father   ? Bone cancer Father   ? Heart disease Brother   ?     cabg  ? Heart attack Brother   ? CAD Other   ?     Multiple family members with CABG  ? Colon cancer Neg Hx   ? Pancreatic cancer Neg Hx   ? Liver disease Neg Hx   ? Esophageal cancer Neg Hx   ? ? ?Social History  ? ?Socioeconomic History  ? Marital status: Widowed  ?  Spouse name: Not on file  ? Number of children: 2  ? Years of education: 74  ? Highest education level: 11th grade  ?Occupational History  ?  Comment: Disabled  ?Tobacco Use  ? Smoking status: Former  ?  Types: Cigarettes  ?  Quit date: 2007  ?  Years since quitting: 16.2  ?  Passive exposure: Past  ? Smokeless tobacco: Never  ?Vaping Use  ? Vaping Use: Never used  ?Substance and Sexual Activity  ? Alcohol use: No  ? Drug use: No  ? Sexual activity: Not Currently  ?  Birth control/protection: None  ?Other Topics Concern  ? Not on file  ?Social  History Narrative  ? Patient lives alone in. Patients wife died ~1 year ago.   ? Patient has support from his son and some friends.   ? 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   ? Patient recently graduated cardiac rehab and has been going to [employer] 3x per week for ~ 2 hours.   ? ?Social Determinants of Health  ? ?Financial Resource Strain: Low Risk   ? Difficulty of Paying Living Expenses: Not hard at all  ?Food Insecurity: No Food Insecurity  ? Worried About Charity fundraiser in the Last Year: Never true  ? Ran Out of Food in the Last Year: Never true  ?Transportation Needs: No Transportation  Needs  ? Lack of Transportation (Medical): No  ? Lack of Transportation (Non-Medical): No  ?Physical Activity: Sufficiently Active  ? Days of Exercise per Week: 3 days  ? Minutes of Exercise per Session: 120 min  ?Stress: No Stress Concern Present  ? Feeling of Stress : Not at all  ?Social Connections: Socially Isolated  ? Frequency of Communication with Friends and Family: More than three times a week  ? Frequency of Social Gatherings with Friends and Family: More than three times a week  ? Attends Religious Services: Never  ? Active Member of Clubs or Organizations: No  ? Attends Archivist Meetings: Never  ? Marital Status: Widowed  ?Intimate Partner Violence: Not At Risk  ? Fear of Current or Ex-Partner: No  ? Emotionally Abused: No  ? Physically Abused: No  ? Sexually Abused: No  ? ? ?Review of Systems: ?12 system ROS is negative except as noted above.  ? ?Physical Exam: ?General:   Alert,  well-nourished, pleasant and cooperative in NAD ?Head:  Normocephalic and atraumatic. ?Eyes:  Sclera clear, no icterus.   Conjunctiva pink. ?Ears:  Normal auditory acuity. ?Nose:  No deformity, discharge,  or lesions. ?Mouth:  No deformity or lesions.   ?Neck:  Supple; no masses or thyromegaly. ?Lungs:  Clear throughout to auscultation.   No wheezes. ?Heart:  Regular rate and rhythm; no  murmurs. ?Abdomen:  Soft, nontender, nondistended, normal bowel sounds, no rebound or guarding. No hepatosplenomegaly.   ?Rectal:  Deferred to time of colonoscopy ?Msk:  Symmetrical. No boney deformities ?LAD: No inguinal or um

## 2021-08-14 NOTE — Patient Instructions (Addendum)
It was my pleasure to provide care to you today. Based on our discussion, I am providing you with my recommendations below: ? ?RECOMMENDATION(S):  ? ?I have recommended a colonoscopy. We will need to coordinate with the doctor who prescribes your Plavix to work to keep you safe off the Plavix for the procedure. There is a low, but real, risk of a cardiovascular event such as heart attack, stroke, or embolism/thrombosis while off Plavix.  ? ?Given your itching, I recommend: ?* Keep your bottom clean ?* Avoid moisture in the anal area ?* Apply zinc oxide to the anal area ?* Avoid tight fitting clothes ?* Wear cotton underpants ?* Avoid coffee, cola, beer, tomatoes, chocolate, tea, and citrus fruits as these can exacerbate anal itching ? ?COLONOSCOPY:  ? ?You have been scheduled for a colonoscopy. Please follow written instructions given to you at your visit today.  ? ?PREP:  ? ?Please pick up your prep supplies at the pharmacy within the next 1-3 days. ? ?INHALERS:  ? ?If you use inhalers (even only as needed), please bring them with you on the day of your procedure. ? ?MEDICATIONS TO HOLD: ? ?We will contact your provider to request permission for you to hold Plavix. Once we receive a response, you will be contacted by our office. If you do not hear from our office 1 week prior to your scheduled procedure, please contact our office at [phone].  ? ?COLONOSCOPY TIPS: ? ?To reduce nausea and dehydration, stay well hydrated for 3-4 days prior to the exam.  ?To prevent skin/hemorrhoid irritation - prior to wiping, put A&Dointment or vaseline on the toilet paper. ?Keep a towel or pad on the bed.  ?BEFORE STARTING YOUR PREP, drink  64oz of clear liquids in the morning. This will help to flush the colon and will ensure you are well hydrated!!!!  ?NOTE - This is in addition to the fluids required for to complete your prep. ?Use of a flavored hard candy, such as grape Anise Salvo, can counteract some of the flavor of  the prep and may prevent some nausea.  ? ? ?FOLLOW UP: ? ?After your procedure, you will receive a call from my office staff regarding my recommendation for follow up. ? ?BMI: ? ?If you are age 62 or younger, your body mass index should be between 19-25. Your Body mass index is 33.72 kg/m?Marland Kitchen If this is out of the aformentioned range listed, please consider follow up with your Primary Care Provider.  ? ?MY CHART: ? ?The [facility] providers would like to encourage you to use [facility] to communicate with providers for non-urgent requests or questions.  Due to long hold times on the telephone, sending your provider a message by [facility] may be a faster and more efficient way to get a response.  Please allow 48 business hours for a response.  Please remember that this is for non-urgent requests.  ? ?Thank you for trusting me with your gastrointestinal care!   ? ?Thornton Park, MD, MPH ? ?

## 2021-08-15 ENCOUNTER — Telehealth: Payer: Self-pay | Admitting: *Deleted

## 2021-08-15 MED ORDER — SPIRONOLACTONE 25 MG PO TABS: 12.5000 mg | ORAL_TABLET | Freq: Every day | ORAL | 3 refills | Status: DC

## 2021-08-15 NOTE — Telephone Encounter (Signed)
-----   Message from Nuala Alpha, LPN sent at 8/55/0158 12:36 PM EDT ----- ?Please advise which med to start and dose. ? ?Thanks so much, ?Briell Paulette  ? ? ?----- Message ----- ?From: Ramond Dial, RPH-CPP ?Sent: 08/15/2021  10:39 AM EDT ?To: Nuala Alpha, LPN, Freada Bergeron, MD ? ?BMP ordered because patient wanted to see if his K would be lower. He wanted to start on spironolactone instead of [place]. [facility] is not affordable and he was hesitant about applying for patient assistance. ? ?

## 2021-08-15 NOTE — Telephone Encounter (Signed)
Spoke with Dr. [name] about med management for this pt,and to advise on an alternative regimen in place of farxiga,  due to cost.  ? ?Per Dr. [name], pt should start taking spironolactone 12.5 mg po daily in place of farxiga, being his BMET result came back normal and more cost effective.  ? ?Confirmed the pharmacy of choice with the pt.  ?Pt verbalized understanding and agrees with this plan. ?

## 2021-08-16 NOTE — Telephone Encounter (Signed)
[facility] Pre-operative Risk Assessment  ?   ?Request for surgical clearance:     Endoscopy Procedure ? ?What type of surgery is being performed?     Colonoscopy ? ?When is this surgery scheduled?     Tuesday 09/26/21 ? ?What type of clearance is required ?   Pharmacy ? ?Are there any medications that need to be held prior to surgery and how long? Plavix 5 days ? ?Practice name and name of physician performing surgery?      [facility] ? ?What is your office phone and fax number?      Phone- [phone]  Fax- [phone] ? ?Anesthesia type (None, local, MAC, general) ?       MAC ? ?

## 2021-08-17 NOTE — Telephone Encounter (Signed)
? ?  Primary Cardiologist: Freada Bergeron, MD ? ?Chart reviewed as part of pre-operative protocol coverage. Given past medical history and time since last visit, based on ACC/AHA guidelines, WHITMAN MEINHARDT would be at acceptable risk for the planned procedure without further cardiovascular testing.  ? ?His Plavix may be held for 5 days prior to his procedure.  Please resume as soon as hemostasis is achieved.  His aspirin will need to be continued throughout the procedure. ? ?I will route this recommendation to the requesting party via Epic fax function and remove from pre-op pool. ? ?Please call with questions. ? ?Jossie Ng. Maddelynn Moosman NP-C ? ?  ?08/17/2021, 7:51 AM ?[place] ?[address] ?Office [phone] Fax [phone] ? ? ? ? ?

## 2021-08-30 NOTE — Telephone Encounter (Signed)
Patient informed he can hold his Plavix.  ?

## 2021-09-13 ENCOUNTER — Ambulatory Visit: Payer: Medicare Other | Admitting: Cardiology

## 2021-09-19 ENCOUNTER — Encounter: Payer: Self-pay | Admitting: Gastroenterology

## 2021-09-26 ENCOUNTER — Ambulatory Visit (AMBULATORY_SURGERY_CENTER): Payer: Medicare Other | Admitting: Gastroenterology

## 2021-09-26 ENCOUNTER — Encounter: Payer: Self-pay | Admitting: Gastroenterology

## 2021-09-26 VITALS — BP 130/78 | HR 71 | Temp 98.7°F | Resp 13 | Ht 68.0 in | Wt 221.0 lb

## 2021-09-26 DIAGNOSIS — D123 Benign neoplasm of transverse colon: Secondary | ICD-10-CM

## 2021-09-26 DIAGNOSIS — D124 Benign neoplasm of descending colon: Secondary | ICD-10-CM | POA: Diagnosis not present

## 2021-09-26 DIAGNOSIS — Z1211 Encounter for screening for malignant neoplasm of colon: Secondary | ICD-10-CM | POA: Diagnosis not present

## 2021-09-26 DIAGNOSIS — I509 Heart failure, unspecified: Secondary | ICD-10-CM | POA: Diagnosis not present

## 2021-09-26 DIAGNOSIS — I2510 Atherosclerotic heart disease of native coronary artery without angina pectoris: Secondary | ICD-10-CM | POA: Diagnosis not present

## 2021-09-26 DIAGNOSIS — L290 Pruritus ani: Secondary | ICD-10-CM

## 2021-09-26 MED ORDER — SODIUM CHLORIDE 0.9 % IV SOLN: 500.0000 mL | Freq: Once | INTRAVENOUS | Status: DC

## 2021-09-26 NOTE — Progress Notes (Signed)
Called to room to assist during endoscopic procedure.  Patient ID and intended procedure confirmed with present staff. Received instructions for my participation in the procedure from the performing physician.  

## 2021-09-26 NOTE — Progress Notes (Signed)
A and O x3. Report to RN. Tolerated MAC anesthesia well.  ?

## 2021-09-26 NOTE — Progress Notes (Signed)
? ?Referring Provider: Alen Bleacher, MD ?Primary Care Physician:  Alen Bleacher, MD ? ?Indication for Procedure:  Colon cancer Surveillance ? ? ?IMPRESSION:  ?Need for colon cancer surveillance ?Appropriate candidate for monitored anesthesia care ? ?PLAN: ?Colonoscopy in the [facility] today ? ? ?HPI: ACE BERGFELD is a 61 y.o. male presents for surveillance colonoscopy. ? ?Prior endoscopic history: ?Colonoscopy in 2003 at the [place] was normal. He remembers being awake. He would like to proceed with surveillance colonoscopy now.  ?  ?GI ROS is negative except for several years of intermittent rectal itching, particularly after a bowel movement. Happens intermittently. Doesn't happen frequently enough that he feels that treatment is indicated.  No blood or mucous in the stool. No rectal pain. There may be an associated rectal fullness.  ?  ?There is no known family history of colon cancer or polyps. No family history of stomach cancer or other GI malignancy. No family history of inflammatory bowel disease or celiac.  ?  ? ? ?Past Medical History:  ?Diagnosis Date  ? CAD (coronary artery disease)   ? a. 05/2013: NSTEMI s/p DES to mRCA (culprit vessel), residual moderate LAD disease (the patient fails medical therapy and this was found to be significant, this would be amenable to PCI).  ? CHF (congestive heart failure) ([place])   ? CKD (chronic kidney disease), stage II   ? DVT (deep venous thrombosis) ([place]) 1981  ? Hypercholesteremia   ? Hyperglycemia   ? Hypertension   ? ? ?Past Surgical History:  ?Procedure Laterality Date  ? CORONARY ARTERY BYPASS GRAFT N/A 08/09/2020  ? Procedure: CORONARY ARTERY BYPASS GRAFTING (CABG) X  FOUR   USING  LEFT INTERNAL MAMMARY ARTERY HARVEST AND RIGHT AND LEFT ENDOSCOPIC SAPHENOUS VEIN HARVEST;  Surgeon: [name], MD;  Location: [facility];  Service: Open Heart Surgery;  Laterality: N/A;  ? CORONARY STENT PLACEMENT  05/2013  ? mRCA  ? CORONARY/GRAFT ACUTE MI REVASCULARIZATION  2015  ?  INTRAVASCULAR PRESSURE WIRE/FFR STUDY N/A 08/02/2020  ? Procedure: INTRAVASCULAR PRESSURE WIRE/FFR STUDY;  Surgeon: [name], [name], MD;  Location: [place] [facility];  Service: Cardiovascular;  Laterality: N/A;  ? KNEE SURGERY Left 2009  ? LEFT HEART CATH AND CORONARY ANGIOGRAPHY N/A 08/02/2020  ? Procedure: LEFT HEART CATH AND CORONARY ANGIOGRAPHY;  Surgeon: [name], [name], MD;  Location: [place] [facility];  Service: Cardiovascular;  Laterality: N/A;  ? LEFT HEART CATHETERIZATION WITH CORONARY ANGIOGRAM N/A 06/02/2013  ? Procedure: LEFT HEART CATHETERIZATION WITH CORONARY ANGIOGRAM;  Surgeon: [name], MD;  Location: [facility] CATH LAB;  Service: Cardiovascular;  Laterality: N/A;  ? PERCUTANEOUS CORONARY STENT INTERVENTION (PCI-S)  06/02/2013  ? Procedure: PERCUTANEOUS CORONARY STENT INTERVENTION (PCI-S);  Surgeon: [name], MD;  Location: [facility] CATH LAB;  Service: Cardiovascular;;  ? TEE WITHOUT CARDIOVERSION N/A 08/09/2020  ? Procedure: TRANSESOPHAGEAL ECHOCARDIOGRAM (TEE);  Surgeon: [name], MD;  Location: [place];  Service: Open Heart Surgery;  Laterality: N/A;  ? ? ?Current Outpatient Medications  ?Medication Sig Dispense Refill  ? ALPRAZolam (XANAX) 0.25 MG tablet TAKE 1 TABLET(0.25 MG) BY MOUTH TWICE DAILY AS NEEDED FOR ANXIETY 60 tablet 0  ? aspirin 81 MG EC tablet Take 1 tablet (81 mg total) by mouth daily. 30 tablet   ? atorvastatin (LIPITOR) 80 MG tablet Take 1 tablet (80 mg total) by mouth every evening. 90 tablet 2  ? carvedilol (COREG) 3.125 MG tablet Take 1 tablet (3.125 mg total) by mouth 2 (  two) times daily. 180 tablet 2  ? clopidogrel (PLAVIX) 75 MG tablet Take 1 tablet (75 mg total) by mouth daily. 90 tablet 2  ? furosemide (LASIX) 40 MG tablet Take 1 tablet (40 mg total) by mouth daily. 90 tablet 2  ? nitroGLYCERIN (NITROSTAT) 0.4 MG SL tablet Place 1 tablet (0.4 mg total) under the tongue every 5 (five) minutes as needed for chest pain (up to 3 doses). 25 tablet 3  ?  spironolactone (ALDACTONE) 25 MG tablet Take 0.5 tablets (12.5 mg total) by mouth daily. 45 tablet 3  ? traMADol (ULTRAM) 50 MG tablet Take 1 tablet (50 mg total) by mouth every 4 (four) hours as needed for moderate pain. 30 tablet 0  ? ?No current facility-administered medications for this visit.  ? ? ?Allergies as of 09/26/2021 - Review Complete 08/14/2021  ?Allergen Reaction Noted  ? Penicillins  08/02/2020  ? ? ?Family History  ?Problem Relation Age of Onset  ? Diabetes Mother   ? Heart failure Father   ? Heart attack Father   ? Bone cancer Father   ? Heart disease Brother   ?     cabg  ? Heart attack Brother   ? CAD Other   ?     Multiple family members with CABG  ? Colon cancer Neg Hx   ? Pancreatic cancer Neg Hx   ? Liver disease Neg Hx   ? Esophageal cancer Neg Hx   ? ? ? ?Physical Exam: ?General:   Alert,  well-nourished, pleasant and cooperative in NAD ?Head:  Normocephalic and atraumatic. ?Eyes:  Sclera clear, no icterus.   Conjunctiva pink. ?Mouth:  No deformity or lesions.   ?Neck:  Supple; no masses or thyromegaly. ?Lungs:  Clear throughout to auscultation.   No wheezes. ?Heart:  Regular rate and rhythm; no murmurs. ?Abdomen:  Soft, non-tender, nondistended, normal bowel sounds, no rebound or guarding.  ?Msk:  Symmetrical. No boney deformities ?LAD: No inguinal or umbilical LAD ?Extremities:  No clubbing or edema. ?Neurologic:  Alert and  oriented x4;  grossly nonfocal ?Skin:  No obvious rash or bruise. ?Psych:  Alert and cooperative. Normal mood and affect. ? ? ? ? ?Studies/Results: ?No results found. ? ? ? ?Renae Mottley L. Tarri Glenn, MD, MPH ?09/26/2021, 9:27 AM ? ? ?  ?

## 2021-09-26 NOTE — Progress Notes (Signed)
Went over with pt and his daughter, pt has a sore on the right side of his nose from his glasses.  I recommended pt take his glasses and get them adjusted.  Small amount of redness on the left side of his nose also.   ? ? ?Pt c/o abdominal cramping in the recovery room.  I explained to pt and his daughter this is normal.  We have to inflate the colon with air to do the colonoscopy.  After encouraging pt to bear down and pass flatus, he did so.   ? ?No problems noted in the recovery room. maw  ?

## 2021-09-26 NOTE — Op Note (Signed)
[facility] ?Patient Name: Lonnie Skinner ?Procedure Date: 09/26/2021 10:40 AM ?MRN: [id number] ?Endoscopist: Thornton Park MD, MD ?Age: 61 ?Referring MD:  ?Date of Birth: 1960-07-21 ?Gender: Male ?Account #: [id number] ?Procedure:                Colonoscopy ?Indications:              Screening for colorectal malignant neoplasm ?                          No known family history of colon cancer or polyps ?Medicines:                Monitored Anesthesia Care ?Procedure:                Pre-Anesthesia Assessment: ?                          - Prior to the procedure, a History and Physical  ?                          was performed, and patient medications and  ?                          allergies were reviewed. The patient's tolerance of  ?                          previous anesthesia was also reviewed. The risks  ?                          and benefits of the procedure and the sedation  ?                          options and risks were discussed with the patient.  ?                          All questions were answered, and informed consent  ?                          was obtained. Prior Anticoagulants: The patient has  ?                          taken Plavix (clopidogrel), last dose was 5 days  ?                          prior to procedure. ASA Grade Assessment: III - A  ?                          patient with severe systemic disease. After  ?                          reviewing the risks and benefits, the patient was  ?                          deemed in satisfactory condition to undergo the  ?  procedure. ?                          After obtaining informed consent, the colonoscope  ?                          was passed under direct vision. Throughout the  ?                          procedure, the patient's blood pressure, pulse, and  ?                          oxygen saturations were monitored continuously. The  ?                          Olympus CF-[id number] (#[id number]) Colonoscope was  ?                           introduced through the anus and advanced to the 3  ?                          cm into the ileum. A second forward view of the  ?                          right colon was performed. The colonoscopy was  ?                          performed without difficulty. The patient tolerated  ?                          the procedure well. The quality of the bowel  ?                          preparation was good. The terminal ileum, ileocecal  ?                          valve, appendiceal orifice, and rectum were  ?                          photographed. ?Scope In: 10:56:27 AM ?Scope Out: 11:11:35 AM ?Scope Withdrawal Time: 0 hours 13 minutes 44 seconds  ?Total Procedure Duration: 0 hours 15 minutes 8 seconds  ?Findings:                 Hemorrhoids were found on perianal exam. ?                          Three sessile polyps were found in the descending  ?                          colon and transverse colon. The polyps were 2 to 3  ?                          mm in size. These polyps were removed with a cold  ?  snare. Resection and retrieval were complete.  ?                          Estimated blood loss was minimal. ?                          The exam was otherwise without abnormality on  ?                          direct and retroflexion views. ?Complications:            No immediate complications. ?Estimated Blood Loss:     Estimated blood loss was minimal. ?Impression:               - Hemorrhoids found on perianal exam. ?                          - Three 2 to 3 mm polyps in the descending colon  ?                          and in the transverse colon, removed with a cold  ?                          snare. Resected and retrieved. ?                          - The examination was otherwise normal on direct  ?                          and retroflexion views. ?Recommendation:           - Patient has a contact number available for  ?                          emergencies. The signs and  symptoms of potential  ?                          delayed complications were discussed with the  ?                          patient. Return to normal activities tomorrow.  ?                          Written discharge instructions were provided to the  ?                          patient. ?                          - Resume previous diet. ?                          - Continue present medications. ?                          - Resume Plavix (clopidogrel) at prior dose 09/28/21. ?                          -  Await pathology results. ?                          - Repeat colonoscopy date to be determined after  ?                          pending pathology results are reviewed for  ?                          surveillance. ?                          - Emerging evidence supports eating a diet of  ?                          fruits, vegetables, grains, calcium, and yogurt  ?                          while reducing red meat and alcohol may reduce the  ?                          risk of colon cancer. ?                          - Thank you for allowing me to be involved in your  ?                          colon cancer prevention. ?Thornton Park MD, MD ?09/26/2021 11:16:38 AM ?This report has been signed electronically. ?

## 2021-09-26 NOTE — Patient Instructions (Signed)
Handouts were given to your care partner on polyps and hemorrhoids. ?Resume your PLAVIX at prior dose 09/28/21. ?You may resume your other current medications today. ?Await biopsy results.  May take 1-3 weeks to receive pathology results. ?Please call if any questions or concerns. ? ? ? ?YOU HAD AN ENDOSCOPIC PROCEDURE TODAY AT [facility]:   Refer to the procedure report that was given to you for any specific questions about what was found during the examination.  If the procedure report does not answer your questions, please call your gastroenterologist to clarify.  If you requested that your care partner not be given the details of your procedure findings, then the procedure report has been included in a sealed envelope for you to review at your convenience later. ? ?YOU SHOULD EXPECT: Some feelings of bloating in the abdomen. Passage of more gas than usual.  Walking can help get rid of the air that was put into your GI tract during the procedure and reduce the bloating. If you had a lower endoscopy (such as a colonoscopy or flexible sigmoidoscopy) you may notice spotting of blood in your stool or on the toilet paper. If you underwent a bowel prep for your procedure, you may not have a normal bowel movement for a few days. ? ?Please Note:  You might notice some irritation and congestion in your nose or some drainage.  This is from the oxygen used during your procedure.  There is no need for concern and it should clear up in a day or so. ? ?SYMPTOMS TO REPORT IMMEDIATELY: ? ?Following lower endoscopy (colonoscopy or flexible sigmoidoscopy): ? Excessive amounts of blood in the stool ? Significant tenderness or worsening of abdominal pains ? Swelling of the abdomen that is new, acute ? Fever of 100?F or higher ? ? ?For urgent or emergent issues, a gastroenterologist can be reached at any hour by calling [phone]. ?Do not use MyChart messaging for urgent concerns.  ? ? ?DIET:  We do recommend a  small meal at first, but then you may proceed to your regular diet.  Drink plenty of fluids but you should avoid alcoholic beverages for 24 hours. ? ?ACTIVITY:  You should plan to take it easy for the rest of today and you should NOT DRIVE or use heavy machinery until tomorrow (because of the sedation medicines used during the test).   ? ?FOLLOW UP: ?Our staff will call the number listed on your records 48-72 hours following your procedure to check on you and address any questions or concerns that you may have regarding the information given to you following your procedure. If we do not reach you, we will leave a message.  We will attempt to reach you two times.  During this call, we will ask if you have developed any symptoms of COVID 19. If you develop any symptoms (ie: fever, flu-like symptoms, shortness of breath, cough etc.) before then, please call [phone].  If you test positive for Covid 19 in the 2 weeks post procedure, please call and report this information to [place].   ? ?If any biopsies were taken you will be contacted by phone or by letter within the next 1-3 weeks.  Please call us at [phone] if you have not heard about the biopsies in 3 weeks.  ? ? ?SIGNATURES/CONFIDENTIALITY: ?You and/or your care partner have signed paperwork which will be entered into your electronic medical record.  These signatures attest to the fact that that the  information above on your After Visit Summary has been reviewed and is understood.  Full responsibility of the confidentiality of this discharge information lies with you and/or your care-partner.  ? ?  ? ? ? ? ?

## 2021-09-28 ENCOUNTER — Telehealth: Payer: Self-pay | Admitting: *Deleted

## 2021-09-28 NOTE — Telephone Encounter (Signed)
?  Follow up Call- ? ? ?  09/26/2021  ? 10:05 AM  ?Call back number  ?Post procedure Call Back phone  # [phone]  ?Permission to leave phone message Yes  ?  ? ?Patient questions: ? ?Do you have a fever, pain , or abdominal swelling? No. ?Pain Score  0 * ? ?Have you tolerated food without any problems? Yes.   ? ?Have you been able to return to your normal activities? Yes.   ? ?Do you have any questions about your discharge instructions: ?Diet   No. ?Medications  No. ?Follow up visit  No. ? ?Do you have questions or concerns about your Care? No. ? ?Actions: ?* If pain score is 4 or above: ?No action needed, pain <4. ? ? ?

## 2021-09-29 ENCOUNTER — Encounter: Payer: Self-pay | Admitting: Gastroenterology

## 2021-10-24 ENCOUNTER — Encounter: Payer: Self-pay | Admitting: *Deleted

## 2021-10-30 ENCOUNTER — Ambulatory Visit (INDEPENDENT_AMBULATORY_CARE_PROVIDER_SITE_OTHER): Payer: Medicare Other | Admitting: Family Medicine

## 2021-10-30 ENCOUNTER — Encounter: Payer: Self-pay | Admitting: Family Medicine

## 2021-10-30 VITALS — Ht 68.0 in | Wt 217.0 lb

## 2021-10-30 DIAGNOSIS — R0789 Other chest pain: Secondary | ICD-10-CM | POA: Diagnosis not present

## 2021-10-30 DIAGNOSIS — R42 Dizziness and giddiness: Secondary | ICD-10-CM | POA: Diagnosis not present

## 2021-10-30 NOTE — Progress Notes (Signed)
SUBJECTIVE:   CHIEF COMPLAINT / HPI: dizzy spells   Patient presents with complaint of dizzy spells that started Sunday while standing at church. He reports that he felt dizzy while standing on the concrete floor there. He states that he he felt both that the room was spinning and that he felt off balance. He denies feeling nauseated with this particular episode. He denies any recent medication changes. He denies recreational drug use. He denies syncope or hx of seizures. He is not sure if the symptoms resolved but he seemed to feel better after relaxing at home in his recliner and drinking water. He adds that he had symptoms of dizziness again while standing at a family gathering.    Event after end of visit:  The report worsening dizziness while seated when this provider entered the room to hand him the AVS.  He also reports discomfort in his chest that feels like a "tightness".  Patient rechecked at that time.  EKG was ordered.  Patient with history of NSTEMI. Patient denies feelings of heartburn.   PERTINENT  PMH / PSH:  HTN  Hx of NSTEMi CAD, s/p CABG     OBJECTIVE:   Ht '5\' 8"'$  (1.727 m)   Wt 217 lb (98.4 kg)   BMI 32.99 kg/m   Physical Exam Constitutional:      General: He is not in acute distress.    Appearance: Normal appearance. He is not ill-appearing or diaphoretic.  Eyes:     Extraocular Movements: Extraocular movements intact.     Conjunctiva/sclera: Conjunctivae normal.  Cardiovascular:     Rate and Rhythm: Normal rate and regular rhythm.     Pulses: Normal pulses.     Heart sounds:     No friction rub.  Pulmonary:     Effort: Pulmonary effort is normal.     Breath sounds: No wheezing, rhonchi or rales.  Abdominal:     Palpations: Abdomen is soft.     Tenderness: There is no abdominal tenderness.  Skin:    Capillary Refill: Capillary refill takes less than 2 seconds.  Neurological:     Mental Status: He is alert and oriented to person, place, and time.      Cranial Nerves: Cranial nerves 2-12 are intact. No dysarthria.     Sensory: Sensation is intact.     Motor: Motor function is intact. No weakness, tremor, atrophy, abnormal muscle tone or seizure activity.     Coordination: Coordination is intact. Finger-Nose-Finger Test and Heel to [facility] Test normal.     Gait: Gait abnormal.     Comments: Normal HINTS exam   No nystagmus observed with dix hallpike maneuver   Patient appears unsteady with initially standing to sit on exam table After 2 minutes, he was able to ambulate and sit on the exam table      ASSESSMENT/PLAN:   Chest discomfort Patient reported chest discomfort and worsened dizziness  VS were within normal limits at this time  EKG was completed and did not show new signs of ischemia or injury  Recommended that patient have family member pick him up for transportation home given worsening dizziness and concern for unsteadiness with ambulation   Dizziness of unknown cause CBC and BMP collected today as anemia and electrolyte abnormalities considered as potential cause for feeling of dizziness Given the unsteadiness on his feet with standing recommended that he have transportation home provided by a family member. No other signs of neurologic abnormalities to increase suspicion  for intracranial etiology during today's exam. Patient did not display PE signs of dehydration  Considered orthostatic changes related to dizzy episodes or vasovagal etiology given these occurred after prolonged periods of standing in warm environments  Patient noted to have elevated BP in 672W systolic, hesitant to adjust medications given dizziness and concern for making patient's dizziness worse with risk of developing hypotension  Will have patient follow up in 4 days to assess for further episodes of dizziness    Scheduled 11/03/21 for follow up   Eulis Foster, MD [place]

## 2021-10-30 NOTE — Patient Instructions (Signed)
We will check some blood work to try and evaluate for causes that may be contributing to your dizziness.   I recommend drinking 6-8 bottles of water over the next few days to see if you may be experiencing symptoms related to dehydration.    If you begin to have weakness on one side or loss of consciousness, I recommend that you be evaluated in the ED.   Please follow up with our clinic in one week to check your symptoms.

## 2021-10-31 ENCOUNTER — Telehealth: Payer: Self-pay | Admitting: Cardiology

## 2021-10-31 LAB — CBC WITH DIFFERENTIAL/PLATELET
Basophils Absolute: 0.1 10*3/uL (ref 0.0–0.2)
Basos: 1 %
EOS (ABSOLUTE): 0.1 10*3/uL (ref 0.0–0.4)
Eos: 2 %
Hematocrit: 47.5 % (ref 37.5–51.0)
Hemoglobin: 15.9 g/dL (ref 13.0–17.7)
Immature Grans (Abs): 0 10*3/uL (ref 0.0–0.1)
Immature Granulocytes: 0 %
Lymphocytes Absolute: 1.9 10*3/uL (ref 0.7–3.1)
Lymphs: 28 %
MCH: 31.1 pg (ref 26.6–33.0)
MCHC: 33.5 g/dL (ref 31.5–35.7)
MCV: 93 fL (ref 79–97)
Monocytes Absolute: 0.5 10*3/uL (ref 0.1–0.9)
Monocytes: 8 %
Neutrophils Absolute: 4.2 10*3/uL (ref 1.4–7.0)
Neutrophils: 61 %
Platelets: 229 10*3/uL (ref 150–450)
RBC: 5.12 x10E6/uL (ref 4.14–5.80)
RDW: 13.1 % (ref 11.6–15.4)
WBC: 6.8 10*3/uL (ref 3.4–10.8)

## 2021-10-31 LAB — BASIC METABOLIC PANEL
BUN/Creatinine Ratio: 12 (ref 10–24)
BUN: 15 mg/dL (ref 8–27)
CO2: 21 mmol/L (ref 20–29)
Calcium: 9.4 mg/dL (ref 8.6–10.2)
Chloride: 102 mmol/L (ref 96–106)
Creatinine, Ser: 1.21 mg/dL (ref 0.76–1.27)
Glucose: 94 mg/dL (ref 70–99)
Potassium: 4.6 mmol/L (ref 3.5–5.2)
Sodium: 137 mmol/L (ref 134–144)
eGFR: 68 mL/min/{1.73_m2} (ref >59)

## 2021-10-31 NOTE — Telephone Encounter (Signed)
Pt was calling to inform Dr. [name] that he went in to see his PCP yesterday (note visible in epic) for complaints of vertigo, and they advised him he may be anemic.  Pt had labs done and CBC appears to be within normal limits (PCP has not notified or resulted yet).   Pt states his pressure was up at that visit.  He states dizziness and complaints of vertigo began last Thursday "at thirsty Thursday at the [employer]."  He states since the game he has intermittent episodes of feeling dizzy, especially with position changes.  He states this makes him nauseated at times.   Pt states he will go back into see his PCP this Friday for follow-up of lab results. He denies any other cardiac complaints like chest pain, sob, doe, orthopnea, pre-syncopal or syncopal episodes.   When looking at his PCP note did notate he was hypertensive at that visit.  PCP advised him yesterday to increase his po water intake, have labs done, and follow-up with them on Friday 6/16.  He did have an EKG at his PCP visit yesterday as well.  Scheduled the pt to come into the office to see Richardson Dopp PA-C for next Monday 6/19 at 2:45 pm.  Pt didn't want an appt until then, for he wants to see his PCP on Friday first.   Advised the pt to limit his salt and alcohol intake.  Advised him of ortho stasis references. Advised him to continue his current meds.  Advised him to increase his po water intake.  Advised him to change positions slowly.  Advised him to follow-up with Richardson Dopp PA-C as planned for Monday 6/19.  ED precautions provided to the pt if symptoms were to worsen or persist in the meantime between now and his appt with our office on Monday.  Informed him that I will route this message to Dr. [name] to make her aware of this plan. Pt verbailzed understanding and agrees with this plan.  Off note: Time spent with pt on the phone was 20 mins.

## 2021-10-31 NOTE — Telephone Encounter (Signed)
STAT if patient feels like he/she is going to faint   Are you dizzy now? No   Do you feel faint or have you passed out? No   Do you have any other symptoms? CP   Have you checked your HR and BP (record if available)?  171/97 163/104, & 163/110    Pt c/o of Chest Pain: STAT if CP now or developed within 24 hours  1. Are you having CP right now? No   2. Are you experiencing any other symptoms (ex. SOB, nausea, vomiting, sweating)? Dizziness like vertigo   3. How long have you been experiencing CP? Started yesterday at the doctors office  4. Is your CP continuous or coming and going? Doesn't know   5. Have you taken Nitroglycerin? No    Transferred to triage due to being STAT.  ?

## 2021-11-01 NOTE — Telephone Encounter (Signed)
Thank you for getting him an appointment.

## 2021-11-03 ENCOUNTER — Other Ambulatory Visit: Payer: Self-pay

## 2021-11-03 ENCOUNTER — Encounter: Payer: Self-pay | Admitting: Family Medicine

## 2021-11-03 ENCOUNTER — Ambulatory Visit (INDEPENDENT_AMBULATORY_CARE_PROVIDER_SITE_OTHER): Payer: Medicare Other | Admitting: Family Medicine

## 2021-11-03 VITALS — BP 143/86 | HR 63 | Wt 216.8 lb

## 2021-11-03 DIAGNOSIS — R42 Dizziness and giddiness: Secondary | ICD-10-CM

## 2021-11-03 DIAGNOSIS — R270 Ataxia, unspecified: Secondary | ICD-10-CM | POA: Diagnosis not present

## 2021-11-03 DIAGNOSIS — R0789 Other chest pain: Secondary | ICD-10-CM | POA: Insufficient documentation

## 2021-11-03 NOTE — Assessment & Plan Note (Addendum)
Patient with ataxia and unable to perform exam maneuvers related to cerebellar function We will recommend for brain MRI to assess for any structural abnormalities Patient was agreeable with this plan

## 2021-11-03 NOTE — Assessment & Plan Note (Signed)
Patient reported chest discomfort and worsened dizziness  VS were within normal limits at this time  EKG was completed and did not show new signs of ischemia or injury  Recommended that patient have family member pick him up for transportation home given worsening dizziness and concern for unsteadiness with ambulation

## 2021-11-03 NOTE — Assessment & Plan Note (Signed)
CBC and BMP collected today as anemia and electrolyte abnormalities considered as potential cause for feeling of dizziness Given the unsteadiness on his feet with standing recommended that he have transportation home provided by a family member. No other signs of neurologic abnormalities to increase suspicion for intracranial etiology during today's exam. Patient did not display PE signs of dehydration  Considered orthostatic changes related to dizzy episodes or vasovagal etiology given these occurred after prolonged periods of standing in warm environments  Patient noted to have elevated BP in 407W systolic, hesitant to adjust medications given dizziness and concern for making patient's dizziness worse with risk of developing hypotension  Will have patient follow up in 4 days to assess for further episodes of dizziness

## 2021-11-03 NOTE — Patient Instructions (Signed)
We will order a MRI of your brain to assess for causes of dizziness and your inability to stand normally.  Our office will call to schedule this appointment with you once our referral/imaging coordinator returns.  Over the weekend, please be evaluated in the ED if you have worsening dizziness or experienced chest pain or difficulty breathing or if you begin to have weakness or numbness on one side of your body.  I recommend that she continue your blood pressure medications as prescribed.  Your blood pressure was improved after recheck today.  Your blood work from our visit on 6/12 was all within normal limits.  We will follow-up with results of your imaging study once they are available.

## 2021-11-03 NOTE — Progress Notes (Cosign Needed Addendum)
    SUBJECTIVE:   CHIEF COMPLAINT / HPI: Follow-up for dizziness  Patient presents for dizziness follow-up.  He had normal CBC and basic metabolic panel at previous evaluation four days ago.  He reports that he mostly was sitting while at home so he did not feel dizzy since last visit  He denies any further episodes since evaluation four days ago  Three days ago he discussed symptoms with cardiology office and is scheduled for follow up on 11/06/21  Patient states he does not check BP at home  He states he has been trying to drink water and estimates drinking between 1-3 16oz bottles per day  He denies HA currently or during the last few days  He reports that he needed to hold onto the wall while walking into the waiting room before this appt today  This was the first time since Monday that he felt unsteady on his feet   He denies vision disturbance, he reports having an upcoming appt in July for his eye exam  He denies feeling off balance while ambulating in his home   He states that he has to take a few minutes to get focused when first awakening in the AM but is able to get up from lying down and move normally   He denies the chest discomfort that he felt Monday  He denies numbness or tingling in his extremities   Today, he denies feeling the the room is spinning when he has the dizzy sensation   PERTINENT  PMH / PSH:  HTN  NSTEMI  CAD S/p CABG   OBJECTIVE:   BP (!) 143/86   Pulse 63   Wt 216 lb 12.8 oz (98.3 kg)   SpO2 98%   BMI 32.96 kg/m   Physical Exam Constitutional:      General: He is not in acute distress.    Appearance: Normal appearance. He is not ill-appearing or diaphoretic.  HENT:     Mouth/Throat:     Mouth: Mucous membranes are moist.     Pharynx: No oropharyngeal exudate or posterior oropharyngeal erythema.  Cardiovascular:     Rate and Rhythm: Normal rate and regular rhythm.     Heart sounds: Normal heart sounds. No murmur heard.    No friction  rub.  Pulmonary:     Effort: Pulmonary effort is normal.     Breath sounds: No wheezing or rales.  Neurological:     Mental Status: He is alert and oriented to person, place, and time.     Motor: No weakness.     Comments: Patient appears to be more steady on his feet from prior exam 4 days ago  He is unable to stand with feet together with eyes open  Immediately becomes unsteady while standing with eyes closed and arms down by his sides   Unable to complete pronator drift testing due to unsteadiness with eyes closed   Normal finger to nose and heel to shin testing   When asked to stand during exam, he reports feeling unsteady but is able to move from chair to exam table and back to the chair all while pivoting without stumbling or falling      ASSESSMENT/PLAN:   Ataxia Patient with ataxia and unable to perform exam maneuvers related to cerebellar function We will recommend for brain MRI to assess for any structural abnormalities Patient was agreeable with this plan     Eulis Foster, MD [place]

## 2021-11-06 ENCOUNTER — Encounter: Payer: Self-pay | Admitting: *Deleted

## 2021-11-06 ENCOUNTER — Ambulatory Visit (INDEPENDENT_AMBULATORY_CARE_PROVIDER_SITE_OTHER): Payer: Medicare Other

## 2021-11-06 ENCOUNTER — Ambulatory Visit (INDEPENDENT_AMBULATORY_CARE_PROVIDER_SITE_OTHER): Payer: Medicare Other | Admitting: Physician Assistant

## 2021-11-06 ENCOUNTER — Encounter: Payer: Self-pay | Admitting: Physician Assistant

## 2021-11-06 VITALS — BP 130/80 | HR 88 | Ht 68.0 in | Wt 216.0 lb

## 2021-11-06 DIAGNOSIS — I200 Unstable angina: Secondary | ICD-10-CM | POA: Diagnosis not present

## 2021-11-06 DIAGNOSIS — R55 Syncope and collapse: Secondary | ICD-10-CM

## 2021-11-06 DIAGNOSIS — I10 Essential (primary) hypertension: Secondary | ICD-10-CM

## 2021-11-06 DIAGNOSIS — R072 Precordial pain: Secondary | ICD-10-CM

## 2021-11-06 DIAGNOSIS — E785 Hyperlipidemia, unspecified: Secondary | ICD-10-CM

## 2021-11-06 DIAGNOSIS — N182 Chronic kidney disease, stage 2 (mild): Secondary | ICD-10-CM

## 2021-11-06 DIAGNOSIS — I771 Stricture of artery: Secondary | ICD-10-CM

## 2021-11-06 DIAGNOSIS — I2510 Atherosclerotic heart disease of native coronary artery without angina pectoris: Secondary | ICD-10-CM

## 2021-11-06 DIAGNOSIS — R42 Dizziness and giddiness: Secondary | ICD-10-CM | POA: Diagnosis not present

## 2021-11-06 MED ORDER — CARVEDILOL 6.25 MG PO TABS: 6.2500 mg | ORAL_TABLET | Freq: Two times a day (BID) | ORAL | 3 refills | Status: DC

## 2021-11-06 NOTE — Patient Instructions (Signed)
Medication Instructions:  Increase Coreg to 6.25 mg twice a day *If you need a refill on your cardiac medications before your next appointment, please call your pharmacy*   Lab Work: None If you have labs (blood work) drawn today and your tests are completely normal, you will receive your results only by: [place] (if you have MyChart) OR A paper copy in the mail If you have any lab test that is abnormal or we need to change your treatment, we will call you to review the results.   Testing/Procedures: Your physician has requested that you have a lexiscan myoview. For further information please visit [email or web address]. Please follow instruction sheet, as given.   Your physician has requested that you have a carotid duplex. This test is an ultrasound of the carotid arteries in your neck. It looks at blood flow through these arteries that supply the brain with blood. Allow one hour for this exam. There are no restrictions or special instructions.   ZIO XT- Long Term Monitor Instructions  Your physician has requested you wear a ZIO patch monitor for 14 days.  This is a single patch monitor. [employer] supplies one patch monitor per enrollment. Additional stickers are not available. Please do not apply patch if you will be having a Nuclear Stress Test,  Echocardiogram, Cardiac CT, MRI, or Chest Xray during the period you would be wearing the  monitor. The patch cannot be worn during these tests. You cannot remove and re-apply the  ZIO XT patch monitor.  Your ZIO patch monitor will be mailed 3 day USPS to your address on file. It may take 3-5 days  to receive your monitor after you have been enrolled.  Once you have received your monitor, please review the enclosed instructions. Your monitor  has already been registered assigning a specific monitor serial # to you.  Billing and Patient Assistance Program Information  We have supplied [employer] with any of your insurance information on  file for billing purposes. [employer] offers a sliding scale Patient Assistance Program for patients that do not have  insurance, or whose insurance does not completely cover the cost of the ZIO monitor.  You must apply for the Patient Assistance Program to qualify for this discounted rate.  To apply, please call [employer] at [phone], select option 4, select option 2, ask to apply for  Patient Assistance Program. Theodore Demark will ask your household income, and how many people  are in your household. They will quote your out-of-pocket cost based on that information.  [employer] will also be able to set up a 91-month interest-free payment plan if needed.  Applying the monitor   Shave hair from upper left chest.  Hold abrader disc by orange tab. Rub abrader in 40 strokes over the upper left chest as  indicated in your monitor instructions.  Clean area with 4 enclosed alcohol pads. Let dry.  Apply patch as indicated in monitor instructions. Patch will be placed under collarbone on left  side of chest with arrow pointing upward.  Rub patch adhesive wings for 2 minutes. Remove white label marked "1". Remove the white  label marked "2". Rub patch adhesive wings for 2 additional minutes.  While looking in a mirror, press and release button in center of patch. A small green light will  flash 3-4 times. This will be your only indicator that the monitor has been turned on.  Do not shower for the first 24 hours. You may shower after the first 24 hours.  Press the button if you feel a symptom. You will hear a small click. Record Date, Time and  Symptom in the Patient Logbook.  When you are ready to remove the patch, follow instructions on the last 2 pages of Patient  Logbook. Stick patch monitor onto the last page of Patient Logbook.  Place Patient Logbook in the blue and white box. Use locking tab on box and tape box closed  securely. The blue and white box has prepaid postage on it. Please place it in the  mailbox as  soon as possible. Your physician should have your test results approximately 7 days after the  monitor has been mailed back to [facility].  Call [place] at [phone] if you have questions regarding  your ZIO XT patch monitor. Call them immediately if you see an orange light blinking on your  monitor.  If your monitor falls off in less than 4 days, contact our Monitor department at [phone].  If your monitor becomes loose or falls off after 4 days call [employer] at [phone] for  suggestions on securing your monitor    Follow-Up: At [facility], you and your health needs are our priority.  As part of our continuing mission to provide you with exceptional heart care, we have created designated Provider Care Teams.  These Care Teams include your primary Cardiologist (physician) and Advanced Practice Providers (APPs -  Physician Assistants and Nurse Practitioners) who all work together to provide you with the care you need, when you need it.  Your next appointment:   4 week(s)  The format for your next appointment:   In Person  Provider:   Freada Bergeron, MD {   Other Instructions Keep a log of your blood pressure but be sure that you are checking it in your right arm.  Important Information About Sugar

## 2021-11-06 NOTE — Assessment & Plan Note (Signed)
History of non-STEMI in 2015 treated with DES to the RCA.  He underwent CABG in March 2022.  He has had an episode of chest discomfort recently.  Given his recent symptoms of dizziness, I have recommended proceeding with stress testing to rule out ischemia as well as to reassess his ejection fraction.  If his EF is down on Myoview, he will need an echocardiogram.  Continue aspirin 81 mg daily, Plavix 85 mg daily, atorvastatin 80 mg daily, carvedilol  Lexiscan Myoview  Follow-up 4 weeks with Dr. [name]

## 2021-11-06 NOTE — Assessment & Plan Note (Signed)
Etiology not entirely clear.  His symptoms are somewhat difficult to describe.  Question if he has had near syncope.  He does not have any significant arrhythmias on electrocardiogram today.  He does have a blood pressure discrepancy.  His blood pressure on the right is much higher than the left.  I do not hear any supraclavicular bruits.  He did have normal Dopplers last year prior to his bypass.  Brain MRI is pending per primary care.  He may be feeling dizzy secondary to uncontrolled blood pressure.  Question if he may have subclavian stenosis.  14-day ZIO AT monitor  Carotid US to rule out subclavian stenosis  Continue with brain MRI as planned by primary care  Follow-up with Dr. [name] after testing in 4 weeks

## 2021-11-06 NOTE — Assessment & Plan Note (Signed)
" >>  ASSESSMENT AND PLAN FOR ESSENTIAL HYPERTENSION, BENIGN WRITTEN ON 11/06/2021  3:33 PM BY Tyrie Porzio T, PA-C  Blood pressure is higher on the right than the left.  Obtain carotid US  as noted.  I suspect he may be experiencing some symptoms of dizziness from uncontrolled hypertension.  Increase carvedilol  to 6.25 mg twice daily.  Continue spironolactone  12.5 mg daily.  Recent creatinine, potassium normal.  We will provide him with a blood pressure machine (if we have 1) and ask him to track blood pressures until his follow-up in 4 weeks. "

## 2021-11-06 NOTE — Assessment & Plan Note (Signed)
LDL optimal in November 2022.  Continue atorvastatin milligrams daily.

## 2021-11-06 NOTE — Assessment & Plan Note (Signed)
Recent creatinine normal. 

## 2021-11-06 NOTE — Assessment & Plan Note (Signed)
Blood pressure is higher on the right than the left.  Obtain carotid US as noted.  I suspect he may be experiencing some symptoms of dizziness from uncontrolled hypertension.  Increase carvedilol to 6.25 mg twice daily.  Continue spironolactone 12.5 mg daily.  Recent creatinine, potassium normal.  We will provide him with a blood pressure machine (if we have 1) and ask him to track blood pressures until his follow-up in 4 weeks.

## 2021-11-06 NOTE — Progress Notes (Unsigned)
Enrolled for [employer] to mail a ZIO XT long term holter monitor to the patients address on file.  ? ?Dr. Pemberton to read. ?

## 2021-11-06 NOTE — Progress Notes (Signed)
Cardiology Office Note:    Date:  11/06/2021   ID:  Lonnie Skinner, DOB 05-Jul-1960, MRN [id number]  PCP:  Alen Bleacher, MD  [facility] Providers Cardiologist:  Freada Bergeron, MD     Referring MD: Alen Bleacher, MD   Chief Complaint:  Dizziness and Chest Pain    Patient Profile: Coronary artery disease  NSTEMI in 2015 s/p DES to mRCA  S/p CABG 07/2020 (L-LAD, S-PDA, S-OM1, S-OM2) Post op AFib (HFpEF) heart failure with preserved ejection fraction  Hypertension  Hyperlipidemia  Chronic kidney disease  Hx of DVT in 1981  Venous insufficiency   Prior CV Studies: LEFT HEART CATH 08/02/2020 1. 3 vessel obstructive CAD - critical 95% proximal LAD - 70% OM1 - 60% OM2 - 70% mid RCA proximal to prior stent with abnormal RFR 2. Normal LV function 3. Mildly elevated LVEDP Plan: recommend CABG. Will hold Plavix. Admit to telemetry and treat with IV heparin. Check Echo.   Echocardiogram 08/12/20 EF 60-65, no RWMA   Pre-CABG Dopplers 08/08/20 Minimal carotid plaque, no sig stenosis   History of Present Illness:   Lonnie Skinner is a 61 y.o. male with the above problem list.  He was last seen by Dr. [name] in March 2023.  He recently was seen by primary care for dizziness.  His blood pressure has been elevated.  Notes indicate he had some chest pain as well.  He is noted to have some symptoms of ataxia and a brain MRI is pending.  Of note, an EKG was done on 10/30/2021.  However, it has not yet been scanned into his chart and is currently unavailable for review.  He returns for cardiology evaluation.  He has been having symptoms of dizziness for 1-2 mos.  It is difficult for him to describe.  He has not had a spinning sensation.  He has not had syncope.  He has lost his balance and there is a question of near syncope.  He has some days when he is not dizzy.  There is a question of whether or not he has more dizziness when he stands.  He apparently had orthostatic vital signs at  the primary care clinic last week.  I cannot find documentation of this but the patient tells me he had no significant blood pressure drop.  He had chest discomfort while he was in his PCPs office last week.  He had not had chest discomfort since his bypass surgery.   He has not had any chest discomfort since that day.  He has not had palpitations.  He has not had shortness of breath, orthopnea, leg edema.    Past Medical History:  Diagnosis Date   CAD (coronary artery disease)    a. 05/2013: NSTEMI s/p DES to mRCA (culprit vessel), residual moderate LAD disease (the patient fails medical therapy and this was found to be significant, this would be amenable to PCI).   CHF (congestive heart failure) (HCC)    CKD (chronic kidney disease), stage II    DVT (deep venous thrombosis) (HCC) 1981   Hypercholesteremia    Hyperglycemia    Hypertension    Current Medications: Current Meds  Medication Sig   ALPRAZolam (XANAX) 0.25 MG tablet TAKE 1 TABLET(0.25 MG) BY MOUTH TWICE DAILY AS NEEDED FOR ANXIETY   aspirin 81 MG EC tablet Take 1 tablet (81 mg total) by mouth daily.   carvedilol (COREG) 6.25 MG tablet Take 1 tablet (6.25 mg total) by mouth 2 (two) times  daily.   clopidogrel (PLAVIX) 75 MG tablet Take 1 tablet (75 mg total) by mouth daily.   furosemide (LASIX) 40 MG tablet Take 1 tablet (40 mg total) by mouth daily.   nitroGLYCERIN (NITROSTAT) 0.4 MG SL tablet Place 1 tablet (0.4 mg total) under the tongue every 5 (five) minutes as needed for chest pain (up to 3 doses).   spironolactone (ALDACTONE) 25 MG tablet Take 0.5 tablets (12.5 mg total) by mouth daily.   traMADol (ULTRAM) 50 MG tablet Take 1 tablet (50 mg total) by mouth every 4 (four) hours as needed for moderate pain.   [DISCONTINUED] carvedilol (COREG) 3.125 MG tablet Take 1 tablet (3.125 mg total) by mouth 2 (two) times daily.    Allergies:   Penicillins   Social History   Tobacco Use   Smoking status: Former    Types: Cigarettes     Quit date: 2007    Years since quitting: 16.4    Passive exposure: Past   Smokeless tobacco: Never  Vaping Use   Vaping Use: Never used  Substance Use Topics   Alcohol use: No   Drug use: No    Family Hx: The patient's family history includes Bone cancer in his father; CAD in an other family member; Diabetes in his mother; Heart attack in his brother and father; Heart disease in his brother; Heart failure in his father. There is no history of Colon cancer, Pancreatic cancer, Liver disease, or Esophageal cancer.  Review of Systems  Constitutional: Negative for fever.  Respiratory:  Negative for cough.   Gastrointestinal:  Negative for hematochezia.  Genitourinary:  Negative for hematuria.     EKGs/Labs/Other Test Reviewed:    EKG:  EKG is  ordered today.  The ekg ordered today demonstrates NSR, HR 70, normal axis, no ST-T wave changes, septal Q waves, QTc 4 oh  Recent Labs: 04/05/2021: ALT 14 10/30/2021: BUN 15; Creatinine, Ser 1.21; Hemoglobin 15.9; Platelets 229; Potassium 4.6; Sodium 137   Recent Lipid Panel Recent Labs    04/05/21 1701  CHOL 119  TRIG 89  HDL 49  LDLCALC 53     Risk Assessment/Calculations/Metrics:              Physical Exam:    VS:  BP 130/80 (BP Location: Left Arm)   Pulse 88   Ht '5\' 8"'$  (1.727 m)   Wt 216 lb (98 kg)   SpO2 98%   BMI 32.84 kg/m     Wt Readings from Last 3 Encounters:  11/06/21 216 lb (98 kg)  11/03/21 216 lb 12.8 oz (98.3 kg)  10/30/21 217 lb (98.4 kg)    Constitutional:      Appearance: Healthy appearance. Not in distress.  Neck:     Vascular: No carotid bruit or JVR. JVD normal.     Comments: No supraclavicular bruits Pulmonary:     Effort: Pulmonary effort is normal.     Breath sounds: No wheezing. No rales.  Cardiovascular:     Normal rate. Regular rhythm. Normal S1. Normal S2.      Murmurs: There is no murmur.  Edema:    Peripheral edema absent.  Abdominal:     Palpations: Abdomen is soft.  Skin:     General: Skin is warm and dry.  Neurological:     General: No focal deficit present.     Mental Status: Alert and oriented to person, place and time.         ASSESSMENT & PLAN:  Dizziness Etiology not entirely clear.  His symptoms are somewhat difficult to describe.  Question if he has had near syncope.  He does not have any significant arrhythmias on electrocardiogram today.  He does have a blood pressure discrepancy.  His blood pressure on the right is much higher than the left.  I do not hear any supraclavicular bruits.  He did have normal Dopplers last year prior to his bypass.  Brain MRI is pending per primary care.  He may be feeling dizzy secondary to uncontrolled blood pressure.  Question if he may have subclavian stenosis. 14-day ZIO AT monitor Carotid US to rule out subclavian stenosis Continue with brain MRI as planned by primary care Follow-up with Dr. [name] after testing in 4 weeks  CAD (coronary artery disease) History of non-STEMI in 2015 treated with DES to the RCA.  He underwent CABG in March 2022.  He has had an episode of chest discomfort recently.  Given his recent symptoms of dizziness, I have recommended proceeding with stress testing to rule out ischemia as well as to reassess his ejection fraction.  If his EF is down on Myoview, he will need an echocardiogram. Continue aspirin 81 mg daily, Plavix 85 mg daily, atorvastatin 80 mg daily, carvedilol Lexiscan Myoview Follow-up 4 weeks with Dr. [name]  Essential hypertension, benign Blood pressure is higher on the right than the left.  Obtain carotid US as noted.  I suspect he may be experiencing some symptoms of dizziness from uncontrolled hypertension.  Increase carvedilol to 6.25 mg twice daily.  Continue spironolactone 12.5 mg daily.  Recent creatinine, potassium normal.  We will provide him with a blood pressure machine (if we have 1) and ask him to track blood pressures until his follow-up in 4  weeks.  Hyperlipidemia LDL goal <70 LDL optimal in November 2022.  Continue atorvastatin milligrams daily.  CKD (chronic kidney disease) stage 2, GFR 60-89 ml/min Recent creatinine normal.         Shared Decision Making/Informed Consent The risks [chest pain, shortness of breath, cardiac arrhythmias, dizziness, blood pressure fluctuations, myocardial infarction, stroke/transient ischemic attack, nausea, vomiting, allergic reaction, radiation exposure, metallic taste sensation and life-threatening complications (estimated to be 1 in 10,000)], benefits (risk stratification, diagnosing coronary artery disease, treatment guidance) and alternatives of a nuclear stress test were discussed in detail with Mr. Dismuke and he agrees to proceed.    Dispo:  Return in about 4 weeks (around 12/04/2021) for Follow up after testing, w/ Dr. [name].   Medication Adjustments/Labs and Tests Ordered: Current medicines are reviewed at length with the patient today.  Concerns regarding medicines are outlined above.  Tests Ordered: Orders Placed This Encounter  Procedures   Cardiac Stress Test: Informed Consent Details: Physician/Practitioner Attestation; Transcribe to consent form and obtain patient signature   LONG TERM MONITOR (3-14 DAYS)   MYOCARDIAL PERFUSION IMAGING   EKG 12-Lead   VAS US CAROTID   Medication Changes: Meds ordered this encounter  Medications   carvedilol (COREG) 6.25 MG tablet    Sig: Take 1 tablet (6.25 mg total) by mouth 2 (two) times daily.    Dispense:  180 tablet    Refill:  3   Signed, Richardson Dopp, PA-C  11/06/2021 [facility] [place], [place], [place]  [address] Phone: [phone]; Fax: [phone]

## 2021-11-08 ENCOUNTER — Telehealth (HOSPITAL_COMMUNITY): Payer: Self-pay | Admitting: *Deleted

## 2021-11-08 NOTE — Telephone Encounter (Signed)
Patient given detailed instructions per Myocardial Perfusion Study Information Sheet for the test on 11/13/2021 at 10:15. Patient notified to arrive 15 minutes early and that it is imperative to arrive on time for appointment to keep from having the test rescheduled.  If you need to cancel or reschedule your appointment, please call the office within 24 hours of your appointment. . Patient verbalized understanding.Lonnie Skinner

## 2021-11-09 ENCOUNTER — Telehealth: Payer: Self-pay | Admitting: Cardiology

## 2021-11-09 ENCOUNTER — Telehealth: Payer: Self-pay | Admitting: *Deleted

## 2021-11-09 NOTE — Telephone Encounter (Signed)
Patients is calling in with questions bout the heart monitor. Please advise

## 2021-11-09 NOTE — Telephone Encounter (Signed)
Patient left a message about getting his MRI scheduled.  He hasn't heard anything about this and wanted to check on it.  MRI didn't need authorization and notification sent to team on 11-06-21.    Thanks [employer]

## 2021-11-09 NOTE — Telephone Encounter (Signed)
Spoke with patient who states he received his Zio heart monitor but will be having an MRI on 11/19/21. Patient asked if he will be able to wear his heart monitor for this procedure or if he should wait.  Advised patient to wait until he has MRI performed on 11/19/21 to apply Zio monitor for 14 days. Patient verbalized understanding.  Patient also reports his BP reading from yesterday and the day before, readings below: 11/07/21 104/67 (right arm), 138/85 (left arm) 11/08/21  98/55 (right arm), 119/76 (left arm)  Encouraged patient to continue to monitor blood pressure until his follow-up with Dr. [name].  Will forward to [employer], PA-C to review.

## 2021-11-09 NOTE — Telephone Encounter (Signed)
Agree. Continue to monitor BP. Wait until after MRI to start Monitor. Richardson Dopp, PA-C    11/09/2021 9:05 PM

## 2021-11-12 IMAGING — CT CT HEAD CODE STROKE
4 series · 17 of 47 positions shown, 19 images · non-contrast
Comparison: None.

CLINICAL DATA: Code stroke. Dysarthria. Altered mental status.
Confusion.

EXAM:
CT HEAD WITHOUT CONTRAST
TECHNIQUE: Contiguous axial images were obtained from the base of the skull
through the vertex without intravenous contrast.

[Series 3: head wo · axial · 0.43mm/px · z∈[+1090,+1210]mm · 7 of 32 slices shown, 9 images]
[im 4/32  brain]
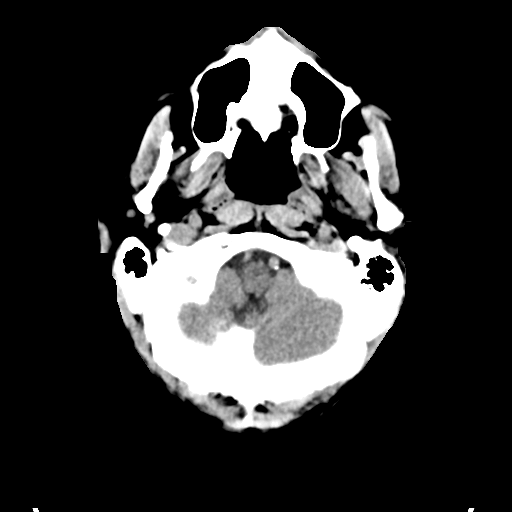
[im 4/32  bone]
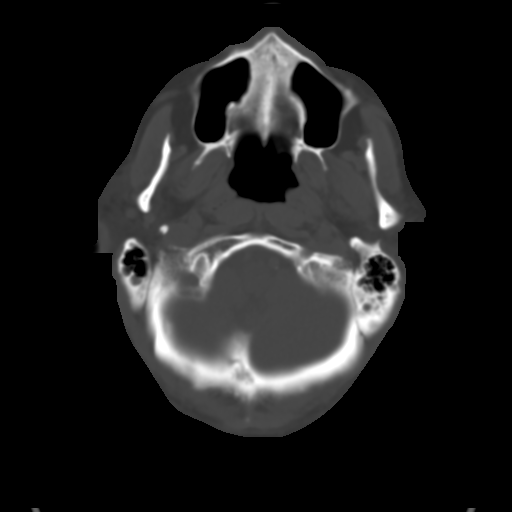
[im 8/32  brain]
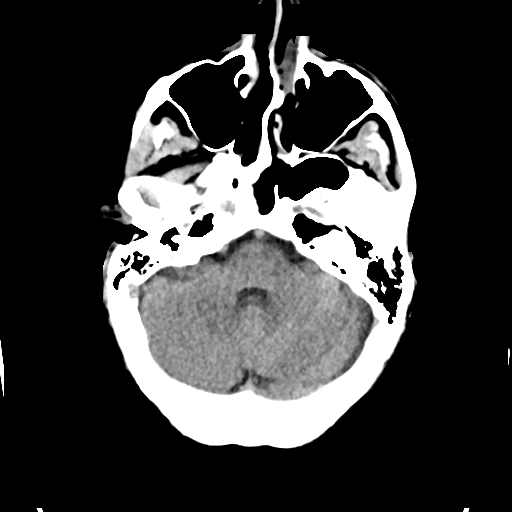
[im 12/32  brain]
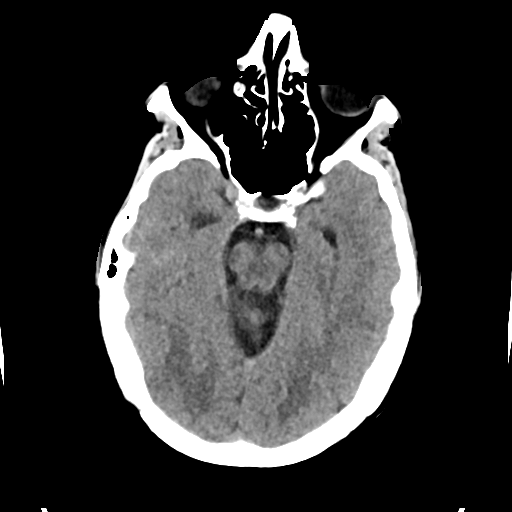
[im 16/32  brain]
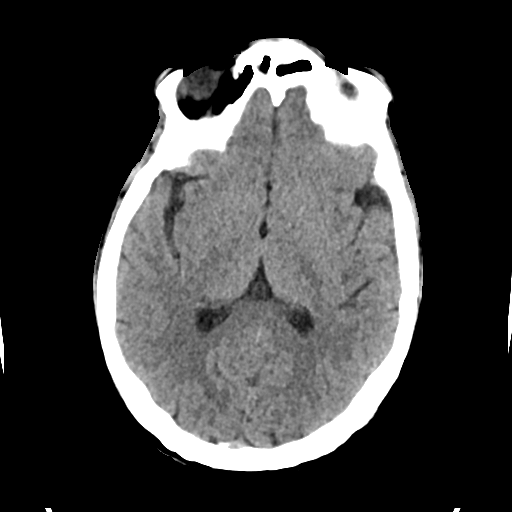
[im 20/32  brain]
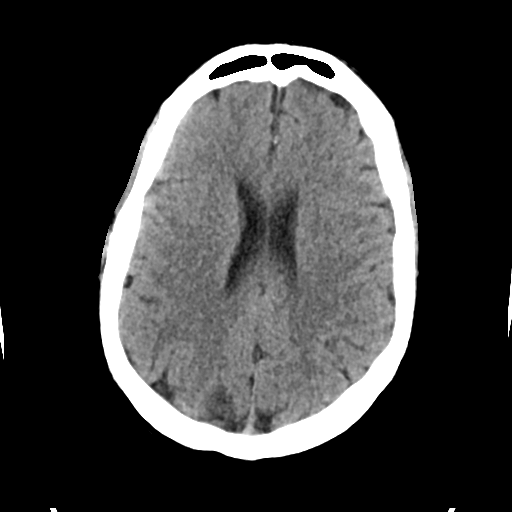
[im 20/32  bone]
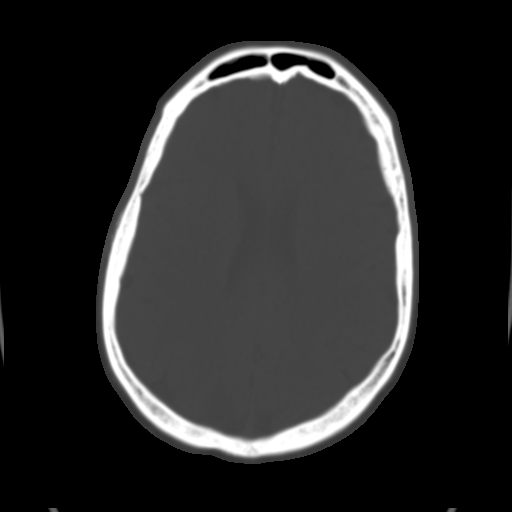
[im 24/32  brain]
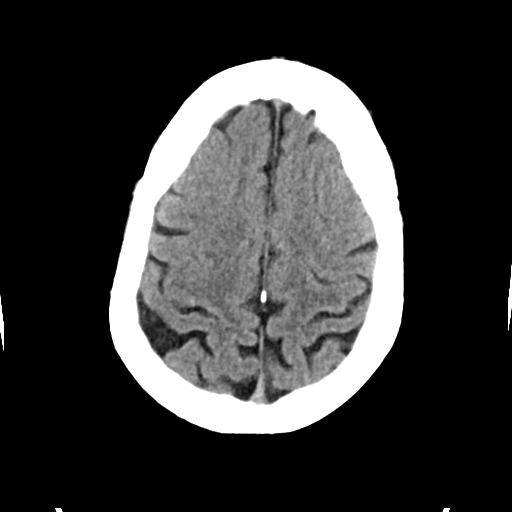
[im 28/32  brain]
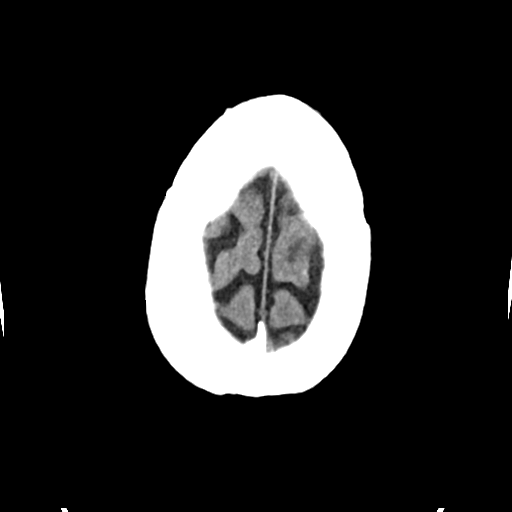

[Series 4: head bone · axial · 0.43mm/px · z∈[+1088,+1144]mm · 4 of 80 slices shown]
[im 8/80  bone]
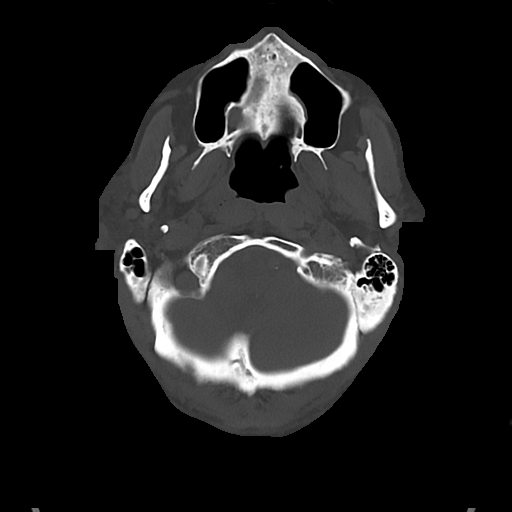
[im 16/80  bone]
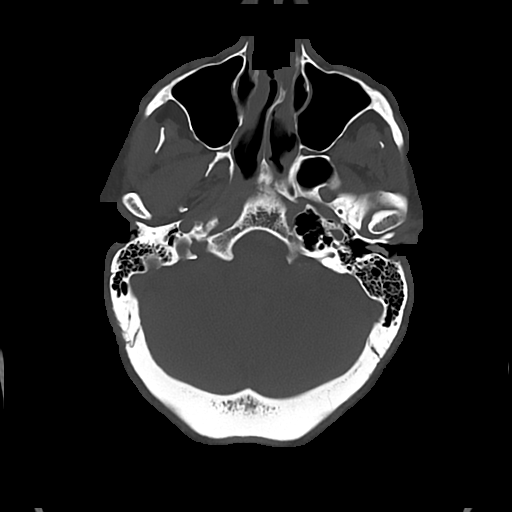
[im 24/80  bone]
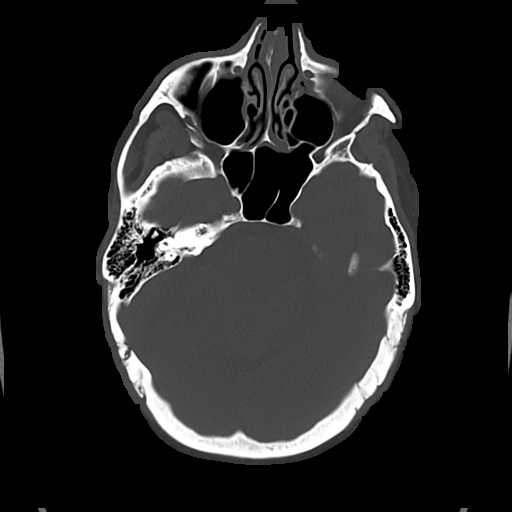
[im 36/80  bone]
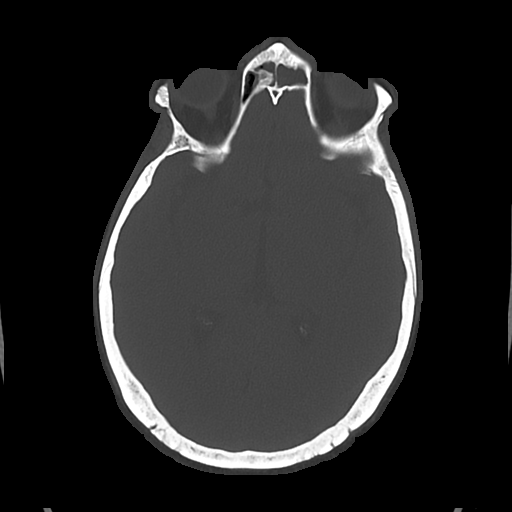

[Series 5: cor soft · coronal · 0.33mm/px · 3 of 67 slices shown]
[im 23/67  brain]
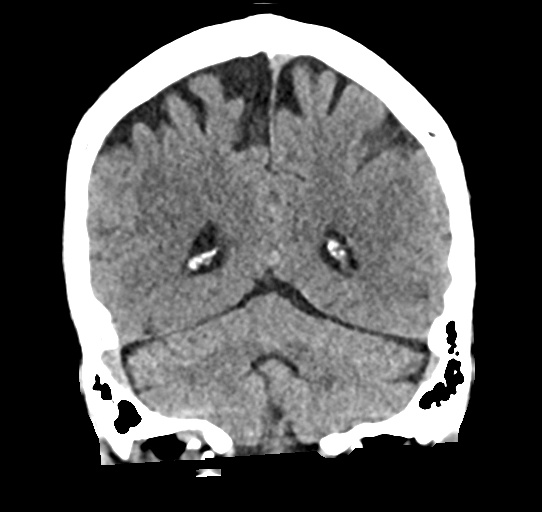
[im 30/67  brain]
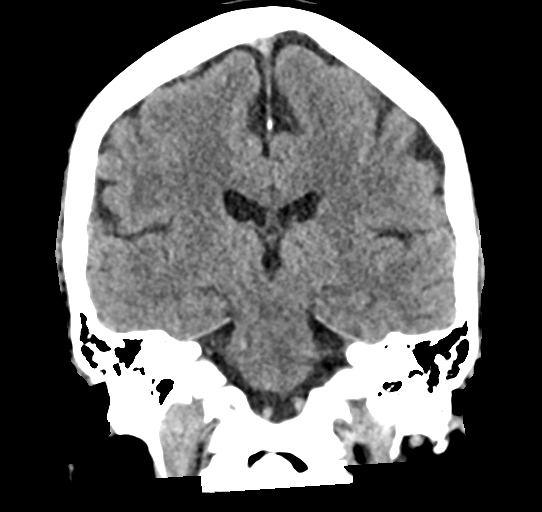
[im 37/67  brain]
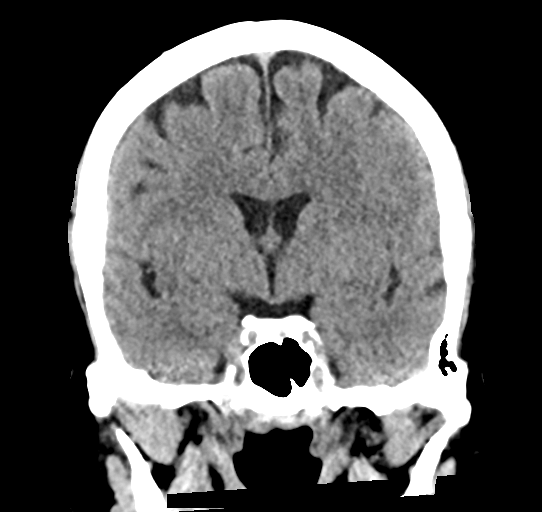

[Series 6: sag soft · sagittal · 0.35mm/px · 3 of 55 slices shown]
[im 19/55  brain]
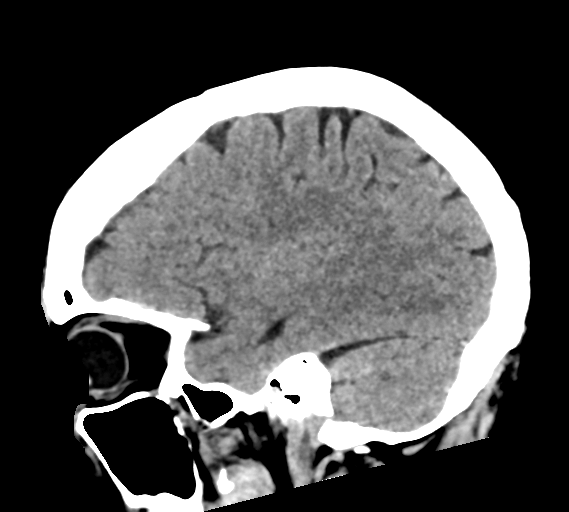
[im 28/55  brain]
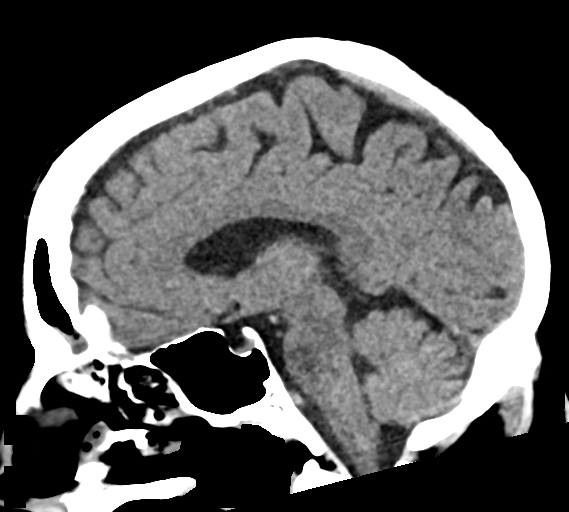
[im 37/55  brain]
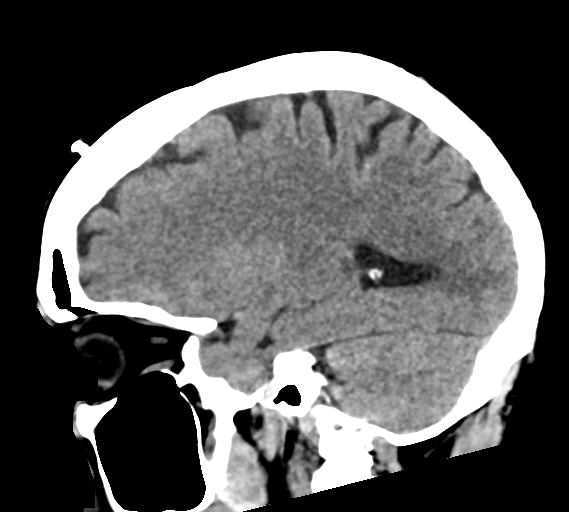

[17 of 47 positions shown; findings below may reference images not displayed]

FINDINGS: Brain: Cerebral hemispheres appear normal. No focal cerebellar
finding. One could question asymmetric low-density in the left pons,
but this may relate to beam hardening. No hemorrhage, hydrocephalus
or extra-axial collection

Vascular: No abnormal vascular finding.

Skull: Normal

Sinuses/Orbits: Clear/normal

Other: None

ASPECTS (Alberta Stroke Program Early CT Score)

- Ganglionic level infarction (caudate, lentiform nuclei, internal
capsule, insula, M1-M3 cortex): 7

- Supraganglionic infarction (M4-M6 cortex): 3

Total score (0-10 with 10 being normal): 10
IMPRESSION: 1. No acute finding by CT. One could question asymmetric low-density
in the left pons, but this may relate to beam hardening.
2. ASPECTS is 10.
3. These results were communicated to Dr. Fhino at [DATE] pmon
06/16/2020by text page via the [employer] messaging system.

## 2021-11-13 ENCOUNTER — Ambulatory Visit (HOSPITAL_COMMUNITY): Payer: Medicare Other | Attending: Cardiology

## 2021-11-13 DIAGNOSIS — I200 Unstable angina: Secondary | ICD-10-CM | POA: Diagnosis not present

## 2021-11-13 LAB — MYOCARDIAL PERFUSION IMAGING
LV dias vol: 91 mL (ref 62–150)
LV sys vol: 40 mL
Nuc Stress EF: 56 %
Peak HR: 86 {beats}/min
Rest HR: 64 {beats}/min
Rest Nuclear Isotope Dose: 9.8 mCi
ST Depression (mm): 0 mm
Stress Nuclear Isotope Dose: 30.9 mCi
TID: 0.92

## 2021-11-13 MED ORDER — TECHNETIUM TC 99M TETROFOSMIN IV KIT
9.8000 | PACK | Freq: Once | INTRAVENOUS | Status: AC | PRN
  Administered 2021-11-13: 9.8 via INTRAVENOUS

## 2021-11-13 MED ORDER — TECHNETIUM TC 99M TETROFOSMIN IV KIT
30.9000 | PACK | Freq: Once | INTRAVENOUS | Status: AC | PRN
  Administered 2021-11-13: 30.9 via INTRAVENOUS

## 2021-11-13 MED ORDER — REGADENOSON 0.4 MG/5ML IV SOLN
0.4000 mg | Freq: Once | INTRAVENOUS | Status: AC
  Administered 2021-11-13: 0.4 mg via INTRAVENOUS

## 2021-11-15 ENCOUNTER — Ambulatory Visit (HOSPITAL_COMMUNITY)
Admission: RE | Admit: 2021-11-15 | Discharge: 2021-11-15 | Disposition: A | Discharge status: Home / Self Care | Payer: Medicare Other | Source: Ambulatory Visit | Attending: Cardiology | Admitting: Cardiology

## 2021-11-15 DIAGNOSIS — I771 Stricture of artery: Secondary | ICD-10-CM | POA: Insufficient documentation

## 2021-11-15 DIAGNOSIS — R42 Dizziness and giddiness: Secondary | ICD-10-CM | POA: Diagnosis not present

## 2021-11-19 ENCOUNTER — Ambulatory Visit (HOSPITAL_COMMUNITY)
Admission: RE | Admit: 2021-11-19 | Discharge: 2021-11-19 | Disposition: A | Discharge status: Home / Self Care | Payer: Medicare Other | Source: Ambulatory Visit | Attending: Family Medicine | Admitting: Family Medicine

## 2021-11-19 DIAGNOSIS — R42 Dizziness and giddiness: Secondary | ICD-10-CM | POA: Insufficient documentation

## 2021-11-20 NOTE — Telephone Encounter (Signed)
Patient wanted to know if he could come into the office and have someone help him put on his heart monitor

## 2021-11-20 NOTE — Progress Notes (Signed)
Pt has been made aware of normal result and verbalized understanding.  jw

## 2021-11-22 ENCOUNTER — Other Ambulatory Visit: Payer: Self-pay | Admitting: Family Medicine

## 2021-11-22 DIAGNOSIS — R269 Unspecified abnormalities of gait and mobility: Secondary | ICD-10-CM

## 2021-11-23 ENCOUNTER — Telehealth: Payer: Self-pay | Admitting: Cardiology

## 2021-11-23 NOTE — Telephone Encounter (Signed)
Patient scheduled Friday, 11/23/2021, 10:30 AM to have 14 day ZIO XT applied.  Patient will bring monitor that was mailed. 6/19.

## 2021-11-23 NOTE — Telephone Encounter (Signed)
Pt would like a call back to know when he is able to come in to get help applying Heart Monitor. This is pt's 2nd attempt calling. Please advise

## 2021-11-24 DIAGNOSIS — R55 Syncope and collapse: Secondary | ICD-10-CM

## 2021-11-28 ENCOUNTER — Other Ambulatory Visit: Payer: Self-pay

## 2021-11-28 ENCOUNTER — Telehealth: Payer: Self-pay | Admitting: Rehabilitation

## 2021-11-28 ENCOUNTER — Ambulatory Visit: Payer: Medicare Other | Attending: Family Medicine | Admitting: Rehabilitation

## 2021-11-28 ENCOUNTER — Encounter: Payer: Self-pay | Admitting: Rehabilitation

## 2021-11-28 DIAGNOSIS — R42 Dizziness and giddiness: Secondary | ICD-10-CM | POA: Insufficient documentation

## 2021-11-28 DIAGNOSIS — R2689 Other abnormalities of gait and mobility: Secondary | ICD-10-CM | POA: Insufficient documentation

## 2021-11-28 DIAGNOSIS — R269 Unspecified abnormalities of gait and mobility: Secondary | ICD-10-CM | POA: Insufficient documentation

## 2021-11-28 DIAGNOSIS — R2681 Unsteadiness on feet: Secondary | ICD-10-CM | POA: Insufficient documentation

## 2021-11-28 NOTE — Telephone Encounter (Signed)
Dr. Kathlen Mody,   Hey there! I am seeing [name] here at [facility] for PT and I did his evaluation today.  I was unable to get very much done as he is very talkative, but at the very end when I went to assess orthostatics, his BP sitting was 140/93 and upon standing x 2 mins was 171/103, standing around 4 mins was 176/97.  He then told me that he did not take his BP medications this morning, so this is likely why it was elevated, but regardless, I wanted you to be aware.  I will keep you posted at our next visit with further assessment.    Thanks,  Cameron Sprang, PT, MPT [facility] [address] [place], [place], [address] Phone: [phone]   Fax:  [phone] 11/28/21, 10:36 AM

## 2021-11-28 NOTE — Telephone Encounter (Signed)
Thanks Raquel Sarna. I will have my CMA follow up with him to see what his home BPs have been so we can decide if his meds need to be adjusted. He also has f/u next week with [place]. Bernerd Limbo - Can you call him to see if he has any BP readings from home. If not check it twice Wed and Thurs and send those readings in to [place]. Make sure he takes his BP 2 or 3 hours after meds and sits for 20 min before checking. Make sure feet are flat on the floor and he has not had any caffeine right before checking.  [address] [place], [place]    11/28/2021 5:18 PM

## 2021-11-28 NOTE — Therapy (Signed)
OUTPATIENT PHYSICAL THERAPY NEURO EVALUATION   Patient Name: Lonnie Skinner MRN: [id number] DOB:1960/10/06, 61 y.o., male Today's Date: 11/28/2021   PCP: Alen Bleacher, MD REFERRING PROVIDER: Dorris Singh, MD   PT End of Session - 11/28/21 0941     Visit Number 1    Number of Visits 17    Date for PT Re-Evaluation 01/27/22    Authorization Type [employer] Medicare (needs 10th visit PN)    Progress Note Due on Visit 10    PT Start Time 0932    PT Stop Time 1023    PT Time Calculation (min) 51 min    Activity Tolerance Other (comment)             Past Medical History:  Diagnosis Date   CAD (coronary artery disease)    a. 05/2013: NSTEMI s/p DES to mRCA (culprit vessel), residual moderate LAD disease (the patient fails medical therapy and this was found to be significant, this would be amenable to PCI).   CHF (congestive heart failure) (HCC)    CKD (chronic kidney disease), stage II    DVT (deep venous thrombosis) ([place]) 1981   Hypercholesteremia    Hyperglycemia    Hypertension    Past Surgical History:  Procedure Laterality Date   CORONARY ARTERY BYPASS GRAFT N/A 08/09/2020   Procedure: CORONARY ARTERY BYPASS GRAFTING (CABG) X  FOUR   USING  LEFT INTERNAL MAMMARY ARTERY HARVEST AND RIGHT AND LEFT ENDOSCOPIC SAPHENOUS VEIN HARVEST;  Surgeon: [name], MD;  Location: [place];  Service: Open Heart Surgery;  Laterality: N/A;   CORONARY STENT PLACEMENT  05/2013   mRCA   CORONARY/GRAFT ACUTE MI REVASCULARIZATION  2015   INTRAVASCULAR PRESSURE WIRE/FFR STUDY N/A 08/02/2020   Procedure: INTRAVASCULAR PRESSURE WIRE/FFR STUDY;  Surgeon: [name], [name], MD;  Location: [facility];  Service: Cardiovascular;  Laterality: N/A;   KNEE SURGERY Left 2009   LEFT HEART CATH AND CORONARY ANGIOGRAPHY N/A 08/02/2020   Procedure: LEFT HEART CATH AND CORONARY ANGIOGRAPHY;  Surgeon: [name], [name], MD;  Location: [facility];  Service: Cardiovascular;  Laterality: N/A;   LEFT  HEART CATHETERIZATION WITH CORONARY ANGIOGRAM N/A 06/02/2013   Procedure: LEFT HEART CATHETERIZATION WITH CORONARY ANGIOGRAM;  Surgeon: [name], MD;  Location: [facility] C[facility];  Service: Cardiovascular;  Laterality: N/A;   PERCUTANEOUS CORONARY STENT INTERVENTION (PCI-S)  06/02/2013   Procedure: PERCUTANEOUS CORONARY STENT INTERVENTION (PCI-S);  Surgeon: [name], MD;  Location: [facility] CATH LAB;  Service: Cardiovascular;;   TEE WITHOUT CARDIOVERSION N/A 08/09/2020   Procedure: TRANSESOPHAGEAL ECHOCARDIOGRAM (TEE);  Surgeon: [name], MD;  Location: [place];  Service: Open Heart Surgery;  Laterality: N/A;   Patient Active Problem List   Diagnosis Date Noted   Dizziness 11/03/2021   Chest discomfort 11/03/2021   Ataxia 11/03/2021   Cough in adult patient 04/17/2021   Increased urinary frequency 04/17/2021   Well adult health check 04/05/2021   Backache 09/20/2020   Knee pain 09/20/2020   Long term (current) use of anticoagulants 09/20/2020   Anxiety 09/20/2020   Gait abnormality 09/20/2020   Hypertension 09/20/2020   Hypertensive retinopathy, bilateral 09/20/2020   Specific muscle disorder 09/20/2020   Concern about eye disease without diagnosis 09/14/2020   S/P CABG x 4 08/09/2020   Chest pain, exertional 08/02/2020   Unstable angina ([place]) 08/02/2020   Low blood pressure, not hypotension 06/22/2020   COVID-19 virus infection 06/22/2020   Grieving 06/22/2020   Chronic  venous insufficiency 01/04/2017   Acute on chronic diastolic CHF (congestive heart failure) ([place]) 05/10/2016   CAD (coronary artery disease)    CKD (chronic kidney disease) stage 2, GFR 60-89 ml/min    History of tobacco abuse 06/02/2013   Acute myocardial infarction of other inferior wall, initial episode of care    NSTEMI (non-ST elevated myocardial infarction) ([place]) 06/01/2013   Hyperlipidemia LDL goal <70 05/19/2009   Generalized anxiety disorder 05/19/2009   NICOTINE ADDICTION 05/19/2009    ADD 05/19/2009   Essential hypertension, benign 05/19/2009   UNEQUAL LEG LENGTH 05/19/2009    ONSET DATE: 11/22/2021 (referral date)  REFERRING DIAG: R26.9 (ICD-10-CM) - Gait abnormality   THERAPY DIAG:  Unsteadiness on feet  Dizziness and giddiness  Rationale for Evaluation and Treatment Rehabilitation  SUBJECTIVE:                                                                                                                                                                                              SUBJECTIVE STATEMENT: Pt reports extensive heart history with open heart surgery 08/08/20, wife passed away from covid PNA last January, has circulation issues?? Has has MI in the past.  Notes that these dizzy spells happen sporadically and started happening since May of this year.   Pt accompanied by: self  PERTINENT HISTORY: HTN, NSTEMI, CAD, S/p CABG   PAIN:  Are you having pain? No  PRECAUTIONS: Fall  WEIGHT BEARING RESTRICTIONS No  FALLS: Has patient fallen in last 6 months? Yes. Number of falls 1  LIVING ENVIRONMENT: Lives with: lives alone and lives with their son (son helps out some) Lives in: House/apartment Stairs: No (1 small step into house from garage) Has following equipment at home: Single point cane and shower chair  PLOF: Independent with basic ADLs  PATIENT GOALS "Not feel dizzy, feel steadier on my feet"  OBJECTIVE:   DIAGNOSTIC FINDINGS: MRI was clear of any cerebellar issues, is awaiting results for cardiac monitoring, carotid testing.   COGNITION: Overall cognitive status: Within functional limits for tasks assessed   SENSATION: WFL  COORDINATION: Grossly WFL, some ROM deficits on R LE due to past surgery    POSTURE: No Significant postural limitations  LOWER EXTREMITY ROM:      Right Eval Left Eval  Hip flexion    Hip extension    Hip abduction    Hip adduction    Hip internal rotation    Hip external rotation    Knee flexion     Knee extension    Ankle dorsiflexion    Ankle plantarflexion    Ankle inversion  Ankle eversion     (Blank rows = not tested)  LOWER EXTREMITY MMT:    MMT Right Eval Left Eval  Hip flexion 4/5 4/5  Hip extension    Hip abduction 5/5 5/5  Hip adduction 5/5 5/5  Hip internal rotation    Hip external rotation    Knee flexion 5/5 5/5  Knee extension 4/5 4/5  Ankle dorsiflexion 5/5 (not full ROM due to surgery) 5/5  Ankle plantarflexion    Ankle inversion    Ankle eversion    (Blank rows = not tested) All tested in seated position.    TRANSFERS: Assistive device utilized: None  Sit to stand: Modified independence Stand to sit: Complete Independence Chair to chair:  n/a Floor:  n/a   GAIT: Gait pattern: step to pattern, step through pattern, decreased stride length, decreased hip/knee flexion- Right, decreased hip/knee flexion- Left, decreased ankle dorsiflexion- Right, trunk flexed, and wide BOS Distance walked: 13' Assistive device utilized: None Level of assistance: SBA Comments: Only able to assess gait into clinic due to time constraint, however pt with unsteady gait, using wide BOS to stabilize.   FUNCTIONAL TESTs:  Orthostatics were the only thing assessed as pt was very talkative throughout session. BP in sitting was 140/93, standing x 2 mins was 171/103, and standing x 4 mins was 176/97.  He reports that he did not take BP medication today, therefore advised him to take when he gets home and from here on out as directed to ensure we are better able to assess and treat him.     TODAY'S TREATMENT:  No treatment initiated today   PATIENT EDUCATION: Education details: Taking medications as directed, scheduling for PT and what goals and further assessments we will need to complete.  Person educated: Patient Education method: Explanation and Verbal cues Education comprehension: needs further education   HOME EXERCISE PROGRAM: None initiated today      GOALS: Goals reviewed with patient? Yes  SHORT TERM GOALS: Target date: 12/26/2021  Pt will be IND with initial HEP in order to indicate improved functional mobility and dec fall risk. Baseline: Goal status: INITIAL  2.  Will assess FGA/DGI as appropriate and update goal to indicate progress/decrease in fall risk.  Baseline:  Goal status: INITIAL  3.  Will assess gait speed and update goal to reflect progress.  Baseline:  Goal status: INITIAL  4.  Pt will perform 4 steps with single rail, ambulate x 500' outdoors over paved and grassy surfaces at S level in order to indicate safe community mobility.  Baseline:  Goal status: INITIAL  5.  Will complete vestibular evaluation if needed and add goals.  Baseline:  Goal status: INITIAL    LONG TERM GOALS: Target date: 01/23/2022  Pt will be IND with final HEP in order to indicate improved functional mobility and dec fall risk. Baseline:  Goal status: INITIAL  2.  Will add gait speed goal when assessed.  Baseline:  Goal status: INITIAL  3.  Will add balance goal when assessed.  Baseline:  Goal status: INITIAL  4.  Pt will ambulate x 1000' over unlevel outdoor surfaces (paved and grassy) while scanning environment without overt LOB in order to indicate safe return to community activities.  Baseline:  Goal status: INITIAL    ASSESSMENT:  CLINICAL IMPRESSION: Patient is a 61 y.o. male who was seen today for physical therapy evaluation and treatment for gait abnormality.   History of non-STEMI in 2015 treated with DES to the  RCA.  He underwent CABG in March 2022.  Also history of CAD and HTN.  Evaluation extremely limited due to pt being very talkative and wanting PT to know a lot of history of current situation.  Also had to spend time explaining BP issues and that he needs to take medication as directed always as BP was high today and we were unable to perform any further assessment.  Pts PA was notified of the BP issues  during this session.  Pt will benefit from further assessment of balance and BP to determine if more therapy needed.  Will write 8 week POC, and adjust as needed.    OBJECTIVE IMPAIRMENTS Abnormal gait, cardiopulmonary status limiting activity, decreased balance, decreased mobility, and dizziness.   ACTIVITY LIMITATIONS standing, squatting, stairs, bathing, and locomotion level  PARTICIPATION LIMITATIONS: driving, community activity, yard work, and church  PERSONAL FACTORS Past/current experiences and 3+ comorbidities: see above  are also affecting patient's functional outcome.   REHAB POTENTIAL: Good  CLINICAL DECISION MAKING: Evolving/moderate complexity  EVALUATION COMPLEXITY: Moderate  PLAN: PT FREQUENCY: 2x/week  PT DURATION: 8 weeks  PLANNED INTERVENTIONS: Therapeutic exercises, Therapeutic activity, Neuromuscular re-education, Balance training, Gait training, Patient/Family education, Stair training, Vestibular training, DME instructions, and Aquatic Therapy  PLAN FOR NEXT SESSION: Assess BP seated and during mobility, assess FGA, stairs, gait speed, initiate HEP to address balance deficits.  If we feel issues are more pure cardiac related, we can wait for his follow up with Cardiology next week.    Cameron Sprang, PT, MPT [facility] [address] [place], [place], [address] Phone: [phone]   Fax:  [phone] 11/28/21, 11:02 AM

## 2021-11-28 NOTE — Addendum Note (Signed)
Addended by: Cameron Sprang A on: 11/28/2021 11:05 AM   Modules accepted: Orders

## 2021-11-29 NOTE — Telephone Encounter (Signed)
Call made to Lonnie Skinner in regards to message below. He has been made aware that we would like for him to monitor this blood pressure twice today and tomorrow, 2 hours after he takes his medications, sitting in a chair, with feet flat on the floor.  Lonnie Skinner has been advised to make sure he hasn't had any caffeine prior to checking his blood pressure.  Lonnie Skinner will call us on Friday morning with those readings.

## 2021-12-01 ENCOUNTER — Telehealth: Payer: Self-pay | Admitting: Cardiology

## 2021-12-01 NOTE — Telephone Encounter (Signed)
Patient called to follow-up on the BP readings and noted that his heart monitor fell off last night and he wanted to let the RN know.

## 2021-12-01 NOTE — Telephone Encounter (Signed)
Pt saw Richardson Dopp PA-C back on 6/19 where changes were made and pt advised to track his blood pressures and send those in accordingly thereafter.  Will forward recordings to [employer] PA-C and CMA for further review, advisement, and follow-up with the pt.

## 2021-12-01 NOTE — Telephone Encounter (Signed)
Pt c/o BP issue: STAT if pt c/o blurred vision, one-sided weakness or slurred speech  1. What are your last 5 BP readings?  11/29/21 106/62 & 108/58 11/30/21 110/55 & 124/74 12/01/21 112/54  2. Are you having any other symptoms (ex. Dizziness, headache, blurred vision, passed out)? No  3. What is your BP issue? Calling to report BP readings per our office request. Please advise.

## 2021-12-02 NOTE — Telephone Encounter (Signed)
BP looks optimal. Continue current medications/treatment plan and follow up as scheduled.  Richardson Dopp, PA-C    12/02/2021 4:15 PM

## 2021-12-03 NOTE — Progress Notes (Unsigned)
Cardiology Office Note:    Date:  12/05/2021   ID:  Lonnie Skinner, DOB 1960/11/06, MRN [id number]  PCP:  Alen Bleacher, MD   [place]  Cardiologist:  Freada Bergeron, MD  Advanced Practice Provider:  No care team member to display Electrophysiologist:  None    Referring MD: Alen Bleacher, MD    History of Present Illness:    Lonnie Skinner is a 61 y.o. male with a hx of CAD/NSTEMI s/p DES to [facility] with residual moderate LAD disease in 9924, chronic diastolic heart failure, HTN, HLD, CKD who was previously followed seen by Dr. [name] who now returns to clinic for follow-up.  Admitted back in early 2015 with CAD/NSTEMI - s/p DES to the mRCA, residual moderate LAD disease (if fails on medical therapy would proceed with PCI). He has typically not followed CV risk factor modification.   Was seen by me in clinic on 07/27/20 where he was having chest pain on exertion. Cath 08/02/20 showed critical 95% prox LAD, 70% OM1, 60% OM2, 70% prox RCA with abnormal FFR. He subsequently underwent CABG 07/2020 with LIMA-LAD, S-PDA, S-OM1, S-OM2 with Dr. Kipp Brood. TTE 08/12/20 with LVEF 60-65%, no WMA.   Was last seen on 10/2021 by Richardson Dopp for dizziness. Recommended for zio monitor which is pending. Carotid ultrasound without significant disease. MRI brain without significant findings.  Today, the patient seems to be doing overall better. Less episodes of dizziness/lightheadedness since prior visit. He has changed from drinking sweet tea to water and this has helped. Notably, most of his symptoms occurred when standing at church or a baseball game in the heat. Extensive work-up as above has been reassuring. Blood pressure is well controlled today.   Past Medical History:  Diagnosis Date   CAD (coronary artery disease)    a. 05/2013: NSTEMI s/p DES to mRCA (culprit vessel), residual moderate LAD disease (the patient fails medical therapy and this was found to be  significant, this would be amenable to PCI).   CHF (congestive heart failure) (HCC)    CKD (chronic kidney disease), stage II    DVT (deep venous thrombosis) ([place]) 1981   Hypercholesteremia    Hyperglycemia    Hypertension     Past Surgical History:  Procedure Laterality Date   CORONARY ARTERY BYPASS GRAFT N/A 08/09/2020   Procedure: CORONARY ARTERY BYPASS GRAFTING (CABG) X  FOUR   USING  LEFT INTERNAL MAMMARY ARTERY HARVEST AND RIGHT AND LEFT ENDOSCOPIC SAPHENOUS VEIN HARVEST;  Surgeon: [name], MD;  Location: [place];  Service: Open Heart Surgery;  Laterality: N/A;   CORONARY STENT PLACEMENT  05/2013   mRCA   CORONARY/GRAFT ACUTE MI REVASCULARIZATION  2015   INTRAVASCULAR PRESSURE WIRE/FFR STUDY N/A 08/02/2020   Procedure: INTRAVASCULAR PRESSURE WIRE/FFR STUDY;  Surgeon: [name], [name], MD;  Location: [place] CV [facility];  Service: Cardiovascular;  Laterality: N/A;   KNEE SURGERY Left 2009   LEFT HEART CATH AND CORONARY ANGIOGRAPHY N/A 08/02/2020   Procedure: LEFT HEART CATH AND CORONARY ANGIOGRAPHY;  Surgeon: [name], [name], MD;  Location: [place] CV [facility];  Service: Cardiovascular;  Laterality: N/A;   LEFT HEART CATHETERIZATION WITH CORONARY ANGIOGRAM N/A 06/02/2013   Procedure: LEFT HEART CATHETERIZATION WITH CORONARY ANGIOGRAM;  Surgeon: [name], MD;  Location: [facility] C[facility];  Service: Cardiovascular;  Laterality: N/A;   PERCUTANEOUS CORONARY STENT INTERVENTION (PCI-S)  06/02/2013   Procedure: PERCUTANEOUS CORONARY STENT INTERVENTION (PCI-S);  Surgeon: [name],  MD;  Location: [place] CATH LAB;  Service: Cardiovascular;;   TEE WITHOUT CARDIOVERSION N/A 08/09/2020   Procedure: TRANSESOPHAGEAL ECHOCARDIOGRAM (TEE);  Surgeon: [name], MD;  Location: [place];  Service: Open Heart Surgery;  Laterality: N/A;    Current Medications: Current Meds  Medication Sig   ALPRAZolam (XANAX) 0.25 MG tablet TAKE 1 TABLET(0.25 MG) BY MOUTH TWICE DAILY AS NEEDED  FOR ANXIETY   aspirin 81 MG EC tablet Take 1 tablet (81 mg total) by mouth daily.   atorvastatin (LIPITOR) 80 MG tablet Take 1 tablet (80 mg total) by mouth every evening.   carvedilol (COREG) 6.25 MG tablet Take 1 tablet (6.25 mg total) by mouth 2 (two) times daily.   clopidogrel (PLAVIX) 75 MG tablet Take 1 tablet (75 mg total) by mouth daily.   furosemide (LASIX) 20 MG tablet Take 1 tablet (20 mg total) by mouth daily.   nitroGLYCERIN (NITROSTAT) 0.4 MG SL tablet Place 1 tablet (0.4 mg total) under the tongue every 5 (five) minutes as needed for chest pain (up to 3 doses).   spironolactone (ALDACTONE) 25 MG tablet Take 0.5 tablets (12.5 mg total) by mouth daily.   traMADol (ULTRAM) 50 MG tablet Take 1 tablet (50 mg total) by mouth every 4 (four) hours as needed for moderate pain.   [DISCONTINUED] furosemide (LASIX) 40 MG tablet Take 1 tablet (40 mg total) by mouth daily.     Allergies:   Penicillins   Social History   Socioeconomic History   Marital status: Widowed    Spouse name: Not on file   Number of children: 2   Years of education: 18   Highest education level: 11th grade  Occupational History    Comment: Disabled  Tobacco Use   Smoking status: Former    Types: Cigarettes    Quit date: 2007    Years since quitting: 16.5    Passive exposure: Past   Smokeless tobacco: Never  Vaping Use   Vaping Use: Never used  Substance and Sexual Activity   Alcohol use: No   Drug use: No   Sexual activity: Not Currently    Birth control/protection: None  Other Topics Concern   Not on file  Social History Narrative   Patient lives alone in. Patients wife died ~1 year ago.    Patient has support from his son and some friends.    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    Patient recently graduated cardiac rehab and has been going to [employer] 3x per week for ~ 2 hours.    Social Determinants of Health   Financial Resource Strain: Low Risk  (07/13/2021)   Overall  Financial Resource Strain (CARDIA)    Difficulty of Paying Living Expenses: Not hard at all  Food Insecurity: No Food Insecurity (07/13/2021)   Hunger Vital Sign    Worried About Running Out of Food in the Last Year: Never true    Ran Out of Food in the Last Year: Never true  Transportation Needs: No Transportation Needs (07/13/2021)   PRAPARE - Hydrologist (Medical): No    Lack of Transportation (Non-Medical): No  Physical Activity: Sufficiently Active (07/13/2021)   Exercise Vital Sign    Days of Exercise per Week: 3 days    Minutes of Exercise per Session: 120 min  Stress: No Stress Concern Present (07/13/2021)   Camp    Feeling of  Stress : Not at all  Social Connections: Socially Isolated (07/13/2021)   Social Connection and Isolation Panel [NHANES]    Frequency of Communication with Friends and Family: More than three times a week    Frequency of Social Gatherings with Friends and Family: More than three times a week    Attends Religious Services: Never    Marine scientist or Organizations: No    Attends Archivist Meetings: Never    Marital Status: Widowed     Family History: The patient's family history includes Bone cancer in his father; CAD in an other family member; Diabetes in his mother; Heart attack in his brother and father; Heart disease in his brother; Heart failure in his father. There is no history of Colon cancer, Pancreatic cancer, Liver disease, or Esophageal cancer.  ROS:   Please see the history of present illness.    Review of Systems  Constitutional:  Negative for chills and fever.  HENT:  Negative for hearing loss.   Eyes:  Negative for blurred vision and redness.  Respiratory:  Negative for shortness of breath.   Cardiovascular:  Positive for leg swelling (Bilateral LE). Negative for chest pain, palpitations, orthopnea, claudication and PND.   Gastrointestinal:  Negative for melena, nausea and vomiting.  Genitourinary:  Negative for dysuria and flank pain.  Musculoskeletal:  Negative for myalgias.  Skin:  Negative for rash.  Neurological:  Negative for dizziness and loss of consciousness.  Endo/Heme/Allergies:  Negative for polydipsia.  Psychiatric/Behavioral:  Negative for depression. The patient is not nervous/anxious.     EKGs/Labs/Other Studies Reviewed:    The following studies were reviewed today: Carotid Ultrasound 10/2021: Summary:  Right Carotid: The extracranial vessels were near-normal with only minimal  wall                 thickening or plaque.   Left Carotid: The extracranial vessels were near-normal with only minimal  wall                thickening or plaque.   Vertebrals:  Bilateral vertebral arteries demonstrate antegrade flow.  Subclavians: Normal flow hemodynamics were seen in bilateral subclavian               arteries.   Lower Extremity Venous Reflux 09/20/20 Summary:  Right:  - No evidence of acute deep vein thrombosis seen in the right lower extremity, from the common femoral through the popliteal veins.  - Venous reflux is noted in the right common femoral vein.  - Venous reflux is noted in the right sapheno-femoral junction.  - Venous reflux is noted in the right greater saphenous vein in the proximal thigh.  - Venous reflux is noted in the right short saphenous vein, fossa area.     Left:  - No evidence of acute deep vein thrombosis seen in the left lower extremity, from the common femoral through the popliteal veins.  - No evidence of superficial venous reflux seen in the left greater saphenous vein.  - No evidence of superficial venous reflux seen in the left short saphenous vein.  - Venous reflux is noted in the left common femoral vein.  - Venous reflux is noted in the left sapheno-femoral junction.   Echo TEE 08/09/20 PRE-OP FINDINGS   Left Ventricle: The left ventricle has normal  systolic function, with an ejection fraction of 55-60%. The cavity size was normal. There is no increase in left ventricular wall thickness.   Right Ventricle: The right ventricle  has normal systolic function. The cavity was normal. There is no increase in right ventricular wall thickness.   Left Atrium: Left atrial size was not assessed. No left atrial/left atrial appendage thrombus was detected.   Right Atrium: Right atrial size was not assessed.   Interatrial Septum: The interatrial septum was not assessed.   Pericardium: A small pericardial effusion is present. The pericardial effusion is localized near the right ventricle.   Mitral Valve: The mitral valve is normal in structure. Mitral valve regurgitation is trivial by color flow Doppler.   Tricuspid Valve: The tricuspid valve was normal in structure. Tricuspid valve regurgitation is trivial by color flow Doppler.   Aortic Valve: The aortic valve is bicuspid Aortic valve regurgitation was not visualized by color flow Doppler. There is no stenosis of the aortic valve.   Pulmonic Valve: The pulmonic valve was normal in structure. Pulmonic valve regurgitation is trivial by color flow Doppler.    Cath 08/02/20: Prox LAD lesion is 95% stenosed. Prox LAD to Mid LAD lesion is 80% stenosed. 1st Mrg lesion is 75% stenosed. 2nd Mrg lesion is 60% stenosed. Prox RCA to Mid RCA lesion is 70% stenosed. Previously placed Mid RCA-1 stent (unknown type) is widely patent. Mid RCA-2 lesion is 50% stenosed. The left ventricular systolic function is normal. LV end diastolic pressure is mildly elevated. The left ventricular ejection fraction is 55-65% by visual estimate.   1. 3 vessel obstructive CAD    - critical 95% proximal LAD    - 70% OM1    - 60% OM2    - 70% mid RCA proximal to prior stent with abnormal RFR 2. Normal LV function 3. Mildly elevated LVEDP   Plan: recommend CABG. Will hold Plavix. Admit to telemetry and treat with IV  heparin. Check Echo.   TTE 08/02/20: IMPRESSIONS   1. Left ventricular ejection fraction, by estimation, is 60 to 65%. The left ventricle has normal function. The left ventricle has no regional wall motion abnormalities. Left ventricular diastolic parameters were normal.   2. Right ventricular systolic function is normal. The right ventricular size is normal. Tricuspid regurgitation signal is inadequate for assessing PA pressure.   3. The mitral valve is normal in structure. No evidence of mitral valve regurgitation. No evidence of mitral stenosis.   4. The aortic valve is normal in structure. Aortic valve regurgitation is not visualized. No aortic stenosis is present.   5. The inferior vena cava is normal in size with greater than 50% respiratory variability, suggesting right atrial pressure of 3 mmHg.   06/18/13  ECHO Study Conclusions  Left ventricle: The cavity size was normal. There was mild focal basal hypertrophy of the septum. Systolic function was normal. The estimated ejection fraction was in the range of 55% to 60%. Wall motion was normal; there were no regional wall motion abnormalities. Features are consistent with a pseudonormal left ventricular filling pattern, with concomitant abnormal relaxation and increased filling pressure (grade 2 diastolic dysfunction).  Impressions:  - No prior study for comparison.  ANGIOGRAPHIC DATA: The left main coronary artery is absent. There appear to be separate ostia of the LAD and circumflex.  The left anterior descending artery is a large vessel which reaches the apex. In the mid vessel, there is a 50-70% lesion in the mid LAD. There is mild to moderate diffuse disease. There 2 large diagonals which are widely patent. The apical LAD is small but patent.  The left circumflex artery is a medium size vessel.  There appears to be some vasospasm proximally. There is a large first marginal which is patent. The second marginal is a larger vessel  which branches across the lateral wall. This appears widely patent.  The right coronary artery is a large dominant vessel. In the mid vessel, there is a focal 99% stenosis with visible thrombus. The posterior lateral artery is very large and widely patent. The posterior descending artery is widely patent.  LEFT VENTRICULOGRAM: Left ventricular angiogram was not done. LVEDP was 13 mmHg.  PCI NARRATIVE: A JR 4 guiding catheters u used to engage the RCA. Angiomax was used for anticoagulation. Initially, a pro-water wire was advanced but would not cross the lesion. A Fielder XT density cross the lesion in the mid right coronary artery. A 2.5 x 12 balloon was used to predilate the lesion. A 2.75 x 16 promise drug-eluting stent was then deployed. The stent was post dilated with a 3.25 x 12 noncompliant balloon. There is an excellent angiographic result. There is no residual stenosis. There were several areas of vasospasm during intervention noted in the distal right and posterior lateral artery. Both resolved with intracoronary nitroglycerin.  EKG:  EKG not performed today  Recent Labs: 04/05/2021: ALT 14 10/30/2021: BUN 15; Creatinine, Ser 1.21; Hemoglobin 15.9; Platelets 229; Potassium 4.6; Sodium 137  Recent Lipid Panel    Component Value Date/Time   CHOL 119 04/05/2021 1701   TRIG 89 04/05/2021 1701   HDL 49 04/05/2021 1701   CHOLHDL 2.4 04/05/2021 1701   CHOLHDL 3.1 02/13/2016 1540   VLDL 21 02/13/2016 1540   LDLCALC 53 04/05/2021 1701     Risk Assessment/Calculations:       Physical Exam:    VS:  BP 110/70 (BP Location: Left Arm, Patient Position: Sitting, Cuff Size: Normal)   Pulse 77   Ht '5\' 8"'$  (1.727 m)   Wt 220 lb (99.8 kg)   SpO2 97%   BMI 33.45 kg/m     Wt Readings from Last 3 Encounters:  12/05/21 220 lb (99.8 kg)  12/04/21 218 lb 12.8 oz (99.2 kg)  11/13/21 216 lb (98 kg)     GEN:  Well nourished, well developed in no acute distress HEENT: Normal NECK: No JVD; No  carotid bruits CARDIAC: RRR, no murmurs, rubs, gallops RESPIRATORY:  Clear to auscultation without rales, wheezing or rhonchi  ABDOMEN: Soft, non-tender, non-distended MUSCULOSKELETAL: Mild bilateral LE edema; No deformity; Chronic venous stasis changes SKIN: Warm and dry NEUROLOGIC:  Alert and oriented x 3 PSYCHIATRIC:  Normal affect   ASSESSMENT:    1. Dizziness   2. Coronary artery disease involving native coronary artery of native heart without angina pectoris   3. Hyperlipidemia LDL goal <70   4. Near syncope   5. Essential hypertension, benign   6. CKD (chronic kidney disease) stage 2, GFR 60-89 ml/min   7. Chronic diastolic (congestive) heart failure (HCC)     PLAN:    In order of problems listed above:  #Dizziness: Suspect related to orthostasis due to episodes occurring while standing in the heat or at church. Reassuring work-up and BP well controlled with no episodes of lows. MRI head without acute pathology. Carotids with no significant disease. Zio pending. -Decrease lasix to '20mg'$  daily and monitor response -Follow-up zio monitor  #Severe multivessel CAD s/p CABG Patient with history of NSTEMI in in 2015 s/p DES to mid-RCA with known residual moderate LAD disease. Presented to clinic with exertional chest pain in 07/2020 found to have multivessel  CAD s/p 4v CABG on 07/2020 with L-LAD, S-PDA, S-OM1, S-OM2. Doing well.  -Continue plavix '75mg'$  daily -Continue aspirin '81mg'$  -Continue carvedilol 6.'25mg'$  BID -Continue atorvastatin '80mg'$  daily  #Essential hypertension: Well controlled and at goal <120/80s. -Continue carvedilol 6.'25mg'$  BID  #Chronic diastolic CHF: Stable and euvolemic on examination. Suspect he may be having some orthostatic symptoms. Will trial him on lower dose lasix and monitor response. -Decrease lasix to '20mg'$  daily -Continue coreg 6.'25mg'$  BID -Continue spiro 12.'5mg'$  daily -Low Na diet  #CKD: Stable. -Follow-up with PCP as  scheduled  #HLD: -Continue atorvastatin '80mg'$  as above -Check lipids today   Medication Adjustments/Labs and Tests Ordered: Current medicines are reviewed at length with the patient today.  Concerns regarding medicines are outlined above.  Orders Placed This Encounter  Procedures   Lipid Profile   Meds ordered this encounter  Medications   furosemide (LASIX) 20 MG tablet    Sig: Take 1 tablet (20 mg total) by mouth daily.    Dispense:  90 tablet    Refill:  3    Dose decrease    Patient Instructions  Medication Instructions:   DECREASE YOUR LASIX TO 20 MG BY MOUTH DAILY  *If you need a refill on your cardiac medications before your next appointment, please call your pharmacy*   Lab Work:  TODAY--LIPIDS  If you have labs (blood work) drawn today and your tests are completely normal, you will receive your results only by: [place] (if you have MyChart) OR A paper copy in the mail If you have any lab test that is abnormal or we need to change your treatment, we will call you to review the results.   Follow-Up:  IN SEPT ON 9/25 AS SCHEDULED WITH DR. [name]    Important Information About Sugar        Signed, Freada Bergeron, MD  12/05/2021 10:49 AM    [place]

## 2021-12-04 ENCOUNTER — Ambulatory Visit (INDEPENDENT_AMBULATORY_CARE_PROVIDER_SITE_OTHER): Payer: Medicare Other | Admitting: Student

## 2021-12-04 ENCOUNTER — Encounter: Payer: Self-pay | Admitting: Student

## 2021-12-04 VITALS — BP 138/87 | HR 64 | Ht 68.0 in | Wt 218.8 lb

## 2021-12-04 DIAGNOSIS — R42 Dizziness and giddiness: Secondary | ICD-10-CM | POA: Diagnosis not present

## 2021-12-04 NOTE — Patient Instructions (Addendum)
It was wonderful to see you today. Thank you for allowing me to be a part of your care. Below is a short summary of what we discussed at your visit today:   Blood pressure today looks good  Please you are compliant with your medications especially Aspirin, Plavix, and carvedilol   If you have any questions or concerns, please do not hesitate to contact us via phone or MyChart message.   Alen Bleacher, MD [facility]

## 2021-12-04 NOTE — Progress Notes (Signed)
    SUBJECTIVE:   CHIEF COMPLAINT / HPI:   61 year old male with complex cardiac hx presents today after recent complain of light headedness. His initial work up were unremarkable and MRI was negative for any intracranial abnormalities. Patient report he's symptoms are improved but still intermittently get orthostatic. Seen by Cards and stress test was unremarkable. Currently on Holter monitoring. Denies any chest pain, palpitation or leg swelling.   PERTINENT  PMH / PSH: NSTEMI, CAD, HTN  OBJECTIVE:   BP 138/87   Pulse 64   Ht '5\' 8"'$  (1.727 m)   Wt 218 lb 12.8 oz (99.2 kg)   SpO2 100%   BMI 33.27 kg/m    Physical Exam General: well appearing, NAD, Oriented x4 Cardiovascular: RRR, No Murmurs, Normal S2/S2 Respiratory: CTAB, No wheezing or Rales Extremities: No edema on extremities     ASSESSMENT/PLAN:   Dizziness He reports improvement in his dizziness. Currently being followed by cardiology, had normal stress test and labs were normal.  Encourage patient to continue physical therapy for gait balance, and and behavioral adjustment. Likely orthostatics in the absence of cardiac etiology or electrolyte imbalance. Informed patient given his optimal A1c and no history of DM, rechecking A1c at this visit is not recommended.      Alen Bleacher, MD [place]

## 2021-12-04 NOTE — Assessment & Plan Note (Signed)
He reports improvement in his dizziness. Currently being followed by cardiology, had normal stress test and labs were normal.  Encourage patient to continue physical therapy for gait balance, and and behavioral adjustment. Likely orthostatics in the absence of cardiac etiology or electrolyte imbalance. Informed patient given his optimal A1c and no history of DM, rechecking A1c at this visit is not recommended.

## 2021-12-04 NOTE — Telephone Encounter (Signed)
I spoke with patient and gave him information from [place], [place].  He is seeing Dr [name] tomorrow.  Patient reports monitor fell off on 7/13.  It was applied on 7/6.  He will mail it back.

## 2021-12-05 ENCOUNTER — Encounter: Payer: Self-pay | Admitting: Rehabilitation

## 2021-12-05 ENCOUNTER — Ambulatory Visit (INDEPENDENT_AMBULATORY_CARE_PROVIDER_SITE_OTHER): Payer: Medicare Other | Admitting: Cardiology

## 2021-12-05 ENCOUNTER — Ambulatory Visit: Payer: Medicare Other | Admitting: Rehabilitation

## 2021-12-05 ENCOUNTER — Encounter: Payer: Self-pay | Admitting: Cardiology

## 2021-12-05 VITALS — BP 110/70 | HR 77 | Ht 68.0 in | Wt 220.0 lb

## 2021-12-05 DIAGNOSIS — I2510 Atherosclerotic heart disease of native coronary artery without angina pectoris: Secondary | ICD-10-CM

## 2021-12-05 DIAGNOSIS — I5032 Chronic diastolic (congestive) heart failure: Secondary | ICD-10-CM | POA: Diagnosis not present

## 2021-12-05 DIAGNOSIS — R42 Dizziness and giddiness: Secondary | ICD-10-CM

## 2021-12-05 DIAGNOSIS — E785 Hyperlipidemia, unspecified: Secondary | ICD-10-CM | POA: Diagnosis not present

## 2021-12-05 DIAGNOSIS — R2689 Other abnormalities of gait and mobility: Secondary | ICD-10-CM

## 2021-12-05 DIAGNOSIS — I10 Essential (primary) hypertension: Secondary | ICD-10-CM | POA: Diagnosis not present

## 2021-12-05 DIAGNOSIS — R2681 Unsteadiness on feet: Secondary | ICD-10-CM | POA: Diagnosis not present

## 2021-12-05 DIAGNOSIS — N182 Chronic kidney disease, stage 2 (mild): Secondary | ICD-10-CM | POA: Diagnosis not present

## 2021-12-05 DIAGNOSIS — R55 Syncope and collapse: Secondary | ICD-10-CM | POA: Diagnosis not present

## 2021-12-05 DIAGNOSIS — R269 Unspecified abnormalities of gait and mobility: Secondary | ICD-10-CM | POA: Diagnosis not present

## 2021-12-05 MED ORDER — FUROSEMIDE 20 MG PO TABS: 20.0000 mg | ORAL_TABLET | Freq: Every day | ORAL | 3 refills | Status: DC

## 2021-12-05 NOTE — Therapy (Signed)
OUTPATIENT PHYSICAL THERAPY NEURO TREATMENT   Patient Name: Lonnie Skinner MRN: [id number] DOB:Oct 14, 1960, 61 y.o., male Today's Date: 12/05/2021   PCP: Alen Bleacher, MD REFERRING PROVIDER: Dorris Singh, MD   PT End of Session - 12/05/21 1309     Visit Number 2    Number of Visits 17    Date for PT Re-Evaluation 01/27/22    Authorization Type UHC Medicare (needs 10th visit PN)    Progress Note Due on Visit 10    Activity Tolerance Other (comment)             Past Medical History:  Diagnosis Date   CAD (coronary artery disease)    a. 05/2013: NSTEMI s/p DES to mRCA (culprit vessel), residual moderate LAD disease (the patient fails medical therapy and this was found to be significant, this would be amenable to PCI).   CHF (congestive heart failure) (HCC)    CKD (chronic kidney disease), stage II    DVT (deep venous thrombosis) ([place]) 1981   Hypercholesteremia    Hyperglycemia    Hypertension    Past Surgical History:  Procedure Laterality Date   CORONARY ARTERY BYPASS GRAFT N/A 08/09/2020   Procedure: CORONARY ARTERY BYPASS GRAFTING (CABG) X  FOUR   USING  LEFT INTERNAL MAMMARY ARTERY HARVEST AND RIGHT AND LEFT ENDOSCOPIC SAPHENOUS VEIN HARVEST;  Surgeon: [name], MD;  Location: [place];  Service: Open Heart Surgery;  Laterality: N/A;   CORONARY STENT PLACEMENT  05/2013   mRCA   CORONARY/GRAFT ACUTE MI REVASCULARIZATION  2015   INTRAVASCULAR PRESSURE WIRE/FFR STUDY N/A 08/02/2020   Procedure: INTRAVASCULAR PRESSURE WIRE/FFR STUDY;  Surgeon: [name], [name], MD;  Location: [place] [facility];  Service: Cardiovascular;  Laterality: N/A;   KNEE SURGERY Left 2009   LEFT HEART CATH AND CORONARY ANGIOGRAPHY N/A 08/02/2020   Procedure: LEFT HEART CATH AND CORONARY ANGIOGRAPHY;  Surgeon: [name], [name], MD;  Location: [place] [facility];  Service: Cardiovascular;  Laterality: N/A;   LEFT HEART CATHETERIZATION WITH CORONARY ANGIOGRAM N/A 06/02/2013   Procedure: LEFT HEART  CATHETERIZATION WITH CORONARY ANGIOGRAM;  Surgeon: [name], MD;  Location: [facility] CATH LAB;  Service: Cardiovascular;  Laterality: N/A;   PERCUTANEOUS CORONARY STENT INTERVENTION (PCI-S)  06/02/2013   Procedure: PERCUTANEOUS CORONARY STENT INTERVENTION (PCI-S);  Surgeon: [name], MD;  Location: [facility] C[facility] LAB;  Service: Cardiovascular;;   TEE WITHOUT CARDIOVERSION N/A 08/09/2020   Procedure: TRANSESOPHAGEAL ECHOCARDIOGRAM (TEE);  Surgeon: [name], MD;  Location: [place];  Service: Open Heart Surgery;  Laterality: N/A;   Patient Active Problem List   Diagnosis Date Noted   Dizziness 11/03/2021   Ataxia 11/03/2021   Cough in adult patient 04/17/2021   Increased urinary frequency 04/17/2021   Well adult health check 04/05/2021   Knee pain 09/20/2020   Long term (current) use of anticoagulants 09/20/2020   Anxiety 09/20/2020   Gait abnormality 09/20/2020   Hypertension 09/20/2020   Hypertensive retinopathy, bilateral 09/20/2020   Specific muscle disorder 09/20/2020   Concern about eye disease without diagnosis 09/14/2020   S/P CABG x 4 08/09/2020   Chest pain, exertional 08/02/2020   Unstable angina (HCC) 08/02/2020   Chronic venous insufficiency 01/04/2017   Acute on chronic diastolic CHF (congestive heart failure) ([place]) 05/10/2016   CAD (coronary artery disease)    CKD (chronic kidney disease) stage 2, GFR 60-89 ml/min    History of tobacco abuse 06/02/2013   Acute myocardial infarction of other inferior wall,  initial episode of care    NSTEMI (non-ST elevated myocardial infarction) ([place]) 06/01/2013   Hyperlipidemia LDL goal <70 05/19/2009   Generalized anxiety disorder 05/19/2009   NICOTINE ADDICTION 05/19/2009   ADD 05/19/2009   Essential hypertension, benign 05/19/2009   UNEQUAL LEG LENGTH 05/19/2009    ONSET DATE: 11/22/2021 (referral date)  REFERRING DIAG: R26.9 (ICD-10-CM) - Gait abnormality   THERAPY DIAG:  Unsteadiness on feet  Dizziness  and giddiness  Other abnormalities of gait and mobility  Rationale for Evaluation and Treatment Rehabilitation  SUBJECTIVE:                                                                                                                                                                                              SUBJECTIVE STATEMENT: Turned in halter monitor but hasn't gotten results back.  Saw Cardiologist today and she feels that he is slightly dehydrated and lowered his fluid pill and increased blood pressure medicine.     Pt accompanied by: self  PERTINENT HISTORY: HTN, NSTEMI, CAD, S/p CABG   PAIN:  Are you having pain? No  PRECAUTIONS: Fall  WEIGHT BEARING RESTRICTIONS No  FALLS: Has patient fallen in last 6 months? Yes. Number of falls 1  LIVING ENVIRONMENT: Lives with: lives alone and lives with their son (son helps out some) Lives in: House/apartment Stairs: No (1 small step into house from garage) Has following equipment at home: Single point cane and shower chair  PLOF: Independent with basic ADLs  PATIENT GOALS "Not feel dizzy, feel steadier on my feet"  OBJECTIVE:   DIAGNOSTIC FINDINGS: MRI was clear of any cerebellar issues, is awaiting results for cardiac monitoring, carotid testing.    TRANSFERS: Assistive device utilized: None  Sit to stand: Modified independence Stand to sit: Complete Independence Chair to chair:  n/a Floor:  n/a   GAIT: Gait pattern: step to pattern, step through pattern, decreased stride length, decreased hip/knee flexion- Right, decreased hip/knee flexion- Left, decreased ankle dorsiflexion- Right, trunk flexed, and wide BOS Distance walked: 19' Assistive device utilized: None Level of assistance: SBA Comments: Only able to assess gait into clinic due to time constraint, however pt with unsteady gait, using wide BOS to stabilize.   FUNCTIONAL TESTs:  5 times sit to stand: 14.91 secs without UE support  Timed up and go  (TUG): 10.15 secs without AD  10 meter walk test: 3.82 ft/sec without device  Functional gait assessment: 20/30    [facility] PT Assessment - 12/05/21 1324       Transfers   Five time sit to stand comments  14.91 secs without UE support  Standardized Balance Assessment   Standardized Balance Assessment Timed Up and Go Test      Timed Up and Go Test   TUG Normal TUG    Normal TUG (seconds) 10.15   without     Functional Gait  Assessment   Gait assessed  Yes    Gait Level Surface Walks 20 ft in Lonnie than 7 sec but greater than 5.5 sec, uses assistive device, slower speed, mild gait deviations, or deviates 6-10 in outside of the 12 in walkway width.   5.84 secs   Change in Gait Speed Able to smoothly change walking speed without loss of balance or gait deviation. Deviate no more than 6 in outside of the 12 in walkway width.    Gait with Horizontal Head Turns Performs head turns smoothly with slight change in gait velocity (eg, minor disruption to smooth gait path), deviates 6-10 in outside 12 in walkway width, or uses an assistive device.    Gait with Vertical Head Turns Performs head turns with no change in gait. Deviates no more than 6 in outside 12 in walkway width.    Gait and Pivot Turn Pivot turns safely within 3 sec and stops quickly with no loss of balance.    Step Over Obstacle Is able to step over one shoe box (4.5 in total height) without changing gait speed. No evidence of imbalance.    Gait with Narrow Base of Support Ambulates Lonnie than 4 steps heel to toe or cannot perform without assistance.    Gait with Eyes Closed Walks 20 ft, slow speed, abnormal gait pattern, evidence for imbalance, deviates 10-15 in outside 12 in walkway width. Requires more than 9 sec to ambulate 20 ft.    Ambulating Backwards Walks 20 ft, uses assistive device, slower speed, mild gait deviations, deviates 6-10 in outside 12 in walkway width.    Steps Alternating feet, must use rail.    Total Score 20     FGA comment: 19-24 = medium risk fall                TODAY'S TREATMENT:  12/05/21: Formal balance and gait assessments, see above.    Initiated HEP based on deficits seen from balance tests.  See details below.  Note he has marked difficulty on compliant surface, narrow BOS and with EC.      PATIENT EDUCATION: Education details: Purpose and results of balance tests and HEP Person educated: Patient Education method: Explanation and Verbal cues Education comprehension: needs further education   HOME EXERCISE PROGRAM: Access Code: [email or web address] URL: [email or web address] Date: 12/05/2021 Prepared by: Cameron Sprang  Exercises - Sit to Stand Without Arm Support  - 1 x daily - 7 x weekly - 2 sets - 10 reps - Tandem Walking with Counter Support  - 1 x daily - 7 x weekly - 2 sets - 4 reps - Wide Stance with Eyes Open  - 1 x daily - 7 x weekly - 1 sets - 3 reps - 20 secs hold - Wide Stance with Head Nods on Foam Pad  - 1 x daily - 7 x weekly - 1 sets - 10 reps - Wide Stance with Head Rotation on Foam Pad  - 1 x daily - 7 x weekly - 1 sets - 10 reps    GOALS: Goals reviewed with patient? Yes  SHORT TERM GOALS: Target date: 01/02/2022  Pt will be IND with initial HEP in order to indicate improved functional mobility and dec fall  risk. Baseline: Goal status: INITIAL  2.  Will improve FGA score to >/=23/30 in order to indicate dec fall risk.  Baseline: 20/30 Goal status: REVISED  3.  Will assess gait speed and update goal to reflect progress.  Baseline: 3.81 ft/sec, will not need LTG as this is [facility].  Goal status: MET  4.  Pt will perform 4 steps with single rail, ambulate x 500' outdoors over paved and grassy surfaces at S level in order to indicate safe community mobility.  Baseline:  Goal status: INITIAL  5.  Will complete vestibular evaluation if needed and add goals.  Baseline: Will defer as he did not have any dizziness during session with balance  challenges and head motion.  Goal status: REVISED (DEFERRED)  6.  Pt will be able to perform last condition of MTCSIB for at least 30 secs in order to indicate improved balance.   Baseline: Not assessed  Goal Status: NEW    LONG TERM GOALS: Target date: 01/30/2022  Pt will be IND with final HEP in order to indicate improved functional mobility and dec fall risk. Baseline:  Goal status: INITIAL  2.  Will score >/=26/30 on FGA in order to indicate dec fall risk.  Baseline: 20/30 Goal status: REVISED  3.  Pt will ambulate x 1000' over unlevel outdoor surfaces (paved and grassy) while scanning environment without overt LOB in order to indicate safe return to community activities.  Baseline:  Goal status: INITIAL  4. Pt will perform standing on foam airex with feet together EC for at least 30 secs with minimal sway in order to indicate improved balance.   Baseline:   Goal Status: NEW    ASSESSMENT:  CLINICAL IMPRESSION:   Skilled session focused on formal assessments of balance and gait.  BP remained stable throughout (128/65 prior to session, 151/83 upon standing, 140/68 following balance tasks).  Note most measures WFL, however when given balance challenges, such as FGA score indicates medium fall risk.  Also note he has marked difficulty on compliant surfaces and conditions where eyes are closed.  Will continue to challenge vestibular system in future sessions.      OBJECTIVE IMPAIRMENTS Abnormal gait, cardiopulmonary status limiting activity, decreased balance, decreased mobility, and dizziness.   ACTIVITY LIMITATIONS standing, squatting, stairs, bathing, and locomotion level  PARTICIPATION LIMITATIONS: driving, community activity, yard work, and church  PERSONAL FACTORS Past/current experiences and 3+ comorbidities: see above  are also affecting patient's functional outcome.   REHAB POTENTIAL: Good  CLINICAL DECISION MAKING: Evolving/moderate complexity  EVALUATION  COMPLEXITY: Moderate  PLAN: PT FREQUENCY: 2x/week  PT DURATION: 8 weeks  PLANNED INTERVENTIONS: Therapeutic exercises, Therapeutic activity, Neuromuscular re-education, Balance training, Gait training, Patient/Family education, Stair training, Vestibular training, DME instructions, and Aquatic Therapy  PLAN FOR NEXT SESSION: High level balance, esp compliant surfaces, narrow BOS, EC and head motions.  Dynamic balance and gait activities.  Can continue to monitor BP but was stable during second session.  Add to HEP as able.    Cameron Sprang, PT, MPT [facility] [address] [place], [place], [address] Phone: [phone]   Fax:  [phone] 12/05/21, 1:09 PM

## 2021-12-05 NOTE — Patient Instructions (Signed)
Medication Instructions:   DECREASE YOUR LASIX TO 20 MG BY MOUTH DAILY  *If you need a refill on your cardiac medications before your next appointment, please call your pharmacy*   Lab Work:  TODAY--LIPIDS  If you have labs (blood work) drawn today and your tests are completely normal, you will receive your results only by: [place] (if you have MyChart) OR A paper copy in the mail If you have any lab test that is abnormal or we need to change your treatment, we will call you to review the results.   Follow-Up:  IN SEPT ON 9/25 AS SCHEDULED WITH DR. [name]    Important Information About Sugar

## 2021-12-06 ENCOUNTER — Telehealth: Payer: Self-pay | Admitting: Cardiology

## 2021-12-06 LAB — LIPID PANEL
Chol/HDL Ratio: 3.8 ratio (ref 0.0–5.0)
Cholesterol, Total: 150 mg/dL (ref 100–199)
HDL: 39 mg/dL — ABNORMAL LOW (ref >39)
LDL Chol Calc (NIH): 87 mg/dL (ref 0–99)
Triglycerides: 138 mg/dL (ref 0–149)
VLDL Cholesterol Cal: 24 mg/dL (ref 5–40)

## 2021-12-06 NOTE — Telephone Encounter (Signed)
Nuala Alpha, LPN  2/37/6283  1:51 PM EDT Back to Top    The patient has been notified of the result and verbalized understanding.  All questions (if any) were answered.   Pt states he has not been taking his atorvastatin daily.  Advised the pt that he needs to take his atorvastatin 80 mg po every evening, and we will recheck his lipids when he comes into the office to see Dr. [name] in Sept.  Pt education provided on the importance of taking his atorvastatin daily, and what this prevents.  Pt states he has plenty refills of this medication.  He reports he will start taking this every evening and will see [place] as planned in September.  Pt verbalized understanding and agrees with this plan. Will send this information back to Dr. [name] as an Juluis Rainier.    Nuala Alpha, LPN  7/61/6073  7:10 PM EDT     Left message for the pt to call back for results.   Freada Bergeron, MD  12/06/2021  3:58 PM EDT     His LDL went up. Has he been taking his lipitor? If so, can we start him on zetia '10mg'$  daily and repeat lipids at his follow-up visit in September

## 2021-12-06 NOTE — Telephone Encounter (Signed)
Pt returning a call about lab results.

## 2021-12-08 ENCOUNTER — Ambulatory Visit: Payer: Medicare Other | Admitting: Physical Therapy

## 2021-12-08 ENCOUNTER — Encounter: Payer: Self-pay | Admitting: Physical Therapy

## 2021-12-08 ENCOUNTER — Telehealth: Payer: Self-pay | Admitting: *Deleted

## 2021-12-08 DIAGNOSIS — R2689 Other abnormalities of gait and mobility: Secondary | ICD-10-CM | POA: Diagnosis not present

## 2021-12-08 DIAGNOSIS — R2681 Unsteadiness on feet: Secondary | ICD-10-CM

## 2021-12-08 DIAGNOSIS — R42 Dizziness and giddiness: Secondary | ICD-10-CM

## 2021-12-08 DIAGNOSIS — R55 Syncope and collapse: Secondary | ICD-10-CM | POA: Diagnosis not present

## 2021-12-08 DIAGNOSIS — R269 Unspecified abnormalities of gait and mobility: Secondary | ICD-10-CM | POA: Diagnosis not present

## 2021-12-08 MED ORDER — CARVEDILOL 6.25 MG PO TABS: 9.7500 mg | ORAL_TABLET | Freq: Two times a day (BID) | ORAL | 3 refills | Status: DC

## 2021-12-08 NOTE — Therapy (Signed)
OUTPATIENT PHYSICAL THERAPY NEURO TREATMENT   Patient Name: Lonnie Skinner MRN: [id number] DOB:09/01/1960, 61 y.o., male Today's Date: 12/08/2021   PCP: Alen Bleacher, MD REFERRING PROVIDER: Dorris Singh, MD   PT End of Session - 12/08/21 1232     Visit Number 3    Number of Visits 17    Date for PT Re-Evaluation 01/27/22    Authorization Type UHC Medicare (needs 10th visit PN)    Progress Note Due on Visit 10    PT Start Time 16    PT Stop Time 1314    PT Time Calculation (min) 44 min    Activity Tolerance Patient tolerated treatment well    Behavior During Therapy [facility] for tasks assessed/performed             Past Medical History:  Diagnosis Date   CAD (coronary artery disease)    a. 05/2013: NSTEMI s/p DES to mRCA (culprit vessel), residual moderate LAD disease (the patient fails medical therapy and this was found to be significant, this would be amenable to PCI).   CHF (congestive heart failure) (HCC)    CKD (chronic kidney disease), stage II    DVT (deep venous thrombosis) ([place]) 1981   Hypercholesteremia    Hyperglycemia    Hypertension    Past Surgical History:  Procedure Laterality Date   CORONARY ARTERY BYPASS GRAFT N/A 08/09/2020   Procedure: CORONARY ARTERY BYPASS GRAFTING (CABG) X  FOUR   USING  LEFT INTERNAL MAMMARY ARTERY HARVEST AND RIGHT AND LEFT ENDOSCOPIC SAPHENOUS VEIN HARVEST;  Surgeon: [name], MD;  Location: [place];  Service: Open Heart Surgery;  Laterality: N/A;   CORONARY STENT PLACEMENT  05/2013   mRCA   CORONARY/GRAFT ACUTE MI REVASCULARIZATION  2015   INTRAVASCULAR PRESSURE WIRE/FFR STUDY N/A 08/02/2020   Procedure: INTRAVASCULAR PRESSURE WIRE/FFR STUDY;  Surgeon: [name], [name], MD;  Location: [place] CV LAB;  Service: Cardiovascular;  Laterality: N/A;   KNEE SURGERY Left 2009   LEFT HEART CATH AND CORONARY ANGIOGRAPHY N/A 08/02/2020   Procedure: LEFT HEART CATH AND CORONARY ANGIOGRAPHY;  Surgeon: [name], [name], MD;   Location: [place] CV LAB;  Service: Cardiovascular;  Laterality: N/A;   LEFT HEART CATHETERIZATION WITH CORONARY ANGIOGRAM N/A 06/02/2013   Procedure: LEFT HEART CATHETERIZATION WITH CORONARY ANGIOGRAM;  Surgeon: [name], MD;  Location: [facility] CATH LAB;  Service: Cardiovascular;  Laterality: N/A;   PERCUTANEOUS CORONARY STENT INTERVENTION (PCI-S)  06/02/2013   Procedure: PERCUTANEOUS CORONARY STENT INTERVENTION (PCI-S);  Surgeon: [name], MD;  Location: [facility] CATH LAB;  Service: Cardiovascular;;   TEE WITHOUT CARDIOVERSION N/A 08/09/2020   Procedure: TRANSESOPHAGEAL ECHOCARDIOGRAM (TEE);  Surgeon: [name], MD;  Location: [place];  Service: Open Heart Surgery;  Laterality: N/A;   Patient Active Problem List   Diagnosis Date Noted   Dizziness 11/03/2021   Ataxia 11/03/2021   Cough in adult patient 04/17/2021   Increased urinary frequency 04/17/2021   Well adult health check 04/05/2021   Knee pain 09/20/2020   Long term (current) use of anticoagulants 09/20/2020   Anxiety 09/20/2020   Gait abnormality 09/20/2020   Hypertension 09/20/2020   Hypertensive retinopathy, bilateral 09/20/2020   Specific muscle disorder 09/20/2020   Concern about eye disease without diagnosis 09/14/2020   S/P CABG x 4 08/09/2020   Chest pain, exertional 08/02/2020   Unstable angina ([place]) 08/02/2020   Chronic venous insufficiency 01/04/2017   Acute on chronic diastolic CHF (congestive heart failure) ([place]) 05/10/2016  CAD (coronary artery disease)    CKD (chronic kidney disease) stage 2, GFR 60-89 ml/min    History of tobacco abuse 06/02/2013   Acute myocardial infarction of other inferior wall, initial episode of care    NSTEMI (non-ST elevated myocardial infarction) ([place]) 06/01/2013   Hyperlipidemia LDL goal <70 05/19/2009   Generalized anxiety disorder 05/19/2009   NICOTINE ADDICTION 05/19/2009   ADD 05/19/2009   Essential hypertension, benign 05/19/2009   UNEQUAL LEG LENGTH  05/19/2009    ONSET DATE: 11/22/2021 (referral date)  REFERRING DIAG: R26.9 (ICD-10-CM) - Gait abnormality   THERAPY DIAG:  Unsteadiness on feet  Dizziness and giddiness  Other abnormalities of gait and mobility  Rationale for Evaluation and Treatment Rehabilitation  PRECAUTIONS: Fall  PERTINENT HISTORY: HTN, NSTEMI, CAD, S/p CABG   PATIENT GOALS "Not feel dizzy, feel steadier on my feet"  SUBJECTIVE: Saw his cardiologist this week. Had multiple tests and turns out he was on too much fluid pills. His lasix was decreased from 40 mg's to 20 mg's. No falls. Did do the HEP and it was challenging.                                                                                                                                                                                             PAIN:  Are you having pain? No   VITALS: BP- 142/87 HR 65  before session    TODAY'S TREATMENT:  BALANCE/NMR: Gait around track: 230 feet working on speed changes, scanning all directions randomly with min guard assist. Then several laps with pt self tossing ball while naming boys names A-M with min guard to min assist with cues to task/names  Rockerboard: both ways on the balance board with no UE support, occasional touch to bars- rocking the board with EO with up to min assist, cues on posture/weight shifting. Then holding the board steady for alternating UE raises, progressing to bil UE raises for 5-8 reps each. Then holding the board steady for EC 30 seconds  3 reps. All with up to min assist for balance.   BP after balance board activities- 139/89, HR 69. Side Stepping: on blue foam beam for 3 lap each way left<>right with light support on bars, cues on step length/height,and to slow down CGA for safety Tandem Walking: on blue foam beam for 3 laps each forward/backwards with light support on bars, cues for step placement and to slow down for improved balance. CGA for safety.         PATIENT  EDUCATION: Education details: continue with current HEP Person educated: Patient Education method: Explanation and Verbal cues  Education comprehension: needs further education    HOME EXERCISE PROGRAM: Access Code: [email or web address] URL: [email or web address] Date: 12/05/2021 Prepared by: Cameron Sprang  Exercises - Sit to Stand Without Arm Support  - 1 x daily - 7 x weekly - 2 sets - 10 reps - Tandem Walking with Counter Support  - 1 x daily - 7 x weekly - 2 sets - 4 reps - Wide Stance with Eyes Open  - 1 x daily - 7 x weekly - 1 sets - 3 reps - 20 secs hold - Wide Stance with Head Nods on Foam Pad  - 1 x daily - 7 x weekly - 1 sets - 10 reps - Wide Stance with Head Rotation on Foam Pad  - 1 x daily - 7 x weekly - 1 sets - 10 reps    GOALS: Goals reviewed with patient? Yes  SHORT TERM GOALS: Target date: 12/26/2021  Pt will be IND with initial HEP in order to indicate improved functional mobility and dec fall risk. Baseline: Goal status: INITIAL  2.  Will improve FGA score to >/=23/30 in order to indicate dec fall risk.  Baseline: 20/30 Goal status: REVISED  3.  Will assess gait speed and update goal to reflect progress.  Baseline: 3.81 ft/sec, will not need LTG as this is [facility].  Goal status: MET  4.  Pt will perform 4 steps with single rail, ambulate x 500' outdoors over paved and grassy surfaces at S level in order to indicate safe community mobility.  Baseline:  Goal status: INITIAL  5.  Will complete vestibular evaluation if needed and add goals.  Baseline: Will defer as he did not have any dizziness during session with balance challenges and head motion.  Goal status:  (DEFERRED)  6.  Pt will be able to perform last condition of MTCSIB for at least 30 secs in order to indicate improved balance.   Baseline: Not assessed  Goal Status: NEW    LONG TERM GOALS: Target date: 01/23/2022  Pt will be IND with final HEP in order to indicate improved functional  mobility and dec fall risk. Baseline:  Goal status: INITIAL  2.  Will score >/=26/30 on FGA in order to indicate dec fall risk.  Baseline: 20/30 Goal status: REVISED  3.  Pt will ambulate x 1000' over unlevel outdoor surfaces (paved and grassy) while scanning environment without overt LOB in order to indicate safe return to community activities.  Baseline:  Goal status: INITIAL  4. Pt will perform standing on foam airex with feet together EC for at least 30 secs with minimal sway in order to indicate improved balance.   Baseline:   Goal Status: NEW    ASSESSMENT:  CLINICAL IMPRESSION:  Today's skilled session continued to focus on dynamic gait and high level balance activities with no issues noted or reported in session. BP remained WFL. The pt is progressing and should benefit from continued PT to progress toward unmet goals.    OBJECTIVE IMPAIRMENTS Abnormal gait, cardiopulmonary status limiting activity, decreased balance, decreased mobility, and dizziness.   ACTIVITY LIMITATIONS standing, squatting, stairs, bathing, and locomotion level  PARTICIPATION LIMITATIONS: driving, community activity, yard work, and church  PERSONAL FACTORS Past/current experiences and 3+ comorbidities: see above  are also affecting patient's functional outcome.   REHAB POTENTIAL: Good  CLINICAL DECISION MAKING: Evolving/moderate complexity  EVALUATION COMPLEXITY: Moderate  PLAN: PT FREQUENCY: 2x/week  PT DURATION: 8 weeks  PLANNED INTERVENTIONS: Therapeutic exercises, Therapeutic activity, Neuromuscular re-education, Balance training,  Gait training, Patient/Family education, Stair training, Vestibular training, DME instructions, and Aquatic Therapy  PLAN FOR NEXT SESSION:  High level balance, esp compliant surfaces, narrow BOS, EC and head motions.  Dynamic balance and gait activities.  Can continue to monitor BP, has been stable with repeat sessions. Add to HEP as able.    Willow Ora,  PTA, [place] [address], [place], [place] [address] [phone] 12/08/21, 3:57 PM

## 2021-12-08 NOTE — Telephone Encounter (Signed)
-----   Message from Liliane Shi, [place] sent at 12/08/2021  1:20 PM EDT ----- Monitor shows frequent PVCs (6.4%).  There were a few runs of supraventricular tachycardia.  However, these were brief and the longest was only 9 beats.  He did not have sustained arrhythmias or significant pauses.  Symptoms were associated with PVCs. PLAN:  -Increase carvedilol to 9.375 mg twice daily to help reduce PVC burden Richardson Dopp, PA-C    12/08/2021 1:15 PM

## 2021-12-12 ENCOUNTER — Ambulatory Visit: Payer: Medicare Other | Admitting: Rehabilitation

## 2021-12-12 ENCOUNTER — Encounter: Payer: Self-pay | Admitting: Rehabilitation

## 2021-12-12 DIAGNOSIS — R269 Unspecified abnormalities of gait and mobility: Secondary | ICD-10-CM | POA: Diagnosis not present

## 2021-12-12 DIAGNOSIS — R2681 Unsteadiness on feet: Secondary | ICD-10-CM | POA: Diagnosis not present

## 2021-12-12 DIAGNOSIS — R2689 Other abnormalities of gait and mobility: Secondary | ICD-10-CM

## 2021-12-12 DIAGNOSIS — R42 Dizziness and giddiness: Secondary | ICD-10-CM | POA: Diagnosis not present

## 2021-12-12 NOTE — Therapy (Signed)
OUTPATIENT PHYSICAL THERAPY NEURO TREATMENT   Patient Name: Lonnie Skinner MRN: [id number] DOB:09-Jun-1960, 61 y.o., male Today's Date: 12/12/2021   PCP: Alen Bleacher, MD REFERRING PROVIDER: Dorris Singh, MD   PT End of Session - 12/12/21 0935     Visit Number 4    Number of Visits 17    Date for PT Re-Evaluation 01/27/22    Authorization Type UHC Medicare (needs 10th visit PN)    Progress Note Due on Visit 10    PT Start Time 0931    PT Stop Time 1015    PT Time Calculation (min) 44 min    Activity Tolerance Patient tolerated treatment well    Behavior During Therapy [facility] for tasks assessed/performed             Past Medical History:  Diagnosis Date   CAD (coronary artery disease)    a. 05/2013: NSTEMI s/p DES to mRCA (culprit vessel), residual moderate LAD disease (the patient fails medical therapy and this was found to be significant, this would be amenable to PCI).   CHF (congestive heart failure) (HCC)    CKD (chronic kidney disease), stage II    DVT (deep venous thrombosis) ([place]) 1981   Hypercholesteremia    Hyperglycemia    Hypertension    Past Surgical History:  Procedure Laterality Date   CORONARY ARTERY BYPASS GRAFT N/A 08/09/2020   Procedure: CORONARY ARTERY BYPASS GRAFTING (CABG) X  FOUR   USING  LEFT INTERNAL MAMMARY ARTERY HARVEST AND RIGHT AND LEFT ENDOSCOPIC SAPHENOUS VEIN HARVEST;  Surgeon: [name], MD;  Location: [place];  Service: Open Heart Surgery;  Laterality: N/A;   CORONARY STENT PLACEMENT  05/2013   mRCA   CORONARY/GRAFT ACUTE MI REVASCULARIZATION  2015   INTRAVASCULAR PRESSURE WIRE/FFR STUDY N/A 08/02/2020   Procedure: INTRAVASCULAR PRESSURE WIRE/FFR STUDY;  Surgeon: [name], [name], MD;  Location: [place] CV LAB;  Service: Cardiovascular;  Laterality: N/A;   KNEE SURGERY Left 2009   LEFT HEART CATH AND CORONARY ANGIOGRAPHY N/A 08/02/2020   Procedure: LEFT HEART CATH AND CORONARY ANGIOGRAPHY;  Surgeon: [name], [name], MD;   Location: [place] CV LAB;  Service: Cardiovascular;  Laterality: N/A;   LEFT HEART CATHETERIZATION WITH CORONARY ANGIOGRAM N/A 06/02/2013   Procedure: LEFT HEART CATHETERIZATION WITH CORONARY ANGIOGRAM;  Surgeon: [name], MD;  Location: [facility] CATH LAB;  Service: Cardiovascular;  Laterality: N/A;   PERCUTANEOUS CORONARY STENT INTERVENTION (PCI-S)  06/02/2013   Procedure: PERCUTANEOUS CORONARY STENT INTERVENTION (PCI-S);  Surgeon: [name], MD;  Location: [facility] CATH LAB;  Service: Cardiovascular;;   TEE WITHOUT CARDIOVERSION N/A 08/09/2020   Procedure: TRANSESOPHAGEAL ECHOCARDIOGRAM (TEE);  Surgeon: [name], MD;  Location: [place];  Service: Open Heart Surgery;  Laterality: N/A;   Patient Active Problem List   Diagnosis Date Noted   Dizziness 11/03/2021   Ataxia 11/03/2021   Cough in adult patient 04/17/2021   Increased urinary frequency 04/17/2021   Well adult health check 04/05/2021   Knee pain 09/20/2020   Long term (current) use of anticoagulants 09/20/2020   Anxiety 09/20/2020   Gait abnormality 09/20/2020   Hypertension 09/20/2020   Hypertensive retinopathy, bilateral 09/20/2020   Specific muscle disorder 09/20/2020   Concern about eye disease without diagnosis 09/14/2020   S/P CABG x 4 08/09/2020   Chest pain, exertional 08/02/2020   Unstable angina ([place]) 08/02/2020   Chronic venous insufficiency 01/04/2017   Acute on chronic diastolic CHF (congestive heart failure) ([place]) 05/10/2016  CAD (coronary artery disease)    CKD (chronic kidney disease) stage 2, GFR 60-89 ml/min    History of tobacco abuse 06/02/2013   Acute myocardial infarction of other inferior wall, initial episode of care    NSTEMI (non-ST elevated myocardial infarction) ([place]) 06/01/2013   Hyperlipidemia LDL goal <70 05/19/2009   Generalized anxiety disorder 05/19/2009   NICOTINE ADDICTION 05/19/2009   ADD 05/19/2009   Essential hypertension, benign 05/19/2009   UNEQUAL LEG LENGTH  05/19/2009    ONSET DATE: 11/22/2021 (referral date)  REFERRING DIAG: R26.9 (ICD-10-CM) - Gait abnormality   THERAPY DIAG:  Unsteadiness on feet  Other abnormalities of gait and mobility  Rationale for Evaluation and Treatment Rehabilitation  PRECAUTIONS: Fall  PERTINENT HISTORY: HTN, NSTEMI, CAD, S/p CABG   PATIENT GOALS "Not feel dizzy, feel steadier on my feet"  SUBJECTIVE: Reports not doing exercises everyday, only did them "at first."                                                                                                                                                                                             PAIN:  Are you having pain? No   VITALS: BP- 147/92 HR 102  before session, remained stable throughout session.     TODAY'S TREATMENT:  BALANCE/NMR:   In // bars for multi sensory balance challenges; attempted to have pt stand on BOSU (black top up) however he was too nervous to fully stand on this, so went back to small rocker board that was done in previous session.  Standing with feet slightly apart, worked several reps of having pt try to decrease UE support to no UE support, however he is only able to let go for a few seconds at a time and relies on UEs to correct loss of balance.  Then worked on limits of stability having pt rock board ant/post, again for several reps decreasing UE support from BUE >single UE.  Again, he is unable to perform without UE support and has increased anxiety with all balance tasks.  PT had pt perform on solid ground rocking to heels and lifting toes, then lifting heels x 20 reps decreasing UE support and he did well with this so went back to rocker board.  PT educated on importance of balance challenges and how this will translate to safer community mobility, esp if he plans to return to dancing which he did prior to a few months ago. Transitioned to standing on foam airex with feet shoulder width apart EO with several attempts at  using no UE support.  Pt is able to maintain balance without UE  support for 15 secs at a time.  We attempted to perform 4th condition of MCTSIB for STG/LTG.  He is able to hold longer than 5 secs.    Pt needing several standing rest breaks due to anxiety with tasks.      Therex:   Seated scifit stepper x 6 mins at level 5 resistance with BUEs/LEs for generalized strengthening and endurance.  BP following stepper was 148/76       PATIENT EDUCATION: Education details: continue with current HEP Person educated: Patient Education method: Explanation and Verbal cues Education comprehension: needs further education    HOME EXERCISE PROGRAM: Access Code: [email or web address] URL: [email or web address] Date: 12/05/2021 Prepared by: Cameron Sprang  Exercises - Sit to Stand Without Arm Support  - 1 x daily - 7 x weekly - 2 sets - 10 reps - Tandem Walking with Counter Support  - 1 x daily - 7 x weekly - 2 sets - 4 reps - Wide Stance with Eyes Open  - 1 x daily - 7 x weekly - 1 sets - 3 reps - 20 secs hold - Wide Stance with Head Nods on Foam Pad  - 1 x daily - 7 x weekly - 1 sets - 10 reps - Wide Stance with Head Rotation on Foam Pad  - 1 x daily - 7 x weekly - 1 sets - 10 reps    GOALS: Goals reviewed with patient? Yes  SHORT TERM GOALS: Target date: 12/26/2021  Pt will be IND with initial HEP in order to indicate improved functional mobility and dec fall risk. Baseline: Goal status: INITIAL  2.  Will improve FGA score to >/=23/30 in order to indicate dec fall risk.  Baseline: 20/30 Goal status: REVISED  3.  Will assess gait speed and update goal to reflect progress.  Baseline: 3.81 ft/sec, will not need LTG as this is [facility].  Goal status: MET  4.  Pt will perform 4 steps with single rail, ambulate x 500' outdoors over paved and grassy surfaces at S level in order to indicate safe community mobility.  Baseline:  Goal status: INITIAL  5.  Will complete vestibular  evaluation if needed and add goals.  Baseline: Will defer as he did not have any dizziness during session with balance challenges and head motion.  Goal status:  (DEFERRED)  6.  Pt will be able to perform last condition of MTCSIB for at least 30 secs in order to indicate improved balance.   Baseline: Not assessed  Goal Status: NEW    LONG TERM GOALS: Target date: 01/23/2022  Pt will be IND with final HEP in order to indicate improved functional mobility and dec fall risk. Baseline:  Goal status: INITIAL  2.  Will score >/=26/30 on FGA in order to indicate dec fall risk.  Baseline: 20/30 Goal status: REVISED  3.  Pt will ambulate x 1000' over unlevel outdoor surfaces (paved and grassy) while scanning environment without overt LOB in order to indicate safe return to community activities.  Baseline:  Goal status: INITIAL  4. Pt will perform standing on foam airex with feet together EC for at least 30 secs with minimal sway in order to indicate improved balance.   Baseline:   Goal Status: NEW    ASSESSMENT:  CLINICAL IMPRESSION:  Today's skilled session continued to focus on dynamic gait and high level balance activities with no issues noted or reported in session. BP remained WFL. The pt is progressing and should benefit from continued PT  to progress toward unmet goals.    OBJECTIVE IMPAIRMENTS Abnormal gait, cardiopulmonary status limiting activity, decreased balance, decreased mobility, and dizziness.   ACTIVITY LIMITATIONS standing, squatting, stairs, bathing, and locomotion level  PARTICIPATION LIMITATIONS: driving, community activity, yard work, and church  PERSONAL FACTORS Past/current experiences and 3+ comorbidities: see above  are also affecting patient's functional outcome.   REHAB POTENTIAL: Good  CLINICAL DECISION MAKING: Evolving/moderate complexity  EVALUATION COMPLEXITY: Moderate  PLAN: PT FREQUENCY: 2x/week  PT DURATION: 8 weeks  PLANNED  INTERVENTIONS: Therapeutic exercises, Therapeutic activity, Neuromuscular re-education, Balance training, Gait training, Patient/Family education, Stair training, Vestibular training, DME instructions, and Aquatic Therapy  PLAN FOR NEXT SESSION:  Keep instruction simple! High level balance, esp compliant surfaces, narrow BOS, EC and head motions.  Dynamic balance and gait activities.  Can continue to monitor BP, has been stable with repeat sessions. Add to HEP as able.    Cameron Sprang, PT, MPT [facility] [address] [place], [place], [address] Phone: [phone]   Fax:  [phone] 12/12/21, 10:38 AM

## 2021-12-14 ENCOUNTER — Ambulatory Visit: Payer: Medicare Other | Admitting: Physical Therapy

## 2021-12-14 VITALS — BP 130/80 | HR 77

## 2021-12-14 DIAGNOSIS — R269 Unspecified abnormalities of gait and mobility: Secondary | ICD-10-CM | POA: Diagnosis not present

## 2021-12-14 DIAGNOSIS — R2689 Other abnormalities of gait and mobility: Secondary | ICD-10-CM

## 2021-12-14 DIAGNOSIS — R2681 Unsteadiness on feet: Secondary | ICD-10-CM | POA: Diagnosis not present

## 2021-12-14 DIAGNOSIS — R42 Dizziness and giddiness: Secondary | ICD-10-CM | POA: Diagnosis not present

## 2021-12-14 NOTE — Therapy (Signed)
OUTPATIENT PHYSICAL THERAPY NEURO TREATMENT   Patient Name: Lonnie Skinner MRN: [id number] DOB:1960-12-14, 61 y.o., male Today's Date: 12/14/2021   PCP: Alen Bleacher, MD REFERRING PROVIDER: Dorris Singh, MD   PT End of Session - 12/14/21 0930     Visit Number 5    Number of Visits 17    Date for PT Re-Evaluation 01/27/22    Authorization Type UHC Medicare (needs 10th visit PN)    Progress Note Due on Visit 10    PT Start Time 0929    PT Stop Time 1012    PT Time Calculation (min) 43 min    Activity Tolerance Patient tolerated treatment well    Behavior During Therapy [facility] for tasks assessed/performed;Anxious              Past Medical History:  Diagnosis Date   CAD (coronary artery disease)    a. 05/2013: NSTEMI s/p DES to mRCA (culprit vessel), residual moderate LAD disease (the patient fails medical therapy and this was found to be significant, this would be amenable to PCI).   CHF (congestive heart failure) (HCC)    CKD (chronic kidney disease), stage II    DVT (deep venous thrombosis) ([place]) 1981   Hypercholesteremia    Hyperglycemia    Hypertension    Past Surgical History:  Procedure Laterality Date   CORONARY ARTERY BYPASS GRAFT N/A 08/09/2020   Procedure: CORONARY ARTERY BYPASS GRAFTING (CABG) X  FOUR   USING  LEFT INTERNAL MAMMARY ARTERY HARVEST AND RIGHT AND LEFT ENDOSCOPIC SAPHENOUS VEIN HARVEST;  Surgeon: [name], MD;  Location: [place];  Service: Open Heart Surgery;  Laterality: N/A;   CORONARY STENT PLACEMENT  05/2013   mRCA   CORONARY/GRAFT ACUTE MI REVASCULARIZATION  2015   INTRAVASCULAR PRESSURE WIRE/FFR STUDY N/A 08/02/2020   Procedure: INTRAVASCULAR PRESSURE WIRE/FFR STUDY;  Surgeon: [name], [name], MD;  Location: [place] CV LAB;  Service: Cardiovascular;  Laterality: N/A;   KNEE SURGERY Left 2009   LEFT HEART CATH AND CORONARY ANGIOGRAPHY N/A 08/02/2020   Procedure: LEFT HEART CATH AND CORONARY ANGIOGRAPHY;  Surgeon: [name], [name],  MD;  Location: [place] CV LAB;  Service: Cardiovascular;  Laterality: N/A;   LEFT HEART CATHETERIZATION WITH CORONARY ANGIOGRAM N/A 06/02/2013   Procedure: LEFT HEART CATHETERIZATION WITH CORONARY ANGIOGRAM;  Surgeon: [name], MD;  Location: [facility] CATH LAB;  Service: Cardiovascular;  Laterality: N/A;   PERCUTANEOUS CORONARY STENT INTERVENTION (PCI-S)  06/02/2013   Procedure: PERCUTANEOUS CORONARY STENT INTERVENTION (PCI-S);  Surgeon: [name], MD;  Location: [facility] CATH LAB;  Service: Cardiovascular;;   TEE WITHOUT CARDIOVERSION N/A 08/09/2020   Procedure: TRANSESOPHAGEAL ECHOCARDIOGRAM (TEE);  Surgeon: [name], MD;  Location: [place];  Service: Open Heart Surgery;  Laterality: N/A;   Patient Active Problem List   Diagnosis Date Noted   Dizziness 11/03/2021   Ataxia 11/03/2021   Cough in adult patient 04/17/2021   Increased urinary frequency 04/17/2021   Well adult health check 04/05/2021   Knee pain 09/20/2020   Long term (current) use of anticoagulants 09/20/2020   Anxiety 09/20/2020   Gait abnormality 09/20/2020   Hypertension 09/20/2020   Hypertensive retinopathy, bilateral 09/20/2020   Specific muscle disorder 09/20/2020   Concern about eye disease without diagnosis 09/14/2020   S/P CABG x 4 08/09/2020   Chest pain, exertional 08/02/2020   Unstable angina (HCC) 08/02/2020   Chronic venous insufficiency 01/04/2017   Acute on chronic diastolic CHF (congestive heart failure) ([place])  05/10/2016   CAD (coronary artery disease)    CKD (chronic kidney disease) stage 2, GFR 60-89 ml/min    History of tobacco abuse 06/02/2013   Acute myocardial infarction of other inferior wall, initial episode of care    NSTEMI (non-ST elevated myocardial infarction) ([place]) 06/01/2013   Hyperlipidemia LDL goal <70 05/19/2009   Generalized anxiety disorder 05/19/2009   NICOTINE ADDICTION 05/19/2009   ADD 05/19/2009   Essential hypertension, benign 05/19/2009   UNEQUAL LEG  LENGTH 05/19/2009    ONSET DATE: 11/22/2021 (referral date)  REFERRING DIAG: R26.9 (ICD-10-CM) - Gait abnormality   THERAPY DIAG:  Unsteadiness on feet  Other abnormalities of gait and mobility  Rationale for Evaluation and Treatment Rehabilitation  PRECAUTIONS: Fall  PERTINENT HISTORY: HTN, NSTEMI, CAD, S/p CABG   PATIENT GOALS "Not feel dizzy, feel steadier on my feet"  SUBJECTIVE: "I am alright I reckon". Reports he has been trying to do the exercises but they are "alright".                                                                                                                                                                     PAIN:  Are you having pain? No   VITALS  Vitals:   12/14/21 0934 12/14/21 1010  BP: 121/71 130/80  Pulse: (!) 51 77       TODAY'S TREATMENT: NMR  In // bars for improved reactive balance strategies, narrow BOS, single leg stability and vestibular input:  -Standing on airex without UE support and narrow BOS and EO, pt able to stand for 15-25s at a time while maintaining balance. Min cues for pt to pick a visual target to maintain gaze on to assist w/balance, as pt did not enjoy "looking at that weird dude in the mirror" for visual biofeedback. Pt required mod cues to sustain attention to task throughout and to reduce reliance on BUEs, as he tends to lightly graze rails w/hands. Progressed to standing w/EC and pt required max encouraging cues to perform. Educated pt on importance of ankle strategy and allowing ankles to move fluidly to adapt to changes in terrain, as staying rigid will make balance worse. Pt verbalized slight understanding. Pt dancing throughout session to "get the blood flowing". Pt able to hold balance w/EC for 2-5s at a time w/heavy reliance on touching rails, "I feel like I am having one of those panicky things". Provided cues for diaphragmatic breathing and encouragement as appropriate.  -On rockerboard in A/P direction,  practiced shifting anteriorly and posteriorly without UE support. "Oh no, not that thing". Pt able to shift in A/P direction without UE support using mirror for visual biofeedback for 2x60s. Progressed to alt cone taps from rockerboard, "oh no not that thing, shoot  dangit. I cannot believe I cannot hold on". Max cues to reduce reliance on BUEs and progressed from BUE > unilateral UE support, x5 per side slowly w/max redirectional cues to sustain attention to task.   Checked BP at end of session per pt request, see above for details.      PATIENT EDUCATION: Education details: continue with current HEP Person educated: Patient Education method: Explanation and Verbal cues Education comprehension: needs further education    HOME EXERCISE PROGRAM: Access Code: [email or web address] URL: [email or web address] Date: 12/05/2021 Prepared by: Cameron Sprang  Exercises - Sit to Stand Without Arm Support  - 1 x daily - 7 x weekly - 2 sets - 10 reps - Tandem Walking with Counter Support  - 1 x daily - 7 x weekly - 2 sets - 4 reps - Wide Stance with Eyes Open  - 1 x daily - 7 x weekly - 1 sets - 3 reps - 20 secs hold - Wide Stance with Head Nods on Foam Pad  - 1 x daily - 7 x weekly - 1 sets - 10 reps - Wide Stance with Head Rotation on Foam Pad  - 1 x daily - 7 x weekly - 1 sets - 10 reps    GOALS: Goals reviewed with patient? Yes  SHORT TERM GOALS: Target date: 12/26/2021  Pt will be IND with initial HEP in order to indicate improved functional mobility and dec fall risk. Baseline: Goal status: INITIAL  2.  Will improve FGA score to >/=23/30 in order to indicate dec fall risk.  Baseline: 20/30 Goal status: REVISED  3.  Will assess gait speed and update goal to reflect progress.  Baseline: 3.81 ft/sec, will not need LTG as this is [facility].  Goal status: MET  4.  Pt will perform 4 steps with single rail, ambulate x 500' outdoors over paved and grassy surfaces at S level in order to  indicate safe community mobility.  Baseline:  Goal status: INITIAL  5.  Will complete vestibular evaluation if needed and add goals.  Baseline: Will defer as he did not have any dizziness during session with balance challenges and head motion.  Goal status:  (DEFERRED)  6.  Pt will be able to perform last condition of MTCSIB for at least 30 secs in order to indicate improved balance.   Baseline: Not assessed  Goal Status: NEW    LONG TERM GOALS: Target date: 01/23/2022  Pt will be IND with final HEP in order to indicate improved functional mobility and dec fall risk. Baseline:  Goal status: INITIAL  2.  Will score >/=26/30 on FGA in order to indicate dec fall risk.  Baseline: 20/30 Goal status: REVISED  3.  Pt will ambulate x 1000' over unlevel outdoor surfaces (paved and grassy) while scanning environment without overt LOB in order to indicate safe return to community activities.  Baseline:  Goal status: INITIAL  4. Pt will perform standing on foam airex with feet together EC for at least 30 secs with minimal sway in order to indicate improved balance.   Baseline:   Goal Status: NEW    ASSESSMENT:  CLINICAL IMPRESSION:  Emphasis of skilled PT session on reactive balance strategies, ankle strategy and single leg stability. Pt continues to be limited by fear-avoidance behavior and impaired vestibular function and proprioception. Pt required max verbal cues throughout session for encouragement to perform tasks and to sustain attention to task. Pt danced throughout session to "hype" himself up. Pt demonstrated improved narrow  BOS stability on foam w/EO today compared to previous session but is significantly limited w/EC. Continue POC.     OBJECTIVE IMPAIRMENTS Abnormal gait, cardiopulmonary status limiting activity, decreased balance, decreased mobility, and dizziness.   ACTIVITY LIMITATIONS standing, squatting, stairs, bathing, and locomotion level  PARTICIPATION LIMITATIONS:  driving, community activity, yard work, and church  PERSONAL FACTORS Past/current experiences and 3+ comorbidities: see above  are also affecting patient's functional outcome.   REHAB POTENTIAL: Good  CLINICAL DECISION MAKING: Evolving/moderate complexity  EVALUATION COMPLEXITY: Moderate  PLAN: PT FREQUENCY: 2x/week  PT DURATION: 8 weeks  PLANNED INTERVENTIONS: Therapeutic exercises, Therapeutic activity, Neuromuscular re-education, Balance training, Gait training, Patient/Family education, Stair training, Vestibular training, DME instructions, and Aquatic Therapy  PLAN FOR NEXT SESSION:  Keep instruction simple! High level balance, esp compliant surfaces, narrow BOS, EC and head motions.  Dynamic balance and gait activities.  Can continue to monitor BP, has been stable with repeat sessions. Add to HEP as able. Rebounder ball throws    Charlett Nose, PT, [place] [address] [place], [place]  [address] Phone:  [phone] Fax:  [phone] 12/14/21, 10:13 AM

## 2021-12-19 ENCOUNTER — Encounter: Payer: Self-pay | Admitting: Rehabilitation

## 2021-12-19 ENCOUNTER — Ambulatory Visit: Payer: Medicare Other | Attending: Family Medicine | Admitting: Rehabilitation

## 2021-12-19 DIAGNOSIS — R2681 Unsteadiness on feet: Secondary | ICD-10-CM | POA: Insufficient documentation

## 2021-12-19 DIAGNOSIS — R2689 Other abnormalities of gait and mobility: Secondary | ICD-10-CM | POA: Insufficient documentation

## 2021-12-19 NOTE — Therapy (Signed)
OUTPATIENT PHYSICAL THERAPY NEURO TREATMENT   Patient Name: Lonnie Skinner MRN: [id number] DOB:11-22-1960, 61 y.o., male Today's Date: 12/19/2021   PCP: Alen Bleacher, MD REFERRING PROVIDER: Dorris Singh, MD   PT End of Session - 12/19/21 1105     Visit Number 6    Number of Visits 17    Date for PT Re-Evaluation 01/27/22    Authorization Type UHC Medicare (needs 10th visit PN)    Progress Note Due on Visit 10    PT Start Time 1100    PT Stop Time 1145    PT Time Calculation (min) 45 min    Activity Tolerance Patient tolerated treatment well    Behavior During Therapy [facility] for tasks assessed/performed;Anxious              Past Medical History:  Diagnosis Date   CAD (coronary artery disease)    a. 05/2013: NSTEMI s/p DES to mRCA (culprit vessel), residual moderate LAD disease (the patient fails medical therapy and this was found to be significant, this would be amenable to PCI).   CHF (congestive heart failure) (HCC)    CKD (chronic kidney disease), stage II    DVT (deep venous thrombosis) ([place]) 1981   Hypercholesteremia    Hyperglycemia    Hypertension    Past Surgical History:  Procedure Laterality Date   CORONARY ARTERY BYPASS GRAFT N/A 08/09/2020   Procedure: CORONARY ARTERY BYPASS GRAFTING (CABG) X  FOUR   USING  LEFT INTERNAL MAMMARY ARTERY HARVEST AND RIGHT AND LEFT ENDOSCOPIC SAPHENOUS VEIN HARVEST;  Surgeon: [name], MD;  Location: [place];  Service: Open Heart Surgery;  Laterality: N/A;   CORONARY STENT PLACEMENT  05/2013   mRCA   CORONARY/GRAFT ACUTE MI REVASCULARIZATION  2015   INTRAVASCULAR PRESSURE WIRE/FFR STUDY N/A 08/02/2020   Procedure: INTRAVASCULAR PRESSURE WIRE/FFR STUDY;  Surgeon: [name], [name], MD;  Location: [place] CV LAB;  Service: Cardiovascular;  Laterality: N/A;   KNEE SURGERY Left 2009   LEFT HEART CATH AND CORONARY ANGIOGRAPHY N/A 08/02/2020   Procedure: LEFT HEART CATH AND CORONARY ANGIOGRAPHY;  Surgeon: [name], [name], MD;   Location: [place] CV [facility];  Service: Cardiovascular;  Laterality: N/A;   LEFT HEART CATHETERIZATION WITH CORONARY ANGIOGRAM N/A 06/02/2013   Procedure: LEFT HEART CATHETERIZATION WITH CORONARY ANGIOGRAM;  Surgeon: [name], MD;  Location: [facility] CATH LAB;  Service: Cardiovascular;  Laterality: N/A;   PERCUTANEOUS CORONARY STENT INTERVENTION (PCI-S)  06/02/2013   Procedure: PERCUTANEOUS CORONARY STENT INTERVENTION (PCI-S);  Surgeon: [name], MD;  Location: [facility] CATH LAB;  Service: Cardiovascular;;   TEE WITHOUT CARDIOVERSION N/A 08/09/2020   Procedure: TRANSESOPHAGEAL ECHOCARDIOGRAM (TEE);  Surgeon: [name], MD;  Location: [place];  Service: Open Heart Surgery;  Laterality: N/A;   Patient Active Problem List   Diagnosis Date Noted   Dizziness 11/03/2021   Ataxia 11/03/2021   Cough in adult patient 04/17/2021   Increased urinary frequency 04/17/2021   Well adult health check 04/05/2021   Knee pain 09/20/2020   Long term (current) use of anticoagulants 09/20/2020   Anxiety 09/20/2020   Gait abnormality 09/20/2020   Hypertension 09/20/2020   Hypertensive retinopathy, bilateral 09/20/2020   Specific muscle disorder 09/20/2020   Concern about eye disease without diagnosis 09/14/2020   S/P CABG x 4 08/09/2020   Chest pain, exertional 08/02/2020   Unstable angina (HCC) 08/02/2020   Chronic venous insufficiency 01/04/2017   Acute on chronic diastolic CHF (congestive heart failure) ([place])  05/10/2016   CAD (coronary artery disease)    CKD (chronic kidney disease) stage 2, GFR 60-89 ml/min    History of tobacco abuse 06/02/2013   Acute myocardial infarction of other inferior wall, initial episode of care    NSTEMI (non-ST elevated myocardial infarction) ([place]) 06/01/2013   Hyperlipidemia LDL goal <70 05/19/2009   Generalized anxiety disorder 05/19/2009   NICOTINE ADDICTION 05/19/2009   ADD 05/19/2009   Essential hypertension, benign 05/19/2009   UNEQUAL LEG LENGTH  05/19/2009    ONSET DATE: 11/22/2021 (referral date)  REFERRING DIAG: R26.9 (ICD-10-CM) - Gait abnormality   THERAPY DIAG:  Unsteadiness on feet  Other abnormalities of gait and mobility  Rationale for Evaluation and Treatment Rehabilitation  PRECAUTIONS: Fall  PERTINENT HISTORY: HTN, NSTEMI, CAD, S/p CABG   PATIENT GOALS "Not feel dizzy, feel steadier on my feet"  SUBJECTIVE: Pt comes in walking very fast saying "lets get to work."                                                                                                                                                                  PAIN:  Are you having pain? No   VITALS  There were no vitals filed for this visit.      TODAY'S TREATMENT: NMR  Continue to work on balance reactions on varying surfaces.  Began on incline standing on blue therapy mat (folded) with feet apart EO maintaining balance x 1 sets of 20 secs>feet together EO x 20 secs, feet apart with head motions (EO) x 10 reps in each direction. Progressed to lateral stepping from incline on mat to floor with one leg into lunge position returning to mat x 10 reps on each side.  Standing on blue therapy mat alt LE taps to cones (moving laterally) 2 sets of 6 reps progressing to tipping cone over and back up (alt LE) 2 sets of 6 reps.  Marked cues for improving R lateral weight shift and R stance when tapping LLE.  Had pt tap to varying targets on floor called out by PT stepping forward/laterally/posterior and crossing midline in order to work on improving reactionary balance techniques.  Ended session with attempting to do braiding however pt unable to follow sequence despite max cues and demonstration.    Therex: Quick warm up on scifit prior to NMR today x 5 mins with BUEs/LEs at level 5 maintaining rpms in 80-90's range.  Tolerated well.      PATIENT EDUCATION: Education details: continue with current HEP Person educated: Patient Education method:  Explanation and Verbal cues Education comprehension: needs further education    HOME EXERCISE PROGRAM: Access Code: [email or web address] URL: [email or web address] Date: 12/05/2021 Prepared by: Cameron Sprang  Exercises - Sit to Stand Without Arm Support  -  1 x daily - 7 x weekly - 2 sets - 10 reps - Tandem Walking with Counter Support  - 1 x daily - 7 x weekly - 2 sets - 4 reps - Wide Stance with Eyes Open  - 1 x daily - 7 x weekly - 1 sets - 3 reps - 20 secs hold - Wide Stance with Head Nods on Foam Pad  - 1 x daily - 7 x weekly - 1 sets - 10 reps - Wide Stance with Head Rotation on Foam Pad  - 1 x daily - 7 x weekly - 1 sets - 10 reps    GOALS: Goals reviewed with patient? Yes  SHORT TERM GOALS: Target date: 12/26/2021  Pt will be IND with initial HEP in order to indicate improved functional mobility and dec fall risk. Baseline: Goal status: INITIAL  2.  Will improve FGA score to >/=23/30 in order to indicate dec fall risk.  Baseline: 20/30 Goal status: REVISED  3.  Will assess gait speed and update goal to reflect progress.  Baseline: 3.81 ft/sec, will not need LTG as this is [facility].  Goal status: MET  4.  Pt will perform 4 steps with single rail, ambulate x 500' outdoors over paved and grassy surfaces at S level in order to indicate safe community mobility.  Baseline:  Goal status: INITIAL  5.  Will complete vestibular evaluation if needed and add goals.  Baseline: Will defer as he did not have any dizziness during session with balance challenges and head motion.  Goal status:  (DEFERRED)  6.  Pt will be able to perform last condition of MTCSIB for at least 30 secs in order to indicate improved balance.   Baseline: Not assessed  Goal Status: NEW    LONG TERM GOALS: Target date: 01/23/2022  Pt will be IND with final HEP in order to indicate improved functional mobility and dec fall risk. Baseline:  Goal status: INITIAL  2.  Will score >/=26/30 on FGA in order  to indicate dec fall risk.  Baseline: 20/30 Goal status: REVISED  3.  Pt will ambulate x 1000' over unlevel outdoor surfaces (paved and grassy) while scanning environment without overt LOB in order to indicate safe return to community activities.  Baseline:  Goal status: INITIAL  4. Pt will perform standing on foam airex with feet together EC for at least 30 secs with minimal sway in order to indicate improved balance.   Baseline:   Goal Status: NEW    ASSESSMENT:  CLINICAL IMPRESSION:  Skilled session continues to address balance reactions and multi sensory input with compliant surfaces, narrowing BOS and EC along with reactionary balance strategies.  Pt continues to be limited by fear avoidance however did somewhat better today when given tasks outside of // bars with no choice but to utilize balance strategies.      OBJECTIVE IMPAIRMENTS Abnormal gait, cardiopulmonary status limiting activity, decreased balance, decreased mobility, and dizziness.   ACTIVITY LIMITATIONS standing, squatting, stairs, bathing, and locomotion level  PARTICIPATION LIMITATIONS: driving, community activity, yard work, and church  PERSONAL FACTORS Past/current experiences and 3+ comorbidities: see above  are also affecting patient's functional outcome.   REHAB POTENTIAL: Good  CLINICAL DECISION MAKING: Evolving/moderate complexity  EVALUATION COMPLEXITY: Moderate  PLAN: PT FREQUENCY: 2x/week  PT DURATION: 8 weeks  PLANNED INTERVENTIONS: Therapeutic exercises, Therapeutic activity, Neuromuscular re-education, Balance training, Gait training, Patient/Family education, Stair training, Vestibular training, DME instructions, and Aquatic Therapy  PLAN FOR NEXT SESSION:  Keep instruction simple! High level balance, esp compliant surfaces, narrow BOS, EC and head motions.  Dynamic balance and gait activities.  Can continue to monitor BP, has been stable with repeat sessions. Add to HEP as able. Rebounder  ball throws    Cameron Sprang, PT, MPT [facility] [address] [place], [place], [address] Phone: [phone]   Fax:  [phone] 12/19/21, 12:58 PM

## 2021-12-22 ENCOUNTER — Ambulatory Visit: Payer: Medicare Other | Admitting: Physical Therapy

## 2021-12-22 VITALS — BP 134/80 | HR 61

## 2021-12-22 DIAGNOSIS — R2689 Other abnormalities of gait and mobility: Secondary | ICD-10-CM

## 2021-12-22 DIAGNOSIS — R2681 Unsteadiness on feet: Secondary | ICD-10-CM

## 2021-12-22 NOTE — Therapy (Signed)
OUTPATIENT PHYSICAL THERAPY NEURO TREATMENT   Patient Name: Lonnie Skinner MRN: [id number] DOB:September 16, 1960, 61 y.o., male Today's Date: 12/22/2021   PCP: Alen Bleacher, MD REFERRING PROVIDER: Dorris Singh, MD   PT End of Session - 12/22/21 1236     Visit Number 7    Number of Visits 17    Date for PT Re-Evaluation 01/27/22    Authorization Type UHC Medicare (needs 10th visit PN)    Progress Note Due on Visit 10    PT Start Time 1234    PT Stop Time 1318    PT Time Calculation (min) 44 min    Activity Tolerance Patient tolerated treatment well    Behavior During Therapy [facility] for tasks assessed/performed              Past Medical History:  Diagnosis Date   CAD (coronary artery disease)    a. 05/2013: NSTEMI s/p DES to mRCA (culprit vessel), residual moderate LAD disease (the patient fails medical therapy and this was found to be significant, this would be amenable to PCI).   CHF (congestive heart failure) (HCC)    CKD (chronic kidney disease), stage II    DVT (deep venous thrombosis) ([place]) 1981   Hypercholesteremia    Hyperglycemia    Hypertension    Past Surgical History:  Procedure Laterality Date   CORONARY ARTERY BYPASS GRAFT N/A 08/09/2020   Procedure: CORONARY ARTERY BYPASS GRAFTING (CABG) X  FOUR   USING  LEFT INTERNAL MAMMARY ARTERY HARVEST AND RIGHT AND LEFT ENDOSCOPIC SAPHENOUS VEIN HARVEST;  Surgeon: [name], MD;  Location: [place];  Service: Open Heart Surgery;  Laterality: N/A;   CORONARY STENT PLACEMENT  05/2013   mRCA   CORONARY/GRAFT ACUTE MI REVASCULARIZATION  2015   INTRAVASCULAR PRESSURE WIRE/FFR STUDY N/A 08/02/2020   Procedure: INTRAVASCULAR PRESSURE WIRE/FFR STUDY;  Surgeon: [name], [name], MD;  Location: [place] CV LAB;  Service: Cardiovascular;  Laterality: N/A;   KNEE SURGERY Left 2009   LEFT HEART CATH AND CORONARY ANGIOGRAPHY N/A 08/02/2020   Procedure: LEFT HEART CATH AND CORONARY ANGIOGRAPHY;  Surgeon: [name], [name], MD;   Location: [place] CV LAB;  Service: Cardiovascular;  Laterality: N/A;   LEFT HEART CATHETERIZATION WITH CORONARY ANGIOGRAM N/A 06/02/2013   Procedure: LEFT HEART CATHETERIZATION WITH CORONARY ANGIOGRAM;  Surgeon: [name], MD;  Location: [facility] CATH LAB;  Service: Cardiovascular;  Laterality: N/A;   PERCUTANEOUS CORONARY STENT INTERVENTION (PCI-S)  06/02/2013   Procedure: PERCUTANEOUS CORONARY STENT INTERVENTION (PCI-S);  Surgeon: [name], MD;  Location: [facility] CATH LAB;  Service: Cardiovascular;;   TEE WITHOUT CARDIOVERSION N/A 08/09/2020   Procedure: TRANSESOPHAGEAL ECHOCARDIOGRAM (TEE);  Surgeon: [name], MD;  Location: [place];  Service: Open Heart Surgery;  Laterality: N/A;   Patient Active Problem List   Diagnosis Date Noted   Dizziness 11/03/2021   Ataxia 11/03/2021   Cough in adult patient 04/17/2021   Increased urinary frequency 04/17/2021   Well adult health check 04/05/2021   Knee pain 09/20/2020   Long term (current) use of anticoagulants 09/20/2020   Anxiety 09/20/2020   Gait abnormality 09/20/2020   Hypertension 09/20/2020   Hypertensive retinopathy, bilateral 09/20/2020   Specific muscle disorder 09/20/2020   Concern about eye disease without diagnosis 09/14/2020   S/P CABG x 4 08/09/2020   Chest pain, exertional 08/02/2020   Unstable angina (HCC) 08/02/2020   Chronic venous insufficiency 01/04/2017   Acute on chronic diastolic CHF (congestive heart failure) ([place])  05/10/2016   CAD (coronary artery disease)    CKD (chronic kidney disease) stage 2, GFR 60-89 ml/min    History of tobacco abuse 06/02/2013   Acute myocardial infarction of other inferior wall, initial episode of care    NSTEMI (non-ST elevated myocardial infarction) ([place]) 06/01/2013   Hyperlipidemia LDL goal <70 05/19/2009   Generalized anxiety disorder 05/19/2009   NICOTINE ADDICTION 05/19/2009   ADD 05/19/2009   Essential hypertension, benign 05/19/2009   UNEQUAL LEG LENGTH  05/19/2009    ONSET DATE: 11/22/2021 (referral date)  REFERRING DIAG: R26.9 (ICD-10-CM) - Gait abnormality   THERAPY DIAG:  Unsteadiness on feet  Other abnormalities of gait and mobility  Rationale for Evaluation and Treatment Rehabilitation  PRECAUTIONS: Fall  PERTINENT HISTORY: HTN, NSTEMI, CAD, S/p CABG   PATIENT GOALS "Not feel dizzy, feel steadier on my feet"  SUBJECTIVE: Pt reports he is not performing his HEP regularly. "I come here to have something to do". Unable to answer if PT has been helpful or not.                                                                                                                                                                    PAIN:  Are you having pain? No   VITALS  Vitals:   12/22/21 1257  BP: 134/80  Pulse: 61       TODAY'S TREATMENT: Ther Act  STG assessment   [facility] PT Assessment - 12/22/21 1240       Functional Gait  Assessment   Gait assessed  Yes    Gait Level Surface Walks 20 ft in less than 5.5 sec, no assistive devices, good speed, no evidence for imbalance, normal gait pattern, deviates no more than 6 in outside of the 12 in walkway width.   4.85s   Change in Gait Speed Able to smoothly change walking speed without loss of balance or gait deviation. Deviate no more than 6 in outside of the 12 in walkway width.    Gait with Horizontal Head Turns Performs head turns smoothly with no change in gait. Deviates no more than 6 in outside 12 in walkway width    Gait with Vertical Head Turns Performs head turns with no change in gait. Deviates no more than 6 in outside 12 in walkway width.    Gait and Pivot Turn Pivot turns safely within 3 sec and stops quickly with no loss of balance.    Step Over Obstacle Is able to step over 2 stacked shoe boxes taped together (9 in total height) without changing gait speed. No evidence of imbalance.    Gait with Narrow Base of Support Ambulates 7-9 steps.    Gait with Eyes Closed  Walks 20 ft, uses  assistive device, slower speed, mild gait deviations, deviates 6-10 in outside 12 in walkway width. Ambulates 20 ft in less than 9 sec but greater than 7 sec.   8.44s   Ambulating Backwards Walks 20 ft, uses assistive device, slower speed, mild gait deviations, deviates 6-10 in outside 12 in walkway width.    Steps Alternating feet, must use rail.    Total Score 26    FGA comment: Low fall risk            LENGTHY discussion regarding outcome of FGA and what pt's goal in therapy is. Pt unable to answer what his deficits are and reports he has not been performing his HEP. Any time therapist would ask pt about his balance impairments, pt unable to answer. Objectively, pt demonstrates very few balance impairments. Pt's limiting factor is sedentary lifestyle and fear-avoidance behavior. Strongly encouraged pt to perform his HEP this weekend and report back on which exercises are difficult so therapy can determine POC. Pt did not verbalize understanding. Informed pt that at this time, therapist will have difficulty justifying keeping pt in therapy until September due to noncompliance w/HEP and being at LTG level already. Pt responded, "the other one has to determine that", referring to previous therapist. Informed pt of team mentality between therapists and to expect a shorter POC due to improvement in mobility. Pt did not verbalize understanding.     PATIENT EDUCATION: Education details: See above Person educated: Patient Education method: Explanation and Verbal cues Education comprehension: needs further education    HOME EXERCISE PROGRAM: Access Code: [email or web address] URL: [email or web address] Date: 12/05/2021 Prepared by: Cameron Sprang  Exercises - Sit to Stand Without Arm Support  - 1 x daily - 7 x weekly - 2 sets - 10 reps - Tandem Walking with Counter Support  - 1 x daily - 7 x weekly - 2 sets - 4 reps - Wide Stance with Eyes Open  - 1 x daily - 7 x weekly -  1 sets - 3 reps - 20 secs hold - Wide Stance with Head Nods on Foam Pad  - 1 x daily - 7 x weekly - 1 sets - 10 reps - Wide Stance with Head Rotation on Foam Pad  - 1 x daily - 7 x weekly - 1 sets - 10 reps    GOALS: Goals reviewed with patient? Yes  SHORT TERM GOALS: Target date: 12/26/2021  Pt will be IND with initial HEP in order to indicate improved functional mobility and dec fall risk. Baseline: Goal status: INITIAL  2.  Will improve FGA score to >/=23/30 in order to indicate dec fall risk.  Baseline: 20/30; 26/30 on 8/4  Goal status: MET  3.  Will assess gait speed and update goal to reflect progress.  Baseline: 3.81 ft/sec, will not need LTG as this is [facility].  Goal status: MET  4.  Pt will perform 4 steps with single rail, ambulate x 500' outdoors over paved and grassy surfaces at S level in order to indicate safe community mobility.  Baseline:  Goal status: INITIAL  5.  Will complete vestibular evaluation if needed and add goals.  Baseline: Will defer as he did not have any dizziness during session with balance challenges and head motion.  Goal status:  (DEFERRED)  6.  Pt will be able to perform last condition of MTCSIB for at least 30 secs in order to indicate improved balance.   Baseline: Not assessed  Goal Status: NEW  LONG TERM GOALS: Target date: 01/23/2022  Pt will be IND with final HEP in order to indicate improved functional mobility and dec fall risk. Baseline:  Goal status: INITIAL  2.  Will score >/=26/30 on FGA in order to indicate dec fall risk.  Baseline: 20/30; 26/30 on 8/4 Goal status: MET  3.  Pt will ambulate x 1000' over unlevel outdoor surfaces (paved and grassy) while scanning environment without overt LOB in order to indicate safe return to community activities.  Baseline:  Goal status: INITIAL  4. Pt will perform standing on foam airex with feet together EC for at least 30 secs with minimal sway in order to indicate improved balance.    Baseline:   Goal Status: NEW    ASSESSMENT:  CLINICAL IMPRESSION:  Emphasis of skilled PT session on STG assessment and pt education on POC. Pt achieved a 26/30 on FGA, surpassing his STG and achieving his LTG. Remainder of session spent discussing pt's increase in function and likely not needing to use all visits, which pt very disgruntled. Pt reported he likes that PT gives him a reason to leave his home but has not been compliant w/HEP despite strong encouragement by multiple therapists. Discussed with pt that therapy cannot justify keeping pt in therapy if he is noncompliant and has no significant deficits, as pt responds "I guess" when asked any question regarding balance or functional mobility. Will determine DC vs continue POC next session.     OBJECTIVE IMPAIRMENTS Abnormal gait, cardiopulmonary status limiting activity, decreased balance, decreased mobility, and dizziness.   ACTIVITY LIMITATIONS standing, squatting, stairs, bathing, and locomotion level  PARTICIPATION LIMITATIONS: driving, community activity, yard work, and church  PERSONAL FACTORS Past/current experiences and 3+ comorbidities: see above  are also affecting patient's functional outcome.   REHAB POTENTIAL: Good  CLINICAL DECISION MAKING: Evolving/moderate complexity  EVALUATION COMPLEXITY: Moderate  PLAN: PT FREQUENCY: 2x/week  PT DURATION: 8 weeks  PLANNED INTERVENTIONS: Therapeutic exercises, Therapeutic activity, Neuromuscular re-education, Balance training, Gait training, Patient/Family education, Stair training, Vestibular training, DME instructions, and Aquatic Therapy  PLAN FOR NEXT SESSION:  DC? Did pt do exercises? Keep instruction simple! High level balance, esp compliant surfaces, narrow BOS, EC and head motions.  Dynamic balance and gait activities.  Can continue to monitor BP, has been stable with repeat sessions. Add to HEP as able. Rebounder ball throws    Cruzita Lederer Lonnie Skinner, PT,  DPT 12/22/21, 1:21 PM

## 2021-12-26 ENCOUNTER — Encounter: Payer: Self-pay | Admitting: Rehabilitation

## 2021-12-26 ENCOUNTER — Ambulatory Visit: Payer: Medicare Other | Admitting: Rehabilitation

## 2021-12-26 DIAGNOSIS — R2689 Other abnormalities of gait and mobility: Secondary | ICD-10-CM

## 2021-12-26 DIAGNOSIS — R2681 Unsteadiness on feet: Secondary | ICD-10-CM | POA: Diagnosis not present

## 2021-12-26 NOTE — Therapy (Signed)
OUTPATIENT PHYSICAL THERAPY NEURO TREATMENT AND DC SUMMARY   Patient Name: Lonnie Skinner MRN: [id number] DOB:1961-02-12, 61 y.o., male Today's Date: 12/26/2021   PCP: Alen Bleacher, MD REFERRING PROVIDER: Dorris Singh, MD   PT End of Session - 12/26/21 1104     Visit Number 8    Number of Visits 17    Date for PT Re-Evaluation 01/27/22    Authorization Type [employer] Medicare (needs 10th visit PN)    Progress Note Due on Visit 10    PT Start Time 1104    PT Stop Time 1144    PT Time Calculation (min) 40 min    Activity Tolerance Patient tolerated treatment well    Behavior During Therapy [facility] for tasks assessed/performed              Past Medical History:  Diagnosis Date   CAD (coronary artery disease)    a. 05/2013: NSTEMI s/p DES to mRCA (culprit vessel), residual moderate LAD disease (the patient fails medical therapy and this was found to be significant, this would be amenable to PCI).   CHF (congestive heart failure) (HCC)    CKD (chronic kidney disease), stage II    DVT (deep venous thrombosis) ([place]) 1981   Hypercholesteremia    Hyperglycemia    Hypertension    Past Surgical History:  Procedure Laterality Date   CORONARY ARTERY BYPASS GRAFT N/A 08/09/2020   Procedure: CORONARY ARTERY BYPASS GRAFTING (CABG) X  FOUR   USING  LEFT INTERNAL MAMMARY ARTERY HARVEST AND RIGHT AND LEFT ENDOSCOPIC SAPHENOUS VEIN HARVEST;  Surgeon: [name], MD;  Location: [place];  Service: Open Heart Surgery;  Laterality: N/A;   CORONARY STENT PLACEMENT  05/2013   mRCA   CORONARY/GRAFT ACUTE MI REVASCULARIZATION  2015   INTRAVASCULAR PRESSURE WIRE/FFR STUDY N/A 08/02/2020   Procedure: INTRAVASCULAR PRESSURE WIRE/FFR STUDY;  Surgeon: [name], [name], MD;  Location: [place] [facility];  Service: Cardiovascular;  Laterality: N/A;   KNEE SURGERY Left 2009   LEFT HEART CATH AND CORONARY ANGIOGRAPHY N/A 08/02/2020   Procedure: LEFT HEART CATH AND CORONARY ANGIOGRAPHY;  Surgeon: [name], Peter  M, MD;  Location: [place] [facility];  Service: Cardiovascular;  Laterality: N/A;   LEFT HEART CATHETERIZATION WITH CORONARY ANGIOGRAM N/A 06/02/2013   Procedure: LEFT HEART CATHETERIZATION WITH CORONARY ANGIOGRAM;  Surgeon: [name], MD;  Location[facility];  Service: Cardiovascular;  Laterality: N/A;   PERCUTANEOUS CORONARY STENT INTERVENTION (PCI-S)  06/02/2013   Procedure: PERCUTANEOUS CORONARY STENT INTERVENTION (PCI-S);  Surgeon: [name], MD;  Location: [facility] CATH LAB;  Service: Cardiovascular;;   TEE WITHOUT CARDIOVERSION N/A 08/09/2020   Procedure: TRANSESOPHAGEAL ECHOCARDIOGRAM (TEE);  Surgeon: [name], MD;  Location: [place];  Service: Open Heart Surgery;  Laterality: N/A;   Patient Active Problem List   Diagnosis Date Noted   Dizziness 11/03/2021   Ataxia 11/03/2021   Cough in adult patient 04/17/2021   Increased urinary frequency 04/17/2021   Well adult health check 04/05/2021   Knee pain 09/20/2020   Long term (current) use of anticoagulants 09/20/2020   Anxiety 09/20/2020   Gait abnormality 09/20/2020   Hypertension 09/20/2020   Hypertensive retinopathy, bilateral 09/20/2020   Specific muscle disorder 09/20/2020   Concern about eye disease without diagnosis 09/14/2020   S/P CABG x 4 08/09/2020   Chest pain, exertional 08/02/2020   Unstable angina (HCC) 08/02/2020   Chronic venous insufficiency 01/04/2017   Acute on chronic diastolic CHF (congestive  heart failure) ([place]) 05/10/2016   CAD (coronary artery disease)    CKD (chronic kidney disease) stage 2, GFR 60-89 ml/min    History of tobacco abuse 06/02/2013   Acute myocardial infarction of other inferior wall, initial episode of care    NSTEMI (non-ST elevated myocardial infarction) ([place]) 06/01/2013   Hyperlipidemia LDL goal <70 05/19/2009   Generalized anxiety disorder 05/19/2009   NICOTINE ADDICTION 05/19/2009   ADD 05/19/2009   Essential hypertension, benign 05/19/2009   UNEQUAL LEG  LENGTH 05/19/2009    ONSET DATE: 11/22/2021 (referral date)  REFERRING DIAG: R26.9 (ICD-10-CM) - Gait abnormality   THERAPY DIAG:  Unsteadiness on feet  Other abnormalities of gait and mobility  Rationale for Evaluation and Treatment Rehabilitation  PRECAUTIONS: Fall  PERTINENT HISTORY: HTN, NSTEMI, CAD, S/p CABG   PATIENT GOALS "Not feel dizzy, feel steadier on my feet"  SUBJECTIVE: Pt reports he is not performing his HEP regularly. "I come here to have something to do". Unable to answer if PT has been helpful or not.                                                                                                                                                                    PAIN:  Are you having pain? No   VITALS  There were no vitals filed for this visit.      TODAY'S TREATMENT: Updated HEP, see below.        PATIENT EDUCATION: Education details: See above Person educated: Patient Education method: Explanation and Verbal cues Education comprehension: needs further education    HOME EXERCISE PROGRAM:  Access Code: [email or web address] URL: [email or web address] Date: 12/26/2021 Prepared by: Cameron Sprang  Exercises - Sit to Stand Without Arm Support  - 1 x daily - 7 x weekly - 2 sets - 10 reps - Tandem Walking with Counter Support  - 1 x daily - 7 x weekly - 2 sets - 4 reps - Romberg Stance Eyes Closed on Foam Pad  - 1-2 x daily - 7 x weekly - 1 sets - 3 reps - 15 secs-20 secs hold - Romberg Stance with Head Nods on Foam Pad  - 1-2 x daily - 7 x weekly - 1 sets - 10 reps - Romberg Stance on Foam Pad with Head Rotation  - 1-2 x daily - 7 x weekly - 1 sets - 10 reps   GOALS: Goals reviewed with patient? Yes  SHORT TERM GOALS: Target date: 12/26/2021  Pt will be IND with initial HEP in order to indicate improved functional mobility and dec fall risk. Baseline: Goal status: INITIAL  2.  Will improve FGA score to >/=23/30 in order to indicate  dec fall risk.  Baseline: 20/30; 26/30 on 8/4  Goal  status: MET  3.  Will assess gait speed and update goal to reflect progress.  Baseline: 3.81 ft/sec, will not need LTG as this is [facility].  Goal status: MET  4.  Pt will perform 4 steps with single rail, ambulate x 500' outdoors over paved and grassy surfaces at S level in order to indicate safe community mobility.  Baseline:  Goal status: INITIAL  5.  Will complete vestibular evaluation if needed and add goals.  Baseline: Will defer as he did not have any dizziness during session with balance challenges and head motion.  Goal status:  (DEFERRED)  6.  Pt will be able to perform last condition of MTCSIB for at least 30 secs in order to indicate improved balance.   Baseline: Not assessed  Goal Status: NEW    LONG TERM GOALS: Target date: 01/23/2022  Pt will be IND with final HEP in order to indicate improved functional mobility and dec fall risk. Baseline:  Goal status: MET (updated goals today to be more challenging)  2.  Will score >/=26/30 on FGA in order to indicate dec fall risk.  Baseline: 20/30; 26/30 on 8/4 Goal status: MET  3.  Pt will ambulate x 1000' over unlevel outdoor surfaces (paved and grassy) while scanning environment without overt LOB in order to indicate safe return to community activities.  Baseline:  Goal status: MET per pt report he is ambulating in community at [employer], etc.   4. Pt will perform standing on foam airex with feet together EC for at least 30 secs with minimal sway in order to indicate improved balance.   Baseline: Has progressed to doing 15 secs in session.  HEP updated to include this.   Goal Status: PROGRESSING   PHYSICAL THERAPY DISCHARGE SUMMARY  Visits from Start of Care: 8  Current functional level related to goals / functional outcomes: See LTGs above   Remaining deficits: Pt with high level vestibular system balance deficits, given HEP to address these.    Education /  Equipment: HEP    Patient agrees to discharge. Patient goals were met. Patient is being discharged due to meeting the stated rehab goals.    ASSESSMENT:  CLINICAL IMPRESSION:  Skilled session focused on LTG assessment and ensuring HEP is adequately challenging for DC based on progress towards goals in last session and the fact that pt is safe and functional in the community doing activities as he was before.  Pt has met all but last LTG and updated HEP to reflect this challenge.  Pt is extremely hesitant for D/C and stating "I just thought I'd be coming until Sept."  PT educated that I set 8 week POC when I had not assessed balance and mobility due to blood pressure issues.  These have been solved and balance has improved.  Pt mostly limited in sessions by fear avoidance.  Pt is ready for DC.     OBJECTIVE IMPAIRMENTS Abnormal gait, cardiopulmonary status limiting activity, decreased balance, decreased mobility, and dizziness.   ACTIVITY LIMITATIONS standing, squatting, stairs, bathing, and locomotion level  PARTICIPATION LIMITATIONS: driving, community activity, yard work, and church  PERSONAL FACTORS Past/current experiences and 3+ comorbidities: see above  are also affecting patient's functional outcome.   REHAB POTENTIAL: Good  CLINICAL DECISION MAKING: Evolving/moderate complexity  EVALUATION COMPLEXITY: Moderate  PLAN: PT FREQUENCY: 2x/week  PT DURATION: 8 weeks  PLANNED INTERVENTIONS: Therapeutic exercises, Therapeutic activity, Neuromuscular re-education, Balance training, Gait training, Patient/Family education, Stair training, Vestibular training, DME instructions, and Aquatic  Therapy  PLAN FOR NEXT SESSION:     Cameron Sprang, PT, MPT [facility] [address] [place], [place], [address] Phone: [phone]   Fax:  [phone] 12/26/21, 1:05 PM

## 2021-12-29 ENCOUNTER — Ambulatory Visit: Payer: Medicare Other | Admitting: Physical Therapy

## 2022-01-02 ENCOUNTER — Ambulatory Visit: Payer: Medicare Other | Admitting: Rehabilitation

## 2022-01-05 ENCOUNTER — Ambulatory Visit: Payer: Medicare Other | Admitting: Physical Therapy

## 2022-01-09 ENCOUNTER — Ambulatory Visit: Payer: Medicare Other

## 2022-01-11 ENCOUNTER — Ambulatory Visit: Payer: Medicare Other | Admitting: Physical Therapy

## 2022-01-17 ENCOUNTER — Ambulatory Visit: Payer: Medicare Other | Admitting: Physical Therapy

## 2022-01-19 ENCOUNTER — Ambulatory Visit: Payer: Medicare Other | Admitting: Physical Therapy

## 2022-01-23 ENCOUNTER — Ambulatory Visit: Payer: Medicare Other | Admitting: Physical Therapy

## 2022-01-26 ENCOUNTER — Ambulatory Visit: Payer: Medicare Other | Admitting: Physical Therapy

## 2022-01-30 ENCOUNTER — Ambulatory Visit: Payer: Medicare Other | Admitting: Physical Therapy

## 2022-02-01 ENCOUNTER — Telehealth: Payer: Self-pay

## 2022-02-01 NOTE — Telephone Encounter (Signed)
   Pre-operative Risk Assessment    Patient Name: Lonnie Skinner  DOB: [birth year] MRN: [id number]      Request for Surgical Clearance    Procedure:   Deep cleaning   Date of Surgery:  Clearance TBD                                 Surgeon:  Dr Juanetta Beets Surgeon's Group or Practice Name:  [facility] Phone number:  [phone]  Fax number:  [phone]   Type of Clearance Requested:   - Medical  - Pharmacy:  Hold Clopidogrel (Plavix)     Type of Anesthesia:  Local with epinephrine    Additional requests/questions:   after this deep cleaning the patient may have fillings and or extractions but a deeper cleaning has to be completed first.  [name], Bran Aldridge T   02/01/2022, 12:38 PM

## 2022-02-01 NOTE — Progress Notes (Deleted)
Cardiology Office Note:    Date:  02/01/2022   ID:  Lonnie Skinner, DOB 1961-03-12, MRN [id number]  PCP:  Alen Bleacher, MD   [place]  Cardiologist:  Freada Bergeron, MD  Advanced Practice Provider:  No care team member to display Electrophysiologist:  None    Referring MD: Alen Bleacher, MD    History of Present Illness:    Lonnie Skinner is a 61 y.o. male with a hx of CAD/NSTEMI s/p DES to [facility] with residual moderate LAD disease in 8756, chronic diastolic heart failure, HTN, HLD, CKD who was previously followed seen by Dr. [name] who now returns to clinic for follow-up.  Admitted back in early 2015 with CAD/NSTEMI - s/p DES to the mRCA, residual moderate LAD disease (if fails on medical therapy would proceed with PCI). He has typically not followed CV risk factor modification.   Was seen by me in clinic on 07/27/20 where he was having chest pain on exertion. Cath 08/02/20 showed critical 95% prox LAD, 70% OM1, 60% OM2, 70% prox RCA with abnormal FFR. He subsequently underwent CABG 07/2020 with LIMA-LAD, S-PDA, S-OM1, S-OM2 with Dr. Kipp Brood. TTE 08/12/20 with LVEF 60-65%, no WMA.   Was seen on 10/2021 by Richardson Dopp for dizziness. Recommended for zio monitor which is pending. Carotid ultrasound without significant disease. MRI brain without significant findings.  Was last seen in 11/2021 where he was doing well from a CV standpoint.   Today, ***   Past Medical History:  Diagnosis Date   CAD (coronary artery disease)    a. 05/2013: NSTEMI s/p DES to mRCA (culprit vessel), residual moderate LAD disease (the patient fails medical therapy and this was found to be significant, this would be amenable to PCI).   CHF (congestive heart failure) (HCC)    CKD (chronic kidney disease), stage II    DVT (deep venous thrombosis) ([place]) 1981   Hypercholesteremia    Hyperglycemia    Hypertension     Past Surgical History:  Procedure Laterality Date   CORONARY  ARTERY BYPASS GRAFT N/A 08/09/2020   Procedure: CORONARY ARTERY BYPASS GRAFTING (CABG) X  FOUR   USING  LEFT INTERNAL MAMMARY ARTERY HARVEST AND RIGHT AND LEFT ENDOSCOPIC SAPHENOUS VEIN HARVEST;  Surgeon: [name], MD;  Location: [place];  Service: Open Heart Surgery;  Laterality: N/A;   CORONARY STENT PLACEMENT  05/2013   mRCA   CORONARY/GRAFT ACUTE MI REVASCULARIZATION  2015   INTRAVASCULAR PRESSURE WIRE/FFR STUDY N/A 08/02/2020   Procedure: INTRAVASCULAR PRESSURE WIRE/FFR STUDY;  Surgeon: [name], [name], MD;  Location: [place] CV LAB;  Service: Cardiovascular;  Laterality: N/A;   KNEE SURGERY Left 2009   LEFT HEART CATH AND CORONARY ANGIOGRAPHY N/A 08/02/2020   Procedure: LEFT HEART CATH AND CORONARY ANGIOGRAPHY;  Surgeon: [name], [name], MD;  Location: [place] [facility];  Service: Cardiovascular;  Laterality: N/A;   LEFT HEART CATHETERIZATION WITH CORONARY ANGIOGRAM N/A 06/02/2013   Procedure: LEFT HEART CATHETERIZATION WITH CORONARY ANGIOGRAM;  Surgeon: [name], MD;  Location: [facility] CATH LAB;  Service: Cardiovascular;  Laterality: N/A;   PERCUTANEOUS CORONARY STENT INTERVENTION (PCI-S)  06/02/2013   Procedure: PERCUTANEOUS CORONARY STENT INTERVENTION (PCI-S);  Surgeon: [name], MD;  Location: [facility] C[facility] LAB;  Service: Cardiovascular;;   TEE WITHOUT CARDIOVERSION N/A 08/09/2020   Procedure: TRANSESOPHAGEAL ECHOCARDIOGRAM (TEE);  Surgeon: [name], MD;  Location: [place];  Service: Open Heart Surgery;  Laterality: N/A;  Current Medications: No outpatient medications have been marked as taking for the 02/12/22 encounter (Appointment) with Freada Bergeron, MD.     Allergies:   Penicillins   Social History   Socioeconomic History   Marital status: Widowed    Spouse name: Not on file   Number of children: 2   Years of education: 59   Highest education level: 11th grade  Occupational History    Comment: Disabled  Tobacco Use   Smoking  status: Former    Types: Cigarettes    Quit date: 2007    Years since quitting: 16.7    Passive exposure: Past   Smokeless tobacco: Never  Vaping Use   Vaping Use: Never used  Substance and Sexual Activity   Alcohol use: No   Drug use: No   Sexual activity: Not Currently    Birth control/protection: None  Other Topics Concern   Not on file  Social History Narrative   Patient lives alone in. Patients wife died ~1 year ago.    Patient has support from his son and some friends.    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    Patient recently graduated cardiac rehab and has been going to [employer] 3x per week for ~ 2 hours.    Social Determinants of Health   Financial Resource Strain: Low Risk  (07/13/2021)   Overall Financial Resource Strain (CARDIA)    Difficulty of Paying Living Expenses: Not hard at all  Food Insecurity: No Food Insecurity (07/13/2021)   Hunger Vital Sign    Worried About Running Out of Food in the Last Year: Never true    Ran Out of Food in the Last Year: Never true  Transportation Needs: No Transportation Needs (07/13/2021)   PRAPARE - Hydrologist (Medical): No    Lack of Transportation (Non-Medical): No  Physical Activity: Sufficiently Active (07/13/2021)   Exercise Vital Sign    Days of Exercise per Week: 3 days    Minutes of Exercise per Session: 120 min  Stress: No Stress Concern Present (07/13/2021)   [place]    Feeling of Stress : Not at all  Social Connections: Socially Isolated (07/13/2021)   Social Connection and Isolation Panel [NHANES]    Frequency of Communication with Friends and Family: More than three times a week    Frequency of Social Gatherings with Friends and Family: More than three times a week    Attends Religious Services: Never    Marine scientist or Organizations: No    Attends Archivist Meetings: Never     Marital Status: Widowed     Family History: The patient's family history includes Bone cancer in his father; CAD in an other family member; Diabetes in his mother; Heart attack in his brother and father; Heart disease in his brother; Heart failure in his father. There is no history of Colon cancer, Pancreatic cancer, Liver disease, or Esophageal cancer.  ROS:   Please see the history of present illness.    Review of Systems  Constitutional:  Negative for chills and fever.  HENT:  Negative for hearing loss.   Eyes:  Negative for blurred vision and redness.  Respiratory:  Negative for shortness of breath.   Cardiovascular:  Positive for leg swelling (Bilateral LE). Negative for chest pain, palpitations, orthopnea, claudication and PND.  Gastrointestinal:  Negative for melena, nausea and vomiting.  Genitourinary:  Negative for dysuria and flank pain.  Musculoskeletal:  Negative for myalgias.  Skin:  Negative for rash.  Neurological:  Negative for dizziness and loss of consciousness.  Endo/Heme/Allergies:  Negative for polydipsia.  Psychiatric/Behavioral:  Negative for depression. The patient is not nervous/anxious.     EKGs/Labs/Other Studies Reviewed:    The following studies were reviewed today: Carotid Ultrasound 10/2021: Summary:  Right Carotid: The extracranial vessels were near-normal with only minimal  wall                 thickening or plaque.   Left Carotid: The extracranial vessels were near-normal with only minimal  wall                thickening or plaque.   Vertebrals:  Bilateral vertebral arteries demonstrate antegrade flow.  Subclavians: Normal flow hemodynamics were seen in bilateral subclavian               arteries.   Lower Extremity Venous Reflux 09/20/20 Summary:  Right:  - No evidence of acute deep vein thrombosis seen in the right lower extremity, from the common femoral through the popliteal veins.  - Venous reflux is noted in the right common femoral  vein.  - Venous reflux is noted in the right sapheno-femoral junction.  - Venous reflux is noted in the right greater saphenous vein in the proximal thigh.  - Venous reflux is noted in the right short saphenous vein, fossa area.     Left:  - No evidence of acute deep vein thrombosis seen in the left lower extremity, from the common femoral through the popliteal veins.  - No evidence of superficial venous reflux seen in the left greater saphenous vein.  - No evidence of superficial venous reflux seen in the left short saphenous vein.  - Venous reflux is noted in the left common femoral vein.  - Venous reflux is noted in the left sapheno-femoral junction.   Echo TEE 08/09/20 PRE-OP FINDINGS   Left Ventricle: The left ventricle has normal systolic function, with an ejection fraction of 55-60%. The cavity size was normal. There is no increase in left ventricular wall thickness.   Right Ventricle: The right ventricle has normal systolic function. The cavity was normal. There is no increase in right ventricular wall thickness.   Left Atrium: Left atrial size was not assessed. No left atrial/left atrial appendage thrombus was detected.   Right Atrium: Right atrial size was not assessed.   Interatrial Septum: The interatrial septum was not assessed.   Pericardium: A small pericardial effusion is present. The pericardial effusion is localized near the right ventricle.   Mitral Valve: The mitral valve is normal in structure. Mitral valve regurgitation is trivial by color flow Doppler.   Tricuspid Valve: The tricuspid valve was normal in structure. Tricuspid valve regurgitation is trivial by color flow Doppler.   Aortic Valve: The aortic valve is bicuspid Aortic valve regurgitation was not visualized by color flow Doppler. There is no stenosis of the aortic valve.   Pulmonic Valve: The pulmonic valve was normal in structure. Pulmonic valve regurgitation is trivial by color flow Doppler.     Cath 08/02/20: Prox LAD lesion is 95% stenosed. Prox LAD to Mid LAD lesion is 80% stenosed. 1st Mrg lesion is 75% stenosed. 2nd Mrg lesion is 60% stenosed. Prox RCA to Mid RCA lesion is 70% stenosed. Previously placed Mid RCA-1 stent (unknown type) is widely patent. Mid RCA-2 lesion is 50% stenosed. The left ventricular  systolic function is normal. LV end diastolic pressure is mildly elevated. The left ventricular ejection fraction is 55-65% by visual estimate.   1. 3 vessel obstructive CAD    - critical 95% proximal LAD    - 70% OM1    - 60% OM2    - 70% mid RCA proximal to prior stent with abnormal RFR 2. Normal LV function 3. Mildly elevated LVEDP   Plan: recommend CABG. Will hold Plavix. Admit to telemetry and treat with IV heparin. Check Echo.   TTE 08/02/20: IMPRESSIONS   1. Left ventricular ejection fraction, by estimation, is 60 to 65%. The left ventricle has normal function. The left ventricle has no regional wall motion abnormalities. Left ventricular diastolic parameters were normal.   2. Right ventricular systolic function is normal. The right ventricular size is normal. Tricuspid regurgitation signal is inadequate for assessing PA pressure.   3. The mitral valve is normal in structure. No evidence of mitral valve regurgitation. No evidence of mitral stenosis.   4. The aortic valve is normal in structure. Aortic valve regurgitation is not visualized. No aortic stenosis is present.   5. The inferior vena cava is normal in size with greater than 50% respiratory variability, suggesting right atrial pressure of 3 mmHg.   06/18/13  ECHO Study Conclusions  Left ventricle: The cavity size was normal. There was mild focal basal hypertrophy of the septum. Systolic function was normal. The estimated ejection fraction was in the range of 55% to 60%. Wall motion was normal; there were no regional wall motion abnormalities. Features are consistent with a pseudonormal left  ventricular filling pattern, with concomitant abnormal relaxation and increased filling pressure (grade 2 diastolic dysfunction).  Impressions:  - No prior study for comparison.  ANGIOGRAPHIC DATA: The left main coronary artery is absent. There appear to be separate ostia of the LAD and circumflex.  The left anterior descending artery is a large vessel which reaches the apex. In the mid vessel, there is a 50-70% lesion in the mid LAD. There is mild to moderate diffuse disease. There 2 large diagonals which are widely patent. The apical LAD is small but patent.  The left circumflex artery is a medium size vessel. There appears to be some vasospasm proximally. There is a large first marginal which is patent. The second marginal is a larger vessel which branches across the lateral wall. This appears widely patent.  The right coronary artery is a large dominant vessel. In the mid vessel, there is a focal 99% stenosis with visible thrombus. The posterior lateral artery is very large and widely patent. The posterior descending artery is widely patent.  LEFT VENTRICULOGRAM: Left ventricular angiogram was not done. LVEDP was 13 mmHg.  PCI NARRATIVE: A JR 4 guiding catheters u used to engage the RCA. Angiomax was used for anticoagulation. Initially, a pro-water wire was advanced but would not cross the lesion. A Fielder XT density cross the lesion in the mid right coronary artery. A 2.5 x 12 balloon was used to predilate the lesion. A 2.75 x 16 promise drug-eluting stent was then deployed. The stent was post dilated with a 3.25 x 12 noncompliant balloon. There is an excellent angiographic result. There is no residual stenosis. There were several areas of vasospasm during intervention noted in the distal right and posterior lateral artery. Both resolved with intracoronary nitroglycerin.  EKG:  EKG not performed today  Recent Labs: 04/05/2021: ALT 14 10/30/2021: BUN 15; Creatinine, Ser 1.21; Hemoglobin 15.9;  Platelets 229;  Potassium 4.6; Sodium 137  Recent Lipid Panel    Component Value Date/Time   CHOL 150 12/05/2021 1016   TRIG 138 12/05/2021 1016   HDL 39 (L) 12/05/2021 1016   CHOLHDL 3.8 12/05/2021 1016   CHOLHDL 3.1 02/13/2016 1540   VLDL 21 02/13/2016 1540   LDLCALC 87 12/05/2021 1016     Risk Assessment/Calculations:       Physical Exam:    VS:  There were no vitals taken for this visit.    Wt Readings from Last 3 Encounters:  12/05/21 220 lb (99.8 kg)  12/04/21 218 lb 12.8 oz (99.2 kg)  11/13/21 216 lb (98 kg)     GEN:  Well nourished, well developed in no acute distress HEENT: Normal NECK: No JVD; No carotid bruits CARDIAC: RRR, no murmurs, rubs, gallops RESPIRATORY:  Clear to auscultation without rales, wheezing or rhonchi  ABDOMEN: Soft, non-tender, non-distended MUSCULOSKELETAL: Mild bilateral LE edema; No deformity; Chronic venous stasis changes SKIN: Warm and dry NEUROLOGIC:  Alert and oriented x 3 PSYCHIATRIC:  Normal affect   ASSESSMENT:    No diagnosis found.   PLAN:    In order of problems listed above:  #Dizziness: Suspect related to orthostasis due to episodes occurring while standing in the heat or at church. Reassuring work-up and BP well controlled with no episodes of lows. MRI head without acute pathology. Carotids with no significant disease. Zio 11/2021 with frequent PVCs (6.4%) but no sustained arrhythmias.  #Severe multivessel CAD s/p CABG Patient with history of NSTEMI in in 2015 s/p DES to mid-RCA with known residual moderate LAD disease. Presented to clinic with exertional chest pain in 07/2020 found to have multivessel CAD s/p 4v CABG on 07/2020 with L-LAD, S-PDA, S-OM1, S-OM2. Doing well.  -Continue plavix '75mg'$  daily -Continue aspirin '81mg'$  -Continue carvedilol 6.'25mg'$  BID -Continue atorvastatin '80mg'$  daily  #Frequent PVCs: Noted to have 6.4% burden on Zio monitor. -Continue coreg 9.'375mg'$  BID  #Essential hypertension: Well  controlled and at goal <120/80s. -Continue coreg 9.'375mg'$  BID  #Chronic diastolic CHF: Stable and euvolemic on examination.  -Continue lasix '20mg'$  daily -Continue coreg 9.'375mg'$  BID -Continue spiro 12.'5mg'$  daily -Low Na diet  #CKD: Stable. -Follow-up with PCP as scheduled  #HLD: -Continue atorvastatin '80mg'$  as above -LDL was elevated 87 but was not taking the cholesterol medication at that time; has since been taking it consistently -Repeat lipids in 4 weeks   Medication Adjustments/Labs and Tests Ordered: Current medicines are reviewed at length with the patient today.  Concerns regarding medicines are outlined above.  No orders of the defined types were placed in this encounter.  No orders of the defined types were placed in this encounter.   There are no Patient Instructions on file for this visit.   Signed, Freada Bergeron, MD  02/01/2022 8:13 PM    [place]

## 2022-02-02 NOTE — Telephone Encounter (Signed)
   Primary Cardiologist: Freada Bergeron, MD  Chart reviewed as part of pre-operative protocol coverage. Deep cleanings and simple dental extractions (1 or 2 teeth) are considered low risk procedures per guidelines and generally do not require any specific cardiac clearance. It is also generally accepted that for simple extractions and dental cleanings, there is no need to interrupt blood thinner therapy. Patient may receive epinephrine for his dental procedures.   SBE prophylaxis is not required for the patient.  I will route this recommendation to the requesting party via Epic fax function and remove from pre-op pool.  Please call with questions.  Emmaline Life, NP-C     02/02/2022, 1:02 PM [address] Office [phone] Fax [phone]

## 2022-02-12 ENCOUNTER — Encounter: Payer: Self-pay | Admitting: Cardiology

## 2022-02-12 ENCOUNTER — Ambulatory Visit: Payer: No Typology Code available for payment source | Attending: Cardiology | Admitting: Cardiology

## 2022-02-12 VITALS — BP 128/74 | HR 66 | Ht 68.0 in | Wt 213.0 lb

## 2022-02-12 DIAGNOSIS — I5032 Chronic diastolic (congestive) heart failure: Secondary | ICD-10-CM

## 2022-02-12 DIAGNOSIS — I2510 Atherosclerotic heart disease of native coronary artery without angina pectoris: Secondary | ICD-10-CM | POA: Diagnosis not present

## 2022-02-12 DIAGNOSIS — N182 Chronic kidney disease, stage 2 (mild): Secondary | ICD-10-CM

## 2022-02-12 DIAGNOSIS — R42 Dizziness and giddiness: Secondary | ICD-10-CM | POA: Diagnosis not present

## 2022-02-12 DIAGNOSIS — E782 Mixed hyperlipidemia: Secondary | ICD-10-CM

## 2022-02-12 DIAGNOSIS — I10 Essential (primary) hypertension: Secondary | ICD-10-CM

## 2022-02-12 DIAGNOSIS — E785 Hyperlipidemia, unspecified: Secondary | ICD-10-CM | POA: Diagnosis not present

## 2022-02-12 DIAGNOSIS — Z79899 Other long term (current) drug therapy: Secondary | ICD-10-CM

## 2022-02-12 NOTE — Patient Instructions (Signed)
Medication Instructions:   Your physician recommends that you continue on your current medications as directed. Please refer to the Current Medication list given to you today.  *If you need a refill on your cardiac medications before your next appointment, please call your pharmacy*   Lab Work:  SOMETIME THIS WEEK HERE IN THE OFFICE--LIPIDS--PLEASE COME FASTING TO THIS LAB APPOINTMENT  If you have labs (blood work) drawn today and your tests are completely normal, you will receive your results only by: [place] (if you have MyChart) OR A paper copy in the mail If you have any lab test that is abnormal or we need to change your treatment, we will call you to review the results.   Follow-Up: At [facility], you and your health needs are our priority.  As part of our continuing mission to provide you with exceptional heart care, we have created designated Provider Care Teams.  These Care Teams include your primary Cardiologist (physician) and Advanced Practice Providers (APPs -  Physician Assistants and Nurse Practitioners) who all work together to provide you with the care you need, when you need it.  We recommend signing up for the patient portal called "MyChart".  Sign up information is provided on this After Visit Summary.  MyChart is used to connect with patients for Virtual Visits (Telemedicine).  Patients are able to view lab/test results, encounter notes, upcoming appointments, etc.  Non-urgent messages can be sent to your provider as well.   To learn more about what you can do with MyChart, go to [email or web address].    Your next appointment:   6 month(s)  The format for your next appointment:   In Person  Provider:   Robbie Lis, PA-C, Nicholes Rough, PA-C, Melina Copa, PA-C, Ambrose Pancoast, NP, Cecilie Kicks, NP, Ermalinda Barrios, PA-C, Christen Bame, NP, or Richardson Dopp, PA-C          Important Information About Sugar

## 2022-02-12 NOTE — Progress Notes (Signed)
Cardiology Office Note:    Date:  02/12/2022   ID:  Lonnie Skinner, DOB [birth year], MRN [id number]  PCP:  Alen Bleacher, MD   [place]  Cardiologist:  Freada Bergeron, MD  Advanced Practice Provider:  No care team member to display Electrophysiologist:  None    Referring MD: Alen Bleacher, MD    History of Present Illness:    Lonnie Skinner is a 61 y.o. male with a hx of CAD/NSTEMI s/p DES to [facility] with residual moderate LAD disease in 7824, chronic diastolic heart failure, HTN, HLD, CKD who was previously followed seen by Dr. [name] who now returns to clinic for follow-up.  Admitted back in early 2015 with CAD/NSTEMI - s/p DES to the mRCA, residual moderate LAD disease (if fails on medical therapy would proceed with PCI). He has typically not followed CV risk factor modification.   Was seen by me in clinic on 07/27/20 where he was having chest pain on exertion. Cath 08/02/20 showed critical 95% prox LAD, 70% OM1, 60% OM2, 70% prox RCA with abnormal FFR. He subsequently underwent CABG 07/2020 with LIMA-LAD, S-PDA, S-OM1, S-OM2 with Dr. Kipp Brood. TTE 08/12/20 with LVEF 60-65%, no WMA.   Was seen on 10/2021 by Richardson Dopp for dizziness. Recommended for zio monitor which is pending. Carotid ultrasound without significant disease. MRI brain without significant findings.  Was last seen in 11/2021 where he was doing well from a CV standpoint.   Today, the patient states that he is feeling well. He reports that consuming fluids has helped his dizziness to resolve. He has been compliant with his medications and reports no bleeding issues.   His cholesterol was found to be high in July. He states that he was not keeping up with his cholesterol medication, but has resumed his rosuvastatin since July.   He denies any palpitations, chest pain, or shortness of breath. No lightheadedness, headaches, syncope, orthopnea, or PND.  Soon he will have dental work  performed.  Past Medical History:  Diagnosis Date   CAD (coronary artery disease)    a. 05/2013: NSTEMI s/p DES to mRCA (culprit vessel), residual moderate LAD disease (the patient fails medical therapy and this was found to be significant, this would be amenable to PCI).   CHF (congestive heart failure) (HCC)    CKD (chronic kidney disease), stage II    DVT (deep venous thrombosis) ([place]) 1981   Hypercholesteremia    Hyperglycemia    Hypertension     Past Surgical History:  Procedure Laterality Date   CORONARY ARTERY BYPASS GRAFT N/A 08/09/2020   Procedure: CORONARY ARTERY BYPASS GRAFTING (CABG) X  FOUR   USING  LEFT INTERNAL MAMMARY ARTERY HARVEST AND RIGHT AND LEFT ENDOSCOPIC SAPHENOUS VEIN HARVEST;  Surgeon: [name], MD;  Location: [place];  Service: Open Heart Surgery;  Laterality: N/A;   CORONARY STENT PLACEMENT  05/2013   mRCA   CORONARY/GRAFT ACUTE MI REVASCULARIZATION  2015   INTRAVASCULAR PRESSURE WIRE/FFR STUDY N/A 08/02/2020   Procedure: INTRAVASCULAR PRESSURE WIRE/FFR STUDY;  Surgeon: [name], [name], MD;  Location: [place] CV LAB;  Service: Cardiovascular;  Laterality: N/A;   KNEE SURGERY Left 2009   LEFT HEART CATH AND CORONARY ANGIOGRAPHY N/A 08/02/2020   Procedure: LEFT HEART CATH AND CORONARY ANGIOGRAPHY;  Surgeon: [name], [name], MD;  Location: [place] CV LAB;  Service: Cardiovascular;  Laterality: N/A;   LEFT HEART CATHETERIZATION WITH CORONARY ANGIOGRAM N/A 06/02/2013   Procedure: LEFT  HEART CATHETERIZATION WITH CORONARY ANGIOGRAM;  Surgeon: [name], MD;  Location: [facility] CATH LAB;  Service: Cardiovascular;  Laterality: N/A;   PERCUTANEOUS CORONARY STENT INTERVENTION (PCI-S)  06/02/2013   Procedure: PERCUTANEOUS CORONARY STENT INTERVENTION (PCI-S);  Surgeon: [name], MD;  Location: [facility] CATH LAB;  Service: Cardiovascular;;   TEE WITHOUT CARDIOVERSION N/A 08/09/2020   Procedure: TRANSESOPHAGEAL ECHOCARDIOGRAM (TEE);  Surgeon: [name], MD;  Location: [place];  Service: Open Heart Surgery;  Laterality: N/A;    Current Medications: Current Meds  Medication Sig   ALPRAZolam (XANAX) 0.25 MG tablet TAKE 1 TABLET(0.25 MG) BY MOUTH TWICE DAILY AS NEEDED FOR ANXIETY   aspirin 81 MG EC tablet Take 1 tablet (81 mg total) by mouth daily.   atorvastatin (LIPITOR) 80 MG tablet Take 1 tablet (80 mg total) by mouth every evening.   carvedilol (COREG) 6.25 MG tablet Take 1.5 tablets (9.375 mg total) by mouth 2 (two) times daily.   clopidogrel (PLAVIX) 75 MG tablet Take 1 tablet (75 mg total) by mouth daily.   furosemide (LASIX) 20 MG tablet Take 1 tablet (20 mg total) by mouth daily.   nitroGLYCERIN (NITROSTAT) 0.4 MG SL tablet Place 1 tablet (0.4 mg total) under the tongue every 5 (five) minutes as needed for chest pain (up to 3 doses).   spironolactone (ALDACTONE) 25 MG tablet Take 0.5 tablets (12.5 mg total) by mouth daily.   traMADol (ULTRAM) 50 MG tablet Take 1 tablet (50 mg total) by mouth every 4 (four) hours as needed for moderate pain.     Allergies:   Penicillins   Social History   Socioeconomic History   Marital status: Widowed    Spouse name: Not on file   Number of children: 2   Years of education: 57   Highest education level: 11th grade  Occupational History    Comment: Disabled  Tobacco Use   Smoking status: Former    Types: Cigarettes    Quit date: 2007    Years since quitting: 16.7    Passive exposure: Past   Smokeless tobacco: Never  Vaping Use   Vaping Use: Never used  Substance and Sexual Activity   Alcohol use: No   Drug use: No   Sexual activity: Not Currently    Birth control/protection: None  Other Topics Concern   Not on file  Social History Narrative   Patient lives alone in. Patients wife died ~1 year ago.    Patient has support from his son and some friends.    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    Patient recently graduated cardiac rehab and has been going to  [employer] 3x per week for ~ 2 hours.    Social Determinants of Health   Financial Resource Strain: Low Risk  (07/13/2021)   Overall Financial Resource Strain (CARDIA)    Difficulty of Paying Living Expenses: Not hard at all  Food Insecurity: No Food Insecurity (07/13/2021)   Hunger Vital Sign    Worried About Running Out of Food in the Last Year: Never true    Ran Out of Food in the Last Year: Never true  Transportation Needs: No Transportation Needs (07/13/2021)   PRAPARE - Hydrologist (Medical): No    Lack of Transportation (Non-Medical): No  Physical Activity: Sufficiently Active (07/13/2021)   Exercise Vital Sign    Days of Exercise per Week: 3 days    Minutes of Exercise  per Session: 120 min  Stress: No Stress Concern Present (07/13/2021)   [place]    Feeling of Stress : Not at all  Social Connections: Socially Isolated (07/13/2021)   Social Connection and Isolation Panel [NHANES]    Frequency of Communication with Friends and Family: More than three times a week    Frequency of Social Gatherings with Friends and Family: More than three times a week    Attends Religious Services: Never    Marine scientist or Organizations: No    Attends Archivist Meetings: Never    Marital Status: Widowed     Family History: The patient's family history includes Bone cancer in his father; CAD in an other family member; Diabetes in his mother; Heart attack in his brother and father; Heart disease in his brother; Heart failure in his father. There is no history of Colon cancer, Pancreatic cancer, Liver disease, or Esophageal cancer.  ROS:   Please see the history of present illness.    Review of Systems  Constitutional:  Negative for chills and fever.  HENT:  Negative for hearing loss.   Eyes:  Negative for blurred vision and redness.  Respiratory:  Negative for shortness of  breath.   Cardiovascular:  Positive for leg swelling (Bilateral LE). Negative for chest pain, palpitations, orthopnea, claudication and PND.  Gastrointestinal:  Negative for melena, nausea and vomiting.  Genitourinary:  Negative for dysuria and flank pain.  Musculoskeletal:  Negative for myalgias.  Skin:  Negative for rash.  Neurological:  Negative for dizziness and loss of consciousness.  Endo/Heme/Allergies:  Negative for polydipsia.  Psychiatric/Behavioral:  Negative for depression. The patient is not nervous/anxious.     EKGs/Labs/Other Studies Reviewed:    The following studies were reviewed today: Monitor 11/2021:    Predominant rhythm was 6 days and 12 hours   Predominant rhythm was NSR with average HR 72bpm   There were 11 runs of SVT with longest lasting 9 beats   Frequent VE (6.4%), rare SVE (<1%)   Patient triggered event correlates with VE   No sustained arrhythmias or significant pauses     Patch Wear Time:  6 days and 12 hours (2023-07-07T10:42:58-0400 to 2023-07-13T22:48:15-0400)   Patient had a min HR of 51 bpm, max HR of 129 bpm, and avg HR of 72 bpm. Predominant underlying rhythm was Sinus Rhythm. 11 Supraventricular Tachycardia runs occurred, the run with the fastest interval lasting 9 beats with a max rate of 129 bpm (avg 116  bpm); the run with the fastest interval was also the longest. Isolated SVEs were rare (<1.0%), SVE Couplets were rare (<1.0%), and SVE Triplets were rare (<1.0%). Isolated VEs were frequent (6.4%, [id number]), VE Couplets were rare (<1.0%, 2040), and VE  Triplets were rare (<1.0%, 2). Ventricular Bigeminy and Trigeminy were present.  Myocardial Perfusion Imaging 11/13/2021   The study is normal. The study is low risk.   No ST deviation was noted.   LV perfusion is normal. There is no evidence of ischemia. There is no evidence of infarction.   Left ventricular function is normal. Nuclear stress EF: 56 %. The left ventricular ejection fraction is  normal (55-65%). End diastolic cavity size is normal. End systolic cavity size is normal.   Prior study not available for comparison.   Normal stress nuclear study with no ischemia or infarction.  Gated ejection fraction 56% with normal wall motion.  Carotid Ultrasound 10/2021: Summary:  Right  Carotid: The extracranial vessels were near-normal with only minimal  wall                 thickening or plaque.   Left Carotid: The extracranial vessels were near-normal with only minimal  wall                thickening or plaque.   Vertebrals:  Bilateral vertebral arteries demonstrate antegrade flow.  Subclavians: Normal flow hemodynamics were seen in bilateral subclavian               arteries.   Lower Extremity Venous Reflux 09/20/20 Summary:  Right:  - No evidence of acute deep vein thrombosis seen in the right lower extremity, from the common femoral through the popliteal veins.  - Venous reflux is noted in the right common femoral vein.  - Venous reflux is noted in the right sapheno-femoral junction.  - Venous reflux is noted in the right greater saphenous vein in the proximal thigh.  - Venous reflux is noted in the right short saphenous vein, fossa area.     Left:  - No evidence of acute deep vein thrombosis seen in the left lower extremity, from the common femoral through the popliteal veins.  - No evidence of superficial venous reflux seen in the left greater saphenous vein.  - No evidence of superficial venous reflux seen in the left short saphenous vein.  - Venous reflux is noted in the left common femoral vein.  - Venous reflux is noted in the left sapheno-femoral junction.   Echo TEE 08/09/20 PRE-OP FINDINGS   Left Ventricle: The left ventricle has normal systolic function, with an ejection fraction of 55-60%. The cavity size was normal. There is no increase in left ventricular wall thickness.   Right Ventricle: The right ventricle has normal systolic function. The cavity was  normal. There is no increase in right ventricular wall thickness.   Left Atrium: Left atrial size was not assessed. No left atrial/left atrial appendage thrombus was detected.   Right Atrium: Right atrial size was not assessed.   Interatrial Septum: The interatrial septum was not assessed.   Pericardium: A small pericardial effusion is present. The pericardial effusion is localized near the right ventricle.   Mitral Valve: The mitral valve is normal in structure. Mitral valve regurgitation is trivial by color flow Doppler.   Tricuspid Valve: The tricuspid valve was normal in structure. Tricuspid valve regurgitation is trivial by color flow Doppler.   Aortic Valve: The aortic valve is bicuspid Aortic valve regurgitation was not visualized by color flow Doppler. There is no stenosis of the aortic valve.   Pulmonic Valve: The pulmonic valve was normal in structure. Pulmonic valve regurgitation is trivial by color flow Doppler.    Cath 08/02/20: Prox LAD lesion is 95% stenosed. Prox LAD to Mid LAD lesion is 80% stenosed. 1st Mrg lesion is 75% stenosed. 2nd Mrg lesion is 60% stenosed. Prox RCA to Mid RCA lesion is 70% stenosed. Previously placed Mid RCA-1 stent (unknown type) is widely patent. Mid RCA-2 lesion is 50% stenosed. The left ventricular systolic function is normal. LV end diastolic pressure is mildly elevated. The left ventricular ejection fraction is 55-65% by visual estimate.   1. 3 vessel obstructive CAD    - critical 95% proximal LAD    - 70% OM1    - 60% OM2    - 70% mid RCA proximal to prior stent with abnormal RFR 2. Normal LV function 3. Mildly elevated LVEDP  Plan: recommend CABG. Will hold Plavix. Admit to telemetry and treat with IV heparin. Check Echo.   TTE 08/02/20: IMPRESSIONS   1. Left ventricular ejection fraction, by estimation, is 60 to 65%. The left ventricle has normal function. The left ventricle has no regional wall motion abnormalities. Left  ventricular diastolic parameters were normal.   2. Right ventricular systolic function is normal. The right ventricular size is normal. Tricuspid regurgitation signal is inadequate for assessing PA pressure.   3. The mitral valve is normal in structure. No evidence of mitral valve regurgitation. No evidence of mitral stenosis.   4. The aortic valve is normal in structure. Aortic valve regurgitation is not visualized. No aortic stenosis is present.   5. The inferior vena cava is normal in size with greater than 50% respiratory variability, suggesting right atrial pressure of 3 mmHg.   06/18/13  ECHO Study Conclusions  Left ventricle: The cavity size was normal. There was mild focal basal hypertrophy of the septum. Systolic function was normal. The estimated ejection fraction was in the range of 55% to 60%. Wall motion was normal; there were no regional wall motion abnormalities. Features are consistent with a pseudonormal left ventricular filling pattern, with concomitant abnormal relaxation and increased filling pressure (grade 2 diastolic dysfunction).  Impressions:  - No prior study for comparison.  ANGIOGRAPHIC DATA: The left main coronary artery is absent. There appear to be separate ostia of the LAD and circumflex.  The left anterior descending artery is a large vessel which reaches the apex. In the mid vessel, there is a 50-70% lesion in the mid LAD. There is mild to moderate diffuse disease. There 2 large diagonals which are widely patent. The apical LAD is small but patent.  The left circumflex artery is a medium size vessel. There appears to be some vasospasm proximally. There is a large first marginal which is patent. The second marginal is a larger vessel which branches across the lateral wall. This appears widely patent.  The right coronary artery is a large dominant vessel. In the mid vessel, there is a focal 99% stenosis with visible thrombus. The posterior lateral artery is very  large and widely patent. The posterior descending artery is widely patent.  LEFT VENTRICULOGRAM: Left ventricular angiogram was not done. LVEDP was 13 mmHg.  PCI NARRATIVE: A JR 4 guiding catheters u used to engage the RCA. Angiomax was used for anticoagulation. Initially, a pro-water wire was advanced but would not cross the lesion. A Fielder XT density cross the lesion in the mid right coronary artery. A 2.5 x 12 balloon was used to predilate the lesion. A 2.75 x 16 promise drug-eluting stent was then deployed. The stent was post dilated with a 3.25 x 12 noncompliant balloon. There is an excellent angiographic result. There is no residual stenosis. There were several areas of vasospasm during intervention noted in the distal right and posterior lateral artery. Both resolved with intracoronary nitroglycerin.  EKG:  EKG personally reviewed.  02/12/2022: EKG was not ordered. 11/06/2021 Richardson Dopp, PA - C): Normal sinus rythym at 78 bpm, possible left atrial enlargement , and septal infract, age undetermined   Recent Labs: 04/05/2021: ALT 14 10/30/2021: BUN 15; Creatinine, Ser 1.21; Hemoglobin 15.9; Platelets 229; Potassium 4.6; Sodium 137   Recent Lipid Panel    Component Value Date/Time   CHOL 150 12/05/2021 1016   TRIG 138 12/05/2021 1016   HDL 39 (L) 12/05/2021 1016   CHOLHDL 3.8 12/05/2021 1016   CHOLHDL 3.1  02/13/2016 1540   VLDL 21 02/13/2016 1540   LDLCALC 87 12/05/2021 1016     Risk Assessment/Calculations:       Physical Exam:    VS:  BP 128/74   Pulse 66   Ht '5\' 8"'$  (1.727 m)   Wt 213 lb (96.6 kg)   SpO2 98%   BMI 32.39 kg/m     Wt Readings from Last 3 Encounters:  02/12/22 213 lb (96.6 kg)  12/05/21 220 lb (99.8 kg)  12/04/21 218 lb 12.8 oz (99.2 kg)     GEN:  Well nourished, well developed in no acute distress HEENT: Normal NECK: No JVD; No carotid bruits CARDIAC: RRR, no murmurs, rubs, gallops RESPIRATORY:  Clear to auscultation without rales, wheezing or  rhonchi  ABDOMEN: Soft, non-tender, non-distended MUSCULOSKELETAL: Chronic venous stasis changes SKIN: Warm and dry NEUROLOGIC:  Alert and oriented x 3 PSYCHIATRIC:  Normal affect   ASSESSMENT:    1. Coronary artery disease involving native coronary artery of native heart without angina pectoris   2. Hyperlipidemia LDL goal <70   3. Mixed hyperlipidemia   4. Hyperlipidemia, unspecified hyperlipidemia type   5. Medication management   6. Essential hypertension, benign   7. Chronic diastolic (congestive) heart failure (HCC)   8. CKD (chronic kidney disease) stage 2, GFR 60-89 ml/min   9. Dizziness     PLAN:    In order of problems listed above:  #Severe multivessel CAD s/p CABG Patient with history of NSTEMI in in 2015 s/p DES to mid-RCA with known residual moderate LAD disease. Presented to clinic with exertional chest pain in 07/2020 found to have multivessel CAD s/p 4v CABG on 07/2020 with L-LAD, S-PDA, S-OM1, S-OM2. Currently, doing well without anginal symptoms. -Continue plavix '75mg'$  daily -Continue aspirin '81mg'$  -Continue carvedilol 9.'375mg'$  BID -Continue atorvastatin '80mg'$  daily  #Frequent PVCs: Noted to have 6.4% burden on Zio monitor. Coreg has since been increased. -Continue carvedilol 9.'375mg'$  BID  #Dizziness: Suspect related to orthostasis due to episodes occurring while standing in the heat or at church. Reassuring work-up and BP well controlled with no episodes of lows. MRI head without acute pathology. Carotids with no significant disease. Zio 11/2021 with frequent PVCs (6.4%) but no sustained arrhythmias. Symptoms have significantly improved with increased hydration.  #Essential hypertension: Well controlled and at goal <130/90s. -Continue carvedilol 9.'375mg'$  BID  #Chronic diastolic CHF: Stable and euvolemic on examination with NYHA class I symptoms -Continue lasix '20mg'$  daily -Continue carvedilol 9.'375mg'$  BID -Continue spiro 12.'5mg'$  daily -Low Na  diet  #CKD: Stable. -Follow-up with PCP as scheduled  #HLD: -Continue atorvastatin '80mg'$  as above -Repeat lipids to ensure LDL at goal (was not taking lipitor consistently on last check)  Follow Up: 6 months  Medication Adjustments/Labs and Tests Ordered: Current medicines are reviewed at length with the patient today.  Concerns regarding medicines are outlined above.  Orders Placed This Encounter  Procedures   Lipid Profile   No orders of the defined types were placed in this encounter.  Patient Instructions  Medication Instructions:   Your physician recommends that you continue on your current medications as directed. Please refer to the Current Medication list given to you today.  *If you need a refill on your cardiac medications before your next appointment, please call your pharmacy*   Lab Work:  SOMETIME THIS WEEK HERE IN THE OFFICE--LIPIDS--PLEASE COME FASTING TO THIS LAB APPOINTMENT  If you have labs (blood work) drawn today and your tests are completely normal, you will receive  your results only by: Coffee (if you have MyChart) OR A paper copy in the mail If you have any lab test that is abnormal or we need to change your treatment, we will call you to review the results.   Follow-Up: At [facility], you and your health needs are our priority.  As part of our continuing mission to provide you with exceptional heart care, we have created designated Provider Care Teams.  These Care Teams include your primary Cardiologist (physician) and Advanced Practice Providers (APPs -  Physician Assistants and Nurse Practitioners) who all work together to provide you with the care you need, when you need it.  We recommend signing up for the patient portal called "MyChart".  Sign up information is provided on this After Visit Summary.  MyChart is used to connect with patients for Virtual Visits (Telemedicine).  Patients are able to view lab/test results, encounter  notes, upcoming appointments, etc.  Non-urgent messages can be sent to your provider as well.   To learn more about what you can do with MyChart, go to [email or web address].    Your next appointment:   6 month(s)  The format for your next appointment:   In Person  Provider:   Robbie Lis, PA-C, Nicholes Rough, PA-C, Melina Copa, PA-C, Ambrose Pancoast, NP, Cecilie Kicks, NP, Ermalinda Barrios, PA-C, Christen Bame, NP, or Richardson Dopp, PA-C          Important Information About Sugar        I,Mathew Stumpf,acting as a scribe for Freada Bergeron, MD.,have documented all relevant documentation on the behalf of Freada Bergeron, MD,as directed by  Freada Bergeron, MD while in the presence of Freada Bergeron, MD.  I, Freada Bergeron, MD, have reviewed all documentation for this visit. The documentation on 02/12/22 for the exam, diagnosis, procedures, and orders are all accurate and complete.   Signed, Freada Bergeron, MD  02/12/2022 5:00 PM    [place]

## 2022-02-14 ENCOUNTER — Other Ambulatory Visit: Payer: No Typology Code available for payment source

## 2022-02-19 IMAGING — DX DG CHEST 2V
2 series · 2 of 2 positions shown · non-contrast
Comparison: 08/11/2020

CLINICAL DATA: Follow-up CABG.

EXAM:
CHEST - 2 VIEW

[dg chest 2 view (1 of 2)]
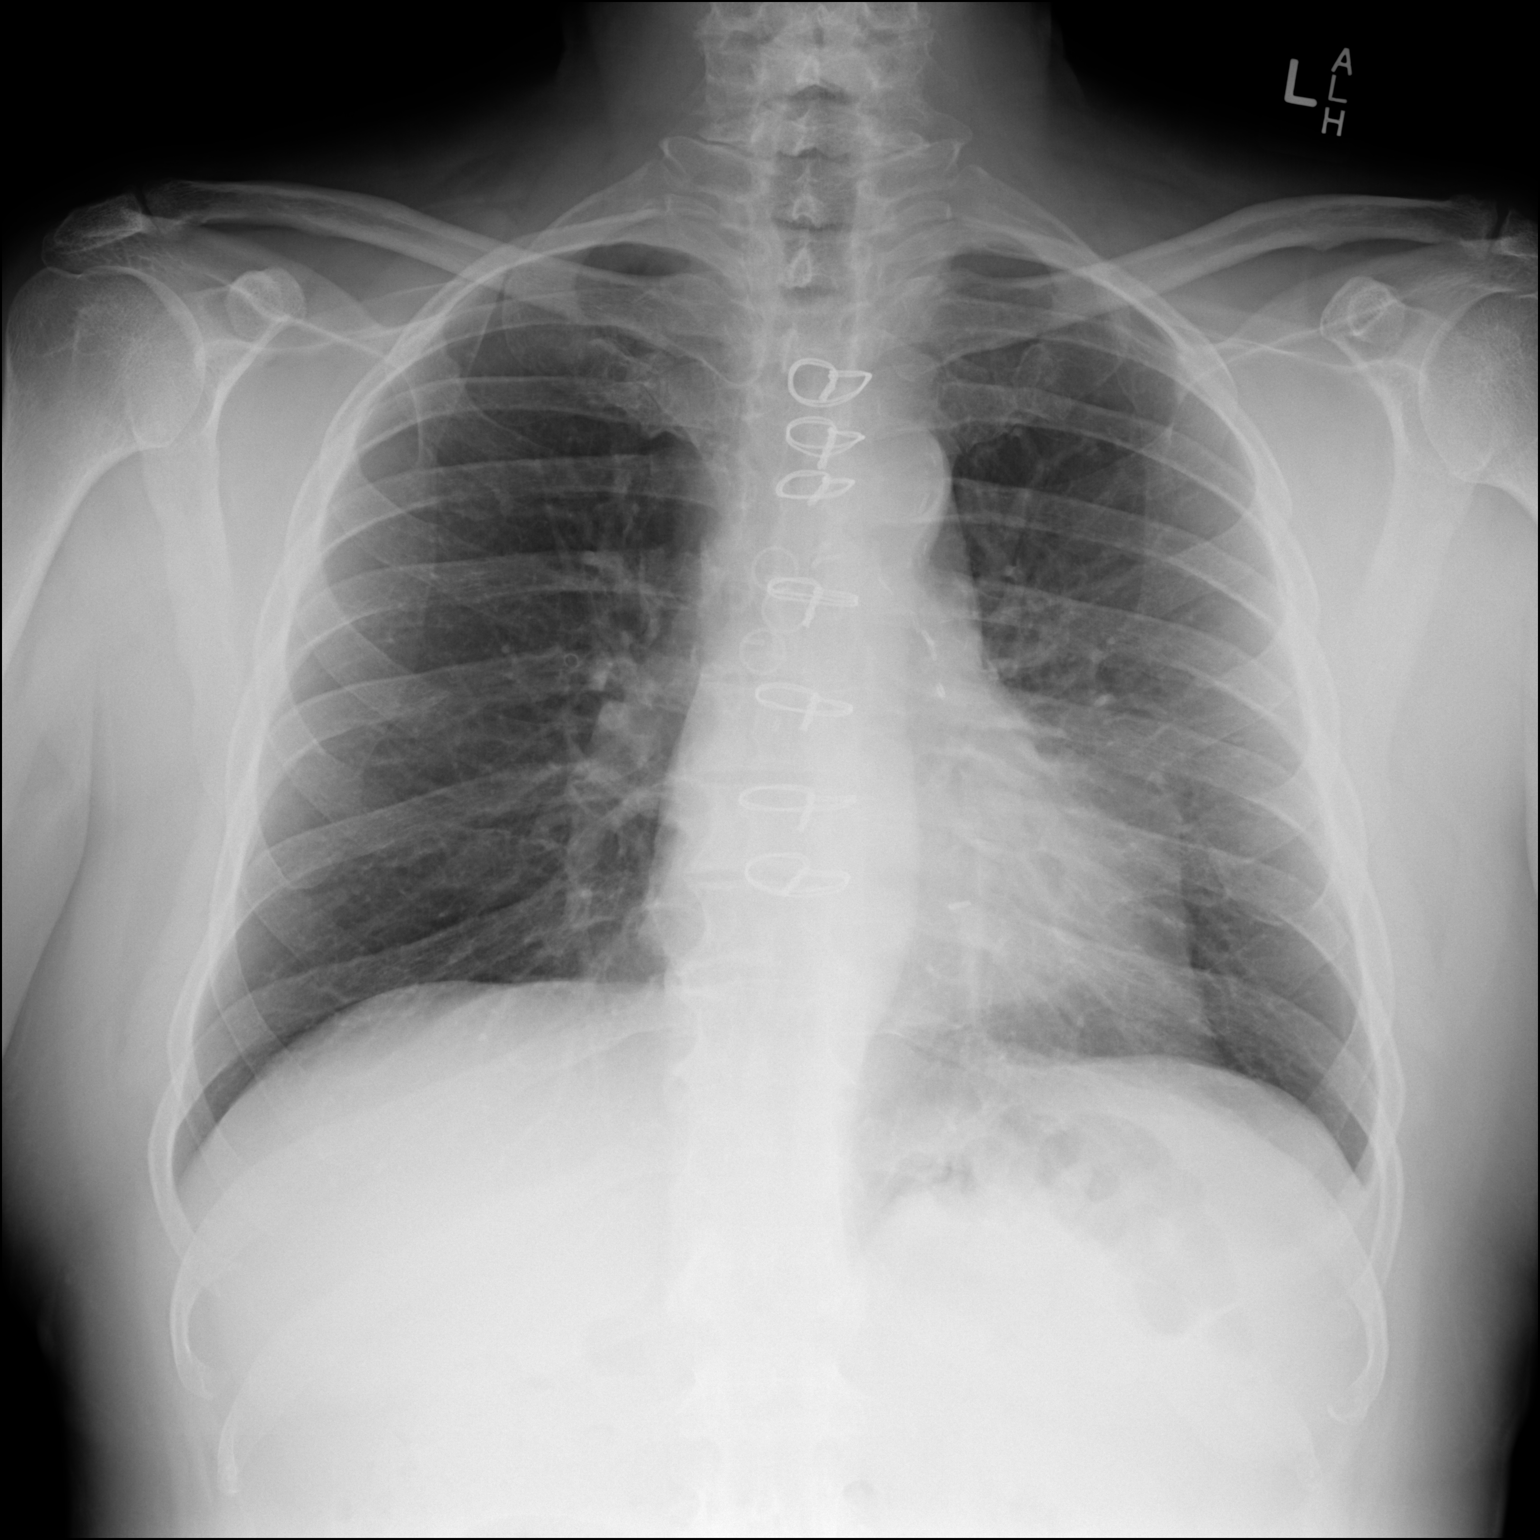

[dg chest 2 view (2 of 2)]
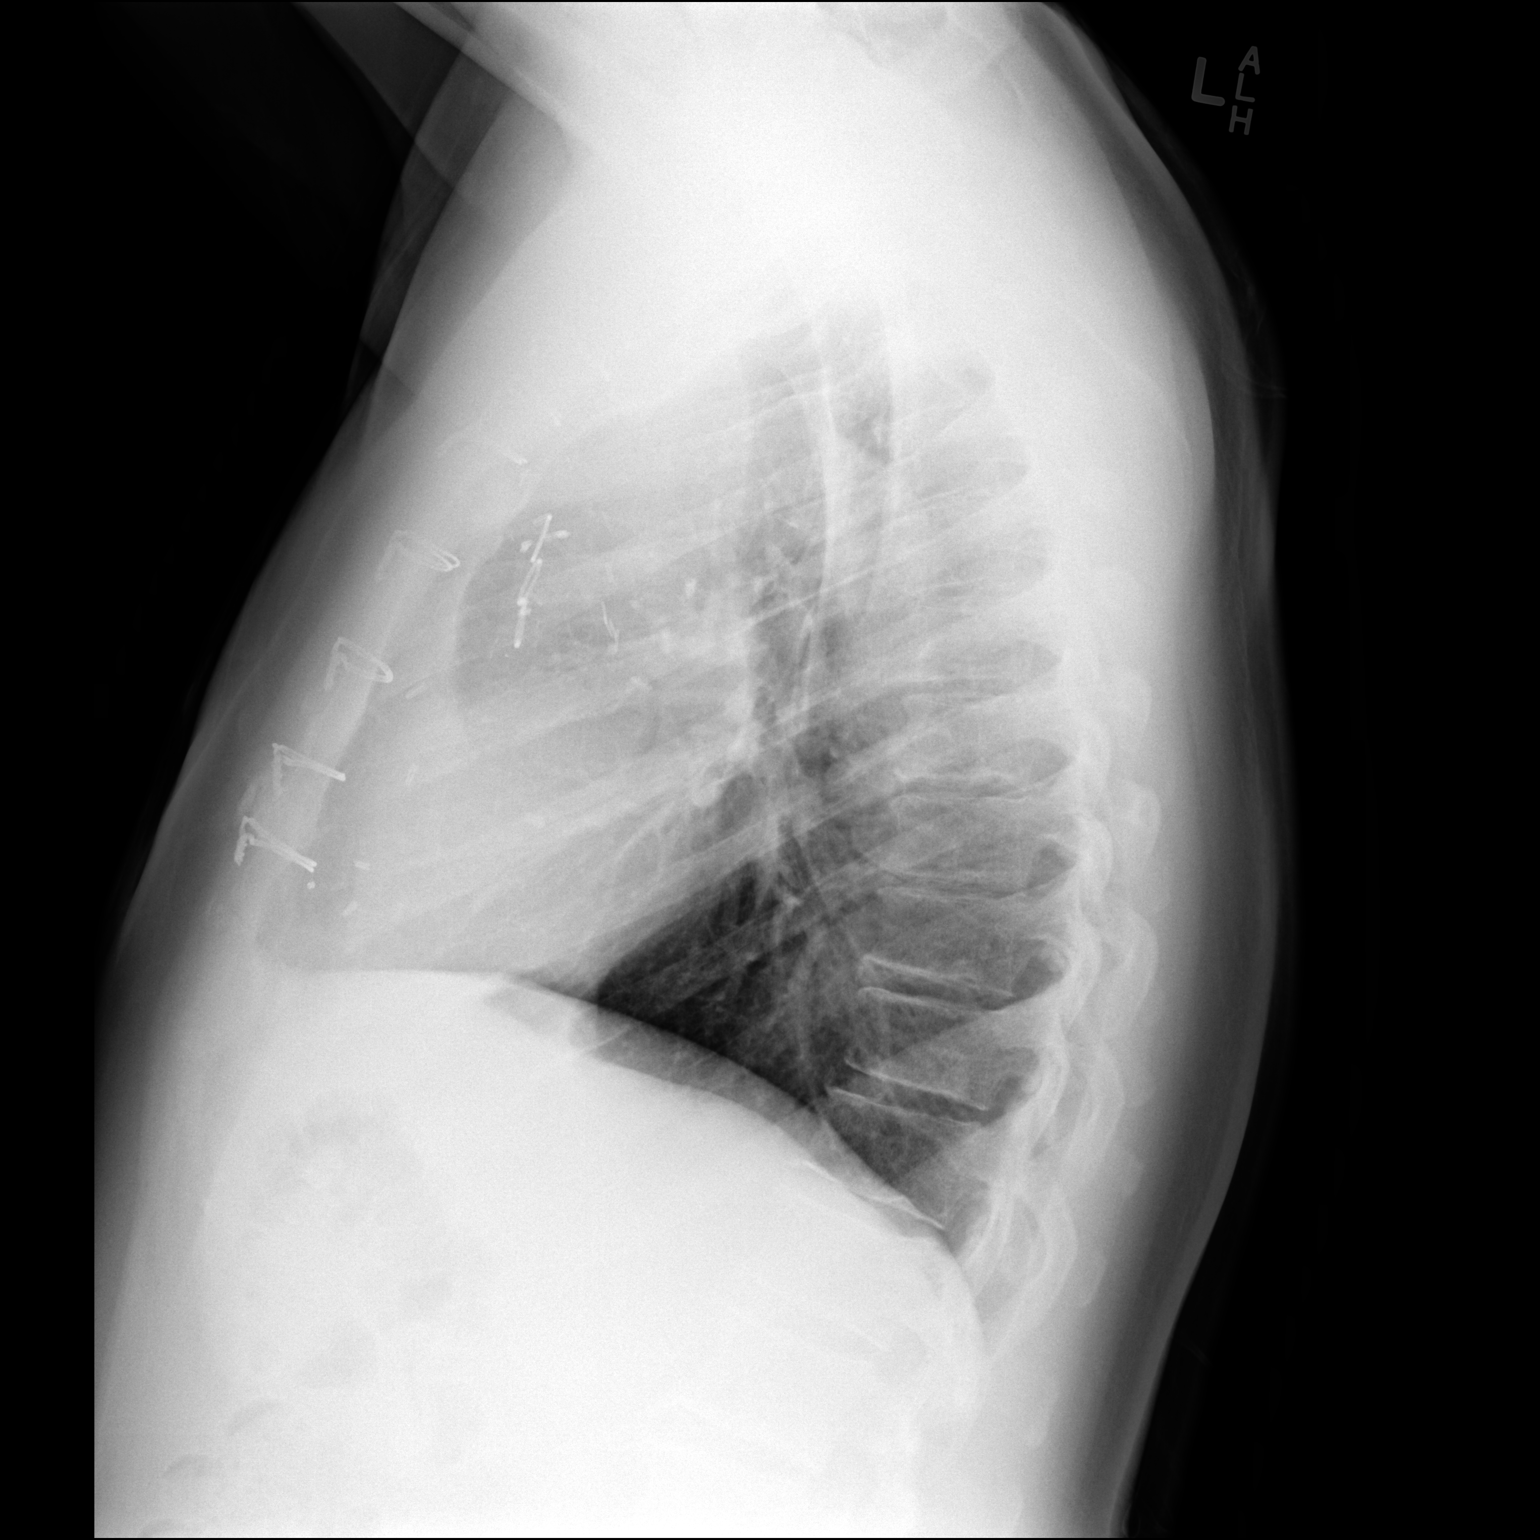

[2 of 2 positions shown; findings below may reference images not displayed]

FINDINGS: Improved aeration. Resolved pleural fluid and no pneumothorax.
Normal heart size and aortic contours after CABG.
IMPRESSION: Resolved postoperative changes.

## 2022-02-20 ENCOUNTER — Ambulatory Visit: Payer: No Typology Code available for payment source | Attending: Cardiology

## 2022-02-20 DIAGNOSIS — E782 Mixed hyperlipidemia: Secondary | ICD-10-CM

## 2022-02-20 DIAGNOSIS — E785 Hyperlipidemia, unspecified: Secondary | ICD-10-CM | POA: Diagnosis not present

## 2022-02-20 DIAGNOSIS — I2510 Atherosclerotic heart disease of native coronary artery without angina pectoris: Secondary | ICD-10-CM | POA: Diagnosis not present

## 2022-02-20 DIAGNOSIS — Z79899 Other long term (current) drug therapy: Secondary | ICD-10-CM | POA: Diagnosis not present

## 2022-02-21 LAB — LIPID PANEL
Chol/HDL Ratio: 2.4 ratio (ref 0.0–5.0)
Cholesterol, Total: 93 mg/dL — ABNORMAL LOW (ref 100–199)
HDL: 39 mg/dL — ABNORMAL LOW (ref >39)
LDL Chol Calc (NIH): 36 mg/dL (ref 0–99)
Triglycerides: 91 mg/dL (ref 0–149)
VLDL Cholesterol Cal: 18 mg/dL (ref 5–40)

## 2022-04-23 DIAGNOSIS — E669 Obesity, unspecified: Secondary | ICD-10-CM | POA: Diagnosis not present

## 2022-04-23 DIAGNOSIS — I25119 Atherosclerotic heart disease of native coronary artery with unspecified angina pectoris: Secondary | ICD-10-CM | POA: Diagnosis not present

## 2022-04-23 DIAGNOSIS — Z6832 Body mass index (BMI) 32.0-32.9, adult: Secondary | ICD-10-CM | POA: Diagnosis not present

## 2022-04-23 DIAGNOSIS — I129 Hypertensive chronic kidney disease with stage 1 through stage 4 chronic kidney disease, or unspecified chronic kidney disease: Secondary | ICD-10-CM | POA: Diagnosis not present

## 2022-04-23 DIAGNOSIS — Z86010 Personal history of colonic polyps: Secondary | ICD-10-CM | POA: Diagnosis not present

## 2022-04-23 DIAGNOSIS — N1831 Chronic kidney disease, stage 3a: Secondary | ICD-10-CM | POA: Diagnosis not present

## 2022-04-23 DIAGNOSIS — E261 Secondary hyperaldosteronism: Secondary | ICD-10-CM | POA: Diagnosis not present

## 2022-04-23 DIAGNOSIS — I509 Heart failure, unspecified: Secondary | ICD-10-CM | POA: Diagnosis not present

## 2022-04-23 DIAGNOSIS — Z008 Encounter for other general examination: Secondary | ICD-10-CM | POA: Diagnosis not present

## 2022-04-23 DIAGNOSIS — F17211 Nicotine dependence, cigarettes, in remission: Secondary | ICD-10-CM | POA: Diagnosis not present

## 2022-05-04 ENCOUNTER — Telehealth: Payer: Self-pay | Admitting: Cardiology

## 2022-05-04 ENCOUNTER — Encounter: Payer: Self-pay | Admitting: Student

## 2022-05-04 ENCOUNTER — Ambulatory Visit (INDEPENDENT_AMBULATORY_CARE_PROVIDER_SITE_OTHER): Payer: No Typology Code available for payment source | Admitting: Student

## 2022-05-04 VITALS — BP 104/72 | HR 81 | Ht 68.0 in | Wt 212.8 lb

## 2022-05-04 DIAGNOSIS — J069 Acute upper respiratory infection, unspecified: Secondary | ICD-10-CM

## 2022-05-04 MED ORDER — BENZONATATE 100 MG PO CAPS: 100.0000 mg | ORAL_CAPSULE | Freq: Two times a day (BID) | ORAL | 0 refills | Status: DC | PRN

## 2022-05-04 NOTE — Telephone Encounter (Signed)
  Pt c/o of Chest Pain: STAT if CP now or developed within 24 hours  1. Are you having CP right now? On   2. Are you experiencing any other symptoms (ex. SOB, nausea, vomiting, sweating)? None   3. How long have you been experiencing CP? 12/12  4. Is your CP continuous or coming and going? Coming and going  5. Have you taken Nitroglycerin? No   Pt said, he started to feel sick last 12/12 he saw his pcp yesterday and was told he have the flu and given medication but he also feeling some chest pain and not sure if ts because of his flu, he was told by pcp that there's nothing wrong in his heart but he has not been feeling good and call Dr. [name]  ?

## 2022-05-04 NOTE — Patient Instructions (Addendum)
It was wonderful to meet you today. Thank you for allowing me to be a part of your care. Below is a short summary of what we discussed at your visit today:  Your symptoms are consistent with upper respiratory viral illness.  I have sent in prescription for Tessalon Pearls for your cough which you will take twice daily as needed.  Also you can use over-the-counter Metamucil for your diarrhea.  Please make sure you stay hydrated, drink water and Gatorade  Please bring all of your medications to every appointment!  If you have any questions or concerns, please do not hesitate to contact us via phone or MyChart message.   Alen Bleacher, MD [facility]

## 2022-05-04 NOTE — Telephone Encounter (Signed)
Called patient regarding report of chest pain with recent flu symptoms. Patient started to feel sick 12/12 and states he had chest pain during a coughing episode last night. Patient saw PCP today who diagnosed him with flu and prescribed tessalon pearls. Patient denies chest pain at this time and states he had been sleeping since he had originally called in. He states he has not been needing nitro to help his chest pain. He states his breathing is fine and he has no leg swelling.   Patient very anxious that flu symptoms will get worse over the weekend or chest pain will return. Advised patient to try Urgent Care tomorrow if needed for worsening flu symptoms. Advised patient that he can always use his nitro if he experiences chest pain that does not get better with rest. If chest pain continues to escalate despite 3 doses of nitro, he needs to call EMS. He can also call EMS if he feels his breathing is rapidly deteriorating. Patient verbalized understanding.

## 2022-05-04 NOTE — Progress Notes (Cosign Needed Addendum)
    SUBJECTIVE:   CHIEF COMPLAINT / HPI:   Per patient symptom started with headache and cough 3 days ago.  Reports no fever but haven't checked his temperature.  Has tried tylenol, honey and lemon but no improvement  Associated symptoms include sore throat and running nose. He has had decreased appetite but drinking adequately. Reports non bloody diarrhea but denies any vomiting. No known recent sick contact. Patient hasn't received a COVID vaccine.   PERTINENT  PMH / PSH: Reviewed   OBJECTIVE:   BP 104/72   Pulse 81   Wt 212 lb 12.8 oz (96.5 kg)   SpO2 98%   BMI 32.36 kg/m    Physical Exam General: Alert, well appearing, NAD HEENT: Mild oropharyngeal erythema, no tonsillar exudate or cervical lymphadenopathy. Cardiovascular: RRR, No Murmurs, well-perfused Respiratory: CTAB, No wheezing or Rales Abdomen: No distension or tenderness  ASSESSMENT/PLAN:   Viral illness 61 year old male presents with  cough, congestion, sore throat and diarrhea. He is afebrile today and on exam has oropharyngeal erythema,  good work of breathing on RA and clear breath sounds bilaterally. Overall presentation and exam is consistent with viral URI.  -Discussed conservative management  -Recommended tylenol or ibuprofen for fever. -Encouraged adequate hydration for patient.  -Advised patient to wear his mask in public settings -Recommend over-the-counter Metamucil for diarrhea -Rx Tessalon Pearl for cough -Outline signs and symptoms that will warrant ED visit or return for further assessment.       Alen Bleacher, MD [place]

## 2022-06-18 ENCOUNTER — Other Ambulatory Visit: Payer: Self-pay | Admitting: *Deleted

## 2022-06-18 DIAGNOSIS — I5032 Chronic diastolic (congestive) heart failure: Secondary | ICD-10-CM

## 2022-06-18 DIAGNOSIS — I2510 Atherosclerotic heart disease of native coronary artery without angina pectoris: Secondary | ICD-10-CM

## 2022-06-18 DIAGNOSIS — E782 Mixed hyperlipidemia: Secondary | ICD-10-CM

## 2022-06-18 DIAGNOSIS — I10 Essential (primary) hypertension: Secondary | ICD-10-CM

## 2022-06-18 MED ORDER — ATORVASTATIN CALCIUM 80 MG PO TABS: 80.0000 mg | ORAL_TABLET | Freq: Every evening | ORAL | 2 refills | Status: DC

## 2022-06-18 MED ORDER — CLOPIDOGREL BISULFATE 75 MG PO TABS: 75.0000 mg | ORAL_TABLET | Freq: Every day | ORAL | 2 refills | Status: DC

## 2022-07-16 ENCOUNTER — Other Ambulatory Visit: Payer: Self-pay

## 2022-07-16 ENCOUNTER — Other Ambulatory Visit: Payer: Self-pay | Admitting: *Deleted

## 2022-07-16 MED ORDER — SPIRONOLACTONE 25 MG PO TABS: 12.5000 mg | ORAL_TABLET | Freq: Every day | ORAL | 1 refills | Status: DC

## 2022-07-16 MED ORDER — SPIRONOLACTONE 25 MG PO TABS: 12.5000 mg | ORAL_TABLET | Freq: Every day | ORAL | 3 refills | Status: DC

## 2022-08-07 ENCOUNTER — Telehealth: Payer: Self-pay | Admitting: Student

## 2022-08-07 NOTE — Telephone Encounter (Signed)
Contacted [name] to schedule their annual wellness visit. Appointment made for 08/13/2022.  Thank you,  Craig Direct dial  [phone]

## 2022-08-10 NOTE — Progress Notes (Unsigned)
Cardiology Office Note:    Date:  08/13/2022   ID:  Lonnie Skinner, DOB 1961/02/01, MRN [id number]  PCP:  Alen Bleacher, MD   [place]  Cardiologist:  Freada Bergeron, MD  Advanced Practice Provider:  No care team member to display Electrophysiologist:  None    Referring MD: Alen Bleacher, MD    History of Present Illness:    Lonnie Skinner is a 62 y.o. male with a hx of CAD/NSTEMI s/p DES to [facility] with residual moderate LAD disease in [id number], chronic diastolic heart failure, HTN, HLD, CKD who was previously followed seen by Dr. [name] who now returns to clinic for follow-up.  Admitted back in early 2015 with CAD/NSTEMI - s/p DES to the mRCA, residual moderate LAD disease (if fails on medical therapy would proceed with PCI). He has typically not followed CV risk factor modification.   Was seen by me in clinic on 07/27/20 where he was having chest pain on exertion. Cath 08/02/20 showed critical 95% prox LAD, 70% OM1, 60% OM2, 70% prox RCA with abnormal FFR. He subsequently underwent CABG 07/2020 with LIMA-LAD, S-PDA, S-OM1, S-OM2 with Dr. Kipp Brood. TTE 08/12/20 with LVEF 60-65%, no WMA.   Was seen on 10/2021 by Richardson Dopp for dizziness. Recommended for zio monitor which is pending. Carotid ultrasound without significant disease. MRI brain without significant findings.  Was last seen in 01/2022 where he was doing well from a CV standpoint.  Today, the patient feels well. No chest pain, SOB, orthopnea, LE edema, or palpitations. Dizziness has significantly improved with decreasing lasix and increasing fluid intake.     Past Medical History:  Diagnosis Date   CAD (coronary artery disease)    a. 05/2013: NSTEMI s/p DES to mRCA (culprit vessel), residual moderate LAD disease (the patient fails medical therapy and this was found to be significant, this would be amenable to PCI).   CHF (congestive heart failure) (HCC)    CKD (chronic kidney disease), stage II     DVT (deep venous thrombosis) ([place]) 1981   Hypercholesteremia    Hyperglycemia    Hypertension     Past Surgical History:  Procedure Laterality Date   CORONARY ARTERY BYPASS GRAFT N/A 08/09/2020   Procedure: CORONARY ARTERY BYPASS GRAFTING (CABG) X  FOUR   USING  LEFT INTERNAL MAMMARY ARTERY HARVEST AND RIGHT AND LEFT ENDOSCOPIC SAPHENOUS VEIN HARVEST;  Surgeon: [name], MD;  Location: [place];  Service: Open Heart Surgery;  Laterality: N/A;   CORONARY STENT PLACEMENT  05/2013   mRCA   CORONARY/GRAFT ACUTE MI REVASCULARIZATION  2015   INTRAVASCULAR PRESSURE WIRE/FFR STUDY N/A 08/02/2020   Procedure: INTRAVASCULAR PRESSURE WIRE/FFR STUDY;  Surgeon: [name], [name], MD;  Location: [place] CV LAB;  Service: Cardiovascular;  Laterality: N/A;   KNEE SURGERY Left 2009   LEFT HEART CATH AND CORONARY ANGIOGRAPHY N/A 08/02/2020   Procedure: LEFT HEART CATH AND CORONARY ANGIOGRAPHY;  Surgeon: [name], [name], MD;  Location: [place] CV LAB;  Service: Cardiovascular;  Laterality: N/A;   LEFT HEART CATHETERIZATION WITH CORONARY ANGIOGRAM N/A 06/02/2013   Procedure: LEFT HEART CATHETERIZATION WITH CORONARY ANGIOGRAM;  Surgeon: [name], MD;  Location: [facility] CATH LAB;  Service: Cardiovascular;  Laterality: N/A;   PERCUTANEOUS CORONARY STENT INTERVENTION (PCI-S)  06/02/2013   Procedure: PERCUTANEOUS CORONARY STENT INTERVENTION (PCI-S);  Surgeon: [name], MD;  Location: [facility] CATH LAB;  Service: Cardiovascular;;   TEE WITHOUT CARDIOVERSION N/A 08/09/2020  Procedure: TRANSESOPHAGEAL ECHOCARDIOGRAM (TEE);  Surgeon: [name], MD;  Location: [place];  Service: Open Heart Surgery;  Laterality: N/A;    Current Medications: Current Meds  Medication Sig   ALPRAZolam (XANAX) 0.25 MG tablet TAKE 1 TABLET(0.25 MG) BY MOUTH TWICE DAILY AS NEEDED FOR ANXIETY   aspirin 81 MG EC tablet Take 1 tablet (81 mg total) by mouth daily.   benzonatate (TESSALON) 100 MG capsule Take 1  capsule (100 mg total) by mouth 2 (two) times daily as needed for cough.   nitroGLYCERIN (NITROSTAT) 0.4 MG SL tablet Place 1 tablet (0.4 mg total) under the tongue every 5 (five) minutes as needed for chest pain (up to 3 doses).   traMADol (ULTRAM) 50 MG tablet Take 1 tablet (50 mg total) by mouth every 4 (four) hours as needed for moderate pain.   [DISCONTINUED] atorvastatin (LIPITOR) 80 MG tablet Take 1 tablet (80 mg total) by mouth every evening.   [DISCONTINUED] carvedilol (COREG) 6.25 MG tablet Take 1.5 tablets (9.375 mg total) by mouth 2 (two) times daily.   [DISCONTINUED] clopidogrel (PLAVIX) 75 MG tablet Take 1 tablet (75 mg total) by mouth daily.   [DISCONTINUED] furosemide (LASIX) 20 MG tablet Take 1 tablet (20 mg total) by mouth daily.   [DISCONTINUED] spironolactone (ALDACTONE) 25 MG tablet Take 0.5 tablets (12.5 mg total) by mouth daily.     Allergies:   Penicillins   Social History   Socioeconomic History   Marital status: Widowed    Spouse name: Not on file   Number of children: 2   Years of education: 45   Highest education level: 11th grade  Occupational History    Comment: Disabled  Tobacco Use   Smoking status: Former    Types: Cigarettes    Quit date: 2007    Years since quitting: 17.2    Passive exposure: Past   Smokeless tobacco: Never  Vaping Use   Vaping Use: Never used  Substance and Sexual Activity   Alcohol use: No   Drug use: No   Sexual activity: Not Currently    Birth control/protection: None  Other Topics Concern   Not on file  Social History Narrative   Patient lives alone in. Patients wife died ~1 year ago.    Patient has support from his son and some friends.    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    Patient recently graduated cardiac rehab and has been going to [employer] 3x per week for ~ 2 hours.    Social Determinants of Health   Financial Resource Strain: Low Risk  (07/13/2021)   Overall Financial Resource  Strain (CARDIA)    Difficulty of Paying Living Expenses: Not hard at all  Food Insecurity: No Food Insecurity (07/13/2021)   Hunger Vital Sign    Worried About Running Out of Food in the Last Year: Never true    Ran Out of Food in the Last Year: Never true  Transportation Needs: No Transportation Needs (07/13/2021)   PRAPARE - Hydrologist (Medical): No    Lack of Transportation (Non-Medical): No  Physical Activity: Sufficiently Active (07/13/2021)   Exercise Vital Sign    Days of Exercise per Week: 3 days    Minutes of Exercise per Session: 120 min  Stress: No Stress Concern Present (07/13/2021)   [place]    Feeling of Stress : Not at all  Social  Connections: Socially Isolated (07/13/2021)   Social Connection and Isolation Panel [NHANES]    Frequency of Communication with Friends and Family: More than three times a week    Frequency of Social Gatherings with Friends and Family: More than three times a week    Attends Religious Services: Never    Marine scientist or Organizations: No    Attends Archivist Meetings: Never    Marital Status: Widowed     Family History: The patient's family history includes Bone cancer in his father; CAD in an other family member; Diabetes in his mother; Heart attack in his brother and father; Heart disease in his brother; Heart failure in his father. There is no history of Colon cancer, Pancreatic cancer, Liver disease, or Esophageal cancer.  ROS:   Please see the history of present illness.    Review of Systems  Constitutional:  Negative for chills and fever.  HENT:  Negative for hearing loss.   Eyes:  Negative for blurred vision and redness.  Respiratory:  Negative for shortness of breath.   Cardiovascular:  Negative for chest pain, palpitations, orthopnea, claudication, leg swelling and PND.  Gastrointestinal:  Negative for melena, nausea  and vomiting.  Genitourinary:  Negative for dysuria and flank pain.  Musculoskeletal:  Negative for myalgias.  Skin:  Negative for rash.  Neurological:  Negative for dizziness and loss of consciousness.  Endo/Heme/Allergies:  Negative for polydipsia.  Psychiatric/Behavioral:  Negative for depression. The patient is not nervous/anxious.     EKGs/Labs/Other Studies Reviewed:    The following studies were reviewed today: Monitor 11/2021:    Predominant rhythm was 6 days and 12 hours   Predominant rhythm was NSR with average HR 72bpm   There were 11 runs of SVT with longest lasting 9 beats   Frequent VE (6.4%), rare SVE (<1%)   Patient triggered event correlates with VE   No sustained arrhythmias or significant pauses     Patch Wear Time:  6 days and 12 hours (2023-07-07T10:42:58-0400 to 2023-07-13T22:48:15-0400)   Patient had a min HR of 51 bpm, max HR of 129 bpm, and avg HR of 72 bpm. Predominant underlying rhythm was Sinus Rhythm. 11 Supraventricular Tachycardia runs occurred, the run with the fastest interval lasting 9 beats with a max rate of 129 bpm (avg 116  bpm); the run with the fastest interval was also the longest. Isolated SVEs were rare (<1.0%), SVE Couplets were rare (<1.0%), and SVE Triplets were rare (<1.0%). Isolated VEs were frequent (6.4%, [id number]), VE Couplets were rare (<1.0%, 2040), and VE  Triplets were rare (<1.0%, 2). Ventricular Bigeminy and Trigeminy were present.  Myocardial Perfusion Imaging 11/13/2021   The study is normal. The study is low risk.   No ST deviation was noted.   LV perfusion is normal. There is no evidence of ischemia. There is no evidence of infarction.   Left ventricular function is normal. Nuclear stress EF: 56 %. The left ventricular ejection fraction is normal (55-65%). End diastolic cavity size is normal. End systolic cavity size is normal.   Prior study not available for comparison.   Normal stress nuclear study with no ischemia or  infarction.  Gated ejection fraction 56% with normal wall motion.  Carotid Ultrasound 10/2021: Summary:  Right Carotid: The extracranial vessels were near-normal with only minimal  wall                 thickening or plaque.   Left Carotid: The extracranial vessels were  near-normal with only minimal  wall                thickening or plaque.   Vertebrals:  Bilateral vertebral arteries demonstrate antegrade flow.  Subclavians: Normal flow hemodynamics were seen in bilateral subclavian               arteries.   Lower Extremity Venous Reflux 09/20/20 Summary:  Right:  - No evidence of acute deep vein thrombosis seen in the right lower extremity, from the common femoral through the popliteal veins.  - Venous reflux is noted in the right common femoral vein.  - Venous reflux is noted in the right sapheno-femoral junction.  - Venous reflux is noted in the right greater saphenous vein in the proximal thigh.  - Venous reflux is noted in the right short saphenous vein, fossa area.     Left:  - No evidence of acute deep vein thrombosis seen in the left lower extremity, from the common femoral through the popliteal veins.  - No evidence of superficial venous reflux seen in the left greater saphenous vein.  - No evidence of superficial venous reflux seen in the left short saphenous vein.  - Venous reflux is noted in the left common femoral vein.  - Venous reflux is noted in the left sapheno-femoral junction.   Echo TEE 08/09/20 PRE-OP FINDINGS   Left Ventricle: The left ventricle has normal systolic function, with an ejection fraction of 55-60%. The cavity size was normal. There is no increase in left ventricular wall thickness.   Right Ventricle: The right ventricle has normal systolic function. The cavity was normal. There is no increase in right ventricular wall thickness.   Left Atrium: Left atrial size was not assessed. No left atrial/left atrial appendage thrombus was detected.   Right  Atrium: Right atrial size was not assessed.   Interatrial Septum: The interatrial septum was not assessed.   Pericardium: A small pericardial effusion is present. The pericardial effusion is localized near the right ventricle.   Mitral Valve: The mitral valve is normal in structure. Mitral valve regurgitation is trivial by color flow Doppler.   Tricuspid Valve: The tricuspid valve was normal in structure. Tricuspid valve regurgitation is trivial by color flow Doppler.   Aortic Valve: The aortic valve is bicuspid Aortic valve regurgitation was not visualized by color flow Doppler. There is no stenosis of the aortic valve.   Pulmonic Valve: The pulmonic valve was normal in structure. Pulmonic valve regurgitation is trivial by color flow Doppler.    Cath 08/02/20: Prox LAD lesion is 95% stenosed. Prox LAD to Mid LAD lesion is 80% stenosed. 1st Mrg lesion is 75% stenosed. 2nd Mrg lesion is 60% stenosed. Prox RCA to Mid RCA lesion is 70% stenosed. Previously placed Mid RCA-1 stent (unknown type) is widely patent. Mid RCA-2 lesion is 50% stenosed. The left ventricular systolic function is normal. LV end diastolic pressure is mildly elevated. The left ventricular ejection fraction is 55-65% by visual estimate.   1. 3 vessel obstructive CAD    - critical 95% proximal LAD    - 70% OM1    - 60% OM2    - 70% mid RCA proximal to prior stent with abnormal RFR 2. Normal LV function 3. Mildly elevated LVEDP   Plan: recommend CABG. Will hold Plavix. Admit to telemetry and treat with IV heparin. Check Echo.   TTE 08/02/20: IMPRESSIONS   1. Left ventricular ejection fraction, by estimation, is 60 to 65%. The left  ventricle has normal function. The left ventricle has no regional wall motion abnormalities. Left ventricular diastolic parameters were normal.   2. Right ventricular systolic function is normal. The right ventricular size is normal. Tricuspid regurgitation signal is inadequate for  assessing PA pressure.   3. The mitral valve is normal in structure. No evidence of mitral valve regurgitation. No evidence of mitral stenosis.   4. The aortic valve is normal in structure. Aortic valve regurgitation is not visualized. No aortic stenosis is present.   5. The inferior vena cava is normal in size with greater than 50% respiratory variability, suggesting right atrial pressure of 3 mmHg.   06/18/13  ECHO Study Conclusions  Left ventricle: The cavity size was normal. There was mild focal basal hypertrophy of the septum. Systolic function was normal. The estimated ejection fraction was in the range of 55% to 60%. Wall motion was normal; there were no regional wall motion abnormalities. Features are consistent with a pseudonormal left ventricular filling pattern, with concomitant abnormal relaxation and increased filling pressure (grade 2 diastolic dysfunction).  Impressions:  - No prior study for comparison.  ANGIOGRAPHIC DATA: The left main coronary artery is absent. There appear to be separate ostia of the LAD and circumflex.  The left anterior descending artery is a large vessel which reaches the apex. In the mid vessel, there is a 50-70% lesion in the mid LAD. There is mild to moderate diffuse disease. There 2 large diagonals which are widely patent. The apical LAD is small but patent.  The left circumflex artery is a medium size vessel. There appears to be some vasospasm proximally. There is a large first marginal which is patent. The second marginal is a larger vessel which branches across the lateral wall. This appears widely patent.  The right coronary artery is a large dominant vessel. In the mid vessel, there is a focal 99% stenosis with visible thrombus. The posterior lateral artery is very large and widely patent. The posterior descending artery is widely patent.  LEFT VENTRICULOGRAM: Left ventricular angiogram was not done. LVEDP was 13 mmHg.  PCI NARRATIVE: A JR 4  guiding catheters u used to engage the RCA. Angiomax was used for anticoagulation. Initially, a pro-water wire was advanced but would not cross the lesion. A Fielder XT density cross the lesion in the mid right coronary artery. A 2.5 x 12 balloon was used to predilate the lesion. A 2.75 x 16 promise drug-eluting stent was then deployed. The stent was post dilated with a 3.25 x 12 noncompliant balloon. There is an excellent angiographic result. There is no residual stenosis. There were several areas of vasospasm during intervention noted in the distal right and posterior lateral artery. Both resolved with intracoronary nitroglycerin.  EKG:  ECG shows NSR, septal infarct, HR 62  Recent Labs: 10/30/2021: BUN 15; Creatinine, Ser 1.21; Hemoglobin 15.9; Platelets 229; Potassium 4.6; Sodium 137   Recent Lipid Panel    Component Value Date/Time   CHOL 93 (L) 02/20/2022 0938   TRIG 91 02/20/2022 0938   HDL 39 (L) 02/20/2022 0938   CHOLHDL 2.4 02/20/2022 0938   CHOLHDL 3.1 02/13/2016 1540   VLDL 21 02/13/2016 1540   LDLCALC 36 02/20/2022 0938     Risk Assessment/Calculations:       Physical Exam:    VS:  BP 124/74   Pulse 61   Ht 5\' 8"  (1.727 m)   Wt 212 lb 6.4 oz (96.3 kg)   SpO2 98%   BMI 32.30  kg/m     Wt Readings from Last 3 Encounters:  08/13/22 212 lb 6.4 oz (96.3 kg)  05/04/22 212 lb 12.8 oz (96.5 kg)  02/12/22 213 lb (96.6 kg)     GEN:  Well nourished, well developed in no acute distress HEENT: Normal NECK: No JVD; No carotid bruits CARDIAC: Irregular, no murmurs RESPIRATORY:  Clear to auscultation without rales, wheezing or rhonchi  ABDOMEN: Soft, non-tender, non-distended MUSCULOSKELETAL: Chronic venous stasis changes SKIN: Warm and dry NEUROLOGIC:  Alert and oriented x 3 PSYCHIATRIC:  Normal affect   ASSESSMENT:    1. Coronary artery disease involving native coronary artery of native heart without angina pectoris   2. Chronic diastolic (congestive) heart failure  ([place])   3. Mixed hyperlipidemia   4. Essential hypertension, benign   5. Dizziness   6. Hyperlipidemia, unspecified hyperlipidemia type   7. Near syncope   8. Hyperlipidemia LDL goal <70      PLAN:    In order of problems listed above:  #Severe multivessel CAD s/p CABG Patient with history of NSTEMI in in 2015 s/p DES to mid-RCA with known residual moderate LAD disease. Presented to clinic with exertional chest pain in 07/2020 found to have multivessel CAD s/p 4v CABG on 07/2020 with L-LAD, S-PDA, S-OM1, S-OM2. Currently, doing well without anginal symptoms. -Continue plavix 75mg  daily -Continue aspirin 81mg  -Continue carvedilol 9.375mg  BID -Continue atorvastatin 80mg  daily  #Chronic diastolic CHF: Stable and euvolemic on examination with NYHA class I symptoms -Continue lasix 20mg  daily -Continue carvedilol 9.375mg  BID -Continue spiro 12.5mg  daily -Low Na diet -Check BMET today  #Frequent PVCs: Noted to have 6.4% burden on Zio monitor. Coreg has since been increased. -Continue carvedilol 9.375mg  BID  #Dizziness/Near Syncope: Suspect related to orthostasis due to episodes occurring while standing in the heat or at church. Reassuring work-up and BP well controlled with no episodes of lows. MRI head without acute pathology. Carotids with no significant disease. Zio 11/2021 with frequent PVCs (6.4%) but no sustained arrhythmias. Symptoms have significantly improved with increased hydration and cutting back on lasix.  #Essential hypertension: Well controlled and at goal <130/90s. -Continue carvedilol 9.375mg  BID  #CKD: Stable. -Follow-up with PCP as scheduled  #HLD: -Continue atorvastatin 80mg  as above -LDL controlled 36 on 02/2022  Follow Up: 6 months  Medication Adjustments/Labs and Tests Ordered: Current medicines are reviewed at length with the patient today.  Concerns regarding medicines are outlined above.  Orders Placed This Encounter  Procedures   Basic  metabolic panel   CBC   Meds ordered this encounter  Medications   spironolactone (ALDACTONE) 25 MG tablet    Sig: Take 0.5 tablets (12.5 mg total) by mouth daily.    Dispense:  45 tablet    Refill:  3   furosemide (LASIX) 20 MG tablet    Sig: Take 1 tablet (20 mg total) by mouth daily.    Dispense:  90 tablet    Refill:  3    Dose decrease   clopidogrel (PLAVIX) 75 MG tablet    Sig: Take 1 tablet (75 mg total) by mouth daily.    Dispense:  90 tablet    Refill:  3   carvedilol (COREG) 6.25 MG tablet    Sig: Take 1.5 tablets (9.375 mg total) by mouth 2 (two) times daily.    Dispense:  270 tablet    Refill:  3    DOSE CHANGE   atorvastatin (LIPITOR) 80 MG tablet    Sig: Take 1 tablet (  80 mg total) by mouth every evening.    Dispense:  90 tablet    Refill:  3   Patient Instructions  Medication Instructions:   Your physician recommends that you continue on your current medications as directed. Please refer to the Current Medication list given to you today.  *If you need a refill on your cardiac medications before your next appointment, please call your pharmacy*   Lab Work:  TODAY--BMET AND CBC  If you have labs (blood work) drawn today and your tests are completely normal, you will receive your results only by: [place] (if you have MyChart) OR A paper copy in the mail If you have any lab test that is abnormal or we need to change your treatment, we will call you to review the results.    Follow-Up: At [facility], you and your health needs are our priority.  As part of our continuing mission to provide you with exceptional heart care, we have created designated Provider Care Teams.  These Care Teams include your primary Cardiologist (physician) and Advanced Practice Providers (APPs -  Physician Assistants and Nurse Practitioners) who all work together to provide you with the care you need, when you need it.  We recommend signing up for the patient  portal called "MyChart".  Sign up information is provided on this After Visit Summary.  MyChart is used to connect with patients for Virtual Visits (Telemedicine).  Patients are able to view lab/test results, encounter notes, upcoming appointments, etc.  Non-urgent messages can be sent to your provider as well.   To learn more about what you can do with MyChart, go to [email or web address].    Your next appointment:   6 - 8 month(s)  Provider:   Freada Bergeron, MD          Signed, Freada Bergeron, MD  08/13/2022 4:00 PM    [place]

## 2022-08-13 ENCOUNTER — Encounter: Payer: Self-pay | Admitting: Cardiology

## 2022-08-13 ENCOUNTER — Ambulatory Visit: Payer: No Typology Code available for payment source | Attending: Cardiology | Admitting: Cardiology

## 2022-08-13 VITALS — BP 124/74 | HR 61 | Ht 68.0 in | Wt 212.4 lb

## 2022-08-13 DIAGNOSIS — R42 Dizziness and giddiness: Secondary | ICD-10-CM | POA: Diagnosis not present

## 2022-08-13 DIAGNOSIS — I10 Essential (primary) hypertension: Secondary | ICD-10-CM | POA: Diagnosis not present

## 2022-08-13 DIAGNOSIS — I2510 Atherosclerotic heart disease of native coronary artery without angina pectoris: Secondary | ICD-10-CM

## 2022-08-13 DIAGNOSIS — E785 Hyperlipidemia, unspecified: Secondary | ICD-10-CM | POA: Diagnosis not present

## 2022-08-13 DIAGNOSIS — E782 Mixed hyperlipidemia: Secondary | ICD-10-CM | POA: Diagnosis not present

## 2022-08-13 DIAGNOSIS — I5032 Chronic diastolic (congestive) heart failure: Secondary | ICD-10-CM | POA: Diagnosis not present

## 2022-08-13 DIAGNOSIS — R55 Syncope and collapse: Secondary | ICD-10-CM

## 2022-08-13 MED ORDER — FUROSEMIDE 20 MG PO TABS: 20.0000 mg | ORAL_TABLET | Freq: Every day | ORAL | 3 refills | Status: DC

## 2022-08-13 MED ORDER — SPIRONOLACTONE 25 MG PO TABS: 12.5000 mg | ORAL_TABLET | Freq: Every day | ORAL | 3 refills | Status: DC

## 2022-08-13 MED ORDER — ATORVASTATIN CALCIUM 80 MG PO TABS: 80.0000 mg | ORAL_TABLET | Freq: Every evening | ORAL | 3 refills | Status: DC

## 2022-08-13 MED ORDER — CLOPIDOGREL BISULFATE 75 MG PO TABS: 75.0000 mg | ORAL_TABLET | Freq: Every day | ORAL | 3 refills | Status: DC

## 2022-08-13 MED ORDER — CARVEDILOL 6.25 MG PO TABS: 9.7500 mg | ORAL_TABLET | Freq: Two times a day (BID) | ORAL | 3 refills | Status: DC

## 2022-08-13 NOTE — Patient Instructions (Signed)
Medication Instructions:   Your physician recommends that you continue on your current medications as directed. Please refer to the Current Medication list given to you today.  *If you need a refill on your cardiac medications before your next appointment, please call your pharmacy*   Lab Work:  TODAY--BMET AND CBC  If you have labs (blood work) drawn today and your tests are completely normal, you will receive your results only by: [place] (if you have MyChart) OR A paper copy in the mail If you have any lab test that is abnormal or we need to change your treatment, we will call you to review the results.    Follow-Up: At [facility], you and your health needs are our priority.  As part of our continuing mission to provide you with exceptional heart care, we have created designated Provider Care Teams.  These Care Teams include your primary Cardiologist (physician) and Advanced Practice Providers (APPs -  Physician Assistants and Nurse Practitioners) who all work together to provide you with the care you need, when you need it.  We recommend signing up for the patient portal called "MyChart".  Sign up information is provided on this After Visit Summary.  MyChart is used to connect with patients for Virtual Visits (Telemedicine).  Patients are able to view lab/test results, encounter notes, upcoming appointments, etc.  Non-urgent messages can be sent to your provider as well.   To learn more about what you can do with MyChart, go to [email or web address].    Your next appointment:   6 - 8 month(s)  Provider:   Freada Bergeron, MD

## 2022-08-14 LAB — BASIC METABOLIC PANEL
BUN/Creatinine Ratio: 12 (ref 10–24)
BUN: 17 mg/dL (ref 8–27)
CO2: 25 mmol/L (ref 20–29)
Calcium: 9.2 mg/dL (ref 8.6–10.2)
Chloride: 102 mmol/L (ref 96–106)
Creatinine, Ser: 1.4 mg/dL — ABNORMAL HIGH (ref 0.76–1.27)
Glucose: 80 mg/dL (ref 70–99)
Potassium: 5.1 mmol/L (ref 3.5–5.2)
Sodium: 139 mmol/L (ref 134–144)
eGFR: 57 mL/min/{1.73_m2} — ABNORMAL LOW (ref >59)

## 2022-08-14 LAB — CBC
Hematocrit: 42.7 % (ref 37.5–51.0)
Hemoglobin: 14.3 g/dL (ref 13.0–17.7)
MCH: 30.8 pg (ref 26.6–33.0)
MCHC: 33.5 g/dL (ref 31.5–35.7)
MCV: 92 fL (ref 79–97)
Platelets: 195 10*3/uL (ref 150–450)
RBC: 4.64 x10E6/uL (ref 4.14–5.80)
RDW: 12.8 % (ref 11.6–15.4)
WBC: 7.2 10*3/uL (ref 3.4–10.8)

## 2022-08-15 ENCOUNTER — Other Ambulatory Visit: Payer: Self-pay

## 2022-08-15 DIAGNOSIS — I279 Pulmonary heart disease, unspecified: Secondary | ICD-10-CM

## 2022-08-15 NOTE — Progress Notes (Unsigned)
I connected with  Lonnie Skinner on 08/16/2022 by a audio enabled telemedicine application and verified that I am speaking with the correct person using two identifiers.  Patient Location: Home  Provider Location: Home Office  I discussed the limitations of evaluation and management by telemedicine. The patient expressed understanding and agreed to proceed.   Subjective:   Lonnie Skinner is a 62 y.o. male who presents for Medicare Annual/Subsequent preventive examination.  Review of Systems    Per HPI unless specifically indicated below. Cardiac Risk Factors include: advanced age (>33men, >42 women);male gender,Hypertension, CAD, and Hyperlipidemia.          Objective:       08/13/2022    2:55 PM 05/04/2022   10:59 AM 02/12/2022    3:02 PM  Vitals with BMI  Height 5\' 8"  5\' 8"  5\' 8"   Weight 212 lbs 6 oz 212 lbs 13 oz 213 lbs  BMI 32.3 [id number] 0000000  Systolic [id number]  Diastolic 74 72 74  Pulse 61 81 66    There were no vitals filed for this visit. There is no height or weight on file to calculate BMI.     08/16/2022   10:26 AM 05/04/2022   11:45 AM 11/28/2021    9:38 AM 11/03/2021    2:02 PM 10/30/2021   10:50 AM 07/13/2021    3:41 PM 04/17/2021    3:48 PM  Advanced Directives  Does Patient Have a Medical Advance Directive? No No No No No No No  Would patient like information on creating a medical advance directive? No - Patient declined No - Patient declined Yes (MAU/Ambulatory/Procedural Areas - Information given) No - Patient declined No - Patient declined Yes (MAU/Ambulatory/Procedural Areas - Information given) No - Patient declined    Current Medications (verified) Outpatient Encounter Medications as of 08/16/2022  Medication Sig   ALPRAZolam (XANAX) 0.25 MG tablet TAKE 1 TABLET(0.25 MG) BY MOUTH TWICE DAILY AS NEEDED FOR ANXIETY   atorvastatin (LIPITOR) 80 MG tablet Take 1 tablet (80 mg total) by mouth every evening.   carvedilol (COREG) 6.25 MG tablet Take  1.5 tablets (9.375 mg total) by mouth 2 (two) times daily.   clopidogrel (PLAVIX) 75 MG tablet Take 1 tablet (75 mg total) by mouth daily.   furosemide (LASIX) 20 MG tablet Take 1 tablet (20 mg total) by mouth daily.   nitroGLYCERIN (NITROSTAT) 0.4 MG SL tablet Place 1 tablet (0.4 mg total) under the tongue every 5 (five) minutes as needed for chest pain (up to 3 doses).   spironolactone (ALDACTONE) 25 MG tablet Take 0.5 tablets (12.5 mg total) by mouth daily.   traMADol (ULTRAM) 50 MG tablet Take 1 tablet (50 mg total) by mouth every 4 (four) hours as needed for moderate pain.   aspirin 81 MG EC tablet Take 1 tablet (81 mg total) by mouth daily. (Patient not taking: Reported on 08/16/2022)   benzonatate (TESSALON) 100 MG capsule Take 1 capsule (100 mg total) by mouth 2 (two) times daily as needed for cough. (Patient not taking: Reported on 08/16/2022)   No facility-administered encounter medications on file as of 08/16/2022.    Allergies (verified) Penicillins   History: Past Medical History:  Diagnosis Date   CAD (coronary artery disease)    a. 05/2013: NSTEMI s/p DES to mRCA (culprit vessel), residual moderate LAD disease (the patient fails medical therapy and this was found to be significant, this would be amenable to PCI).   CHF (congestive  heart failure) (HCC)    CKD (chronic kidney disease), stage II    DVT (deep venous thrombosis) ([place]) 1981   Hypercholesteremia    Hyperglycemia    Hypertension    Past Surgical History:  Procedure Laterality Date   CORONARY ARTERY BYPASS GRAFT N/A 08/09/2020   Procedure: CORONARY ARTERY BYPASS GRAFTING (CABG) X  FOUR   USING  LEFT INTERNAL MAMMARY ARTERY HARVEST AND RIGHT AND LEFT ENDOSCOPIC SAPHENOUS VEIN HARVEST;  Surgeon: [name], MD;  Location: [facility];  Service: Open Heart Surgery;  Laterality: N/A;   CORONARY STENT PLACEMENT  05/2013   mRCA   CORONARY/GRAFT ACUTE MI REVASCULARIZATION  2015   INTRAVASCULAR PRESSURE WIRE/FFR STUDY  N/A 08/02/2020   Procedure: INTRAVASCULAR PRESSURE WIRE/FFR STUDY;  Surgeon: [name], [name], MD;  Location: [facility];  Service: Cardiovascular;  Laterality: N/A;   KNEE SURGERY Left 2009   LEFT HEART CATH AND CORONARY ANGIOGRAPHY N/A 08/02/2020   Procedure: LEFT HEART CATH AND CORONARY ANGIOGRAPHY;  Surgeon: [name], [name], MD;  Location: [facility];  Service: Cardiovascular;  Laterality: N/A;   LEFT HEART CATHETERIZATION WITH CORONARY ANGIOGRAM N/A 06/02/2013   Procedure: LEFT HEART CATHETERIZATION WITH CORONARY ANGIOGRAM;  Surgeon: [name], MD;  Location: [facility] C[facility];  Service: Cardiovascular;  Laterality: N/A;   PERCUTANEOUS CORONARY STENT INTERVENTION (PCI-S)  06/02/2013   Procedure: PERCUTANEOUS CORONARY STENT INTERVENTION (PCI-S);  Surgeon: [name], MD;  Location: [facility] CATH [facility];  Service: Cardiovascular;;   TEE WITHOUT CARDIOVERSION N/A 08/09/2020   Procedure: TRANSESOPHAGEAL ECHOCARDIOGRAM (TEE);  Surgeon: [name], MD;  Location: [facility];  Service: Open Heart Surgery;  Laterality: N/A;   Family History  Problem Relation Age of Onset   Diabetes Mother    Heart failure Father    Heart attack Father    Bone cancer Father    Heart disease Brother        cabg   Heart attack Brother    CAD Other        Multiple family members with CABG   Colon cancer Neg Hx    Pancreatic cancer Neg Hx    Liver disease Neg Hx    Esophageal cancer Neg Hx    Social History   Socioeconomic History   Marital status: Widowed    Spouse name: Not on file   Number of children: 2   Years of education: 67   Highest education level: 11th grade  Occupational History    Comment: Disabled  Tobacco Use   Smoking status: Former    Types: Cigarettes    Quit date: 2007    Years since quitting: 17.2    Passive exposure: Past   Smokeless tobacco: Never  Vaping Use   Vaping Use: Never used  Substance and Sexual Activity   Alcohol use: No   Drug use: No    Sexual activity: Not Currently    Birth control/protection: None  Other Topics Concern   Not on file  Social History Narrative   Patient lives alone in. Patients wife died ~1 year ago.    Patient has support from his son and some friends.    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    Patient recently graduated cardiac rehab and has been going to [employer] 3x per week for ~ 2 hours.    Social Determinants of Health   Financial Resource Strain: Low Risk  (08/16/2022)   Overall Financial Resource  Strain (CARDIA)    Difficulty of Paying Living Expenses: Not hard at all  Food Insecurity: No Food Insecurity (08/16/2022)   Hunger Vital Sign    Worried About Running Out of Food in the Last Year: Never true    Ran Out of Food in the Last Year: Never true  Transportation Needs: No Transportation Needs (08/16/2022)   PRAPARE - Hydrologist (Medical): No    Lack of Transportation (Non-Medical): No  Physical Activity: Inactive (08/16/2022)   Exercise Vital Sign    Days of Exercise per Week: 0 days    Minutes of Exercise per Session: 0 min  Stress: Stress Concern Present (08/16/2022)   [place]    Feeling of Stress : To some extent  Social Connections: Socially Isolated (07/13/2021)   Social Connection and Isolation Panel [NHANES]    Frequency of Communication with Friends and Family: More than three times a week    Frequency of Social Gatherings with Friends and Family: More than three times a week    Attends Religious Services: Never    Marine scientist or Organizations: No    Attends Archivist Meetings: Never    Marital Status: Widowed    Tobacco Counseling Counseling given: No   Clinical Intake:  Pre-visit preparation completed: No  Pain : No/denies pain     Nutritional Status: BMI > 30  Obese Nutritional Risks: Unintentional weight gain Diabetes:  No  How often do you need to have someone help you when you read instructions, pamphlets, or other written materials from your doctor or pharmacy?: 3 - Sometimes  Diabetic?No  Interpreter Needed?: No  Information entered by :: Donnie Mesa, CMA   Activities of Daily Living    08/16/2022   10:15 AM  In your present state of health, do you have any difficulty performing the following activities:  Hearing? 0  Vision? [place]  Difficulty concentrating or making decisions? 0  Walking or climbing stairs? 1  Dressing or bathing? 0  Doing errands, shopping? 0    Patient Care Team: Alen Bleacher, MD as PCP - General (Family Medicine) Freada Bergeron, MD as PCP - Cardiology (Cardiology)  Indicate any recent Medical Services you may have received from other than [facility] providers in the past year (date may be approximate).     Assessment:   This is a routine wellness examination for Nicki.  Hearing/Vision screen Denies any hearing issues. Denies any vision changes. [facility]   Dietary issues and exercise activities discussed:     Goals Addressed   None    Depression Screen    05/04/2022   11:43 AM 12/04/2021    1:34 PM 11/03/2021    2:01 PM 10/30/2021   10:50 AM 07/13/2021    3:33 PM 12/01/2020    3:48 PM 09/29/2020    3:48 PM  PHQ 2/9 Scores  PHQ - 2 Score 1 0 1 0 1 1 2   PHQ- 9 Score 2  4 0  1 5    Fall Risk    08/16/2022   10:14 AM 11/03/2021    2:01 PM 07/13/2021    3:41 PM 04/17/2021    3:48 PM 09/28/2020    3:30 PM  [place] in the past year? 0 0 1 0 0  Number falls in past yr: 0 0 0 0  Injury with Fall? 0 0 0 0   Risk for fall due to : No Fall Risks  Impaired balance/gait  Medication side effect  Follow up Falls evaluation completed  Falls prevention discussed  Falls prevention discussed;Education provided    FALL RISK PREVENTION PERTAINING TO THE HOME:  Any stairs in or around the home? No  If  so, are there any without handrails? No  Home free of loose throw rugs in walkways, pet beds, electrical cords, etc? Yes  Adequate lighting in your home to reduce risk of falls? Yes   ASSISTIVE DEVICES UTILIZED TO PREVENT FALLS:  Life alert? No  Use of a cane, walker or w/c? No  Grab bars in the bathroom? No  Shower chair or bench in shower? Yes  Elevated toilet seat or a handicapped toilet? No   TIMED UP AND GO:  Was the test performed? Unable to perform, virtual appointment   Cognitive Function:        08/16/2022   10:22 AM 07/13/2021    3:44 PM  6CIT Screen  What Year? 0 points 0 points  What month? 0 points 0 points  What time? 0 points 0 points  Count back from 20 0 points 0 points  Months in reverse 0 points 0 points  Repeat phrase 0 points 0 points  Total Score 0 points 0 points    Immunizations Immunization History  Administered Date(s) Administered   Influenza Whole 03/22/2009   Influenza,inj,Quad PF,6+ Mos 06/03/2013, 04/22/2019, 03/09/2020, 03/22/2021   Influenza-Unspecified 02/18/2017, 03/21/2018   PNEUMOCOCCAL CONJUGATE-20 04/05/2021   Pneumococcal Polysaccharide-23 09/28/2014   Tdap 11/19/2018    TDAP status: Up to date  Flu Vaccine status: Up to date  Pneumococcal vaccine status: Up to date  Covid-19 vaccine status: Information provided on how to obtain vaccines.   Qualifies for Shingles Vaccine? Yes   Zostavax completed Yes   Shingrix Completed?: Yes  Screening Tests Health Maintenance  Topic Date Due   COVID-19 Vaccine (1) Never done   Medicare Annual Wellness (AWV)  08/16/2023   COLONOSCOPY (Pts 45-11yrs Insurance coverage will need to be confirmed)  09/26/2024   DTaP/Tdap/Td (2 - Td or Tdap) 11/18/2028   INFLUENZA VACCINE  Completed   Hepatitis C Screening  Completed   HIV Screening  Completed   Zoster Vaccines- Shingrix  Completed   HPV VACCINES  Aged Out    Health Maintenance  Health Maintenance Due  Topic Date Due   COVID-19  Vaccine (1) Never done    Colorectal cancer screening: Type of screening: Colonoscopy. Completed 09/26/2021. Repeat every 3 years  Lung Cancer Screening: (Low Dose CT Chest recommended if Age 57-80 years, 30 pack-year currently smoking OR have quit w/in 15years.) does not qualify.   Lung Cancer Screening Referral: not applicable   Additional Screening:  Hepatitis C Screening: does qualify; Completed 06/02/2018  Vision Screening: Recommended annual ophthalmology exams for early detection of glaucoma and other disorders of the eye. Is the patient up to date with their annual eye exam?  Yes  Who is the provider or what is the name of the office in which the patient attends annual [facility] exams? [facility] If pt is not established with a provider, would they like to be referred to a provider to establish care? No .   Dental Screening: Recommended annual dental exams for proper oral hygiene  Community Resource Referral / Chronic Care Management: CRR required this visit?  No   CCM required this visit?  No  Plan:     I have personally reviewed and noted the following in the patient's chart:   Medical and social history Use of alcohol, tobacco or illicit drugs  Current medications and supplements including opioid prescriptions. Patient is not currently taking opioid prescriptions. Functional ability and status Nutritional status Physical activity Advanced directives List of other physicians Hospitalizations, surgeries, and ER visits in previous 12 months Vitals Screenings to include cognitive, depression, and falls Referrals and appointments  In addition, I have reviewed and discussed with patient certain preventive protocols, quality metrics, and best practice recommendations. A written personalized care plan for preventive services as well as general preventive health recommendations were provided to patient.     Mr. Filipiak , Thank you for taking time to come for your Medicare  Wellness Visit. I appreciate your ongoing commitment to your health goals. Please review the following plan we discussed and let me know if I can assist you in the future.   These are the goals we discussed:  Goals      Patient Stated     Get colonoscopy this year.         This is a list of the screening recommended for you and due dates:  Health Maintenance  Topic Date Due   COVID-19 Vaccine (1) Never done   Medicare Annual Wellness Visit  08/16/2023   Colon Cancer Screening  09/26/2024   DTaP/Tdap/Td vaccine (2 - Td or Tdap) 11/18/2028   Flu Shot  Completed   Hepatitis C Screening: USPSTF Recommendation to screen - Ages 18-79 yo.  Completed   HIV Screening  Completed   Zoster (Shingles) Vaccine  Completed   HPV Vaccine  Aged [address] [place]   08/16/2022  Nurse Notes: Approximately 30 minute Non-Face -To-Face Medicare Wellness Visit

## 2022-08-15 NOTE — Patient Instructions (Signed)
Health Maintenance, Male Adopting a healthy lifestyle and getting preventive care are important in promoting health and wellness. Ask your health care provider about: The right schedule for you to have regular tests and exams. Things you can do on your own to prevent diseases and keep yourself healthy. What should I know about diet, weight, and exercise? Eat a healthy diet  Eat a diet that includes plenty of vegetables, fruits, low-fat dairy products, and lean protein. Do not eat a lot of foods that are high in solid fats, added sugars, or sodium. Maintain a healthy weight Body mass index (BMI) is a measurement that can be used to identify possible weight problems. It estimates body fat based on height and weight. Your health care provider can help determine your BMI and help you achieve or maintain a healthy weight. Get regular exercise Get regular exercise. This is one of the most important things you can do for your health. Most adults should: Exercise for at least 150 minutes each week. The exercise should increase your heart rate and make you sweat (moderate-intensity exercise). Do strengthening exercises at least twice a week. This is in addition to the moderate-intensity exercise. Spend less time sitting. Even light physical activity can be beneficial. Watch cholesterol and blood lipids Have your blood tested for lipids and cholesterol at 63 years of age, then have this test every 5 years. You may need to have your cholesterol levels checked more often if: Your lipid or cholesterol levels are high. You are older than 63 years of age. You are at high risk for heart disease. What should I know about cancer screening? Many types of cancers can be detected early and may often be prevented. Depending on your health history and family history, you may need to have cancer screening at various ages. This may include screening for: Colorectal cancer. Prostate cancer. Skin cancer. Lung  cancer. What should I know about heart disease, diabetes, and high blood pressure? Blood pressure and heart disease High blood pressure causes heart disease and increases the risk of stroke. This is more likely to develop in people who have high blood pressure readings or are overweight. Talk with your health care provider about your target blood pressure readings. Have your blood pressure checked: Every 3-5 years if you are 18-39 years of age. Every year if you are 40 years old or older. If you are between the ages of 65 and 75 and are a current or former smoker, ask your health care provider if you should have a one-time screening for abdominal aortic aneurysm (AAA). Diabetes Have regular diabetes screenings. This checks your fasting blood sugar level. Have the screening done: Once every three years after age 45 if you are at a normal weight and have a low risk for diabetes. More often and at a younger age if you are overweight or have a high risk for diabetes. What should I know about preventing infection? Hepatitis B If you have a higher risk for hepatitis B, you should be screened for this virus. Talk with your health care provider to find out if you are at risk for hepatitis B infection. Hepatitis C Blood testing is recommended for: Everyone born from 1945 through 1965. Anyone with known risk factors for hepatitis C. Sexually transmitted infections (STIs) You should be screened each year for STIs, including gonorrhea and chlamydia, if: You are sexually active and are younger than 63 years of age. You are older than 63 years of age and your   health care provider tells you that you are at risk for this type of infection. Your sexual activity has changed since you were last screened, and you are at increased risk for chlamydia or gonorrhea. Ask your health care provider if you are at risk. Ask your health care provider about whether you are at high risk for HIV. Your health care provider  may recommend a prescription medicine to help prevent HIV infection. If you choose to take medicine to prevent HIV, you should first get tested for HIV. You should then be tested every 3 months for as long as you are taking the medicine. Follow these instructions at home: Alcohol use Do not drink alcohol if your health care provider tells you not to drink. If you drink alcohol: Limit how much you have to 0-2 drinks a day. Know how much alcohol is in your drink. In the [place] one drink equals one 12 oz bottle of beer (355 mL), one 5 oz glass of wine (148 mL), or one 1 oz glass of hard liquor (44 mL). Lifestyle Do not use any products that contain nicotine or tobacco. These products include cigarettes, chewing tobacco, and vaping devices, such as e-cigarettes. If you need help quitting, ask your health care provider. Do not use street drugs. Do not share needles. Ask your health care provider for help if you need support or information about quitting drugs. General instructions Schedule regular health, dental, and eye exams. Stay current with your vaccines. Tell your health care provider if: You often feel depressed. You have ever been abused or do not feel safe at home. Summary Adopting a healthy lifestyle and getting preventive care are important in promoting health and wellness. Follow your health care provider's instructions about healthy diet, exercising, and getting tested or screened for diseases. Follow your health care provider's instructions on monitoring your cholesterol and blood pressure. This information is not intended to replace advice given to you by your health care provider. Make sure you discuss any questions you have with your health care provider. Document Revised: 09/26/2020 Document Reviewed: 09/26/2020 Elsevier Patient Education  2023 Elsevier Inc.  

## 2022-08-16 ENCOUNTER — Ambulatory Visit (INDEPENDENT_AMBULATORY_CARE_PROVIDER_SITE_OTHER): Payer: No Typology Code available for payment source

## 2022-08-16 DIAGNOSIS — Z0000 Encounter for general adult medical examination without abnormal findings: Secondary | ICD-10-CM | POA: Diagnosis not present

## 2022-08-16 NOTE — Addendum Note (Signed)
Addended by: Ronie Spies on: 08/16/2022 09:13 AM   Modules accepted: Orders

## 2022-08-22 ENCOUNTER — Encounter: Payer: Self-pay | Admitting: *Deleted

## 2022-08-22 ENCOUNTER — Telehealth: Payer: Self-pay | Admitting: Cardiology

## 2022-08-22 NOTE — Telephone Encounter (Signed)
Pt is calling to seek assistance with what OTC medications he can safely take from a cardiac perspective, for allergies.   Pt states he's been sneezing, coughing, having runny nose and watery eyes since the pollen count has risen.  He is asking what he can safely take for this that will be safe with his heart and heart medications.   Advised the pt that he can safely take claritin as directed for allergies, coricidin as directed for cough, and mucinex WITHOUT D and as directed.  Advised him he can use saline nasal spray for stuffy nose as needed.   Pt requested that I send these medication names to his mychart so that he can have to refer to, when going to the pharmacy to look for these OTC medications.  Will send these to his mychart as requested.   Pt verbalized understanding and agrees with this plan.

## 2022-08-22 NOTE — Telephone Encounter (Signed)
Pt would like a callback regarding letting him know what over the counter allergy medications he can take since he has heart conditions. He's very concerned about what he can and can't take to help him feel better. Please advise.

## 2022-12-08 ENCOUNTER — Other Ambulatory Visit: Payer: Self-pay | Admitting: Physician Assistant

## 2022-12-08 DIAGNOSIS — I5032 Chronic diastolic (congestive) heart failure: Secondary | ICD-10-CM

## 2022-12-08 DIAGNOSIS — E782 Mixed hyperlipidemia: Secondary | ICD-10-CM

## 2022-12-08 DIAGNOSIS — I2510 Atherosclerotic heart disease of native coronary artery without angina pectoris: Secondary | ICD-10-CM

## 2022-12-08 DIAGNOSIS — E785 Hyperlipidemia, unspecified: Secondary | ICD-10-CM

## 2022-12-08 DIAGNOSIS — R55 Syncope and collapse: Secondary | ICD-10-CM

## 2022-12-08 DIAGNOSIS — I10 Essential (primary) hypertension: Secondary | ICD-10-CM

## 2022-12-08 DIAGNOSIS — R42 Dizziness and giddiness: Secondary | ICD-10-CM

## 2022-12-26 ENCOUNTER — Other Ambulatory Visit (HOSPITAL_BASED_OUTPATIENT_CLINIC_OR_DEPARTMENT_OTHER): Payer: Self-pay | Admitting: *Deleted

## 2022-12-26 DIAGNOSIS — R55 Syncope and collapse: Secondary | ICD-10-CM

## 2022-12-26 DIAGNOSIS — E782 Mixed hyperlipidemia: Secondary | ICD-10-CM

## 2022-12-26 DIAGNOSIS — R42 Dizziness and giddiness: Secondary | ICD-10-CM

## 2022-12-26 DIAGNOSIS — I2510 Atherosclerotic heart disease of native coronary artery without angina pectoris: Secondary | ICD-10-CM

## 2022-12-26 DIAGNOSIS — E785 Hyperlipidemia, unspecified: Secondary | ICD-10-CM

## 2022-12-26 DIAGNOSIS — I10 Essential (primary) hypertension: Secondary | ICD-10-CM

## 2022-12-26 DIAGNOSIS — I5032 Chronic diastolic (congestive) heart failure: Secondary | ICD-10-CM

## 2022-12-26 MED ORDER — FUROSEMIDE 20 MG PO TABS: 20.0000 mg | ORAL_TABLET | Freq: Every day | ORAL | 2 refills | Status: DC

## 2023-03-08 NOTE — Progress Notes (Deleted)
Cardiology Office Note:    Date:  03/08/2023   ID:  Lonnie Skinner, DOB 02-23-1961, MRN [id number]  PCP:  Lonnie Simon, MD   [facility]  Cardiologist:  Lonnie Sprague, MD (Inactive)  Advanced Practice Provider:  No care team member to display Electrophysiologist:  None    Referring MD: Lonnie Simon, MD    History of Present Illness:    Lonnie Skinner is a 62 y.o. male with a hx of CAD/NSTEMI s/p DES to [facility] with residual moderate LAD disease in 2015, chronic diastolic heart failure, HTN, HLD, CKD who was previously followed seen by Dr. Delton Skinner who now returns to clinic for follow-up.  Admitted back in early 2015 with CAD/NSTEMI - s/p DES to the mRCA, residual moderate LAD disease (if fails on medical therapy would proceed with PCI). He has typically not followed CV risk factor modification.   Was seen by me in clinic on 07/27/20 where he was having chest pain on exertion. Cath 08/02/20 showed critical 95% prox LAD, 70% OM1, 60% OM2, 70% prox RCA with abnormal FFR. He subsequently underwent CABG 07/2020 with LIMA-LAD, S-PDA, S-OM1, S-OM2 with Dr. Cliffton Skinner. TTE 08/12/20 with LVEF 60-65%, no WMA.   Was seen on 10/2021 by Lonnie Skinner for dizziness. Recommended for zio monitor which is pending. Carotid ultrasound without significant disease. MRI brain without significant findings.  Was last seen in 01/2022 where he was doing well from a CV standpoint.  Today, the patient feels well. No chest pain, SOB, orthopnea, LE edema, or palpitations. Dizziness has significantly improved with decreasing lasix and increasing fluid intake.    THe pt was previously followed by Lonnie Skinner, last seen in March 2024   Past Medical History:  Diagnosis Date   CAD (coronary artery disease)    a. 05/2013: NSTEMI s/p DES to mRCA (culprit vessel), residual moderate LAD disease (the patient fails medical therapy and this was found to be significant, this would be amenable to PCI).    CHF (congestive heart failure) (HCC)    CKD (chronic kidney disease), stage II    DVT (deep venous thrombosis) (HCC) 1981   Hypercholesteremia    Hyperglycemia    Hypertension     Past Surgical History:  Procedure Laterality Date   CORONARY ARTERY BYPASS GRAFT N/A 08/09/2020   Procedure: CORONARY ARTERY BYPASS GRAFTING (CABG) X  FOUR   USING  LEFT INTERNAL MAMMARY ARTERY HARVEST AND RIGHT AND LEFT ENDOSCOPIC SAPHENOUS VEIN HARVEST;  Surgeon: Lonnie Skains, MD;  Location: [facility] OR;  Service: Open Heart Surgery;  Laterality: N/A;   CORONARY PRESSURE/FFR STUDY N/A 08/02/2020   Procedure: INTRAVASCULAR PRESSURE WIRE/FFR STUDY;  Surgeon: Swaziland, [name], MD;  Location: [facility] INVASIVE CV LAB;  Service: Cardiovascular;  Laterality: N/A;   CORONARY STENT PLACEMENT  05/2013   mRCA   CORONARY/GRAFT ACUTE MI REVASCULARIZATION  2015   KNEE SURGERY Left 2009   LEFT HEART CATH AND CORONARY ANGIOGRAPHY N/A 08/02/2020   Procedure: LEFT HEART CATH AND CORONARY ANGIOGRAPHY;  Surgeon: Swaziland, [name], MD;  Location: [facility] INVASIVE CV LAB;  Service: Cardiovascular;  Laterality: N/A;   LEFT HEART CATHETERIZATION WITH CORONARY ANGIOGRAM N/A 06/02/2013   Procedure: LEFT HEART CATHETERIZATION WITH CORONARY ANGIOGRAM;  Surgeon: Corky Crafts, MD;  Location: [facility] CATH LAB;  Service: Cardiovascular;  Laterality: N/A;   PERCUTANEOUS CORONARY STENT INTERVENTION (PCI-S)  06/02/2013   Procedure: PERCUTANEOUS CORONARY STENT INTERVENTION (PCI-S);  Surgeon: Corky Crafts, MD;  Location: [facility]  CATH LAB;  Service: Cardiovascular;;   TEE WITHOUT CARDIOVERSION N/A 08/09/2020   Procedure: TRANSESOPHAGEAL ECHOCARDIOGRAM (TEE);  Surgeon: Lonnie Skains, MD;  Location: [facility] OR;  Service: Open Heart Surgery;  Laterality: N/A;    Current Medications: No outpatient medications have been marked as taking for the 03/11/23 encounter (Appointment) with Pricilla Riffle, MD.     Allergies:   Penicillins   Social History    Socioeconomic History   Marital status: Widowed    Spouse name: Not on file   Number of children: 2   Years of education: 19   Highest education level: 11th grade  Occupational History    Comment: Disabled  Tobacco Use   Smoking status: Former    Current packs/day: 0.00    Types: Cigarettes    Quit date: 2007    Years since quitting: 17.8    Passive exposure: Past   Smokeless tobacco: Never  Vaping Use   Vaping status: Never Used  Substance and Sexual Activity   Alcohol use: No   Drug use: No   Sexual activity: Not Currently    Birth control/protection: None  Other Topics Concern   Not on file  Social History Narrative   Patient lives alone in. Patients wife died ~1 year ago.    Patient has support from his son and some friends.    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    Patient recently graduated cardiac rehab and has been going to [employer] 3x per week for ~ 2 hours.    Social Determinants of Health   Financial Resource Strain: Low Risk  (08/16/2022)   Overall Financial Resource Strain (CARDIA)    Difficulty of Paying Living Expenses: Not hard at all  Food Insecurity: No Food Insecurity (08/16/2022)   Hunger Vital Sign    Worried About Running Out of Food in the Last Year: Never true    Ran Out of Food in the Last Year: Never true  Transportation Needs: No Transportation Needs (08/16/2022)   PRAPARE - Administrator, Civil Service (Medical): No    Lack of Transportation (Non-Medical): No  Physical Activity: Inactive (08/16/2022)   Exercise Vital Sign    Days of Exercise per Week: 0 days    Minutes of Exercise per Session: 0 min  Stress: Stress Concern Present (08/16/2022)   [employer] of Occupational Health - Occupational Stress Questionnaire    Feeling of Stress : To some extent  Social Connections: Socially Isolated (07/13/2021)   Social Connection and Isolation Panel [NHANES]    Frequency of Communication with Friends and  Family: More than three times a week    Frequency of Social Gatherings with Friends and Family: More than three times a week    Attends Religious Services: Never    Database administrator or Organizations: No    Attends Banker Meetings: Never    Marital Status: Widowed     Family History: The patient's family history includes Bone cancer in his father; CAD in an other family member; Diabetes in his mother; Heart attack in his brother and father; Heart disease in his brother; Heart failure in his father. There is no history of Colon cancer, Pancreatic cancer, Liver disease, or Esophageal cancer.  ROS:   Please Skinner the history of present illness.    Review of Systems  Constitutional:  Negative for chills and fever.  HENT:  Negative for hearing loss.   Eyes:  Negative for blurred vision and redness.  Respiratory:  Negative for shortness of breath.   Cardiovascular:  Negative for chest pain, palpitations, orthopnea, claudication, leg swelling and PND.  Gastrointestinal:  Negative for melena, nausea and vomiting.  Genitourinary:  Negative for dysuria and flank pain.  Musculoskeletal:  Negative for myalgias.  Skin:  Negative for rash.  Neurological:  Negative for dizziness and loss of consciousness.  Endo/Heme/Allergies:  Negative for polydipsia.  Psychiatric/Behavioral:  Negative for depression. The patient is not nervous/anxious.     EKGs/Labs/Other Studies Reviewed:    The following studies were reviewed today: Monitor 11/2021:    Predominant rhythm was 6 days and 12 hours   Predominant rhythm was NSR with average HR 72bpm   There were 11 runs of SVT with longest lasting 9 beats   Frequent VE (6.4%), rare SVE (<1%)   Patient triggered event correlates with VE   No sustained arrhythmias or significant pauses     Patch Wear Time:  6 days and 12 hours (2023-07-07T10:42:58-0400 to 2023-07-13T22:48:15-0400)   Patient had a min HR of 51 bpm, max HR of 129 bpm, and avg HR  of 72 bpm. Predominant underlying rhythm was Sinus Rhythm. 11 Supraventricular Tachycardia runs occurred, the run with the fastest interval lasting 9 beats with a max rate of 129 bpm (avg 116  bpm); the run with the fastest interval was also the longest. Isolated SVEs were rare (<1.0%), SVE Couplets were rare (<1.0%), and SVE Triplets were rare (<1.0%). Isolated VEs were frequent (6.4%, [id number]), VE Couplets were rare (<1.0%, 2040), and VE  Triplets were rare (<1.0%, 2). Ventricular Bigeminy and Trigeminy were present.  Myocardial Perfusion Imaging 11/13/2021   The study is normal. The study is low risk.   No ST deviation was noted.   LV perfusion is normal. There is no evidence of ischemia. There is no evidence of infarction.   Left ventricular function is normal. Nuclear stress EF: 56 %. The left ventricular ejection fraction is normal (55-65%). End diastolic cavity size is normal. End systolic cavity size is normal.   Prior study not available for comparison.   Normal stress nuclear study with no ischemia or infarction.  Gated ejection fraction 56% with normal wall motion.  Carotid Ultrasound 10/2021: Summary:  Right Carotid: The extracranial vessels were near-normal with only minimal  wall                 thickening or plaque.   Left Carotid: The extracranial vessels were near-normal with only minimal  wall                thickening or plaque.   Vertebrals:  Bilateral vertebral arteries demonstrate antegrade flow.  Subclavians: Normal flow hemodynamics were seen in bilateral subclavian               arteries.   Lower Extremity Venous Reflux 09/20/20 Summary:  Right:  - No evidence of acute deep vein thrombosis seen in the right lower extremity, from the common femoral through the popliteal veins.  - Venous reflux is noted in the right common femoral vein.  - Venous reflux is noted in the right sapheno-femoral junction.  - Venous reflux is noted in the right greater saphenous vein in  the proximal thigh.  - Venous reflux is noted in the right short saphenous vein, fossa area.     Left:  - No evidence of acute deep vein thrombosis seen in the left lower extremity, from the common femoral through  the popliteal veins.  - No evidence of superficial venous reflux seen in the left greater saphenous vein.  - No evidence of superficial venous reflux seen in the left short saphenous vein.  - Venous reflux is noted in the left common femoral vein.  - Venous reflux is noted in the left sapheno-femoral junction.   Echo TEE 08/09/20 PRE-OP FINDINGS   Left Ventricle: The left ventricle has normal systolic function, with an ejection fraction of 55-60%. The cavity size was normal. There is no increase in left ventricular wall thickness.   Right Ventricle: The right ventricle has normal systolic function. The cavity was normal. There is no increase in right ventricular wall thickness.   Left Atrium: Left atrial size was not assessed. No left atrial/left atrial appendage thrombus was detected.   Right Atrium: Right atrial size was not assessed.   Interatrial Septum: The interatrial septum was not assessed.   Pericardium: A small pericardial effusion is present. The pericardial effusion is localized near the right ventricle.   Mitral Valve: The mitral valve is normal in structure. Mitral valve regurgitation is trivial by color flow Doppler.   Tricuspid Valve: The tricuspid valve was normal in structure. Tricuspid valve regurgitation is trivial by color flow Doppler.   Aortic Valve: The aortic valve is bicuspid Aortic valve regurgitation was not visualized by color flow Doppler. There is no stenosis of the aortic valve.   Pulmonic Valve: The pulmonic valve was normal in structure. Pulmonic valve regurgitation is trivial by color flow Doppler.    Cath 08/02/20: Prox LAD lesion is 95% stenosed. Prox LAD to Mid LAD lesion is 80% stenosed. 1st Mrg lesion is 75% stenosed. 2nd Mrg lesion  is 60% stenosed. Prox RCA to Mid RCA lesion is 70% stenosed. Previously placed Mid RCA-1 stent (unknown type) is widely patent. Mid RCA-2 lesion is 50% stenosed. The left ventricular systolic function is normal. LV end diastolic pressure is mildly elevated. The left ventricular ejection fraction is 55-65% by visual estimate.   1. 3 vessel obstructive CAD    - critical 95% proximal LAD    - 70% OM1    - 60% OM2    - 70% mid RCA proximal to prior stent with abnormal RFR 2. Normal LV function 3. Mildly elevated LVEDP   Plan: recommend CABG. Will hold Plavix. Admit to telemetry and treat with IV heparin. Check Echo.   TTE 08/02/20: IMPRESSIONS   1. Left ventricular ejection fraction, by estimation, is 60 to 65%. The left ventricle has normal function. The left ventricle has no regional wall motion abnormalities. Left ventricular diastolic parameters were normal.   2. Right ventricular systolic function is normal. The right ventricular size is normal. Tricuspid regurgitation signal is inadequate for assessing PA pressure.   3. The mitral valve is normal in structure. No evidence of mitral valve regurgitation. No evidence of mitral stenosis.   4. The aortic valve is normal in structure. Aortic valve regurgitation is not visualized. No aortic stenosis is present.   5. The inferior vena cava is normal in size with greater than 50% respiratory variability, suggesting right atrial pressure of 3 mmHg.   06/18/13  ECHO Study Conclusions  Left ventricle: The cavity size was normal. There was mild focal basal hypertrophy of the septum. Systolic function was normal. The estimated ejection fraction was in the range of 55% to 60%. Wall motion was normal; there were no regional wall motion abnormalities. Features are consistent with a pseudonormal left ventricular filling pattern,  with concomitant abnormal relaxation and increased filling pressure (grade 2 diastolic dysfunction).  Impressions:  -  No prior study for comparison.  ANGIOGRAPHIC DATA: The left main coronary artery is absent. There appear to be separate ostia of the LAD and circumflex.  The left anterior descending artery is a large vessel which reaches the apex. In the mid vessel, there is a 50-70% lesion in the mid LAD. There is mild to moderate diffuse disease. There 2 large diagonals which are widely patent. The apical LAD is small but patent.  The left circumflex artery is a medium size vessel. There appears to be some vasospasm proximally. There is a large first marginal which is patent. The second marginal is a larger vessel which branches across the lateral wall. This appears widely patent.  The right coronary artery is a large dominant vessel. In the mid vessel, there is a focal 99% stenosis with visible thrombus. The posterior lateral artery is very large and widely patent. The posterior descending artery is widely patent.  LEFT VENTRICULOGRAM: Left ventricular angiogram was not done. LVEDP was 13 mmHg.  PCI NARRATIVE: A JR 4 guiding catheters u used to engage the RCA. Angiomax was used for anticoagulation. Initially, a pro-water wire was advanced but would not cross the lesion. A Fielder XT density cross the lesion in the mid right coronary artery. A 2.5 x 12 balloon was used to predilate the lesion. A 2.75 x 16 promise drug-eluting stent was then deployed. The stent was post dilated with a 3.25 x 12 noncompliant balloon. There is an excellent angiographic result. There is no residual stenosis. There were several areas of vasospasm during intervention noted in the distal right and posterior lateral artery. Both resolved with intracoronary nitroglycerin.  EKG:  ECG shows NSR, septal infarct, HR 62  Recent Labs: 08/13/2022: BUN 17; Creatinine, Ser 1.40; Hemoglobin 14.3; Platelets 195; Potassium 5.1; Sodium 139   Recent Lipid Panel    Component Value Date/Time   CHOL 93 (L) 02/20/2022 0938   TRIG 91 02/20/2022 0938   HDL  39 (L) 02/20/2022 0938   CHOLHDL 2.4 02/20/2022 0938   CHOLHDL 3.1 02/13/2016 1540   VLDL 21 02/13/2016 1540   LDLCALC 36 02/20/2022 0938     Risk Assessment/Calculations:       Physical Exam:    VS:  There were no vitals taken for this visit.    Wt Readings from Last 3 Encounters:  08/13/22 212 lb 6.4 oz (96.3 kg)  05/04/22 212 lb 12.8 oz (96.5 kg)  02/12/22 213 lb (96.6 kg)     GEN:  Well nourished, well developed in no acute distress HEENT: Normal NECK: No JVD; No carotid bruits CARDIAC: Irregular, no murmurs RESPIRATORY:  Clear to auscultation without rales, wheezing or rhonchi  ABDOMEN: Soft, non-tender, non-distended MUSCULOSKELETAL: Chronic venous stasis changes SKIN: Warm and dry NEUROLOGIC:  Alert and oriented x 3 PSYCHIATRIC:  Normal affect   ASSESSMENT:    No diagnosis found.    PLAN:    In order of problems listed above:  #Severe multivessel CAD s/p CABG Patient with history of NSTEMI in in 2015 s/p DES to mid-RCA with known residual moderate LAD disease. Presented to clinic with exertional chest pain in 07/2020 found to have multivessel CAD s/p 4v CABG on 07/2020 with L-LAD, S-PDA, S-OM1, S-OM2. Currently, doing well without anginal symptoms. -Continue plavix 75mg  daily -Continue aspirin 81mg  -Continue carvedilol 9.375mg  BID -Continue atorvastatin 80mg  daily  #Chronic diastolic CHF: Stable and euvolemic on examination  with NYHA class I symptoms -Continue lasix 20mg  daily -Continue carvedilol 9.375mg  BID -Continue spiro 12.5mg  daily -Low Na diet -Check BMET today  #Frequent PVCs: Noted to have 6.4% burden on Zio monitor. Coreg has since been increased. -Continue carvedilol 9.375mg  BID  #Dizziness/Near Syncope: Suspect related to orthostasis due to episodes occurring while standing in the heat or at church. Reassuring work-up and BP well controlled with no episodes of lows. MRI head without acute pathology. Carotids with no significant disease.  Zio 11/2021 with frequent PVCs (6.4%) but no sustained arrhythmias. Symptoms have significantly improved with increased hydration and cutting back on lasix.  #Essential hypertension: Well controlled and at goal <130/90s. -Continue carvedilol 9.375mg  BID  #CKD: Stable. -Follow-up with PCP as scheduled  #HLD: -Continue atorvastatin 80mg  as above -LDL controlled 36 on 02/2022  Follow Up: 6 months  Medication Adjustments/Labs and Tests Ordered: Current medicines are reviewed at length with the patient today.  Concerns regarding medicines are outlined above.  No orders of the defined types were placed in this encounter.  No orders of the defined types were placed in this encounter.  There are no Patient Instructions on file for this visit.    Signed, Dietrich Pates, MD  03/08/2023 7:15 PM    [facility]

## 2023-03-11 ENCOUNTER — Telehealth: Payer: Self-pay | Admitting: Internal Medicine

## 2023-03-11 ENCOUNTER — Ambulatory Visit: Payer: No Typology Code available for payment source | Admitting: Internal Medicine

## 2023-03-11 DIAGNOSIS — I2510 Atherosclerotic heart disease of native coronary artery without angina pectoris: Secondary | ICD-10-CM

## 2023-03-11 DIAGNOSIS — E782 Mixed hyperlipidemia: Secondary | ICD-10-CM

## 2023-03-11 NOTE — Telephone Encounter (Signed)
I spent over 15 min on the phone with the pt re his appt today with Dr Tenny Craw... he says he has been having knee problems and he is seeing a surgeon 03/19/23 and he wants to make his appt with [place] today Virtual.. I told him best to be in person.. I explained to him this is the first time Dr Tenny Craw is seeing him and he needs VS and ECG based on his cardiac history and especially since he is seeing a Careers adviser for his knee.. he may need clearance.. he says he would rather reschedule... I offered him to come in after he sees the surgeon but he declined.. he will come in tomorrow to see DR Tenny Craw instead.

## 2023-03-11 NOTE — Telephone Encounter (Signed)
Patient has an appointment today at 3:20 PM, but has been having knee problems. Patient would like to know if he can do a virtual visit instead of coming in person. Please advise.

## 2023-03-12 ENCOUNTER — Ambulatory Visit: Payer: No Typology Code available for payment source | Attending: Internal Medicine | Admitting: Internal Medicine

## 2023-03-12 ENCOUNTER — Encounter: Payer: Self-pay | Admitting: Internal Medicine

## 2023-03-12 VITALS — BP 126/82 | HR 71 | Ht 68.0 in | Wt 207.2 lb

## 2023-03-12 DIAGNOSIS — I10 Essential (primary) hypertension: Secondary | ICD-10-CM

## 2023-03-12 DIAGNOSIS — E782 Mixed hyperlipidemia: Secondary | ICD-10-CM

## 2023-03-12 DIAGNOSIS — I2510 Atherosclerotic heart disease of native coronary artery without angina pectoris: Secondary | ICD-10-CM | POA: Diagnosis not present

## 2023-03-12 DIAGNOSIS — I5032 Chronic diastolic (congestive) heart failure: Secondary | ICD-10-CM

## 2023-03-12 DIAGNOSIS — I279 Pulmonary heart disease, unspecified: Secondary | ICD-10-CM | POA: Diagnosis not present

## 2023-03-12 NOTE — Progress Notes (Signed)
Cardiology Office Note:    Date:  03/12/2023   ID:  Lonnie Skinner, DOB 02/16/1961, MRN [id number]  PCP:  Jerre Simon, MD   [facility]  Cardiologist:  Meriam Sprague, MD (Inactive)  Advanced Practice Provider:  No care team member to display Electrophysiologist:  None    Referring MD: Jerre Simon, MD    History of Present Illness:    Lonnie Skinner is a 62 y.o. male with a hx of CAD/NSTEMI s/p DES to [facility] with residual moderate LAD disease in 2015, chronic diastolic heart failure, HTN, HLD, CKD  Admitted back in early 2015 with CAD/NSTEMI - s/p DES to the mRCA, residual moderate LAD disease (if fails on medical therapy would proceed with PCI). He has typically not followed CV risk factor modification.   Was seen by me in clinic on 07/27/20 where he was having chest pain on exertion. Cath 08/02/20 showed critical 95% prox LAD, 70% OM1, 60% OM2, 70% prox RCA with abnormal FFR. He subsequently underwent CABG 07/2020 with LIMA-LAD, S-PDA, S-OM1, S-OM2 with Dr. Cliffton Asters. TTE 08/12/20 with LVEF 60-65%, no WMA.   Was seen on 10/2021 by Tereso Newcomer for dizziness. Recommended for zio monitor which is pending. Carotid ultrasound without significant disease. MRI brain without significant findings.  Was last seen in 01/2022 where he was doing well from a CV standpoint.  Today, the patient feels well. No chest pain, SOB, orthopnea, LE edema, or palpitations. Dizziness has significantly improved with decreasing lasix and increasing fluid intake.    Pt is feeling good  No CP   Breathing is good   Tries to walk but R knee is limiting    Past Medical History:  Diagnosis Date   CAD (coronary artery disease)    a. 05/2013: NSTEMI s/p DES to mRCA (culprit vessel), residual moderate LAD disease (the patient fails medical therapy and this was found to be significant, this would be amenable to PCI).   CHF (congestive heart failure) (HCC)    CKD (chronic kidney disease),  stage II    DVT (deep venous thrombosis) (HCC) 1981   Hypercholesteremia    Hyperglycemia    Hypertension     Past Surgical History:  Procedure Laterality Date   CORONARY ARTERY BYPASS GRAFT N/A 08/09/2020   Procedure: CORONARY ARTERY BYPASS GRAFTING (CABG) X  FOUR   USING  LEFT INTERNAL MAMMARY ARTERY HARVEST AND RIGHT AND LEFT ENDOSCOPIC SAPHENOUS VEIN HARVEST;  Surgeon: Corliss Skains, MD;  Location: [facility] OR;  Service: Open Heart Surgery;  Laterality: N/A;   CORONARY PRESSURE/FFR STUDY N/A 08/02/2020   Procedure: INTRAVASCULAR PRESSURE WIRE/FFR STUDY;  Surgeon: Swaziland, [name], MD;  Location: [facility] INVASIVE CV LAB;  Service: Cardiovascular;  Laterality: N/A;   CORONARY STENT PLACEMENT  05/2013   mRCA   CORONARY/GRAFT ACUTE MI REVASCULARIZATION  2015   KNEE SURGERY Left 2009   LEFT HEART CATH AND CORONARY ANGIOGRAPHY N/A 08/02/2020   Procedure: LEFT HEART CATH AND CORONARY ANGIOGRAPHY;  Surgeon: Swaziland, [name], MD;  Location: [facility] INVASIVE CV LAB;  Service: Cardiovascular;  Laterality: N/A;   LEFT HEART CATHETERIZATION WITH CORONARY ANGIOGRAM N/A 06/02/2013   Procedure: LEFT HEART CATHETERIZATION WITH CORONARY ANGIOGRAM;  Surgeon: Corky Crafts, MD;  Location: [facility] CATH LAB;  Service: Cardiovascular;  Laterality: N/A;   PERCUTANEOUS CORONARY STENT INTERVENTION (PCI-S)  06/02/2013   Procedure: PERCUTANEOUS CORONARY STENT INTERVENTION (PCI-S);  Surgeon: Corky Crafts, MD;  Location: [facility] CATH LAB;  Service: Cardiovascular;;  TEE WITHOUT CARDIOVERSION N/A 08/09/2020   Procedure: TRANSESOPHAGEAL ECHOCARDIOGRAM (TEE);  Surgeon: Corliss Skains, MD;  Location: [facility] OR;  Service: Open Heart Surgery;  Laterality: N/A;    Current Medications: Current Meds  Medication Sig   ALPRAZolam (XANAX) 0.25 MG tablet TAKE 1 TABLET(0.25 MG) BY MOUTH TWICE DAILY AS NEEDED FOR ANXIETY   atorvastatin (LIPITOR) 80 MG tablet Take 1 tablet (80 mg total) by mouth every evening.   benzonatate (TESSALON)  100 MG capsule Take 1 capsule (100 mg total) by mouth 2 (two) times daily as needed for cough.   carvedilol (COREG) 6.25 MG tablet TAKE 1 TABLET(6.25 MG) BY MOUTH TWICE DAILY   clopidogrel (PLAVIX) 75 MG tablet Take 1 tablet (75 mg total) by mouth daily.   diclofenac Sodium (VOLTAREN) 1 % GEL Apply topically.   furosemide (LASIX) 20 MG tablet Take 1 tablet (20 mg total) by mouth daily.   lidocaine (LIDODERM) 5 % Place onto the skin.   nitroGLYCERIN (NITROSTAT) 0.4 MG SL tablet Place 1 tablet (0.4 mg total) under the tongue every 5 (five) minutes as needed for chest pain (up to 3 doses).   spironolactone (ALDACTONE) 25 MG tablet Take 0.5 tablets (12.5 mg total) by mouth daily.   traMADol (ULTRAM) 50 MG tablet Take 1 tablet (50 mg total) by mouth every 4 (four) hours as needed for moderate pain.     Allergies:   Penicillins   Social History   Socioeconomic History   Marital status: Widowed    Spouse name: Not on file   Number of children: 2   Years of education: 48   Highest education level: 11th grade  Occupational History    Comment: Disabled  Tobacco Use   Smoking status: Former    Current packs/day: 0.00    Types: Cigarettes    Quit date: 2007    Years since quitting: 17.8    Passive exposure: Past   Smokeless tobacco: Never  Vaping Use   Vaping status: Never Used  Substance and Sexual Activity   Alcohol use: No   Drug use: No   Sexual activity: Not Currently    Birth control/protection: None  Other Topics Concern   Not on file  Social History Narrative   Patient lives alone in. Patients wife died ~1 year ago.    Patient has support from his son and some friends.    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    Patient recently graduated cardiac rehab and has been going to [employer] 3x per week for ~ 2 hours.    Social Determinants of Health   Financial Resource Strain: Low Risk  (08/16/2022)   Overall Financial Resource Strain (CARDIA)    Difficulty  of Paying Living Expenses: Not hard at all  Food Insecurity: No Food Insecurity (08/16/2022)   Hunger Vital Sign    Worried About Running Out of Food in the Last Year: Never true    Ran Out of Food in the Last Year: Never true  Transportation Needs: No Transportation Needs (08/16/2022)   PRAPARE - Administrator, Civil Service (Medical): No    Lack of Transportation (Non-Medical): No  Physical Activity: Inactive (08/16/2022)   Exercise Vital Sign    Days of Exercise per Week: 0 days    Minutes of Exercise per Session: 0 min  Stress: Stress Concern Present (08/16/2022)   [employer] of Occupational Health - Occupational Stress Questionnaire    Feeling of Stress :  To some extent  Social Connections: Socially Isolated (07/13/2021)   Social Connection and Isolation Panel [NHANES]    Frequency of Communication with Friends and Family: More than three times a week    Frequency of Social Gatherings with Friends and Family: More than three times a week    Attends Religious Services: Never    Database administrator or Organizations: No    Attends Banker Meetings: Never    Marital Status: Widowed     Family History: The patient's family history includes Bone cancer in his father; CAD in an other family member; Diabetes in his mother; Heart attack in his brother and father; Heart disease in his brother; Heart failure in his father. There is no history of Colon cancer, Pancreatic cancer, Liver disease, or Esophageal cancer.  ROS:   Please see the history of present illness.    Review of Systems  Constitutional:  Negative for chills and fever.  HENT:  Negative for hearing loss.   Eyes:  Negative for blurred vision and redness.  Respiratory:  Negative for shortness of breath.   Cardiovascular:  Negative for chest pain, palpitations, orthopnea, claudication, leg swelling and PND.  Gastrointestinal:  Negative for melena, nausea and vomiting.  Genitourinary:  Negative  for dysuria and flank pain.  Musculoskeletal:  Negative for myalgias.  Skin:  Negative for rash.  Neurological:  Negative for dizziness and loss of consciousness.  Endo/Heme/Allergies:  Negative for polydipsia.  Psychiatric/Behavioral:  Negative for depression. The patient is not nervous/anxious.     EKGs/Labs/Other Studies Reviewed:    The following studies were reviewed today: Monitor 11/2021:    Predominant rhythm was 6 days and 12 hours   Predominant rhythm was NSR with average HR 72bpm   There were 11 runs of SVT with longest lasting 9 beats   Frequent VE (6.4%), rare SVE (<1%)   Patient triggered event correlates with VE   No sustained arrhythmias or significant pauses     Patch Wear Time:  6 days and 12 hours (2023-07-07T10:42:58-0400 to 2023-07-13T22:48:15-0400)   Patient had a min HR of 51 bpm, max HR of 129 bpm, and avg HR of 72 bpm. Predominant underlying rhythm was Sinus Rhythm. 11 Supraventricular Tachycardia runs occurred, the run with the fastest interval lasting 9 beats with a max rate of 129 bpm (avg 116  bpm); the run with the fastest interval was also the longest. Isolated SVEs were rare (<1.0%), SVE Couplets were rare (<1.0%), and SVE Triplets were rare (<1.0%). Isolated VEs were frequent (6.4%, [id number]), VE Couplets were rare (<1.0%, 2040), and VE  Triplets were rare (<1.0%, 2). Ventricular Bigeminy and Trigeminy were present.  Myocardial Perfusion Imaging 11/13/2021   The study is normal. The study is low risk.   No ST deviation was noted.   LV perfusion is normal. There is no evidence of ischemia. There is no evidence of infarction.   Left ventricular function is normal. Nuclear stress EF: 56 %. The left ventricular ejection fraction is normal (55-65%). End diastolic cavity size is normal. End systolic cavity size is normal.   Prior study not available for comparison.   Normal stress nuclear study with no ischemia or infarction.  Gated ejection fraction 56% with  normal wall motion.  Carotid Ultrasound 10/2021: Summary:  Right Carotid: The extracranial vessels were near-normal with only minimal  wall                 thickening or plaque.   Left  Carotid: The extracranial vessels were near-normal with only minimal  wall                thickening or plaque.   Vertebrals:  Bilateral vertebral arteries demonstrate antegrade flow.  Subclavians: Normal flow hemodynamics were seen in bilateral subclavian               arteries.   Lower Extremity Venous Reflux 09/20/20 Summary:  Right:  - No evidence of acute deep vein thrombosis seen in the right lower extremity, from the common femoral through the popliteal veins.  - Venous reflux is noted in the right common femoral vein.  - Venous reflux is noted in the right sapheno-femoral junction.  - Venous reflux is noted in the right greater saphenous vein in the proximal thigh.  - Venous reflux is noted in the right short saphenous vein, fossa area.     Left:  - No evidence of acute deep vein thrombosis seen in the left lower extremity, from the common femoral through the popliteal veins.  - No evidence of superficial venous reflux seen in the left greater saphenous vein.  - No evidence of superficial venous reflux seen in the left short saphenous vein.  - Venous reflux is noted in the left common femoral vein.  - Venous reflux is noted in the left sapheno-femoral junction.   Echo TEE 08/09/20 PRE-OP FINDINGS   Left Ventricle: The left ventricle has normal systolic function, with an ejection fraction of 55-60%. The cavity size was normal. There is no increase in left ventricular wall thickness.   Right Ventricle: The right ventricle has normal systolic function. The cavity was normal. There is no increase in right ventricular wall thickness.   Left Atrium: Left atrial size was not assessed. No left atrial/left atrial appendage thrombus was detected.   Right Atrium: Right atrial size was not assessed.    Interatrial Septum: The interatrial septum was not assessed.   Pericardium: A small pericardial effusion is present. The pericardial effusion is localized near the right ventricle.   Mitral Valve: The mitral valve is normal in structure. Mitral valve regurgitation is trivial by color flow Doppler.   Tricuspid Valve: The tricuspid valve was normal in structure. Tricuspid valve regurgitation is trivial by color flow Doppler.   Aortic Valve: The aortic valve is bicuspid Aortic valve regurgitation was not visualized by color flow Doppler. There is no stenosis of the aortic valve.   Pulmonic Valve: The pulmonic valve was normal in structure. Pulmonic valve regurgitation is trivial by color flow Doppler.    Cath 08/02/20: Prox LAD lesion is 95% stenosed. Prox LAD to Mid LAD lesion is 80% stenosed. 1st Mrg lesion is 75% stenosed. 2nd Mrg lesion is 60% stenosed. Prox RCA to Mid RCA lesion is 70% stenosed. Previously placed Mid RCA-1 stent (unknown type) is widely patent. Mid RCA-2 lesion is 50% stenosed. The left ventricular systolic function is normal. LV end diastolic pressure is mildly elevated. The left ventricular ejection fraction is 55-65% by visual estimate.   1. 3 vessel obstructive CAD    - critical 95% proximal LAD    - 70% OM1    - 60% OM2    - 70% mid RCA proximal to prior stent with abnormal RFR 2. Normal LV function 3. Mildly elevated LVEDP   Plan: recommend CABG. Will hold Plavix. Admit to telemetry and treat with IV heparin. Check Echo.   TTE 08/02/20: IMPRESSIONS   1. Left ventricular ejection fraction, by estimation, is  60 to 65%. The left ventricle has normal function. The left ventricle has no regional wall motion abnormalities. Left ventricular diastolic parameters were normal.   2. Right ventricular systolic function is normal. The right ventricular size is normal. Tricuspid regurgitation signal is inadequate for assessing PA pressure.   3. The mitral valve is  normal in structure. No evidence of mitral valve regurgitation. No evidence of mitral stenosis.   4. The aortic valve is normal in structure. Aortic valve regurgitation is not visualized. No aortic stenosis is present.   5. The inferior vena cava is normal in size with greater than 50% respiratory variability, suggesting right atrial pressure of 3 mmHg.   06/18/13  ECHO Study Conclusions  Left ventricle: The cavity size was normal. There was mild focal basal hypertrophy of the septum. Systolic function was normal. The estimated ejection fraction was in the range of 55% to 60%. Wall motion was normal; there were no regional wall motion abnormalities. Features are consistent with a pseudonormal left ventricular filling pattern, with concomitant abnormal relaxation and increased filling pressure (grade 2 diastolic dysfunction).  Impressions:  - No prior study for comparison.  ANGIOGRAPHIC DATA: The left main coronary artery is absent. There appear to be separate ostia of the LAD and circumflex.  The left anterior descending artery is a large vessel which reaches the apex. In the mid vessel, there is a 50-70% lesion in the mid LAD. There is mild to moderate diffuse disease. There 2 large diagonals which are widely patent. The apical LAD is small but patent.  The left circumflex artery is a medium size vessel. There appears to be some vasospasm proximally. There is a large first marginal which is patent. The second marginal is a larger vessel which branches across the lateral wall. This appears widely patent.  The right coronary artery is a large dominant vessel. In the mid vessel, there is a focal 99% stenosis with visible thrombus. The posterior lateral artery is very large and widely patent. The posterior descending artery is widely patent.  LEFT VENTRICULOGRAM: Left ventricular angiogram was not done. LVEDP was 13 mmHg.  PCI NARRATIVE: A JR 4 guiding catheters u used to engage the RCA. Angiomax  was used for anticoagulation. Initially, a pro-water wire was advanced but would not cross the lesion. A Fielder XT density cross the lesion in the mid right coronary artery. A 2.5 x 12 balloon was used to predilate the lesion. A 2.75 x 16 promise drug-eluting stent was then deployed. The stent was post dilated with a 3.25 x 12 noncompliant balloon. There is an excellent angiographic result. There is no residual stenosis. There were several areas of vasospasm during intervention noted in the distal right and posterior lateral artery. Both resolved with intracoronary nitroglycerin.  EKG:  ECG shows NSR, septal infarct, HR 62  Recent Labs: 08/13/2022: BUN 17; Creatinine, Ser 1.40; Hemoglobin 14.3; Platelets 195; Potassium 5.1; Sodium 139   Recent Lipid Panel    Component Value Date/Time   CHOL 93 (L) 02/20/2022 0938   TRIG 91 02/20/2022 0938   HDL 39 (L) 02/20/2022 0938   CHOLHDL 2.4 02/20/2022 0938   CHOLHDL 3.1 02/13/2016 1540   VLDL 21 02/13/2016 1540   LDLCALC 36 02/20/2022 0938     Risk Assessment/Calculations:       Physical Exam:    VS:  BP 126/82   Pulse 71   Ht 5\' 8"  (1.727 m)   Wt 207 lb 3.2 oz (94 kg)   SpO2  97%   BMI 31.50 kg/m     Wt Readings from Last 3 Encounters:  03/12/23 207 lb 3.2 oz (94 kg)  08/13/22 212 lb 6.4 oz (96.3 kg)  05/04/22 212 lb 12.8 oz (96.5 kg)     GEN:  Well nourished, well developed in no acute distress HEENT: Normal NECK: No JVD; No carotid bruit CARDIAC: Irregular, no murmur RESPIRATORY:  Clear to auscultation ABDOMEN: Soft, non-tender  No hepatomegaly        PLAN:    In order of problems listed above:  #Severe multivessel CAD s/p CABG Patient with history of NSTEMI in in 2015 s/p DES to mid-RCA with known residual moderate LAD disease. Presented to clinic with exertional chest pain in 07/2020 found to have multivessel CAD s/p 4v CABG on 07/2020 with L-LAD, S-PDA, S-OM1, S-OM2. Currently, doing well without anginal  symptoms. -Continue plavix 75mg  daily -Continue carvedilol 9.375mg  BID -Continue atorvastatin 80mg  daily  #Chronic diastolic CHF: Stable and euvolemic on examination with NYHA class I symptoms -Continue lasix 20mg  daily -Continue carvedilol 9.375mg  BID -Continue spiro 12.5mg  daily   #Frequent PVCs: Noted to have 6.4% burden on Zio monitor.=. -Keep on carvedilol 6.25   bid   Stay hydrated      #Dizziness/Near Syncope: Pt denies symptoms     #Essential hypertension: BP is well controlled on current regimen    #CKD: Check BMEt today   #HLD: -Continue atorvastatin 80mg  as above -Will get lipomed today  and Apo B  Follow Up: 6 months  Medication Adjustments/Labs and Tests Ordered: Current medicines are reviewed at length with the patient today.  Concerns regarding medicines are outlined above.  No orders of the defined types were placed in this encounter.  No orders of the defined types were placed in this encounter.  There are no Patient Instructions on file for this visit.    Signed, Dietrich Pates, MD  03/12/2023 2:12 PM    [facility]

## 2023-03-12 NOTE — Patient Instructions (Signed)
Medication Instructions:   *If you need a refill on your cardiac medications before your next appointment, please call your pharmacy*   Lab Work: Cbc, cmet, nmr, apo b  If you have labs (blood work) drawn today and your tests are completely normal, you will receive your results only by: MyChart Message (if you have MyChart) OR A paper copy in the mail If you have any lab test that is abnormal or we need to change your treatment, we will call you to review the results.   Testing/Procedures:    Follow-Up: At [facility], you and your health needs are our priority.  As part of our continuing mission to provide you with exceptional heart care, we have created designated Provider Care Teams.  These Care Teams include your primary Cardiologist (physician) and Advanced Practice Providers (APPs -  Physician Assistants and Nurse Practitioners) who all work together to provide you with the care you need, when you need it.  We recommend signing up for the patient portal called "MyChart".  Sign up information is provided on this After Visit Summary.  MyChart is used to connect with patients for Virtual Visits (Telemedicine).  Patients are able to view lab/test results, encounter notes, upcoming appointments, etc.  Non-urgent messages can be sent to your provider as well.   To learn more about what you can do with MyChart, go to [email or web address].    Your next appointment:  end of April

## 2023-03-14 LAB — CBC
Hematocrit: 45.7 % (ref 37.5–51.0)
Hemoglobin: 15.7 g/dL (ref 13.0–17.7)
MCH: 32.4 pg (ref 26.6–33.0)
MCHC: 34.4 g/dL (ref 31.5–35.7)
MCV: 94 fL (ref 79–97)
Platelets: 213 10*3/uL (ref 150–450)
RBC: 4.84 x10E6/uL (ref 4.14–5.80)
RDW: 13.6 % (ref 11.6–15.4)
WBC: 6.4 10*3/uL (ref 3.4–10.8)

## 2023-03-14 LAB — NMR, LIPOPROFILE
Cholesterol, Total: 161 mg/dL (ref 100–199)
HDL Particle Number: 26.6 umol/L — ABNORMAL LOW (ref >=30.5)
HDL-C: 43 mg/dL (ref >39)
LDL Particle Number: 895 nmol/L (ref <1000)
LDL Size: 21 nm (ref >20.5)
LDL-C (NIH Calc): 105 mg/dL — ABNORMAL HIGH (ref 0–99)
LP-IR Score: 34 (ref <=45)
Small LDL Particle Number: 241 nmol/L (ref <=527)
Triglycerides: 67 mg/dL (ref 0–149)

## 2023-03-14 LAB — BASIC METABOLIC PANEL
BUN/Creatinine Ratio: 12 (ref 10–24)
BUN: 18 mg/dL (ref 8–27)
CO2: 24 mmol/L (ref 20–29)
Calcium: 9.2 mg/dL (ref 8.6–10.2)
Chloride: 102 mmol/L (ref 96–106)
Creatinine, Ser: 1.53 mg/dL — ABNORMAL HIGH (ref 0.76–1.27)
Glucose: 77 mg/dL (ref 70–99)
Potassium: 4.4 mmol/L (ref 3.5–5.2)
Sodium: 138 mmol/L (ref 134–144)
eGFR: 51 mL/min/{1.73_m2} — ABNORMAL LOW (ref >59)

## 2023-03-14 LAB — APOLIPOPROTEIN B: Apolipoprotein B: 80 mg/dL (ref <90)

## 2023-03-15 ENCOUNTER — Other Ambulatory Visit: Payer: Self-pay

## 2023-03-15 DIAGNOSIS — E782 Mixed hyperlipidemia: Secondary | ICD-10-CM

## 2023-03-18 NOTE — Telephone Encounter (Signed)
Lonnie Riffle, MD 03/13/2023  5:24 PM EDT     Cr is mildly increased  1.53   I would stop furosemide   Follow BMET in 2 wks CBC is normal Lpids are pending still   Lonnie Simpler, MD 03/14/2023 10:35 PM EDT     LDL 105   With CAD I would recomm Repatha   Goal for LDL less than 70   Follow up lipomed and liver panel in 8 wks   The patient has been notified of the result and verbalized understanding.  All questions (if any) were answered. [name], RN 03/18/2023 4:56 PM   Patient states that he has not been taking his atorvastatin daily. Encouraged him to make sure he is taking that daily. He is hesitant about seeing PharmD and would prefer to try and be better about taking his atorvastatin regularly.

## 2023-03-18 NOTE — Telephone Encounter (Signed)
Pt called and stated someone was suppose to call him back today about lab results   [phone]

## 2023-03-18 NOTE — Addendum Note (Signed)
Addended by: [name] on: 03/18/2023 05:03 PM   Modules accepted: Orders

## 2023-03-26 ENCOUNTER — Ambulatory Visit (INDEPENDENT_AMBULATORY_CARE_PROVIDER_SITE_OTHER): Payer: No Typology Code available for payment source | Admitting: Student

## 2023-03-26 ENCOUNTER — Encounter: Payer: Self-pay | Admitting: Student

## 2023-03-26 VITALS — BP 132/60 | HR 59 | Wt 207.6 lb

## 2023-03-26 DIAGNOSIS — Z008 Encounter for other general examination: Secondary | ICD-10-CM | POA: Diagnosis not present

## 2023-03-26 DIAGNOSIS — I2510 Atherosclerotic heart disease of native coronary artery without angina pectoris: Secondary | ICD-10-CM | POA: Diagnosis not present

## 2023-03-26 DIAGNOSIS — G8929 Other chronic pain: Secondary | ICD-10-CM

## 2023-03-26 DIAGNOSIS — N1831 Chronic kidney disease, stage 3a: Secondary | ICD-10-CM | POA: Diagnosis not present

## 2023-03-26 DIAGNOSIS — M25561 Pain in right knee: Secondary | ICD-10-CM

## 2023-03-26 DIAGNOSIS — M1990 Unspecified osteoarthritis, unspecified site: Secondary | ICD-10-CM | POA: Diagnosis not present

## 2023-03-26 MED ORDER — ACETAMINOPHEN ER 650 MG PO TBCR: 650.0000 mg | EXTENDED_RELEASE_TABLET | Freq: Three times a day (TID) | ORAL | 0 refills | Status: DC | PRN

## 2023-03-26 NOTE — Progress Notes (Signed)
    SUBJECTIVE:   CHIEF COMPLAINT / HPI:   62 year old male presenting today for an right knee pain -Pain has been present for few months -They are mostly constant and on the medial aspect of the knee -Denies any buckling or knee giving out. -Send follow trauma to the knee -Previously evaluated at  the [place] -Informed that the [place] that he has arthritis in his knee after x-ray and MRI -Was prescribed lidocaine patch and Voltaren gel which patient has been using -Has physical therapy appointment scheduled for November 24  PERTINENT  PMH / PSH: Reviewed   OBJECTIVE:   BP 132/60   Pulse (!) 59   Wt 207 lb 9.6 oz (94.2 kg)   SpO2 97%   BMI 31.57 kg/m      Physical Exam General: Alert, well appearing, NAD  Right Knee Exam No effusion.  No other gross deformity, ecchymoses. TTP media joint line. FROM with normal strength. Negative ant/post drawers. Negative valgus/varus testing. Negative lachman. Negative mcmurrays, apleys, thessalys. NV intact distally.   ASSESSMENT/PLAN:   Chronic pain of right knee Suspect patient's symptoms are more consistent with arthritis of his right knee.  Unable to see his x-ray and MRI from the [place].  Unfortunately not a good candidate for NSAID or other anti-inflammatory medication given recent renal labs and history of cardiac disease.  Will recommend use of Tylenol for pain control and intra-articular steroid injection to the patient.  Discussed plan with patient who verbalized understanding and we will call clinic to schedule an appointment for steroid injection once he makes his decision. -Rx Tylenol 650 mg Q6H PRN -Continue lidocaine patch and Voltaren gel -Discussed intra-articular steroid injections, patient will call to schedule appointment -Encourage patient to follow-up with physical therapy     Jerre Simon, MD [facility]

## 2023-03-26 NOTE — Assessment & Plan Note (Signed)
Suspect patient's symptoms are more consistent with arthritis of his right knee.  Unable to see his x-ray and MRI from the [place].  Unfortunately not a good candidate for NSAID or other anti-inflammatory medication given recent renal labs and history of cardiac disease.  Will recommend use of Tylenol for pain control and intra-articular steroid injection to the patient.  Discussed plan with patient who verbalized understanding and we will call clinic to schedule an appointment for steroid injection once he makes his decision. -Rx Tylenol 650 mg Q6H PRN -Continue lidocaine patch and Voltaren gel -Discussed intra-articular steroid injections, patient will call to schedule appointment -Encourage patient to follow-up with physical therapy

## 2023-03-26 NOTE — Patient Instructions (Signed)
It was wonderful to see you today. Thank you for allowing me to be a part of your care. Below is a short summary of what we discussed at your visit today:  Your right knee pain is most likely due to your arthritis.  Given your kidney function and your heart problem I will not be prescribing any anti-inflammatories today.  However I have sent in prescription for high-dose Tylenol which you can take every 6 hours as needed for pain.  You most likely benefit from a knee steroid injection.  Please schedule an appointment so that you can get the knee injection.  Also continue to do the Voltaren gel and lidocaine patch as needed  If you have any questions or concerns, please do not hesitate to contact us via phone or MyChart message.   Jerre Simon, MD [facility]

## 2023-03-28 ENCOUNTER — Ambulatory Visit (INDEPENDENT_AMBULATORY_CARE_PROVIDER_SITE_OTHER): Payer: No Typology Code available for payment source | Admitting: Student

## 2023-03-28 ENCOUNTER — Encounter: Payer: Self-pay | Admitting: Student

## 2023-03-28 VITALS — BP 140/96 | HR 73 | Ht 68.0 in | Wt 207.1 lb

## 2023-03-28 DIAGNOSIS — M25561 Pain in right knee: Secondary | ICD-10-CM

## 2023-03-28 DIAGNOSIS — I10 Essential (primary) hypertension: Secondary | ICD-10-CM

## 2023-03-28 MED ORDER — METHYLPREDNISOLONE ACETATE 40 MG/ML IJ SUSP
40.0000 mg | Freq: Once | INTRAMUSCULAR | Status: AC
  Administered 2023-03-28: 40 mg via INTRAMUSCULAR

## 2023-03-28 NOTE — Progress Notes (Signed)
Attending Supervision of Procedure Note: I have examined the patient, reviewed their chart, discussed with the resident physician and agree with the planned knee injection.  I supervised and was present for the key portions of the injection

## 2023-03-28 NOTE — Assessment & Plan Note (Signed)
BP today elevated x 2.  Patient reports he has not taken his BP medication and suspect his pain could also be attributing to his elevated BP.  Currently asymptomatic, no chest pain, headache, vision changes or shortness of breath. -Encourage compliance with medication -Advised patient to check home BPs daily and if elevated above 130/80 we will return to reassess need to adjust BP med.

## 2023-03-28 NOTE — Progress Notes (Signed)
    SUBJECTIVE:   CHIEF COMPLAINT / HPI:   Patient is a 62 year old male presenting today for intra-articular knee injection.  He has history of chronic right knee pain.  [facility] and informed the patient of osteoarthritis of the knee.  He has history of CKD and so is a poor candidate for NSAID and intra-articular knee injections was discussed with patient who at the time I agreed to schedule follow-up appointment for a knee injection.  PERTINENT  PMH / PSH: Reviewed   OBJECTIVE:   BP (!) 140/96   Pulse 73   Ht 5\' 8"  (1.727 m)   Wt 207 lb 2 oz (94 kg)   SpO2 100%   BMI 31.49 kg/m    Physical Exam General: Alert, well appearing, NAD Cardiovascular: RRR, No Murmurs, Normal S2/S2 Respiratory: CTAB, No wheezing or Rales Abdomen: No distension or tenderness Right knee: No notable erythema, edema or deformity of the right knee.  Does have tenderness with palpation on the medial aspect of the knee.  ASSESSMENT/PLAN:   Hypertension BP today elevated x 2.  Patient reports he has not taken his BP medication and suspect his pain could also be attributing to his elevated BP.  Currently asymptomatic, no chest pain, headache, vision changes or shortness of breath. -Encourage compliance with medication -Advised patient to check home BPs daily and if elevated above 130/80 we will return to reassess need to adjust BP med.  PROCEDURE: LEFT KNEE INJECTION: Patient was given informed consent, signed copy in the chart. Appropriate time out was taken. Area prepped in usual sterile fashion. Ethyl chloride was  used for local anesthesia. A 21 gauge 1 1/2 inch needle was used. 1 cc of methylprednisolone 40 mg/ml plus 3 cc of 1% lidocaine without epinephrine was injected into the left knee using a(n) anteromedial approach. The patient tolerated the procedure well. There were no complications. Post procedure instructions were given.  PROCEDURE After informed  written consent timeout was performed, patient was seated in chair in exam room. Right knee was prepped with alcohol swab and utilizing anteromedial approach, patient's right knee was injected intraarticularly with 3:1 bupivicaine: depomedrol.  Patient tolerated the procedure well without immediate complications.     Jerre Simon, MD [facility]

## 2023-03-28 NOTE — Patient Instructions (Signed)
It was wonderful to see you today. Thank you for allowing me to be a part of your care. Below is a short summary of what we discussed at your visit today:  Today we gave you a steroid injection on your right knee for your arthritis of the knee.  Might have some irritation of feel some tightness in the knee.  It is okay to continue using Tylenol for the pain.  Also do cold compress over the knee as well.  Your blood pressure today was elevated.  Please make sure you are taking your medications as prescribed.  Check your blood pressures at home if it is above 130/80 please return so that we can make adjustments to your blood pressure meds.    If you have any questions or concerns, please do not hesitate to contact us via phone or MyChart message.   Jerre Simon, MD [facility]

## 2023-04-01 ENCOUNTER — Other Ambulatory Visit: Payer: No Typology Code available for payment source

## 2023-04-04 ENCOUNTER — Ambulatory Visit: Payer: No Typology Code available for payment source | Attending: Internal Medicine

## 2023-04-04 ENCOUNTER — Ambulatory Visit: Payer: No Typology Code available for payment source | Admitting: Student

## 2023-04-04 DIAGNOSIS — I2510 Atherosclerotic heart disease of native coronary artery without angina pectoris: Secondary | ICD-10-CM

## 2023-04-04 DIAGNOSIS — E782 Mixed hyperlipidemia: Secondary | ICD-10-CM | POA: Diagnosis not present

## 2023-04-04 NOTE — Progress Notes (Deleted)
Patient ID: AKILAN NOTO                 DOB: 1960/07/22                    MRN: [id number]      HPI: Lonnie Skinner is a 62 y.o. male patient referred to lipid clinic by ***. PMH is significant for hx of MI, diastolic CHF, CAD, chronic venus insufficiency, unstable angina, HTN, CKD Stage 2   2015 -CAD/NSTEMI - s/p DES to the mRCA, residual moderate LAD disease,Was seen by me in clinic on 07/27/20 where he was having chest pain on exertion. Cath 08/02/20 showed critical 95% prox LAD, 70% OM1, 60% OM2, 70% prox RCA with abnormal FFR. He subsequently underwent CABG 07/2020 with LIMA-LAD, S-PDA, S-OM1, S-OM2 with Dr. Cliffton Asters. TTE 08/12/20 with LVEF 60-65%, no WMA.   Reviewed options for lowering LDL cholesterol, including ezetimibe, PCSK-9 inhibitors, bempedoic acid and inclisiran.  Discussed mechanisms of action, dosing, side effects and potential decreases in LDL cholesterol.  Also reviewed cost information and potential options for patient assistance.  Current Medications: Lipitor 80 mg daily,  Intolerances:  Risk Factors: family hx of ASCVD, premature CAD (age 70  69- CAD/NSTEMI - s/p DES to the mRCA, residual moderate LAD disease ) , diastolic CHF, CAD, chronic venus insufficiency, unstable angina, HTN, CKD Stage 2   LDL goal: <55 mg/dl  Last lab: LDLc 884, TG 67 HDL 43 TC 161  Diet:   Exercise:   Family History:  Relation Problem Comments  Mother (Deceased) Diabetes     Father (Deceased) Bone cancer   Heart attack   Heart failure     Brother (Alive) Heart attack   Heart disease cabg    Maternal Grandmother (Deceased)   Maternal Grandfather (Deceased)   Paternal Grandmother (Deceased)   Paternal Grandfather (Deceased)   Daughter Metallurgist)   Other CAD Multiple family members with CABG      Social History:   Labs: Lipid Panel     Component Value Date/Time   CHOL 93 (L) 02/20/2022 0938   TRIG 91 02/20/2022 0938   HDL 39 (L) 02/20/2022 0938   CHOLHDL 2.4 02/20/2022 0938    CHOLHDL 3.1 02/13/2016 1540   VLDL 21 02/13/2016 1540   LDLCALC 36 02/20/2022 0938   LABVLDL 18 02/20/2022 0938    Past Medical History:  Diagnosis Date   CAD (coronary artery disease)    a. 05/2013: NSTEMI s/p DES to mRCA (culprit vessel), residual moderate LAD disease (the patient fails medical therapy and this was found to be significant, this would be amenable to PCI).   CHF (congestive heart failure) (HCC)    CKD (chronic kidney disease), stage II    DVT (deep venous thrombosis) (HCC) 1981   Hypercholesteremia    Hyperglycemia    Hypertension     Current Outpatient Medications on File Prior to Visit  Medication Sig Dispense Refill   acetaminophen (TYLENOL 8 HOUR ARTHRITIS PAIN) 650 MG CR tablet Take 1 tablet (650 mg total) by mouth every 8 (eight) hours as needed for pain. 100 tablet 0   ALPRAZolam (XANAX) 0.25 MG tablet TAKE 1 TABLET(0.25 MG) BY MOUTH TWICE DAILY AS NEEDED FOR ANXIETY 60 tablet 0   aspirin 81 MG EC tablet Take 1 tablet (81 mg total) by mouth daily. (Patient not taking: Reported on 08/16/2022) 30 tablet    atorvastatin (LIPITOR) 80 MG tablet Take 1 tablet (80 mg total) by mouth  every evening. 90 tablet 3   benzonatate (TESSALON) 100 MG capsule Take 1 capsule (100 mg total) by mouth 2 (two) times daily as needed for cough. 20 capsule 0   carvedilol (COREG) 6.25 MG tablet TAKE 1 TABLET(6.25 MG) BY MOUTH TWICE DAILY 180 tablet 3   clopidogrel (PLAVIX) 75 MG tablet Take 1 tablet (75 mg total) by mouth daily. 90 tablet 3   diclofenac Sodium (VOLTAREN) 1 % GEL Apply topically.     lidocaine (LIDODERM) 5 % Place onto the skin.     nitroGLYCERIN (NITROSTAT) 0.4 MG SL tablet Place 1 tablet (0.4 mg total) under the tongue every 5 (five) minutes as needed for chest pain (up to 3 doses). 25 tablet 3   spironolactone (ALDACTONE) 25 MG tablet Take 0.5 tablets (12.5 mg total) by mouth daily. 45 tablet 3   traMADol (ULTRAM) 50 MG tablet Take 1 tablet (50 mg total) by mouth every  4 (four) hours as needed for moderate pain. 30 tablet 0   No current facility-administered medications on file prior to visit.    Allergies  Allergen Reactions   Penicillins     Unknown reaction    Assessment/Plan:  1. Hyperlipidemia -  No problems updated. No problem-specific Assessment & Plan notes found for this encounter.    Thank you,  Carmela Hurt, Pharm.D [facility] A Division of [facility] [address] [place], [place] [address]  Phone: [phone]; Fax: [phone]

## 2023-04-05 ENCOUNTER — Telehealth: Payer: Self-pay

## 2023-04-05 LAB — LIPID PANEL
Chol/HDL Ratio: 2.3 ratio (ref 0.0–5.0)
Cholesterol, Total: 115 mg/dL (ref 100–199)
HDL: 51 mg/dL
LDL Chol Calc (NIH): 42 mg/dL (ref 0–99)
Triglycerides: 124 mg/dL (ref 0–149)
VLDL Cholesterol Cal: 22 mg/dL (ref 5–40)

## 2023-04-05 LAB — NMR, LIPOPROFILE
Cholesterol, Total: 111 mg/dL (ref 100–199)
HDL Particle Number: 29.8 umol/L — ABNORMAL LOW
HDL-C: 51 mg/dL
LDL Particle Number: 352 nmol/L
LDL Size: 21.3 nm
LDL-C (NIH Calc): 39 mg/dL (ref 0–99)
LP-IR Score: 44
Small LDL Particle Number: <90 nmol/L
Triglycerides: 118 mg/dL (ref 0–149)

## 2023-04-05 LAB — BASIC METABOLIC PANEL WITH GFR
BUN/Creatinine Ratio: 15 (ref 10–24)
BUN: 20 mg/dL (ref 8–27)
CO2: 21 mmol/L (ref 20–29)
Calcium: 9.6 mg/dL (ref 8.6–10.2)
Chloride: 98 mmol/L (ref 96–106)
Creatinine, Ser: 1.36 mg/dL — ABNORMAL HIGH (ref 0.76–1.27)
Glucose: 78 mg/dL (ref 70–99)
Potassium: 4.6 mmol/L (ref 3.5–5.2)
Sodium: 140 mmol/L (ref 134–144)
eGFR: 58 mL/min/{1.73_m2} — ABNORMAL LOW

## 2023-04-05 NOTE — Telephone Encounter (Signed)
Patient calls nurse line reporting continued knee discomfort.   He reports he received a steroid injection on 11/7 and is hoping he can get another one.   Patient advised he can not have multiple injections within 3 months.  He reports he has been taking Tylenol as advised by PCP, however reports it is not helping.   Patient is requesting an alternative medication.   Looks like he has an apt on 11/20 already scheduled.   Will forward to PCP.

## 2023-04-08 ENCOUNTER — Telehealth: Payer: Self-pay | Admitting: Pharmacist

## 2023-04-08 NOTE — Telephone Encounter (Signed)
Patient was in to see PharmD for lipid clinic on Thursday 04/04/2023. Patient reported his last lab in Oct 2024 LDLc was elevated because he had stopped taking Lipitor 80 mg. After appointment with Dr.Ross he has restarted Lipitor and the updated lab was done on Thursday. Discussed updated lipid lab with patient over the phone. LDLc at goal advised to continue with Lipitor 80 mg daily. Patient was appreciative of the phone call and happy that he no need to take injections.

## 2023-04-10 ENCOUNTER — Ambulatory Visit (INDEPENDENT_AMBULATORY_CARE_PROVIDER_SITE_OTHER): Payer: No Typology Code available for payment source | Admitting: Student

## 2023-04-10 VITALS — BP 119/70 | HR 72 | Wt 203.4 lb

## 2023-04-10 DIAGNOSIS — M25561 Pain in right knee: Secondary | ICD-10-CM

## 2023-04-10 DIAGNOSIS — G8929 Other chronic pain: Secondary | ICD-10-CM

## 2023-04-10 NOTE — Patient Instructions (Signed)
Nice to see you.  Sorry to hear that the steroid shot has not improved your knee pain.  Being that you are still having knee pain and I have put in a referral to see an orthopedic surgeon to evaluate your knee.

## 2023-04-10 NOTE — Progress Notes (Signed)
    SUBJECTIVE:   CHIEF COMPLAINT / HPI:   Patient is a 62 year old male presenting today for continued right knee pain.  He had intra-articular joint injection all on 11/7 about 2 weeks ago.  However he reports he has continued to have right knee pain without any improvement.  PERTINENT  PMH / PSH: Reviewed   OBJECTIVE:   BP 119/70   Pulse 72   Wt 203 lb 6.4 oz (92.3 kg)   SpO2 96%   BMI 30.93 kg/m    Physical Exam General: Alert, well appearing, NAD  Knee Exam No effusion.  No other gross deformity, ecchymoses. TTP medial joint line. FROM with normal strength. Negative ant/post drawers. Negative valgus/varus testing. Negative lachman. NV intact distally.   ASSESSMENT/PLAN:   Right knee pain Noted to have severe Arthritis by his provider at the [facility] (unfortunately unable to see their records).Given patient still endorses significant knee pain after intraarticular steroid injection and unable to use oral NSAID at this time due to kidney function would place referral to orthopedic surgery for further evaluation and management.   Jerre Simon, MD [facility]

## 2023-04-11 ENCOUNTER — Encounter: Payer: Self-pay | Admitting: Student

## 2023-06-12 DIAGNOSIS — M2391 Unspecified internal derangement of right knee: Secondary | ICD-10-CM | POA: Diagnosis not present

## 2023-07-12 DIAGNOSIS — S83281A Other tear of lateral meniscus, current injury, right knee, initial encounter: Secondary | ICD-10-CM | POA: Diagnosis not present

## 2023-07-12 DIAGNOSIS — S83241A Other tear of medial meniscus, current injury, right knee, initial encounter: Secondary | ICD-10-CM | POA: Diagnosis not present

## 2023-07-31 DIAGNOSIS — M25561 Pain in right knee: Secondary | ICD-10-CM | POA: Diagnosis not present

## 2023-08-19 ENCOUNTER — Other Ambulatory Visit: Payer: Self-pay

## 2023-08-19 ENCOUNTER — Telehealth: Payer: Self-pay | Admitting: Internal Medicine

## 2023-08-19 DIAGNOSIS — E785 Hyperlipidemia, unspecified: Secondary | ICD-10-CM

## 2023-08-19 DIAGNOSIS — R55 Syncope and collapse: Secondary | ICD-10-CM

## 2023-08-19 DIAGNOSIS — R42 Dizziness and giddiness: Secondary | ICD-10-CM

## 2023-08-19 DIAGNOSIS — E782 Mixed hyperlipidemia: Secondary | ICD-10-CM

## 2023-08-19 DIAGNOSIS — I10 Essential (primary) hypertension: Secondary | ICD-10-CM

## 2023-08-19 DIAGNOSIS — I5032 Chronic diastolic (congestive) heart failure: Secondary | ICD-10-CM

## 2023-08-19 DIAGNOSIS — I2510 Atherosclerotic heart disease of native coronary artery without angina pectoris: Secondary | ICD-10-CM

## 2023-08-19 MED ORDER — SPIRONOLACTONE 25 MG PO TABS
12.5000 mg | ORAL_TABLET | Freq: Every day | ORAL | 1 refills | Status: DC
Start: 2023-08-19 — End: 2023-10-29

## 2023-08-19 MED ORDER — SPIRONOLACTONE 25 MG PO TABS
12.5000 mg | ORAL_TABLET | Freq: Every day | ORAL | 3 refills | Status: DC
Start: 2023-08-19 — End: 2023-08-19

## 2023-08-19 MED ORDER — CARVEDILOL 6.25 MG PO TABS: 6.2500 mg | ORAL_TABLET | Freq: Two times a day (BID) | ORAL | 3 refills | Status: DC

## 2023-08-19 MED ORDER — CARVEDILOL 6.25 MG PO TABS: 6.2500 mg | ORAL_TABLET | Freq: Two times a day (BID) | ORAL | 1 refills | Status: DC

## 2023-08-19 MED ORDER — CLOPIDOGREL BISULFATE 75 MG PO TABS: 75.0000 mg | ORAL_TABLET | Freq: Every day | ORAL | 1 refills | Status: DC

## 2023-08-19 NOTE — Telephone Encounter (Signed)
*  STAT* If patient is at the pharmacy, call can be transferred to refill team.   1. Which medications need to be refilled? (please list name of each medication and dose if known)   carvedilol (COREG) 6.25 MG tablet  clopidogrel (PLAVIX) 75 MG tablet  spironolactone (ALDACTONE) 25 MG tablet   2. Would you like to learn more about the convenience, safety, & potential cost savings by using the [facility]?   3. Are you open to using the [facility] (Type [facility]. ).  4. Which pharmacy/location (including street and city if local pharmacy) is medication to be sent to?  [id number] - [place], [place] - [address] AT [facility] OF [address]   5. Do they need a 30 day or 90 day supply?   90 day  Patient stated he is completely out of these medications.

## 2023-08-19 NOTE — Telephone Encounter (Signed)
 Pt's medications were sent to pt's pharmacy as requested. Confirmation received.

## 2023-08-21 ENCOUNTER — Other Ambulatory Visit: Payer: Self-pay

## 2023-08-21 DIAGNOSIS — I10 Essential (primary) hypertension: Secondary | ICD-10-CM

## 2023-08-21 DIAGNOSIS — E782 Mixed hyperlipidemia: Secondary | ICD-10-CM

## 2023-08-21 DIAGNOSIS — R42 Dizziness and giddiness: Secondary | ICD-10-CM

## 2023-08-21 DIAGNOSIS — R55 Syncope and collapse: Secondary | ICD-10-CM

## 2023-08-21 DIAGNOSIS — I2510 Atherosclerotic heart disease of native coronary artery without angina pectoris: Secondary | ICD-10-CM

## 2023-08-21 DIAGNOSIS — E785 Hyperlipidemia, unspecified: Secondary | ICD-10-CM

## 2023-08-21 DIAGNOSIS — I5032 Chronic diastolic (congestive) heart failure: Secondary | ICD-10-CM

## 2023-08-21 MED ORDER — ATORVASTATIN CALCIUM 80 MG PO TABS
80.0000 mg | ORAL_TABLET | Freq: Every evening | ORAL | 1 refills | Status: DC
Start: 2023-08-21 — End: 2023-10-29

## 2023-09-23 ENCOUNTER — Ambulatory Visit: Payer: No Typology Code available for payment source

## 2023-09-23 VITALS — Ht 68.0 in

## 2023-09-23 DIAGNOSIS — Z0000 Encounter for general adult medical examination without abnormal findings: Secondary | ICD-10-CM | POA: Diagnosis not present

## 2023-09-23 NOTE — Patient Instructions (Addendum)
 Lonnie Skinner , Thank you for taking time to come for your Medicare Wellness Visit. I appreciate your ongoing commitment to your health goals. Please review the following plan we discussed and let me know if I can assist you in the future.   Referrals/Orders/Follow-Ups/Clinician Recommendations: Yes, keep maintaining your health by keeping your appointments with Dr. Goble Last  and any specialists that you may see.    Recommendations from Nurse Wilhelmenia Addis: Aim for 30 minutes of exercise or brisk walking 5 days per week.  Drink 6-8 glasses of water  each day. Eat 5-6 servings of fresh vegetables and fruits per day. Continue to do brain exercises such as reading, puzzles, games on your phone to help keep the brain sharp and active.  This is a list of the screening recommended for you and due dates:  Health Maintenance  Topic Date Due   COVID-19 Vaccine (1) Never done   Flu Shot  12/20/2023   Medicare Annual Wellness Visit  09/22/2024   Colon Cancer Screening  09/26/2024   DTaP/Tdap/Td vaccine (2 - Td or Tdap) 11/18/2028   Pneumococcal Vaccination  Completed   Hepatitis C Screening  Completed   HIV Screening  Completed   Zoster (Shingles) Vaccine  Completed   HPV Vaccine  Aged Out   Meningitis B Vaccine  Aged Out    Advanced directives: (Declined) Advance directive discussed with you today. Even though you declined this today, please call our office should you change your mind, and we can give you the proper paperwork for you to fill out.  Next Medicare Annual Wellness Visit scheduled for next year: Yes  Have you seen your provider in the last 6 months (3 months if uncontrolled diabetes)? Yes, 09/24/2024 at 9:50 a.m. PHONE VISIT with Nurse Health Advisor.

## 2023-09-23 NOTE — Progress Notes (Signed)
 Because this visit was a virtual/telehealth visit,  certain criteria was not obtained, such a blood pressure, CBG if applicable, and timed get up and go. Any medications not marked as "taking" were not mentioned during the medication reconciliation part of the visit. Any vitals not documented were not able to be obtained due to this being a telehealth visit or patient was unable to self-report a recent blood pressure reading due to a lack of equipment at home via telehealth. Vitals that have been documented are verbally provided by the patient.   Subjective:   BRADDOCK THORNGREN is a 63 y.o. who presents for a Medicare Wellness preventive visit.  Visit Complete: Virtual I connected with  [name] on 09/23/23 by a audio enabled telemedicine application and verified that I am speaking with the correct person using two identifiers.  Patient Location: Home  Provider Location: Office/Clinic  I discussed the limitations of evaluation and management by telemedicine. The patient expressed understanding and agreed to proceed.  Vital Signs: Because this visit was a virtual/telehealth visit, some criteria may be missing or patient reported. Any vitals not documented were not able to be obtained and vitals that have been documented are patient reported.  VideoDeclined- This patient declined Librarian, academic. Therefore the visit was completed with audio only.  Persons Participating in Visit: Patient.  AWV Questionnaire: No: Patient Medicare AWV questionnaire was not completed prior to this visit.  Cardiac Risk Factors include: advanced age (>14men, >10 women);sedentary lifestyle;dyslipidemia;family history of premature cardiovascular disease;hypertension;male gender     Objective:    Today's Vitals   09/23/23 0956  Height: 5\' 8"  (1.727 m)  PainSc: 0-No pain   Body mass index is 30.93 kg/m.     09/23/2023    9:59 AM 03/28/2023   10:01 AM 03/26/2023    9:42 AM  08/16/2022   10:26 AM 05/04/2022   11:45 AM 11/28/2021    9:38 AM 11/03/2021    2:02 PM  Advanced Directives  Does Patient Have a Medical Advance Directive? No No No No No No No  Would patient like information on creating a medical advance directive? No - Patient declined No - Patient declined  No - Patient declined No - Patient declined Yes (MAU/Ambulatory/Procedural Areas - Information given) No - Patient declined    Current Medications (verified) Outpatient Encounter Medications as of 09/23/2023  Medication Sig   acetaminophen  (TYLENOL  8 HOUR ARTHRITIS PAIN) 650 MG CR tablet Take 1 tablet (650 mg total) by mouth every 8 (eight) hours as needed for pain.   ALPRAZolam  (XANAX ) 0.25 MG tablet TAKE 1 TABLET(0.25 MG) BY MOUTH TWICE DAILY AS NEEDED FOR ANXIETY   aspirin  81 MG EC tablet Take 1 tablet (81 mg total) by mouth daily. (Patient not taking: Reported on 08/16/2022)   atorvastatin  (LIPITOR ) 80 MG tablet Take 1 tablet (80 mg total) by mouth every evening.   benzonatate  (TESSALON ) 100 MG capsule Take 1 capsule (100 mg total) by mouth 2 (two) times daily as needed for cough.   carvedilol  (COREG ) 6.25 MG tablet Take 1 tablet (6.25 mg total) by mouth 2 (two) times daily with a meal.   clopidogrel  (PLAVIX ) 75 MG tablet Take 1 tablet (75 mg total) by mouth daily.   diclofenac Sodium (VOLTAREN) 1 % GEL Apply topically.   lidocaine  (LIDODERM ) 5 % Place onto the skin.   nitroGLYCERIN  (NITROSTAT ) 0.4 MG SL tablet Place 1 tablet (0.4 mg total) under the tongue every 5 (five) minutes  as needed for chest pain (up to 3 doses).   spironolactone  (ALDACTONE ) 25 MG tablet Take 0.5 tablets (12.5 mg total) by mouth daily.   traMADol  (ULTRAM ) 50 MG tablet Take 1 tablet (50 mg total) by mouth every 4 (four) hours as needed for moderate pain.   No facility-administered encounter medications on file as of 09/23/2023.    Allergies (verified) Penicillins   History: Past Medical History:  Diagnosis Date   CAD  (coronary artery disease)    a. 05/2013: NSTEMI s/p DES to mRCA (culprit vessel), residual moderate LAD disease (the patient fails medical therapy and this was found to be significant, this would be amenable to PCI).   CHF (congestive heart failure) (HCC)    CKD (chronic kidney disease), stage II    DVT (deep venous thrombosis) (HCC) 1981   Hypercholesteremia    Hyperglycemia    Hypertension    Past Surgical History:  Procedure Laterality Date   CORONARY ARTERY BYPASS GRAFT N/A 08/09/2020   Procedure: CORONARY ARTERY BYPASS GRAFTING (CABG) X  FOUR   USING  LEFT INTERNAL MAMMARY ARTERY HARVEST AND RIGHT AND LEFT ENDOSCOPIC SAPHENOUS VEIN HARVEST;  Surgeon: Hilarie Lovely, MD;  Location: [facility] OR;  Service: Open Heart Surgery;  Laterality: N/A;   CORONARY PRESSURE/FFR STUDY N/A 08/02/2020   Procedure: INTRAVASCULAR PRESSURE WIRE/FFR STUDY;  Surgeon: Swaziland, [name], MD;  Location: [facility] INVASIVE CV LAB;  Service: Cardiovascular;  Laterality: N/A;   CORONARY STENT PLACEMENT  05/2013   mRCA   CORONARY/GRAFT ACUTE MI REVASCULARIZATION  2015   KNEE SURGERY Left 2009   LEFT HEART CATH AND CORONARY ANGIOGRAPHY N/A 08/02/2020   Procedure: LEFT HEART CATH AND CORONARY ANGIOGRAPHY;  Surgeon: Swaziland, [name], MD;  Location: [facility] INVASIVE CV LAB;  Service: Cardiovascular;  Laterality: N/A;   LEFT HEART CATHETERIZATION WITH CORONARY ANGIOGRAM N/A 06/02/2013   Procedure: LEFT HEART CATHETERIZATION WITH CORONARY ANGIOGRAM;  Surgeon: Lucendia Rusk, MD;  Location: [facility] CATH LAB;  Service: Cardiovascular;  Laterality: N/A;   PERCUTANEOUS CORONARY STENT INTERVENTION (PCI-S)  06/02/2013   Procedure: PERCUTANEOUS CORONARY STENT INTERVENTION (PCI-S);  Surgeon: Lucendia Rusk, MD;  Location: [facility] CATH LAB;  Service: Cardiovascular;;   TEE WITHOUT CARDIOVERSION N/A 08/09/2020   Procedure: TRANSESOPHAGEAL ECHOCARDIOGRAM (TEE);  Surgeon: Hilarie Lovely, MD;  Location: [facility] OR;  Service: Open Heart Surgery;   Laterality: N/A;   Family History  Problem Relation Age of Onset   Diabetes Mother    Heart failure Father    Heart attack Father    Bone cancer Father    Heart disease Brother        cabg   Heart attack Brother    CAD Other        Multiple family members with CABG   Colon cancer Neg Hx    Pancreatic cancer Neg Hx    Liver disease Neg Hx    Esophageal cancer Neg Hx    Social History   Socioeconomic History   Marital status: Widowed    Spouse name: Not on file   Number of children: 2   Years of education: 64   Highest education level: 11th grade  Occupational History    Comment: Disabled  Tobacco Use   Smoking status: Former    Current packs/day: 0.00    Types: Cigarettes    Quit date: 2007    Years since quitting: 18.3    Passive exposure: Past   Smokeless tobacco: Never  Vaping Use  Vaping status: Never Used  Substance and Sexual Activity   Alcohol use: No   Drug use: No   Sexual activity: Not Currently    Birth control/protection: None  Other Topics Concern   Not on file  Social History Narrative   Patient lives alone in. Patients wife died ~1 year ago.    Patient has support from his son and some friends.    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    Patient recently graduated cardiac rehab and has been going to [employer] 3x per week for ~ 2 hours.    Social Drivers of Corporate investment banker Strain: Low Risk  (09/23/2023)   Overall Financial Resource Strain (CARDIA)    Difficulty of Paying Living Expenses: Not hard at all  Food Insecurity: No Food Insecurity (09/23/2023)   Hunger Vital Sign    Worried About Running Out of Food in the Last Year: Never true    Ran Out of Food in the Last Year: Never true  Transportation Needs: No Transportation Needs (09/23/2023)   PRAPARE - Administrator, Civil Service (Medical): No    Lack of Transportation (Non-Medical): No  Physical Activity: Inactive (09/23/2023)   Exercise Vital Sign     Days of Exercise per Week: 0 days    Minutes of Exercise per Session: 0 min  Stress: No Stress Concern Present (09/23/2023)   [employer] of Occupational Health - Occupational Stress Questionnaire    Feeling of Stress : Only a little  Social Connections: Socially Isolated (09/23/2023)   Social Connection and Isolation Panel [NHANES]    Frequency of Communication with Friends and Family: More than three times a week    Frequency of Social Gatherings with Friends and Family: More than three times a week    Attends Religious Services: Never    Database administrator or Organizations: No    Attends Banker Meetings: Never    Marital Status: Widowed    Tobacco Counseling Counseling given: Not Answered    Clinical Intake:  Pre-visit preparation completed: Yes  Pain : No/denies pain Pain Score: 0-No pain     Nutritional Risks: None Diabetes: No  Lab Results  Component Value Date   HGBA1C 5.3 04/05/2021   HGBA1C 5.4 08/09/2020   HGBA1C 5.4 01/04/2017     How often do you need to have someone help you when you read instructions, pamphlets, or other written materials from your doctor or pharmacy?: 1 - Never  Interpreter Needed?: No  Information entered by :: Demorris Choyce N. Meryem Haertel, LPN.   Activities of Daily Living     09/23/2023   10:01 AM  In your present state of health, do you have any difficulty performing the following activities:  Hearing? 0  Vision? 0  Difficulty concentrating or making decisions? 0  Walking or climbing stairs? 0  Dressing or bathing? 0  Doing errands, shopping? 0  Preparing Food and eating ? N  Using the Toilet? N  In the past six months, have you accidently leaked urine? N  Do you have problems with loss of bowel control? N  Managing your Medications? N  Managing your Finances? N  Housekeeping or managing your Housekeeping? N    Patient Care Team: Goble Last, MD as PCP - General (Family Medicine) Sonny Dust, MD (Inactive) as PCP - Cardiology (Cardiology) Elmyra Haggard, MD as Consulting Physician (Cardiology) Ledon Pry as Consulting Physician  Indicate  any recent Medical Services you may have received from other than [facility] providers in the past year (date may be approximate).     Assessment:   This is a routine wellness examination for Lonnie Skinner.  Hearing/Vision screen Hearing Screening - Comments:: Denies hearing difficulties.  Vision Screening - Comments:: Wears rx glasses - up to date with routine eye exams with [facility]    Goals Addressed             This Visit's Progress    To stay independent and keep my cardiology appointments.         Depression Screen     09/23/2023   10:01 AM 03/28/2023   10:03 AM 03/26/2023    9:43 AM 05/04/2022   11:43 AM 12/04/2021    1:34 PM 11/03/2021    2:01 PM 10/30/2021   10:50 AM  PHQ 2/9 Scores  PHQ - 2 Score 0 2 2 1  0 1 0  PHQ- 9 Score 0 2 6 2  4  0  Exception Documentation    --       Fall Risk     09/23/2023   10:00 AM 08/16/2022   10:14 AM 11/03/2021    2:01 PM 07/13/2021    3:41 PM 04/17/2021    3:48 PM  Fall Risk   Falls in the past year? 0 0 0 1 0  Number falls in past yr: 0 0 0 0 0  Injury with Fall? 0 0 0 0 0  Risk for fall due to : No Fall Risks No Fall Risks  Impaired balance/gait   Follow up Falls prevention discussed;Falls evaluation completed Falls evaluation completed  Falls prevention discussed     MEDICARE RISK AT HOME:  Medicare Risk at Home Any stairs in or around the home?: No If so, are there any without handrails?: No Home free of loose throw rugs in walkways, pet beds, electrical cords, etc?: Yes Adequate lighting in your home to reduce risk of falls?: Yes Life alert?: No Use of a cane, walker or w/c?: No Grab bars in the bathroom?: No Shower chair or bench in shower?: No Elevated toilet seat or a handicapped toilet?: No  TIMED UP AND GO:  Was the test performed?  No  Cognitive  Function: 6CIT completed    09/23/2023   10:01 AM  MMSE - Mini Mental State Exam  Not completed: Unable to complete        09/23/2023   10:01 AM 08/16/2022   10:22 AM 07/13/2021    3:44 PM  6CIT Screen  What Year? 0 points 0 points 0 points  What month? 0 points 0 points 0 points  What time? 0 points 0 points 0 points  Count back from 20 0 points 0 points 0 points  Months in reverse 0 points 0 points 0 points  Repeat phrase 0 points 0 points 0 points  Total Score 0 points 0 points 0 points    Immunizations Immunization History  Administered Date(s) Administered   Influenza Whole 03/22/2009   Influenza,inj,Quad PF,6+ Mos 06/03/2013, 04/22/2019, 03/09/2020, 03/22/2021   Influenza-Unspecified 02/18/2017, 03/21/2018   PNEUMOCOCCAL CONJUGATE-20 04/05/2021   Pneumococcal Polysaccharide-23 09/28/2014   Tdap 11/19/2018    Screening Tests Health Maintenance  Topic Date Due   COVID-19 Vaccine (1) Never done   INFLUENZA VACCINE  12/20/2023   Medicare Annual Wellness (AWV)  09/22/2024   Colonoscopy  09/26/2024   DTaP/Tdap/Td (2 - Td or Tdap) 11/18/2028   Pneumococcal Vaccine 19-64 Years  old  Completed   Hepatitis C Screening  Completed   HIV Screening  Completed   Zoster Vaccines- Shingrix  Completed   HPV VACCINES  Aged Out   Meningococcal B Vaccine  Aged Out    Health Maintenance  Health Maintenance Due  Topic Date Due   COVID-19 Vaccine (1) Never done   Health Maintenance Items Addressed: Yes Patient is current with care gaps.  Additional Screening:  Vision Screening: Recommended annual ophthalmology exams for early detection of glaucoma and other disorders of the eye.  Dental Screening: Recommended annual dental exams for proper oral hygiene  Community Resource Referral / Chronic Care Management: CRR required this visit?  No   CCM required this visit?  No     Plan:     I have personally reviewed and noted the following in the patient's chart:   Medical  and social history Use of alcohol, tobacco or illicit drugs  Current medications and supplements including opioid prescriptions. Patient is not currently taking opioid prescriptions. Functional ability and status Nutritional status Physical activity Advanced directives List of other physicians Hospitalizations, surgeries, and ER visits in previous 12 months Vitals Screenings to include cognitive, depression, and falls Referrals and appointments  In addition, I have reviewed and discussed with patient certain preventive protocols, quality metrics, and best practice recommendations. A written personalized care plan for preventive services as well as general preventive health recommendations were provided to patient.     Margette Sheldon, LPN   0/01/8118   After Visit Summary: (MyChart) Due to this being a telephonic visit, the after visit summary with patients personalized plan was offered to patient via MyChart   Notes: Nothing significant to report at this time.

## 2023-09-24 ENCOUNTER — Ambulatory Visit: Admitting: Student

## 2023-10-02 ENCOUNTER — Ambulatory Visit: Admitting: Student

## 2023-10-15 ENCOUNTER — Ambulatory Visit: Admitting: Cardiology

## 2023-10-17 ENCOUNTER — Ambulatory Visit: Admitting: Family Medicine

## 2023-10-17 NOTE — Progress Notes (Signed)
 Cardiology Office Note:  .   Date:  10/29/2023  ID:  Lonnie Skinner, DOB 09/17/1960, MRN [id number] PCP: Lonnie Last, MD  [facility] Providers Cardiologist:  Sonny Dust, MD (Inactive)    History of Present Illness: .   Lonnie Skinner is a 63 y.o. male with a hx of CAD/NSTEMI s/p DES to [facility] with residual moderate LAD disease in 2015, chronic diastolic heart failure, HTN, HLD, CKD  Cath 08/02/20 showed critical 95% prox LAD, 70% OM1, 60% OM2, 70% prox RCA with abnormal FFR. He subsequently underwent CABG 07/2020 with LIMA-LAD, S-PDA, S-OM1, S-OM2 with Dr. Deloise Ferries. TTE 08/12/20 with LVEF 60-65%, no WMA.  Patient comes in for regular f/u. Denies chest pain, dyspnea, palpitations, edema. No regular exercise. No bleeding problems on plavix  and ASA. PCP stopped lasix  due to dizziness and low BP's.  No further trouble.     ROS:    Studies Reviewed: Lonnie Skinner    EKG Interpretation Date/Time:  Tuesday October 29 2023 12:16:36 EDT Ventricular Rate:  69 PR Interval:  148 QRS Duration:  90 QT Interval:  384 QTC Calculation: 411 R Axis:   14  Text Interpretation: Normal sinus rhythm Septal infarct , age undetermined When compared with ECG of 10-Aug-2020 06:42, Septal infarct is now Present ST no longer elevated in Lateral leads Nonspecific T wave abnormality now evident in Lateral leads Confirmed by Lonnie Skinner [phone]) on 10/29/2023 12:26:27 PM    Prior CV Studies:   Monitor 11/2021:    Predominant rhythm was 6 days and 12 hours   Predominant rhythm was NSR with average HR 72bpm   There were 11 runs of SVT with longest lasting 9 beats   Frequent VE (6.4%), rare SVE (<1%)   Patient triggered event correlates with VE   No sustained arrhythmias or significant pauses     Patch Wear Time:  6 days and 12 hours (2023-07-07T10:42:58-0400 to 2023-07-13T22:48:15-0400)   Patient had a min HR of 51 bpm, max HR of 129 bpm, and avg HR of 72 bpm. Predominant underlying rhythm was Sinus Rhythm.  11 Supraventricular Tachycardia runs occurred, the run with the fastest interval lasting 9 beats with a max rate of 129 bpm (avg 116  bpm); the run with the fastest interval was also the longest. Isolated SVEs were rare (<1.0%), SVE Couplets were rare (<1.0%), and SVE Triplets were rare (<1.0%). Isolated VEs were frequent (6.4%, [id number]), VE Couplets were rare (<1.0%, 2040), and VE  Triplets were rare (<1.0%, 2). Ventricular Bigeminy and Trigeminy were present.   Myocardial Perfusion Imaging 11/13/2021   The study is normal. The study is low risk.   No ST deviation was noted.   LV perfusion is normal. There is no evidence of ischemia. There is no evidence of infarction.   Left ventricular function is normal. Nuclear stress EF: 56 %. The left ventricular ejection fraction is normal (55-65%). End diastolic cavity size is normal. End systolic cavity size is normal.   Prior study not available for comparison.   Normal stress nuclear study with no ischemia or infarction.  Gated ejection fraction 56% with normal wall motion.   Carotid Ultrasound 10/2021: Summary:  Right Carotid: The extracranial vessels were near-normal with only minimal  wall                 thickening or plaque.   Left Carotid: The extracranial vessels were near-normal with only minimal  wall  thickening or plaque.   Vertebrals:  Bilateral vertebral arteries demonstrate antegrade flow.  Subclavians: Normal flow hemodynamics were seen in bilateral subclavian               arteries.    Lower Extremity Venous Reflux 09/20/20 Summary:  Right:  - No evidence of acute deep vein thrombosis seen in the right lower extremity, from the common femoral through the popliteal veins.  - Venous reflux is noted in the right common femoral vein.  - Venous reflux is noted in the right sapheno-femoral junction.  - Venous reflux is noted in the right greater saphenous vein in the proximal thigh.  - Venous reflux is noted in the  right short saphenous vein, fossa area.     Left:  - No evidence of acute deep vein thrombosis seen in the left lower extremity, from the common femoral through the popliteal veins.  - No evidence of superficial venous reflux seen in the left greater saphenous vein.  - No evidence of superficial venous reflux seen in the left short saphenous vein.  - Venous reflux is noted in the left common femoral vein.  - Venous reflux is noted in the left sapheno-femoral junction.    Echo TEE 08/09/20 PRE-OP FINDINGS   Left Ventricle: The left ventricle has normal systolic function, with an ejection fraction of 55-60%. The cavity size was normal. There is no increase in left ventricular wall thickness.   Right Ventricle: The right ventricle has normal systolic function. The cavity was normal. There is no increase in right ventricular wall thickness.   Left Atrium: Left atrial size was not assessed. No left atrial/left atrial appendage thrombus was detected.   Right Atrium: Right atrial size was not assessed.   Interatrial Septum: The interatrial septum was not assessed.   Pericardium: A small pericardial effusion is present. The pericardial effusion is localized near the right ventricle.   Mitral Valve: The mitral valve is normal in structure. Mitral valve regurgitation is trivial by color flow Doppler.   Tricuspid Valve: The tricuspid valve was normal in structure. Tricuspid valve regurgitation is trivial by color flow Doppler.   Aortic Valve: The aortic valve is bicuspid Aortic valve regurgitation was not visualized by color flow Doppler. There is no stenosis of the aortic valve.   Pulmonic Valve: The pulmonic valve was normal in structure. Pulmonic valve regurgitation is trivial by color flow Doppler.      Cath 08/02/20: Prox LAD lesion is 95% stenosed. Prox LAD to Mid LAD lesion is 80% stenosed. 1st Mrg lesion is 75% stenosed. 2nd Mrg lesion is 60% stenosed. Prox RCA to Mid RCA lesion is  70% stenosed. Previously placed Mid RCA-1 stent (unknown type) is widely patent. Mid RCA-2 lesion is 50% stenosed. The left ventricular systolic function is normal. LV end diastolic pressure is mildly elevated. The left ventricular ejection fraction is 55-65% by visual estimate.   1. 3 vessel obstructive CAD    - critical 95% proximal LAD    - 70% OM1    - 60% OM2    - 70% mid RCA proximal to prior stent with abnormal RFR 2. Normal LV function 3. Mildly elevated LVEDP   Plan: recommend CABG. Will hold Plavix . Admit to telemetry and treat with IV heparin . Check Echo.     TTE 08/02/20: IMPRESSIONS   1. Left ventricular ejection fraction, by estimation, is 60 to 65%. The left ventricle has normal function. The left ventricle has no regional wall motion abnormalities. Left  ventricular diastolic parameters were normal.   2. Right ventricular systolic function is normal. The right ventricular size is normal. Tricuspid regurgitation signal is inadequate for assessing PA pressure.   3. The mitral valve is normal in structure. No evidence of mitral valve regurgitation. No evidence of mitral stenosis.   4. The aortic valve is normal in structure. Aortic valve regurgitation is not visualized. No aortic stenosis is present.   5. The inferior vena cava is normal in size with greater than 50% respiratory variability, suggesting right atrial pressure of 3 mmHg.     Risk Assessment/Calculations:             Physical Exam:   VS:  BP 114/74   Pulse 71   Ht 5\' 8"  (1.727 m)   Wt 219 lb (99.3 kg)   SpO2 97%   BMI 33.30 kg/m    Wt Readings from Skinner 3 Encounters:  10/29/23 219 lb (99.3 kg)  10/23/23 217 lb 12.8 oz (98.8 kg)  04/10/23 203 lb 6.4 oz (92.3 kg)    GEN: Obese, in no acute distress NECK: No JVD; No carotid bruits CARDIAC:  RRR, no murmurs, rubs, gallops RESPIRATORY:  Clear to auscultation without rales, wheezing or rhonchi  ABDOMEN: Soft, non-tender, non-distended EXTREMITIES:   No edema; No deformity   ASSESSMENT AND PLAN: .    Severe multivessel CAD s/p CABG Patient with history of NSTEMI in in 2015 s/p DES to mid-RCA with known residual moderate LAD disease. Presented to clinic with exertional chest pain in 07/2020 found to have multivessel CAD s/p 4v CABG on 07/2020 with L-LAD, S-PDA, S-OM1, S-OM2. Currently, doing well without anginal symptoms. -Continue plavix  75mg  daily -Continue carvedilol  6.25 mg BID -Continue atorvastatin  80mg  daily -150 min exercise weekly   Chronic diastolic CHF: Stable and euvolemic on examination with NYHA class I symptoms -Continue carvedilol  6.25 mg BID -Continue spiro 12.5mg  daily -off lasix  due to CKD.-check labs today.     Frequent PVCs: Noted to have 6.4% burden on Zio monitor.=. -Keep on carvedilol  6.25   bid   Stay hydrated         Essential hypertension: BP is well controlled on current regimen     CKD: Check BMEt today    HLD: -Continue atorvastatin  80mg  as above -LDL 42 03/2023   Obesity -diet and exercise discussed        Dispo: f/u in 6 months  Signed, Lonnie Flake, PA-C

## 2023-10-21 ENCOUNTER — Ambulatory Visit: Admitting: Student

## 2023-10-23 ENCOUNTER — Encounter: Payer: Self-pay | Admitting: Student

## 2023-10-23 ENCOUNTER — Ambulatory Visit (INDEPENDENT_AMBULATORY_CARE_PROVIDER_SITE_OTHER): Admitting: Student

## 2023-10-23 VITALS — BP 118/70 | HR 84 | Ht 68.5 in | Wt 217.8 lb

## 2023-10-23 DIAGNOSIS — H00011 Hordeolum externum right upper eyelid: Secondary | ICD-10-CM | POA: Diagnosis not present

## 2023-10-23 NOTE — Patient Instructions (Signed)
 Stye A stye, also known as a hordeolum, is a bump that forms on an eyelid. It may look like a pimple next to the eyelash. A stye can form inside the eyelid (internal stye) or outside the eyelid (external stye). A stye can cause redness, swelling, and pain on the eyelid. Styes are very common. Anyone can get them at any age. They usually occur in just one eye at a time, but you may have more than one in either eye. What are the causes? A stye is caused by an infection. The infection is almost always caused by bacteria called Staphylococcus aureus. This is a common type of bacteria that lives on the skin. An internal stye may result from an infected oil-producing gland inside the eyelid. An external stye may be caused by an infection at the base of the eyelash (hair follicle). What increases the risk? You are more likely to develop a stye if: You have had a stye before. You have any of these conditions: Red, itchy, inflamed eyelids (blepharitis). A skin condition such as seborrheic dermatitis or rosacea. High fat levels in your blood (lipids). Dry eyes. What are the signs or symptoms? The most common symptom of a stye is eyelid pain. Internal styes are more painful than external styes. Other symptoms may include: Painful swelling of your eyelid. A scratchy feeling in your eye. Tearing and redness of your eye. A pimple-like bump on the edge of the eyelid. Pus draining from the stye. How is this diagnosed? Your health care provider may be able to diagnose a stye just by examining your eye. The health care provider may also check to make sure: You do not have a fever or other signs of a more serious infection. The infection has not spread to other parts of your eye or areas around your eye. How is this treated? Most styes will clear up in a few days without treatment or with warm compresses applied to the area. You may need to use antibiotic drops or ointment to treat an infection. Sometimes,  steroid drops or ointment are used in addition to antibiotics. In some cases, your health care provider may give you a small steroid injection in the eyelid. If your stye does not heal with routine treatment, your health care provider may drain pus from the stye using a thin blade or needle. This may be done if the stye is large, causing a lot of pain, or affecting your vision. Follow these instructions at home: Take over-the-counter and prescription medicines only as told by your health care provider. This includes eye drops or ointments. If you were prescribed an antibiotic medicine, steroid medicine, or both, apply or use them as told by your health care provider. Do not stop using the medicine even if your condition improves. Apply a warm, wet cloth (warm compress) to your eye for 5-10 minutes, 4 to 6 times a day. Clean the affected eyelid as directed by your health care provider. Do not wear contact lenses or eye makeup until your stye has healed and your health care provider says that it is safe. Do not try to pop or drain the stye. Do not rub your eye. Contact a health care provider if: You have chills or a fever. Your stye does not go away after several days. Your stye affects your vision. Your eyeball becomes swollen, red, or painful. Get help right away if: You have pain when moving your eye around. Summary A stye is a bump that forms  on an eyelid. It may look like a pimple next to the eyelash. A stye can form inside the eyelid (internal stye) or outside the eyelid (external stye). A stye can cause redness, swelling, and pain on the eyelid. Your health care provider may be able to diagnose a stye just by examining your eye. Apply a warm, wet cloth (warm compress) to your eye for 5-10 minutes, 4 to 6 times a day. This information is not intended to replace advice given to you by your health care provider. Make sure you discuss any questions you have with your health care  provider. Document Revised: 07/13/2020 Document Reviewed: 07/13/2020 Elsevier Patient Education  2024 [employer].

## 2023-10-23 NOTE — Progress Notes (Signed)
    SUBJECTIVE:   CHIEF COMPLAINT / HPI:   63 year old male presenting today for 1 week of left eye lesion and pinkeye.  No eye pain, increased tearing or vision changes.  He has been trying warm compresses in the past 2 days. No fever or eye trauma   PERTINENT  PMH / PSH: Reviewed   OBJECTIVE:   Pulse 84   Ht 5' 8.5" (1.74 m)   Wt 217 lb 12.8 oz (98.8 kg)   SpO2 99%   BMI 32.63 kg/m    Physical Exam General: Alert, well appearing, NAD HEENT: Papular lesion over the right upper eyelid Cardiovascular: Lower rate, well-perfused Respiratory: Normal work of breathing on RA Psych: Normal affect, good judgment  ASSESSMENT/PLAN:   Left eye lesion Patient's history and exam are consistent with eye stye.  Advised patient to continue warm water  compresses to avoid use of eye for the time being.  Return precaution discussed with patient and if no improvement in 4 weeks or increase in size will consider ophthalmology referral.   Goble Last, MD [facility]

## 2023-10-29 ENCOUNTER — Ambulatory Visit: Attending: Physician Assistant | Admitting: Physician Assistant

## 2023-10-29 ENCOUNTER — Encounter: Payer: Self-pay | Admitting: Physician Assistant

## 2023-10-29 VITALS — BP 114/74 | HR 71 | Ht 68.0 in | Wt 219.0 lb

## 2023-10-29 DIAGNOSIS — I5032 Chronic diastolic (congestive) heart failure: Secondary | ICD-10-CM | POA: Diagnosis not present

## 2023-10-29 DIAGNOSIS — R42 Dizziness and giddiness: Secondary | ICD-10-CM | POA: Diagnosis not present

## 2023-10-29 DIAGNOSIS — I10 Essential (primary) hypertension: Secondary | ICD-10-CM

## 2023-10-29 DIAGNOSIS — I2510 Atherosclerotic heart disease of native coronary artery without angina pectoris: Secondary | ICD-10-CM | POA: Diagnosis not present

## 2023-10-29 DIAGNOSIS — N182 Chronic kidney disease, stage 2 (mild): Secondary | ICD-10-CM | POA: Diagnosis not present

## 2023-10-29 DIAGNOSIS — I493 Ventricular premature depolarization: Secondary | ICD-10-CM | POA: Diagnosis not present

## 2023-10-29 DIAGNOSIS — E782 Mixed hyperlipidemia: Secondary | ICD-10-CM

## 2023-10-29 DIAGNOSIS — E66811 Obesity, class 1: Secondary | ICD-10-CM

## 2023-10-29 DIAGNOSIS — R55 Syncope and collapse: Secondary | ICD-10-CM

## 2023-10-29 DIAGNOSIS — E785 Hyperlipidemia, unspecified: Secondary | ICD-10-CM | POA: Diagnosis not present

## 2023-10-29 MED ORDER — CARVEDILOL 6.25 MG PO TABS: 6.2500 mg | ORAL_TABLET | Freq: Two times a day (BID) | ORAL | 3 refills | Status: AC

## 2023-10-29 MED ORDER — CLOPIDOGREL BISULFATE 75 MG PO TABS: 75.0000 mg | ORAL_TABLET | Freq: Every day | ORAL | 3 refills | Status: AC

## 2023-10-29 MED ORDER — ATORVASTATIN CALCIUM 80 MG PO TABS: 80.0000 mg | ORAL_TABLET | Freq: Every evening | ORAL | 3 refills | Status: DC

## 2023-10-29 MED ORDER — NITROGLYCERIN 0.4 MG SL SUBL: 0.4000 mg | SUBLINGUAL_TABLET | SUBLINGUAL | 5 refills | Status: AC | PRN

## 2023-10-29 MED ORDER — SPIRONOLACTONE 25 MG PO TABS: 12.5000 mg | ORAL_TABLET | Freq: Every day | ORAL | 3 refills | Status: AC

## 2023-10-29 NOTE — Addendum Note (Signed)
 Addended by: Azzie Thiem on: 10/29/2023 12:54 PM   Modules accepted: Orders

## 2023-10-29 NOTE — Patient Instructions (Signed)
 Medication Instructions:  Your physician recommends that you continue on your current medications as directed. Please refer to the Current Medication list given to you today.  *If you need a refill on your cardiac medications before your next appointment, please call your pharmacy*  Lab Work: TODAY: CBC, CMET If you have labs (blood work) drawn today and your tests are completely normal, you will receive your results only by: MyChart Message (if you have MyChart) OR A paper copy in the mail If you have any lab test that is abnormal or we need to change your treatment, we will call you to review the results.  Testing/Procedures: NONE  Follow-Up: At [facility], you and your health needs are our priority.  As part of our continuing mission to provide you with exceptional heart care, our providers are all part of one team.  This team includes your primary Cardiologist (physician) and Advanced Practice Providers or APPs (Physician Assistants and Nurse Practitioners) who all work together to provide you with the care you need, when you need it.  Your next appointment:   6 month(s)  Provider:   DR. Avanell Bob  We recommend signing up for the patient portal called "MyChart".  Sign up information is provided on this After Visit Summary.  MyChart is used to connect with patients for Virtual Visits (Telemedicine).  Patients are able to view lab/test results, encounter notes, upcoming appointments, etc.  Non-urgent messages can be sent to your provider as well.   To learn more about what you can do with MyChart, go to [email or web address].   Other Instructions YOUR PROVIDER RECOMMENDS THAT YOU DO 150 MINUTES OF EXERCISE WEEKLY.

## 2023-10-30 ENCOUNTER — Ambulatory Visit: Payer: Self-pay | Admitting: Physician Assistant

## 2023-10-30 LAB — COMPREHENSIVE METABOLIC PANEL WITH GFR
ALT: 13 IU/L (ref 0–44)
AST: 16 IU/L (ref 0–40)
Albumin: 4 g/dL (ref 3.9–4.9)
Alkaline Phosphatase: 87 IU/L (ref 44–121)
BUN/Creatinine Ratio: 13 (ref 10–24)
BUN: 17 mg/dL (ref 8–27)
Bilirubin Total: 0.4 mg/dL (ref 0.0–1.2)
CO2: 23 mmol/L (ref 20–29)
Calcium: 9.7 mg/dL (ref 8.6–10.2)
Chloride: 102 mmol/L (ref 96–106)
Creatinine, Ser: 1.34 mg/dL — ABNORMAL HIGH (ref 0.76–1.27)
Globulin, Total: 2.4 g/dL (ref 1.5–4.5)
Glucose: 95 mg/dL (ref 70–99)
Potassium: 4.9 mmol/L (ref 3.5–5.2)
Sodium: 139 mmol/L (ref 134–144)
Total Protein: 6.4 g/dL (ref 6.0–8.5)
eGFR: 60 mL/min/{1.73_m2} (ref >59)

## 2023-10-30 LAB — CBC
Hematocrit: 44.6 % (ref 37.5–51.0)
Hemoglobin: 14.9 g/dL (ref 13.0–17.7)
MCH: 31.2 pg (ref 26.6–33.0)
MCHC: 33.4 g/dL (ref 31.5–35.7)
MCV: 94 fL (ref 79–97)
Platelets: 229 10*3/uL (ref 150–450)
RBC: 4.77 x10E6/uL (ref 4.14–5.80)
RDW: 12.6 % (ref 11.6–15.4)
WBC: 7 10*3/uL (ref 3.4–10.8)

## 2023-11-11 ENCOUNTER — Ambulatory Visit (INDEPENDENT_AMBULATORY_CARE_PROVIDER_SITE_OTHER): Admitting: Student

## 2023-11-11 ENCOUNTER — Encounter: Payer: Self-pay | Admitting: Student

## 2023-11-11 VITALS — BP 120/80 | HR 79 | Temp 98.1°F | Ht 68.0 in | Wt 216.8 lb

## 2023-11-11 DIAGNOSIS — J069 Acute upper respiratory infection, unspecified: Secondary | ICD-10-CM

## 2023-11-11 MED ORDER — GUAIFENESIN ER 600 MG PO TB12: 600.0000 mg | ORAL_TABLET | Freq: Two times a day (BID) | ORAL | 0 refills | Status: AC

## 2023-11-11 MED ORDER — FLUTICASONE PROPIONATE 50 MCG/ACT NA SUSP: 2.0000 | Freq: Every day | NASAL | 6 refills | Status: AC

## 2023-11-11 NOTE — Progress Notes (Signed)
    SUBJECTIVE:   CHIEF COMPLAINT / HPI:   Lonnie Skinner is a 63 year old male who presents with upper respiratory symptoms including cough, congestion, and sore throat.  Symptoms began two days ago, worsening the following day and persisting today. He experiences nasal congestion, sneezing, cough, sore throat, and general malaise. He takes Tylenol  650 mg every eight hours for headaches and pain, with two doses last night and two this morning. No fever is present, confirmed by temperature check. He uses Vicks cough drops for throat discomfort and consumes chicken noodle soup despite decreased appetite. No body aches are present. He is concerned about possibly having COVID-19, although no fever is noted.  PERTINENT  PMH / PSH: Reviewed   OBJECTIVE:   BP 120/80   Pulse 79   Temp 98.1 F (36.7 C)   Ht 5' 8 (1.727 m)   Wt 216 lb 12.8 oz (98.3 kg)   SpO2 96%   BMI 32.96 kg/m    Physical Exam General: Alert, well appearing, NAD HEENT: No oropharyngeal erythema, tonsillar exudates or cervical lymphadenopathy Cardiovascular: RRR, No Murmurs, Normal S2/S2 Respiratory: CTAB, No wheezing or Rales Abdomen: No distension or tenderness   ASSESSMENT/PLAN:   Upper Respiratory Infection (URI) Symptoms suggest viral URI, likely common cold. Afebrile today and reassuring O2 sats on room air with clear breath sounds bilaterally.  No concerns for pneumonia at this time. - Prescribed Flonase nasal spray for congestion. - Prescribed Mucinex for cough and mucus. - Instructed to take Tylenol  every eight hours for two days, then as needed. - Advised warm water  with honey and lemon or tea with honey for throat comfort.   Norleen April, MD [facility]

## 2023-11-11 NOTE — Patient Instructions (Signed)
 Pleasure to meet you today.  Suspect your symptoms are most likely due to ongoing viral upper respiratory illness and gastroenteritis.  I recommend making sure you stay hydrated with enough water .  Also you can do Tylenol  every 8hrs for 2 days for any aches and pain with the cough.  I encourage use of warm water , honey and lemon to help with the cough.  Have sent in prescription for Mucinex which you should take twice daily for at least 5-7 days.  Use the flonase spray to the nose to help with the runny nose.

## 2023-12-06 ENCOUNTER — Ambulatory Visit (INDEPENDENT_AMBULATORY_CARE_PROVIDER_SITE_OTHER): Admitting: Student

## 2023-12-06 ENCOUNTER — Encounter: Payer: Self-pay | Admitting: Student

## 2023-12-06 VITALS — BP 155/98 | HR 72 | Wt 211.4 lb

## 2023-12-06 DIAGNOSIS — R413 Other amnesia: Secondary | ICD-10-CM | POA: Diagnosis not present

## 2023-12-06 NOTE — Patient Instructions (Addendum)
 Pleasure to see you today.  Your memory test today was normal.  Suspect your memory deficits could be due to difficulty with concentration.  We recommend making sure you are getting adequate sleep, eating adequately and staying active.  Playing puzzle games have also been shown to improve memory.   Management of Memory Problems  There are some general things you can do to help manage your memory problems.  Your memory may not in fact recover, but by using techniques and strategies you will be able to manage your memory difficulties better.  1)  Establish a routine. Try to establish and then stick to a regular routine.  By doing this, you will get used to what to expect and you will reduce the need to rely on your memory.  Also, try to do things at the same time of day, such as taking your medication or checking your calendar first thing in the morning. Think about think that you can do as a part of a regular routine and make a list.  Then enter them into a daily planner to remind you.  This will help you establish a routine.  2)  Organize your environment. Organize your environment so that it is uncluttered.  Decrease visual stimulation.  Place everyday items such as keys or cell phone in the same place every day (ie.  Basket next to front door) Use post it notes with a brief message to yourself (ie. Turn off light, lock the door) Use labels to indicate where things go (ie. Which cupboards are for food, dishes, etc.) Keep a notepad and pen by the telephone to take messages  3)  Memory Aids A diary or journal/notebook/daily planner Making a list (shopping list, chore list, to do list that needs to be done) Using an alarm as a reminder (kitchen timer or cell phone alarm) Using cell phone to store information (Notes, Calendar, Reminders) Calendar/White board placed in a prominent position Post-it notes  In order for memory aids to be useful, you need to have good habits.  It's no good  remembering to make a note in your journal if you don't remember to look in it.  Try setting aside a certain time of day to look in journal.  4)  Improving mood and managing fatigue. There may be other factors that contribute to memory difficulties.  Factors, such as anxiety, depression and tiredness can affect memory. Regular gentle exercise can help improve your mood and give you more energy. Simple relaxation techniques may help relieve symptoms of anxiety Try to get back to completing activities or hobbies you enjoyed doing in the past. Learn to pace yourself through activities to decrease fatigue. Find out about some local support groups where you can share experiences with others. Try and achieve 7-8 hours of sleep at night.

## 2023-12-06 NOTE — Progress Notes (Signed)
    SUBJECTIVE:   CHIEF COMPLAINT / HPI:   Lonnie Skinner is a 63 year old male who presents with memory concerns.  He experiences difficulty recalling his children's birthdays and ages. Reports needing to think about kids age and DOB but able to remember.  Has no issues recognizing familiar people or navigating familiar places. He is unable to take over-the-counter memory supplements due to his use of blood thinners per pharmacist in [facility]. Denies being lost in familiar areas and no issues completing daily routine  He maintains generally good sleep but sometimes wakes multiple times at night to urinate. His sleep schedule is inconsistent, with bedtime before 11 PM and waking as early as 4 or 5 AM.   PERTINENT  PMH / PSH: Reviewed  OBJECTIVE:   BP (!) 155/98   Pulse 72   Wt 211 lb 6.4 oz (95.9 kg)   SpO2 96%   BMI 32.14 kg/m    Physical Exam General: Alert, well appearing, NAD Cardiovascular: Regular rate, well-perfused Respiratory: Normal work of breathing on RA Neuro: ANO x 4, no focal neurodeficits.  Discussions patient does have scattered thought process and poor concentration. Mini cog test: Completed clock with time at 1245.  Able to recall 3 words (pen, watch, house)  ASSESSMENT/PLAN:   Memory Concerns Memory issues likely due to mental fatigue and lack of concentration. No dementia indicated. Advised against ginkgo biloba due to bleeding risk with anticoagulant therapy.  Mini cog test was normal as patient was able to recall 3 words (pain, watch, house) and able to complete clock with handles at 12:50.  - Establish regular sleep routine with goal 7-8hrs. - Engage in exercise and puzzles to enhance concentration. - Educated on importance of concentration and mental focus for memory retention. - Encouraged application of simple relaxation techniques - Advised  completing activities and engaging in hobbies  Norleen April, MD [facility]

## 2023-12-25 DIAGNOSIS — Z6831 Body mass index (BMI) 31.0-31.9, adult: Secondary | ICD-10-CM | POA: Diagnosis not present

## 2023-12-25 DIAGNOSIS — M170 Bilateral primary osteoarthritis of knee: Secondary | ICD-10-CM | POA: Diagnosis not present

## 2023-12-25 DIAGNOSIS — Z008 Encounter for other general examination: Secondary | ICD-10-CM | POA: Diagnosis not present

## 2023-12-25 DIAGNOSIS — E669 Obesity, unspecified: Secondary | ICD-10-CM | POA: Diagnosis not present

## 2023-12-25 DIAGNOSIS — I10 Essential (primary) hypertension: Secondary | ICD-10-CM | POA: Diagnosis not present

## 2023-12-25 DIAGNOSIS — Z860100 Personal history of colon polyps, unspecified: Secondary | ICD-10-CM | POA: Diagnosis not present

## 2024-01-22 ENCOUNTER — Telehealth: Payer: Self-pay | Admitting: Physician Assistant

## 2024-01-22 NOTE — Telephone Encounter (Signed)
 Patient calling for lab results, states he had them done in June and never heard anything.

## 2024-01-22 NOTE — Telephone Encounter (Signed)
 Returned call to pt, he has been made aware that we sent his results to mychart and that they have been read.  Pt said must have been his daughter.SABRA

## 2024-02-26 NOTE — Progress Notes (Signed)
 Lonnie Skinner                                          MRN: [id number]   02/26/2024   The [facility] Quality Team Specialist reviewed this patient medical record for the purposes of chart review for care gap closure. The following were reviewed: chart review for care gap closure-controlling blood pressure.    [facility] Quality Team

## 2024-04-29 NOTE — Progress Notes (Signed)
 Cardiology Office Note:    Date:  04/30/2024   ID:  Lonnie Skinner, DOB 1961-02-19, MRN [id number]  PCP:  Lonnie Matas, MD   [facility]  Cardiologist: Okey   Referring MD: Lonnie Norleen BROCKS, MD    History of Present Illness:    Lonnie Skinner is a 63 y.o. male with a hx of CAD, Hx of HFpEF, HTN, HLD, CKD  2015:  NSTEMI   LHC with PTCA/DES to [facility]; residual moderate LAD disease (if fails on medical therapy would proceed with PCI). He has typically not followed CV risk factor modification.   March 2022  CP    LHC 08/02/20 showed critical 95% prox LAD, 70% OM1, 60% OM2, 70% prox RCA with abnormal FFR. He subsequently underwent CABG 07/2020 with LIMA-LAD, S-PDA, S-OM1, S-OM2 with Dr. Shyrl.  March 2022  TTE showed LVEF 60-65%, no WMA.   June 2023   Myoview  was normal  JUne 2023  Dizzy  Carotid USN without signiicant dz   MRI brain without acute changes   I saw the pt in 2024   He was seen by Lonnie Skinner in JUne 2025   Since seen, the pt denies CP  Breathing is OK   No palpitations   NO dizziness  NO edema   Past Medical History:  Diagnosis Date   CAD (coronary artery disease)    a. 05/2013: NSTEMI s/p DES to mRCA (culprit vessel), residual moderate LAD disease (the patient fails medical therapy and this was found to be significant, this would be amenable to PCI).   CHF (congestive heart failure) (HCC)    CKD (chronic kidney disease), stage II    DVT (deep venous thrombosis) (HCC) 1981   Hypercholesteremia    Hyperglycemia    Hypertension     Past Surgical History:  Procedure Laterality Date   CORONARY ARTERY BYPASS GRAFT N/A 08/09/2020   Procedure: CORONARY ARTERY BYPASS GRAFTING (CABG) X  FOUR   USING  LEFT INTERNAL MAMMARY ARTERY HARVEST AND RIGHT AND LEFT ENDOSCOPIC SAPHENOUS VEIN HARVEST;  Surgeon: Shyrl Linnie KIDD, MD;  Location: [facility] OR;  Service: Open Heart Surgery;  Laterality: N/A;   CORONARY PRESSURE/FFR STUDY N/A 08/02/2020   Procedure:  INTRAVASCULAR PRESSURE WIRE/FFR STUDY;  Surgeon: Jordan, [name], MD;  Location: [facility] INVASIVE CV LAB;  Service: Cardiovascular;  Laterality: N/A;   CORONARY STENT PLACEMENT  05/2013   mRCA   CORONARY/GRAFT ACUTE MI REVASCULARIZATION  2015   KNEE SURGERY Left 2009   LEFT HEART CATH AND CORONARY ANGIOGRAPHY N/A 08/02/2020   Procedure: LEFT HEART CATH AND CORONARY ANGIOGRAPHY;  Surgeon: Jordan, [name], MD;  Location: [facility] INVASIVE CV LAB;  Service: Cardiovascular;  Laterality: N/A;   LEFT HEART CATHETERIZATION WITH CORONARY ANGIOGRAM N/A 06/02/2013   Procedure: LEFT HEART CATHETERIZATION WITH CORONARY ANGIOGRAM;  Surgeon: Candyce GORMAN Reek, MD;  Location: [facility] CATH LAB;  Service: Cardiovascular;  Laterality: N/A;   PERCUTANEOUS CORONARY STENT INTERVENTION (PCI-S)  06/02/2013   Procedure: PERCUTANEOUS CORONARY STENT INTERVENTION (PCI-S);  Surgeon: Candyce GORMAN Reek, MD;  Location: [facility] CATH LAB;  Service: Cardiovascular;;   TEE WITHOUT CARDIOVERSION N/A 08/09/2020   Procedure: TRANSESOPHAGEAL ECHOCARDIOGRAM (TEE);  Surgeon: Shyrl Linnie KIDD, MD;  Location: [facility] OR;  Service: Open Heart Surgery;  Laterality: N/A;    Current Medications: Current Meds  Medication Sig   acetaminophen  (TYLENOL  8 HOUR ARTHRITIS PAIN) 650 MG CR tablet Take 1 tablet (650 mg total) by mouth every 8 (eight) hours as  needed for pain.   ALPRAZolam  (XANAX ) 0.25 MG tablet TAKE 1 TABLET(0.25 MG) BY MOUTH TWICE DAILY AS NEEDED FOR ANXIETY   aspirin  81 MG EC tablet Take 1 tablet (81 mg total) by mouth daily.   atorvastatin  (LIPITOR ) 80 MG tablet Take 1 tablet (80 mg total) by mouth every evening.   carvedilol  (COREG ) 6.25 MG tablet Take 1 tablet (6.25 mg total) by mouth 2 (two) times daily with a meal.   clopidogrel  (PLAVIX ) 75 MG tablet Take 1 tablet (75 mg total) by mouth daily.   diclofenac Sodium (VOLTAREN) 1 % GEL Apply topically.   fluticasone  (FLONASE ) 50 MCG/ACT nasal spray Place 2 sprays into both nostrils daily.    lidocaine  (LIDODERM ) 5 % Place onto the skin.   nitroGLYCERIN  (NITROSTAT ) 0.4 MG SL tablet Place 1 tablet (0.4 mg total) under the tongue every 5 (five) minutes as needed for chest pain (up to 3 doses).   spironolactone  (ALDACTONE ) 25 MG tablet Take 0.5 tablets (12.5 mg total) by mouth daily.   traMADol  (ULTRAM ) 50 MG tablet Take 1 tablet (50 mg total) by mouth every 4 (four) hours as needed for moderate pain.     Allergies:   Morphine  and Penicillins   Social History   Socioeconomic History   Marital status: Widowed    Spouse name: Not on file   Number of children: 2   Years of education: 63   Highest education level: 11th grade  Occupational History    Comment: Disabled  Tobacco Use   Smoking status: Former    Current packs/day: 0.00    Types: Cigarettes    Quit date: 2007    Years since quitting: 18.9    Passive exposure: Past   Smokeless tobacco: Never  Vaping Use   Vaping status: Never Used  Substance and Sexual Activity   Alcohol use: No   Drug use: No   Sexual activity: Not Currently    Birth control/protection: None  Other Topics Concern   Not on file  Social History Narrative   Patient lives alone in. Patients wife died ~1 year ago.    Patient has support from his son and some friends.    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    Patient recently graduated cardiac rehab and has been going to [employer] 3x per week for ~ 2 hours.    Social Drivers of Health   Tobacco Use: Medium Risk (04/30/2024)   Patient History    Smoking Tobacco Use: Former    Smokeless Tobacco Use: Never    Passive Exposure: Past  Physicist, Medical Strain: Low Risk (09/23/2023)   Overall Financial Resource Strain (CARDIA)    Difficulty of Paying Living Expenses: Not hard at all  Food Insecurity: No Food Insecurity (09/23/2023)   Hunger Vital Sign    Worried About Running Out of Food in the Last Year: Never true    Ran Out of Food in the Last Year: Never true   Transportation Needs: No Transportation Needs (09/23/2023)   PRAPARE - Administrator, Civil Service (Medical): No    Lack of Transportation (Non-Medical): No  Physical Activity: Inactive (09/23/2023)   Exercise Vital Sign    Days of Exercise per Week: 0 days    Minutes of Exercise per Session: 0 min  Stress: No Stress Concern Present (09/23/2023)   [employer] of Occupational Health - Occupational Stress Questionnaire    Feeling of Stress : Only a little  Social Connections: Socially Isolated (09/23/2023)   Social Connection and Isolation Panel    Frequency of Communication with Friends and Family: More than three times a week    Frequency of Social Gatherings with Friends and Family: More than three times a week    Attends Religious Services: Never    Database Administrator or Organizations: No    Attends Banker Meetings: Never    Marital Status: Widowed  Depression (PHQ2-9): Low Risk (12/06/2023)   Depression (PHQ2-9)    PHQ-2 Score: 0  Alcohol Screen: Low Risk (09/23/2023)   Alcohol Screen    Last Alcohol Screening Score (AUDIT): 0  Housing: Low Risk (09/23/2023)   Housing Stability Vital Sign    Unable to Pay for Housing in the Last Year: No    Number of Times Moved in the Last Year: 0    Homeless in the Last Year: No  Utilities: Not At Risk (09/23/2023)   AHC Utilities    Threatened with loss of utilities: No  Health Literacy: Patient Declined (09/23/2023)   B1300 Health Literacy    Frequency of need for help with medical instructions: Patient declines to respond     Family History: The patient's family history includes Bone cancer in his father; CAD in an other family member; Diabetes in his mother; Heart attack in his brother and father; Heart disease in his brother; Heart failure in his father. There is no history of Colon cancer, Pancreatic cancer, Liver disease, or Esophageal cancer.  ROS:   Please see the history of present illness.    All  systems reviewed  Neg to above problme   EKGs/Labs/Other Studies Reviewed:    The following studies were reviewed today: Monitor 11/2021:    Predominant rhythm was 6 days and 12 hours   Predominant rhythm was NSR with average HR 72bpm   There were 11 runs of SVT with longest lasting 9 beats   Frequent VE (6.4%), rare SVE (<1%)   Patient triggered event correlates with VE   No sustained arrhythmias or significant pauses   Myocardial Perfusion Imaging 11/13/2021   The study is normal. The study is low risk.   No ST deviation was noted.   LV perfusion is normal. There is no evidence of ischemia. There is no evidence of infarction.   Left ventricular function is normal. Nuclear stress EF: 56 %. The left ventricular ejection fraction is normal (55-65%). End diastolic cavity size is normal. End systolic cavity size is normal.   Prior study not available for comparison.   Normal stress nuclear study with no ischemia or infarction.  Gated ejection fraction 56% with normal wall motion.  Carotid Ultrasound 10/2021: Summary:  Right Carotid: The extracranial vessels were near-normal with only minimal  wall                 thickening or plaque.   Left Carotid: The extracranial vessels were near-normal with only minimal  wall                thickening or plaque.   Vertebrals:  Bilateral vertebral arteries demonstrate antegrade flow.  Subclavians: Normal flow hemodynamics were seen in bilateral subclavian               arteries.   Lower Extremity Venous Reflux 09/20/20 Summary:  Right:  - No evidence of acute deep vein thrombosis seen in the right lower extremity, from the common femoral through the popliteal veins.  - Venous reflux is  noted in the right common femoral vein.  - Venous reflux is noted in the right sapheno-femoral junction.  - Venous reflux is noted in the right greater saphenous vein in the proximal thigh.  - Venous reflux is noted in the right short saphenous vein, fossa  area.     Left:  - No evidence of acute deep vein thrombosis seen in the left lower extremity, from the common femoral through the popliteal veins.  - No evidence of superficial venous reflux seen in the left greater saphenous vein.  - No evidence of superficial venous reflux seen in the left short saphenous vein.  - Venous reflux is noted in the left common femoral vein.  - Venous reflux is noted in the left sapheno-femoral junction.   Echo TEE 08/09/20 PRE-OP FINDINGS   Left Ventricle: The left ventricle has normal systolic function, with an ejection fraction of 55-60%. The cavity size was normal. There is no increase in left ventricular wall thickness.   Right Ventricle: The right ventricle has normal systolic function. The cavity was normal. There is no increase in right ventricular wall thickness.   Left Atrium: Left atrial size was not assessed. No left atrial/left atrial appendage thrombus was detected.   Right Atrium: Right atrial size was not assessed.   Interatrial Septum: The interatrial septum was not assessed.   Pericardium: A small pericardial effusion is present. The pericardial effusion is localized near the right ventricle.   Mitral Valve: The mitral valve is normal in structure. Mitral valve regurgitation is trivial by color flow Doppler.   Tricuspid Valve: The tricuspid valve was normal in structure. Tricuspid valve regurgitation is trivial by color flow Doppler.   Aortic Valve: The aortic valve is bicuspid Aortic valve regurgitation was not visualized by color flow Doppler. There is no stenosis of the aortic valve.   Pulmonic Valve: The pulmonic valve was normal in structure. Pulmonic valve regurgitation is trivial by color flow Doppler.    Cath 08/02/20: Prox LAD lesion is 95% stenosed. Prox LAD to Mid LAD lesion is 80% stenosed. 1st Mrg lesion is 75% stenosed. 2nd Mrg lesion is 60% stenosed. Prox RCA to Mid RCA lesion is 70% stenosed. Previously placed Mid  RCA-1 stent (unknown type) is widely patent. Mid RCA-2 lesion is 50% stenosed. The left ventricular systolic function is normal. LV end diastolic pressure is mildly elevated. The left ventricular ejection fraction is 55-65% by visual estimate.   1. 3 vessel obstructive CAD    - critical 95% proximal LAD    - 70% OM1    - 60% OM2    - 70% mid RCA proximal to prior stent with abnormal RFR 2. Normal LV function 3. Mildly elevated LVEDP   Plan: recommend CABG. Will hold Plavix . Admit to telemetry and treat with IV heparin . Check Echo.   TTE 08/02/20: IMPRESSIONS   1. Left ventricular ejection fraction, by estimation, is 60 to 65%. The left ventricle has normal function. The left ventricle has no regional wall motion abnormalities. Left ventricular diastolic parameters were normal.   2. Right ventricular systolic function is normal. The right ventricular size is normal. Tricuspid regurgitation signal is inadequate for assessing PA pressure.   3. The mitral valve is normal in structure. No evidence of mitral valve regurgitation. No evidence of mitral stenosis.   4. The aortic valve is normal in structure. Aortic valve regurgitation is not visualized. No aortic stenosis is present.   5. The inferior vena cava is normal in size  with greater than 50% respiratory variability, suggesting right atrial pressure of 3 mmHg.   ANGIOGRAPHIC DATA: The left main coronary artery is absent. There appear to be separate ostia of the LAD and circumflex.  The left anterior descending artery is a large vessel which reaches the apex. In the mid vessel, there is a 50-70% lesion in the mid LAD. There is mild to moderate diffuse disease. There 2 large diagonals which are widely patent. The apical LAD is small but patent.  The left circumflex artery is a medium size vessel. There appears to be some vasospasm proximally. There is a large first marginal which is patent. The second marginal is a larger vessel which branches  across the lateral wall. This appears widely patent.  The right coronary artery is a large dominant vessel. In the mid vessel, there is a focal 99% stenosis with visible thrombus. The posterior lateral artery is very large and widely patent. The posterior descending artery is widely patent.  LEFT VENTRICULOGRAM: Left ventricular angiogram was not done. LVEDP was 13 mmHg.  PCI NARRATIVE: A JR 4 guiding catheters u used to engage the RCA. Angiomax  was used for anticoagulation. Initially, a pro-water  wire was advanced but would not cross the lesion. A Fielder XT density cross the lesion in the mid right coronary artery. A 2.5 x 12 balloon was used to predilate the lesion. A 2.75 x 16 promise drug-eluting stent was then deployed. The stent was post dilated with a 3.25 x 12 noncompliant balloon. There is an excellent angiographic result. There is no residual stenosis. There were several areas of vasospasm during intervention noted in the distal right and posterior lateral artery. Both resolved with intracoronary nitroglycerin .  EKG:  ECG not ordered   Recent Labs: 10/29/2023: ALT 13; BUN 17; Creatinine, Ser 1.34; Hemoglobin 14.9; Platelets 229; Potassium 4.9; Sodium 139   Recent Lipid Panel    Component Value Date/Time   CHOL 115 04/04/2023 1407   TRIG 124 04/04/2023 1407   HDL 51 04/04/2023 1407   CHOLHDL 2.3 04/04/2023 1407   CHOLHDL 3.1 02/13/2016 1540   VLDL 21 02/13/2016 1540   LDLCALC 42 04/04/2023 1407     Risk Assessment/Calculations:       Physical Exam:    VS:  BP 120/78 (BP Location: Left Arm, Patient Position: Sitting, Cuff Size: Large)   Pulse 73   Ht 5' 8 (1.727 m)   Wt 209 lb (94.8 kg)   SpO2 99%   BMI 31.78 kg/m     Wt Readings from Last 3 Encounters:  04/30/24 209 lb (94.8 kg)  12/06/23 211 lb 6.4 oz (95.9 kg)  11/11/23 216 lb 12.8 oz (98.3 kg)     GEN:  Obese 63 yo in NAD  HEENT: Normal NECK: No JVD; No carotid bruit CARDIAC: RRR  No S3  No murmurs    RESPIRATORY:  Clear to auscultation ABDOMEN: Soft, non-tender  No hepatomegaly    Ext  No LE edema      PLAN:    In order of problems listed above:  #Severe multivessel CAD s/p CABG Patient with history of NSTEMI in in 2015 s/p DES to mid-RCA with known residual moderate LAD disease. Presented to clinic with exertional chest pain in 07/2020 found to have multivessel CAD s/p 4v CABG on 07/2020 with L-LAD, S-PDA, S-OM1, S-OM2. Currently, doing well without anginal symptoms. -Continue plavix  75mg  daily -Continue carvedilol  9.375mg  BID -Continue atorvastatin  80mg  daily  #Hx of HFpEF: Volume status is  good  -  Continue carvedilol  9.375mg  BID -Continue spiro 12.5mg  daily   #PVCs: Noted to have 6.4% burden on Zio monitor.=. -Continue carvedilol  6.25   bid   Stay hydrated      #Dizziness/Near Syncope: Denies   #Essential hypertension: BP is well controlled on current regimen    #CKD: Plan for BMET today   #HLD: -Continue atorvastatin  80mg  as above -Will check NMR panel   Check TSH, A1C and CBC as well as other labs    Follow Up: 6 months  Medication Adjustments/Labs and Tests Ordered: Current medicines are reviewed at length with the patient today.  Concerns regarding medicines are outlined above.  Orders Placed This Encounter  Procedures   NMR, lipoprofile   TSH   HgB A1c   Basic Metabolic Panel (BMET)   CBC   No orders of the defined types were placed in this encounter.  Patient Instructions  Medication Instructions:  Your physician recommends that you continue on your current medications as directed. Please refer to the Current Medication list given to you today.  *If you need a refill on your cardiac medications before your next appointment, please call your pharmacy*  Lab Work: NMR Lipoprofile, TSH, A1C, BMET, CBC If you have labs (blood work) drawn today and your tests are completely normal, you will receive your results only by: MyChart Message (if you  have MyChart) OR A paper copy in the mail If you have any lab test that is abnormal or we need to change your treatment, we will call you to review the results.  Testing/Procedures: None ordered  Follow-Up: At [facility], you and your health needs are our priority.  As part of our continuing mission to provide you with exceptional heart care, our providers are all part of one team.  This team includes your primary Cardiologist (physician) and Advanced Practice Providers or APPs (Physician Assistants and Nurse Practitioners) who all work together to provide you with the care you need, when you need it.  Your next appointment:   6 month(s)  Provider:   Vina Gull, MD    We recommend signing up for the patient portal called MyChart.  Sign up information is provided on this After Visit Summary.  MyChart is used to connect with patients for Virtual Visits (Telemedicine).  Patients are able to view lab/test results, encounter notes, upcoming appointments, etc.  Non-urgent messages can be sent to your provider as well.   To learn more about what you can do with MyChart, go to [email or web address].               Signed, Vina Gull, MD  04/30/2024 2:06 PM    [facility]

## 2024-04-30 ENCOUNTER — Ambulatory Visit (INDEPENDENT_AMBULATORY_CARE_PROVIDER_SITE_OTHER)

## 2024-04-30 ENCOUNTER — Ambulatory Visit: Attending: Internal Medicine | Admitting: Internal Medicine

## 2024-04-30 ENCOUNTER — Encounter: Payer: Self-pay | Admitting: Internal Medicine

## 2024-04-30 VITALS — BP 120/78 | HR 73 | Ht 68.0 in | Wt 209.0 lb

## 2024-04-30 DIAGNOSIS — I10 Essential (primary) hypertension: Secondary | ICD-10-CM

## 2024-04-30 DIAGNOSIS — Z23 Encounter for immunization: Secondary | ICD-10-CM | POA: Diagnosis not present

## 2024-04-30 DIAGNOSIS — I2510 Atherosclerotic heart disease of native coronary artery without angina pectoris: Secondary | ICD-10-CM

## 2024-04-30 NOTE — Patient Instructions (Signed)
 Medication Instructions:  Your physician recommends that you continue on your current medications as directed. Please refer to the Current Medication list given to you today.  *If you need a refill on your cardiac medications before your next appointment, please call your pharmacy*  Lab Work: NMR Lipoprofile, TSH, A1C, BMET, CBC If you have labs (blood work) drawn today and your tests are completely normal, you will receive your results only by: MyChart Message (if you have MyChart) OR A paper copy in the mail If you have any lab test that is abnormal or we need to change your treatment, we will call you to review the results.  Testing/Procedures: None ordered  Follow-Up: At [facility], you and your health needs are our priority.  As part of our continuing mission to provide you with exceptional heart care, our providers are all part of one team.  This team includes your primary Cardiologist (physician) and Advanced Practice Providers or APPs (Physician Assistants and Nurse Practitioners) who all work together to provide you with the care you need, when you need it.  Your next appointment:   6 month(s)  Provider:   Vina Gull, MD    We recommend signing up for the patient portal called MyChart.  Sign up information is provided on this After Visit Summary.  MyChart is used to connect with patients for Virtual Visits (Telemedicine).  Patients are able to view lab/test results, encounter notes, upcoming appointments, etc.  Non-urgent messages can be sent to your provider as well.   To learn more about what you can do with MyChart, go to [email or web address].

## 2024-04-30 NOTE — Progress Notes (Signed)
 Patient presents to nurse clinic for flu vaccination.   Administered in LD without complication.   Patient scheduled for annual exam on 06/04/24.  Chiquita JAYSON English, RN

## 2024-05-01 LAB — NMR, LIPOPROFILE
Cholesterol, Total: 193 mg/dL (ref 100–199)
HDL Particle Number: 31.4 umol/L (ref >=30.5)
HDL-C: 51 mg/dL (ref >39)
LDL Particle Number: 1074 nmol/L — ABNORMAL HIGH (ref <1000)
LDL Size: 21.2 nm (ref >20.5)
LDL-C (NIH Calc): 119 mg/dL — ABNORMAL HIGH (ref 0–99)
LP-IR Score: 46 — ABNORMAL HIGH (ref <=45)
Small LDL Particle Number: 327 nmol/L (ref <=527)
Triglycerides: 127 mg/dL (ref 0–149)

## 2024-05-01 LAB — BASIC METABOLIC PANEL WITH GFR
BUN/Creatinine Ratio: 11 (ref 10–24)
BUN: 18 mg/dL (ref 8–27)
CO2: 25 mmol/L (ref 20–29)
Calcium: 10.4 mg/dL — ABNORMAL HIGH (ref 8.6–10.2)
Chloride: 99 mmol/L (ref 96–106)
Creatinine, Ser: 1.59 mg/dL — ABNORMAL HIGH (ref 0.76–1.27)
Glucose: 80 mg/dL (ref 70–99)
Potassium: 5.2 mmol/L (ref 3.5–5.2)
Sodium: 138 mmol/L (ref 134–144)
eGFR: 48 mL/min/1.73 — ABNORMAL LOW (ref >59)

## 2024-05-01 LAB — CBC
Hematocrit: 46.2 % (ref 37.5–51.0)
Hemoglobin: 15.5 g/dL (ref 13.0–17.7)
MCH: 31.1 pg (ref 26.6–33.0)
MCHC: 33.5 g/dL (ref 31.5–35.7)
MCV: 93 fL (ref 79–97)
Platelets: 247 x10E3/uL (ref 150–450)
RBC: 4.99 x10E6/uL (ref 4.14–5.80)
RDW: 12.9 % (ref 11.6–15.4)
WBC: 8.1 x10E3/uL (ref 3.4–10.8)

## 2024-05-01 LAB — TSH: TSH: 3.37 u[IU]/mL (ref 0.450–4.500)

## 2024-05-01 LAB — HEMOGLOBIN A1C
Est. average glucose Bld gHb Est-mCnc: 111 mg/dL
Hgb A1c MFr Bld: 5.5 % (ref 4.8–5.6)

## 2024-05-03 ENCOUNTER — Ambulatory Visit: Payer: Self-pay | Admitting: Internal Medicine

## 2024-05-06 NOTE — Telephone Encounter (Signed)
 Pt states he has not been taking his medications as he should and would like to try that before starting a new medication. Advised pt I would send over to Dr Okey for review and next steps.

## 2024-05-11 NOTE — Progress Notes (Signed)
 Read by Lynwood JONELLE Kerns at 6:32PM on 05/07/2024.

## 2024-06-04 ENCOUNTER — Encounter: Payer: Self-pay | Admitting: Family Medicine

## 2024-06-04 ENCOUNTER — Ambulatory Visit: Payer: Self-pay | Admitting: Family Medicine

## 2024-06-04 VITALS — BP 122/79 | HR 63 | Ht 68.0 in | Wt 210.0 lb

## 2024-06-04 DIAGNOSIS — Z1211 Encounter for screening for malignant neoplasm of colon: Secondary | ICD-10-CM

## 2024-06-04 DIAGNOSIS — I10 Essential (primary) hypertension: Secondary | ICD-10-CM | POA: Diagnosis not present

## 2024-06-04 DIAGNOSIS — I2510 Atherosclerotic heart disease of native coronary artery without angina pectoris: Secondary | ICD-10-CM | POA: Diagnosis not present

## 2024-06-04 DIAGNOSIS — E785 Hyperlipidemia, unspecified: Secondary | ICD-10-CM

## 2024-06-04 DIAGNOSIS — Z0000 Encounter for general adult medical examination without abnormal findings: Secondary | ICD-10-CM

## 2024-06-04 DIAGNOSIS — F411 Generalized anxiety disorder: Secondary | ICD-10-CM

## 2024-06-04 DIAGNOSIS — N1831 Chronic kidney disease, stage 3a: Secondary | ICD-10-CM

## 2024-06-04 MED ORDER — ATORVASTATIN CALCIUM 80 MG PO TABS: 80.0000 mg | ORAL_TABLET | Freq: Every evening | ORAL | 0 refills | Status: AC

## 2024-06-04 NOTE — Assessment & Plan Note (Signed)
 Well controlled today. - Continue spironolactone  25 mg, carvedilol  6.25 mg

## 2024-06-04 NOTE — Patient Instructions (Signed)
 It was great to see you today! Thank you for choosing [facility] for your primary care.  Today we addressed: Annual physical exam Great job on staying up to date on all of your health screenings.  Stage 3a chronic kidney disease (HCC) I am referring you to the kidney doctor.  They will call you to schedule an appointment.  Colon cancer screening You will get a call about scheduling your colonoscopy.  If you had a referral placed, they will call you to set up an appointment. Please give us  a call if you don't hear back in the next 2 weeks.  You should return to our clinic in 6 months to 1 year.  Thank you for coming to see us  at [facility] and for the opportunity to care for you! Eon Zunker, MD 06/04/2024, 2:15 PM

## 2024-06-04 NOTE — Assessment & Plan Note (Signed)
 LDL 119 in December, above goal given NSTEMI Hx. - Refilled atorvastatin  80 mg

## 2024-06-04 NOTE — Assessment & Plan Note (Signed)
 Does not follow with Nephrology. - Amb ref to Nephro

## 2024-06-04 NOTE — Assessment & Plan Note (Signed)
 Discussed SSRI today, patient declined.  Has some Xanax  on hand, rarely uses.  Recommended minimizing BZA use given age.
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
## 2024-09-24 ENCOUNTER — Encounter
